# Patient Record
Sex: Male | Born: 1960
Health system: Southern US, Community
[De-identification: ages and names within clinical notes are randomized; demographics above are authoritative.]

## PROBLEM LIST (undated history)

## (undated) DIAGNOSIS — Z8776 Personal history of (corrected) congenital malformations of integument, limbs and musculoskeletal system: Secondary | ICD-10-CM

## (undated) DIAGNOSIS — E785 Hyperlipidemia, unspecified: Secondary | ICD-10-CM

## (undated) DIAGNOSIS — Z122 Encounter for screening for malignant neoplasm of respiratory organs: Secondary | ICD-10-CM

## (undated) DIAGNOSIS — F1010 Alcohol abuse, uncomplicated: Secondary | ICD-10-CM

## (undated) DIAGNOSIS — G4730 Sleep apnea, unspecified: Secondary | ICD-10-CM

## (undated) DIAGNOSIS — E875 Hyperkalemia: Secondary | ICD-10-CM

## (undated) DIAGNOSIS — M109 Gout, unspecified: Secondary | ICD-10-CM

## (undated) DIAGNOSIS — M1611 Unilateral primary osteoarthritis, right hip: Secondary | ICD-10-CM

## (undated) DIAGNOSIS — G4733 Obstructive sleep apnea (adult) (pediatric): Secondary | ICD-10-CM

## (undated) DIAGNOSIS — J439 Emphysema, unspecified: Secondary | ICD-10-CM

## (undated) DIAGNOSIS — K259 Gastric ulcer, unspecified as acute or chronic, without hemorrhage or perforation: Secondary | ICD-10-CM

## (undated) DIAGNOSIS — Z87768 Personal history of other specified (corrected) congenital malformations of integument, limbs and musculoskeletal system: Secondary | ICD-10-CM

## (undated) DIAGNOSIS — I340 Nonrheumatic mitral (valve) insufficiency: Secondary | ICD-10-CM

## (undated) DIAGNOSIS — E119 Type 2 diabetes mellitus without complications: Secondary | ICD-10-CM

## (undated) DIAGNOSIS — G8190 Hemiplegia, unspecified affecting unspecified side: Secondary | ICD-10-CM

## (undated) DIAGNOSIS — I10 Essential (primary) hypertension: Secondary | ICD-10-CM

## (undated) DIAGNOSIS — Z720 Tobacco use: Secondary | ICD-10-CM

## (undated) DIAGNOSIS — I4891 Unspecified atrial fibrillation: Secondary | ICD-10-CM

## (undated) DIAGNOSIS — R7303 Prediabetes: Secondary | ICD-10-CM

## (undated) DIAGNOSIS — M1990 Unspecified osteoarthritis, unspecified site: Secondary | ICD-10-CM

## (undated) DIAGNOSIS — G839 Paralytic syndrome, unspecified: Secondary | ICD-10-CM

## (undated) DIAGNOSIS — I428 Other cardiomyopathies: Secondary | ICD-10-CM

## (undated) DIAGNOSIS — I639 Cerebral infarction, unspecified: Secondary | ICD-10-CM

## (undated) DIAGNOSIS — I2510 Atherosclerotic heart disease of native coronary artery without angina pectoris: Secondary | ICD-10-CM

## (undated) HISTORY — DX: Other cardiomyopathies: I42.8

## (undated) HISTORY — DX: Tobacco use: Z72.0

## (undated) HISTORY — DX: Unilateral primary osteoarthritis, right hip: M16.11

## (undated) HISTORY — DX: Hyperkalemia: E87.5

## (undated) HISTORY — PX: OTHER SURGICAL HISTORY: SHX169

## (undated) HISTORY — DX: Type 2 diabetes mellitus without complications: E11.9

## (undated) HISTORY — PX: TRANSTHORACIC ECHOCARDIOGRAM: SHX275

## (undated) HISTORY — DX: Gastric ulcer, unspecified as acute or chronic, without hemorrhage or perforation: K25.9

## (undated) HISTORY — DX: Alcohol abuse, uncomplicated: F10.10

## (undated) HISTORY — DX: Gout, unspecified: M10.9

## (undated) HISTORY — DX: Atherosclerotic heart disease of native coronary artery without angina pectoris: I25.10

## (undated) HISTORY — PX: TONSILLECTOMY: SUR1361

## (undated) HISTORY — DX: Essential (primary) hypertension: I10

## (undated) HISTORY — DX: Personal history of other specified (corrected) congenital malformations of integument, limbs and musculoskeletal system: Z87.768

## (undated) HISTORY — PX: CARDIOVASCULAR STRESS TEST: SHX262

## (undated) HISTORY — DX: Hyperlipidemia, unspecified: E78.5

## (undated) HISTORY — DX: Unspecified atrial fibrillation: I48.91

## (undated) HISTORY — DX: Unspecified osteoarthritis, unspecified site: M19.90

## (undated) HISTORY — DX: Personal history of (corrected) congenital malformations of integument, limbs and musculoskeletal system: Z87.76

## (undated) HISTORY — DX: Encounter for screening for malignant neoplasm of respiratory organs: Z12.2

## (undated) HISTORY — DX: Obstructive sleep apnea (adult) (pediatric): G47.33

## (undated) HISTORY — DX: Hemiplegia, unspecified affecting unspecified side: G81.90

## (undated) HISTORY — DX: Nonrheumatic mitral (valve) insufficiency: I34.0

## (undated) HISTORY — DX: Emphysema, unspecified: J43.9

## (undated) HISTORY — PX: CARDIOVERSION: SHX1299

---
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
## 2013-01-10 HISTORY — PX: CARDIAC CATHETERIZATION: SHX172

## 2013-02-05 DIAGNOSIS — F1010 Alcohol abuse, uncomplicated: Secondary | ICD-10-CM | POA: Insufficient documentation

## 2013-02-05 DIAGNOSIS — R509 Fever, unspecified: Secondary | ICD-10-CM | POA: Insufficient documentation

## 2013-02-06 DIAGNOSIS — I071 Rheumatic tricuspid insufficiency: Secondary | ICD-10-CM | POA: Insufficient documentation

## 2013-02-06 DIAGNOSIS — I4891 Unspecified atrial fibrillation: Secondary | ICD-10-CM | POA: Insufficient documentation

## 2013-02-06 DIAGNOSIS — I428 Other cardiomyopathies: Secondary | ICD-10-CM | POA: Insufficient documentation

## 2013-02-24 DIAGNOSIS — I10 Essential (primary) hypertension: Secondary | ICD-10-CM | POA: Insufficient documentation

## 2013-02-24 DIAGNOSIS — D62 Acute posthemorrhagic anemia: Secondary | ICD-10-CM | POA: Insufficient documentation

## 2013-02-24 DIAGNOSIS — D649 Anemia, unspecified: Secondary | ICD-10-CM | POA: Insufficient documentation

## 2015-02-12 DIAGNOSIS — M1610 Unilateral primary osteoarthritis, unspecified hip: Secondary | ICD-10-CM | POA: Insufficient documentation

## 2015-03-19 DIAGNOSIS — E785 Hyperlipidemia, unspecified: Secondary | ICD-10-CM | POA: Insufficient documentation

## 2016-07-10 DIAGNOSIS — E119 Type 2 diabetes mellitus without complications: Secondary | ICD-10-CM

## 2016-07-10 DIAGNOSIS — E785 Hyperlipidemia, unspecified: Secondary | ICD-10-CM

## 2016-07-10 HISTORY — DX: Type 2 diabetes mellitus without complications: E11.9

## 2016-07-10 HISTORY — DX: Hyperlipidemia, unspecified: E78.5

## 2016-08-01 ENCOUNTER — Encounter: Payer: Self-pay | Admitting: Family Medicine

## 2016-08-01 ENCOUNTER — Other Ambulatory Visit (INDEPENDENT_AMBULATORY_CARE_PROVIDER_SITE_OTHER): Payer: BLUE CROSS/BLUE SHIELD

## 2016-08-01 ENCOUNTER — Other Ambulatory Visit: Payer: Self-pay | Admitting: Family Medicine

## 2016-08-01 ENCOUNTER — Ambulatory Visit (INDEPENDENT_AMBULATORY_CARE_PROVIDER_SITE_OTHER): Payer: BLUE CROSS/BLUE SHIELD | Admitting: Family Medicine

## 2016-08-01 VITALS — BP 152/92 | HR 78 | Temp 98.6°F | Resp 16 | Ht 67.0 in | Wt 196.5 lb

## 2016-08-01 DIAGNOSIS — Z125 Encounter for screening for malignant neoplasm of prostate: Secondary | ICD-10-CM

## 2016-08-01 DIAGNOSIS — Z0000 Encounter for general adult medical examination without abnormal findings: Secondary | ICD-10-CM

## 2016-08-01 DIAGNOSIS — I10 Essential (primary) hypertension: Secondary | ICD-10-CM

## 2016-08-01 DIAGNOSIS — F1010 Alcohol abuse, uncomplicated: Secondary | ICD-10-CM

## 2016-08-01 LAB — COMPREHENSIVE METABOLIC PANEL
ALT: 26 U/L (ref 0–53)
AST: 30 U/L (ref 0–37)
Albumin: 4.7 g/dL (ref 3.5–5.2)
Alkaline Phosphatase: 74 U/L (ref 39–117)
BUN: 11 mg/dL (ref 6–23)
CO2: 30 mEq/L (ref 19–32)
Calcium: 10.1 mg/dL (ref 8.4–10.5)
Chloride: 97 mEq/L (ref 96–112)
Creatinine, Ser: 0.84 mg/dL (ref 0.40–1.50)
GFR: 100.25 mL/min (ref >60.00)
Glucose, Bld: 139 mg/dL — ABNORMAL HIGH (ref 70–99)
Potassium: 5.1 mEq/L (ref 3.5–5.1)
Sodium: 139 mEq/L (ref 135–145)
Total Bilirubin: 0.9 mg/dL (ref 0.2–1.2)
Total Protein: 7.4 g/dL (ref 6.0–8.3)

## 2016-08-01 LAB — LIPID PANEL
Cholesterol: 207 mg/dL — ABNORMAL HIGH (ref 0–200)
HDL: 55.8 mg/dL (ref >39.00)
LDL Cholesterol: 129 mg/dL — ABNORMAL HIGH (ref 0–99)
NonHDL: 151.62
Total CHOL/HDL Ratio: 4
Triglycerides: 111 mg/dL (ref 0.0–149.0)
VLDL: 22.2 mg/dL (ref 0.0–40.0)

## 2016-08-01 LAB — CBC WITH DIFFERENTIAL/PLATELET
Basophils Absolute: 0 10*3/uL (ref 0.0–0.1)
Basophils Relative: 0.6 % (ref 0.0–3.0)
Eosinophils Absolute: 0.1 10*3/uL (ref 0.0–0.7)
Eosinophils Relative: 0.8 % (ref 0.0–5.0)
HCT: 49.6 % (ref 39.0–52.0)
Hemoglobin: 16.2 g/dL (ref 13.0–17.0)
Lymphocytes Relative: 24.1 % (ref 12.0–46.0)
Lymphs Abs: 1.6 10*3/uL (ref 0.7–4.0)
MCHC: 32.6 g/dL (ref 30.0–36.0)
MCV: 102.1 fl — ABNORMAL HIGH (ref 78.0–100.0)
Monocytes Absolute: 1 10*3/uL (ref 0.1–1.0)
Monocytes Relative: 14.3 % — ABNORMAL HIGH (ref 3.0–12.0)
Neutro Abs: 4.1 10*3/uL (ref 1.4–7.7)
Neutrophils Relative %: 60.2 % (ref 43.0–77.0)
Platelets: 215 10*3/uL (ref 150.0–400.0)
RBC: 4.86 Mil/uL (ref 4.22–5.81)
RDW: 13.5 % (ref 11.5–15.5)
WBC: 6.8 10*3/uL (ref 4.0–10.5)

## 2016-08-01 LAB — TSH: TSH: 1.72 u[IU]/mL (ref 0.35–4.50)

## 2016-08-01 LAB — PSA: PSA: 2.08 ng/mL (ref 0.10–4.00)

## 2016-08-01 MED ORDER — AMLODIPINE BESYLATE 10 MG PO TABS: 10.0000 mg | ORAL_TABLET | Freq: Every day | ORAL | 0 refills | Status: DC

## 2016-08-01 NOTE — Progress Notes (Signed)
Office Note 08/01/2016  CC:  Chief Complaint  Patient presents with  . Establish Care  . Annual Exam    Pt is fasting.     HPI:  Robert [name] is a 56 y.o. male who is here accompanied by wife to establish care Patient's most recent primary MD: Dr. Luetta Nutting at [facility].  Last visit 2017 per pt report today. Old records in EPIC/HL were reviewed prior to or during today's visit.  He has never had a colonoscopy.  He has a long history of alcohol abuse/alcoholism. He denies ever being in an alc rehab facility. He has never really quit alcohol completely. Never had w/drawals. He is not willing to quit drinking alcohol altogether. He wants to try to cut back a little at a time but doesn't want to get to the point of never drinking at all.  Says he drinks alcohol to "keep from going crazy from boredome"  Denies depression.   Denies any use of alcohol so far today.  Denies hx of any liver dysfunction.  He never checks his bp at home.  Denies any other bp meds in the past.  Currently taking toprol xl 100mg  bid and lisinopril40 mg qd. Has f/u with cardiologist in JAN 2018.  Past Medical History:  Diagnosis Date  . Alcohol abuse   . Arthritis   . Atrial fibrillation ([place])    Not candidate for anticoagulation b/c of GI bleeding; had to get transfused.  . Gastric ulcer    hx of  . Hypertension   . Nonischemic cardiomyopathy ([place])    resolved  . Nonrheumatic mitral valve regurgitation   . Osteoarthritis of right hip    Scheduled for surgery 05/11/15 but he then declined to get the surgery.    Past Surgical History:  Procedure Laterality Date  . CARDIOVASCULAR STRESS TEST    . CARDIOVERSION     DC  . TONSILLECTOMY      Family History  Problem Relation Age of Onset  . Diabetes Mother   . Congestive Heart Failure Mother   . Alcohol abuse Father   . Hypertension Father   . Stroke Father   . Heart disease Paternal Grandfather     Social History   Social History  .  Marital status: Married    Spouse name: N/A  . Number of children: N/A  . Years of education: N/A   Occupational History  . Not on file.   Social History Main Topics  . Smoking status: Former Smoker    Packs/day: 1.00    Years: 5.00    Types: Cigarettes    Quit date: 01/10/2013  . Smokeless tobacco: Never Used  . Alcohol use Yes     Comment: drinks liquor daily  . Drug use: No  . Sexual activity: Not on file   Other Topics Concern  . Not on file   Social History Narrative   Married, 2 adult children.   Educ: 11th grade   Occup: "out of work".  Worked for [employer]--was let go for not abiding by [employer] per pt/wife.   Tob: former--quit 2014.  "Of and on prior".   Alc: currently drinks a pint and 1 and 1/2 pint of whiskey per day x years---long hx of alcohol abuse.   Drugs: none.   MEDS: pt not taking amlodipine listed below--this med was added at today's visit. Outpatient Encounter Prescriptions as of 08/01/2016  Medication Sig  . digoxin (LANOXIN) 0.125 MG tablet Take 1 tablet by  mouth daily.  Marland Kitchen lisinopril (PRINIVIL,ZESTRIL) 40 MG tablet Take 1 tablet by mouth daily.  . metoprolol succinate (TOPROL-XL) 100 MG 24 hr tablet Take 1 tablet by mouth 2 (two) times daily.  Marland Kitchen amLODipine (NORVASC) 10 MG tablet Take 1 tablet (10 mg total) by mouth daily.   No facility-administered encounter medications on file as of 08/01/2016.     No Known Allergies  ROS Review of Systems  Constitutional: Negative for fatigue and fever.  HENT: Negative for congestion and sore throat.   Eyes: Negative for visual disturbance.  Respiratory: Negative for cough.   Cardiovascular: Negative for chest pain.  Gastrointestinal: Negative for abdominal pain and nausea.  Genitourinary: Negative for dysuria.  Musculoskeletal: Positive for arthralgias (chronic severe R hip pain). Negative for back pain and joint swelling.  Skin: Negative for rash.  Neurological: Negative for weakness and  headaches.       Hearing impairment   Hematological: Negative for adenopathy.    PE; Blood pressure (!) 152/92, pulse 78, temperature 98.6 F (37 C), temperature source Oral, resp. rate 16, height 5\' 7"  (1.702 m), weight 196 lb 8 oz (89.1 kg), SpO2 98 %.BP recheck manually 152/92 Gen: Alert, well appearing.  Patient is oriented to person, place, time, and situation. AFFECT: pleasant, lucid thought and speech. OEV:OJJK: no injection, icteris, swelling, or exudate.  EOMI, PERRLA. Mouth: lips without lesion/swelling.  Oral mucosa pink and moist. Oropharynx without erythema, exudate, or swelling.  CV: Irreg irreg, no m/r/g.   LUNGS: CTA bilat, nonlabored resps, good aeration in all lung fields. EXT: digital clubbing, no cyanosis, 1-2 + pitting L LE, no edema R LE  Pertinent labs:  none  ASSESSMENT AND PLAN:   New pt; obtain pertinent old records.  1) Uncontrolled HTN: add amlodipine 10mg  qd. Checking BMET and lipid panel today (fasting).  2) Alcoholism: pt with poor insight into his problem.  Refuses to quit, but agrees to try to cut back. Offered/encouraged help with quitting today, including getting him into rehab facility, referring him to psychiatrist, etc. He declined ANY such help today. Check CBC and CMET today (fasting).  3) Chronic R hip pain/severe osteoarthritis: disabled.  Hip replacement was recommended but he did not go through with this.  I wrote a letter today to excuse him from jury duty.  4) A-fib: vent response normal.  Not a candidate for anticoagulation b/c of past hx of severe GI bleed on xarelto PLUS pt with severe alcohol abuse and at risk for falls.  An After Visit Summary was printed and given to the patient.  Return in about 2 weeks (around 08/15/2016) for CPE and f/u HTN. (CPE labs done today).  Signed:  Crissie Sickles, MD           08/01/2016

## 2016-08-02 ENCOUNTER — Other Ambulatory Visit: Payer: Self-pay | Admitting: *Deleted

## 2016-08-02 ENCOUNTER — Encounter: Payer: Self-pay | Admitting: Family Medicine

## 2016-08-02 ENCOUNTER — Other Ambulatory Visit (INDEPENDENT_AMBULATORY_CARE_PROVIDER_SITE_OTHER): Payer: BLUE CROSS/BLUE SHIELD

## 2016-08-02 DIAGNOSIS — R739 Hyperglycemia, unspecified: Secondary | ICD-10-CM

## 2016-08-02 LAB — HEMOGLOBIN A1C: Hgb A1c MFr Bld: 6.2 % (ref 4.6–6.5)

## 2016-08-08 ENCOUNTER — Encounter: Payer: Self-pay | Admitting: Family Medicine

## 2016-08-08 ENCOUNTER — Other Ambulatory Visit (INDEPENDENT_AMBULATORY_CARE_PROVIDER_SITE_OTHER): Payer: BLUE CROSS/BLUE SHIELD

## 2016-08-08 DIAGNOSIS — R739 Hyperglycemia, unspecified: Secondary | ICD-10-CM | POA: Diagnosis not present

## 2016-08-08 LAB — GLUCOSE, RANDOM: Glucose, Bld: 137 mg/dL — ABNORMAL HIGH (ref 70–99)

## 2016-08-09 ENCOUNTER — Other Ambulatory Visit: Payer: Self-pay | Admitting: *Deleted

## 2016-08-09 MED ORDER — METFORMIN HCL 500 MG PO TABS: 500.0000 mg | ORAL_TABLET | Freq: Every day | ORAL | 0 refills | Status: DC

## 2016-08-15 ENCOUNTER — Ambulatory Visit (INDEPENDENT_AMBULATORY_CARE_PROVIDER_SITE_OTHER): Payer: BLUE CROSS/BLUE SHIELD | Admitting: Family Medicine

## 2016-08-15 ENCOUNTER — Encounter: Payer: Self-pay | Admitting: Family Medicine

## 2016-08-15 VITALS — BP 109/71 | HR 67 | Temp 98.1°F | Resp 16 | Ht 67.0 in | Wt 195.0 lb

## 2016-08-15 DIAGNOSIS — R683 Clubbing of fingers: Secondary | ICD-10-CM | POA: Diagnosis not present

## 2016-08-15 DIAGNOSIS — Z1211 Encounter for screening for malignant neoplasm of colon: Secondary | ICD-10-CM

## 2016-08-15 DIAGNOSIS — Z87891 Personal history of nicotine dependence: Secondary | ICD-10-CM

## 2016-08-15 DIAGNOSIS — E118 Type 2 diabetes mellitus with unspecified complications: Secondary | ICD-10-CM

## 2016-08-15 DIAGNOSIS — Z0000 Encounter for general adult medical examination without abnormal findings: Secondary | ICD-10-CM

## 2016-08-15 DIAGNOSIS — E119 Type 2 diabetes mellitus without complications: Secondary | ICD-10-CM | POA: Diagnosis not present

## 2016-08-15 DIAGNOSIS — Z125 Encounter for screening for malignant neoplasm of prostate: Secondary | ICD-10-CM

## 2016-08-15 MED ORDER — BLOOD GLUCOSE METER KIT: PACK | 0 refills | Status: DC

## 2016-08-15 NOTE — Patient Instructions (Signed)

## 2016-08-15 NOTE — Progress Notes (Signed)
Office Note 08/15/2016  CC:  Chief Complaint  Patient presents with  . Annual Exam    Pt is fasting.   . Follow-up    HTN    HPI:  Robert [name] is a 56 y.o. male who is here for f/u HTN, recent dx of DM 2 at most recent visit 07/2016, as well as annual health maintenance exam. After labs last visit, I recommended nutritionist referral but he declined. I also started him on metformin 500 mg qhs.   I also added amlodipine to his bp med regimen last visit. No home bp checks have been done.  Needs equipment for home glucose monitoring.  Taking metformin every night w/out side effect.  Eyes:  approx 2 yrs ago. Dental: no preventatives. Exercise: no exercise, esp with R hip pain. Diet: trying to watch carbs some.   Past Medical History:  Diagnosis Date  . Alcohol abuse   . Arthritis   . Atrial fibrillation ([place])    Not candidate for anticoagulation b/c of GI bleeding; had to get transfused.  Holding off on ASA until patient quits drinking.  . Gastric ulcer    hx of  . Hyperlipidemia 07/2016   Holding off on statin until pt quits drinking.  Marland Kitchen Hypertension   . Nonischemic cardiomyopathy ([place])    resolved  . Nonrheumatic mitral valve regurgitation   . Osteoarthritis of right hip    Scheduled for surgery 05/11/15 but he then declined to get the surgery.  . Type II diabetes mellitus ([place]) 07/2016   Dx'd by two fasting glucoses > 126 (Hb a1c 6.2% at that time).  Holding off on ASA until patient quits drinking.    Past Surgical History:  Procedure Laterality Date  . CARDIOVASCULAR STRESS TEST    . CARDIOVERSION     DC  . TONSILLECTOMY      Family History  Problem Relation Age of Onset  . Diabetes Mother   . Congestive Heart Failure Mother   . Alcohol abuse Father   . Hypertension Father   . Stroke Father   . Heart disease Paternal Grandfather     Social History   Social History  . Marital status: Married    Spouse name: N/A  . Number of children: N/A  .  Years of education: N/A   Occupational History  . Not on file.   Social History Main Topics  . Smoking status: Former Smoker    Packs/day: 1.00    Years: 5.00    Types: Cigarettes    Quit date: 01/10/2013  . Smokeless tobacco: Never Used  . Alcohol use Yes     Comment: drinks liquor daily  . Drug use: No  . Sexual activity: Not on file   Other Topics Concern  . Not on file   Social History Narrative   Married, 2 adult children.   Educ: 11th grade   Occup: "out of work".  Worked for [employer]--was let go for not abiding by [employer] per pt/wife.   Tob: former--quit 2014.  "Of and on prior".   Alc: currently drinks a pint and 1 and 1/2 pint of whiskey per day x years---long hx of alcohol abuse.   Drugs: none.    Outpatient Medications Prior to Visit  Medication Sig Dispense Refill  . amLODipine (NORVASC) 10 MG tablet Take 1 tablet (10 mg total) by mouth daily. 30 tablet 0  . digoxin (LANOXIN) 0.125 MG tablet Take 1 tablet by mouth daily.    Marland Kitchen  lisinopril (PRINIVIL,ZESTRIL) 40 MG tablet Take 1 tablet by mouth daily.    . metFORMIN (GLUCOPHAGE) 500 MG tablet Take 1 tablet (500 mg total) by mouth at bedtime. 90 tablet 0  . metoprolol succinate (TOPROL-XL) 100 MG 24 hr tablet Take 1 tablet by mouth 2 (two) times daily.     No facility-administered medications prior to visit.     No Known Allergies  ROS Review of Systems  Constitutional: Negative for appetite change, chills, fatigue and fever.  HENT: Negative for congestion, dental problem, ear pain and sore throat.   Eyes: Negative for discharge, redness and visual disturbance.  Respiratory: Negative for cough, chest tightness, shortness of breath and wheezing.   Cardiovascular: Negative for chest pain, palpitations and leg swelling.  Gastrointestinal: Positive for constipation (chronic). Negative for abdominal pain, blood in stool, diarrhea, nausea and vomiting.  Genitourinary: Negative for difficulty urinating,  dysuria, flank pain, frequency, hematuria and urgency.  Musculoskeletal: Negative for arthralgias, back pain, joint swelling, myalgias and neck stiffness.  Skin: Negative for pallor and rash.  Neurological: Negative for dizziness, speech difficulty, weakness and headaches.  Hematological: Negative for adenopathy. Does not bruise/bleed easily.  Psychiatric/Behavioral: Negative for confusion and sleep disturbance. The patient is not nervous/anxious.     PE; Blood pressure 109/71, pulse 67, temperature 98.1 F (36.7 C), temperature source Oral, resp. rate 16, height 5\' 7"  (1.702 m), weight 195 lb (88.5 kg), SpO2 98 %. Gen: Alert, well appearing.  Patient is oriented to person, place, time, and situation. AFFECT: pleasant, lucid thought and speech. ENT: Ears: EACs clear, normal epithelium.  TMs with good light reflex and landmarks bilaterally.  Eyes: no injection, icteris, swelling, or exudate.  EOMI, PERRLA. Nose: no drainage or turbinate edema/swelling.  No injection or focal lesion.  Mouth: lips without lesion/swelling.  Oral mucosa pink and moist.  Dentition intact and without obvious caries or gingival swelling.  Oropharynx without erythema, exudate, or swelling.  Neck: supple/nontender.  No LAD, mass, or TM.  Carotid pulses 2+ bilaterally, without bruits. CV: RRR, no m/r/g.   LUNGS: CTA bilat, nonlabored resps, good aeration in all lung fields. ABD: soft, NT, ND, BS normal.  No hepatospenomegaly or mass.  No bruits. EXT: Digital clubbing on all fingers.  Clubbing,No cyanosis or edema.  Musculoskeletal: no joint swelling, erythema, warmth, or tenderness.  ROM of all joints intact. Skin - no sores or suspicious lesions or rashes or color changes   Pertinent labs:  Lab Results  Component Value Date   TSH 1.72 08/01/2016   Lab Results  Component Value Date   WBC 6.8 08/01/2016   HGB 16.2 08/01/2016   HCT 49.6 08/01/2016   MCV 102.1 (H) 08/01/2016   PLT 215.0 08/01/2016   Lab  Results  Component Value Date   CREATININE 0.84 08/01/2016   BUN 11 08/01/2016   NA 139 08/01/2016   K 5.1 08/01/2016   CL 97 08/01/2016   CO2 30 08/01/2016   Lab Results  Component Value Date   ALT 26 08/01/2016   AST 30 08/01/2016   ALKPHOS 74 08/01/2016   BILITOT 0.9 08/01/2016   Lab Results  Component Value Date   CHOL 207 (H) 08/01/2016   Lab Results  Component Value Date   HDL 55.80 08/01/2016   Lab Results  Component Value Date   LDLCALC 129 (H) 08/01/2016   Lab Results  Component Value Date   TRIG 111.0 08/01/2016   Lab Results  Component Value Date   CHOLHDL 4  08/01/2016   Lab Results  Component Value Date   PSA 2.08 08/01/2016   Lab Results  Component Value Date   HGBA1C 6.2 08/02/2016     ASSESSMENT AND PLAN:   1) DM 2; new dx.  Tolerating low dose metformin. He declined nutritionist referral. Discussed diet minimally today. Rx's given for glucometer and strips/supplies today and CMA Helayne Seminole taught him how to check his glucose while in room today.  2) HTN: improved since addition of amlodipine last visit--normal here today. Discussed home monitoring--will likely tackle this at future visit b/c he has a lot of new things on his plate right now.  3) Health maintenance exam:  Reviewed age and gender appropriate health maintenance issues (prudent diet, regular exercise, health risks of tobacco and excessive alcohol, use of seatbelts, fire alarms in home, use of sunscreen).  Also reviewed age and gender appropriate health screening as well as vaccine recommendations. Encouraged pt to completely quit drinking alcohol. Vaccines: needs Tdap--declines this today. Labs: done at last visit--see above.  Hep C and HIV screening--he declined these today. Prostate ca screening: PSA normal recently.  He wants to defer the DRE at this time. Colon ca screening: pt wants to wait on doing anything regarding colon ca screening at this time.  An After  Visit Summary was printed and given to the patient.  FOLLOW UP:  Return in about 3 months (around 11/15/2016) for routine chronic illness f/u (fasting).  Signed:  Crissie Sickles, MD           08/15/2016

## 2016-08-20 ENCOUNTER — Ambulatory Visit (HOSPITAL_BASED_OUTPATIENT_CLINIC_OR_DEPARTMENT_OTHER)
Admission: RE | Admit: 2016-08-20 | Discharge: 2016-08-20 | Disposition: A | Discharge status: Home / Self Care | Payer: BLUE CROSS/BLUE SHIELD | Source: Ambulatory Visit | Attending: Family Medicine | Admitting: Family Medicine

## 2016-08-20 DIAGNOSIS — Z87891 Personal history of nicotine dependence: Secondary | ICD-10-CM | POA: Insufficient documentation

## 2016-08-20 DIAGNOSIS — R683 Clubbing of fingers: Secondary | ICD-10-CM | POA: Insufficient documentation

## 2016-08-21 ENCOUNTER — Encounter: Payer: Self-pay | Admitting: Family Medicine

## 2016-08-28 ENCOUNTER — Other Ambulatory Visit: Payer: Self-pay | Admitting: Family Medicine

## 2016-08-29 NOTE — Telephone Encounter (Signed)
[facility] [address] 

## 2016-10-07 ENCOUNTER — Other Ambulatory Visit: Payer: Self-pay | Admitting: *Deleted

## 2016-10-07 MED ORDER — GLUCOSE BLOOD VI STRP: ORAL_STRIP | 12 refills | Status: DC

## 2016-11-05 ENCOUNTER — Other Ambulatory Visit: Payer: Self-pay | Admitting: Family Medicine

## 2016-11-15 ENCOUNTER — Ambulatory Visit: Payer: BLUE CROSS/BLUE SHIELD | Admitting: Family Medicine

## 2016-11-15 ENCOUNTER — Other Ambulatory Visit: Payer: Self-pay | Admitting: *Deleted

## 2016-11-15 MED ORDER — MICROLET LANCETS MISC: 11 refills | Status: DC

## 2017-01-20 ENCOUNTER — Encounter: Payer: Self-pay | Admitting: Family Medicine

## 2017-01-20 ENCOUNTER — Ambulatory Visit: Payer: BLUE CROSS/BLUE SHIELD | Admitting: Family Medicine

## 2017-01-20 VITALS — BP 120/79 | HR 66 | Temp 98.2°F | Resp 16 | Wt 186.0 lb

## 2017-01-20 DIAGNOSIS — F1010 Alcohol abuse, uncomplicated: Secondary | ICD-10-CM | POA: Diagnosis not present

## 2017-01-20 DIAGNOSIS — I10 Essential (primary) hypertension: Secondary | ICD-10-CM

## 2017-01-20 DIAGNOSIS — E7800 Pure hypercholesterolemia, unspecified: Secondary | ICD-10-CM

## 2017-01-20 DIAGNOSIS — Z23 Encounter for immunization: Secondary | ICD-10-CM

## 2017-01-20 DIAGNOSIS — E118 Type 2 diabetes mellitus with unspecified complications: Secondary | ICD-10-CM | POA: Diagnosis not present

## 2017-01-20 LAB — BASIC METABOLIC PANEL
BUN: 19 mg/dL (ref 6–23)
CO2: 31 mEq/L (ref 19–32)
Calcium: 9.9 mg/dL (ref 8.4–10.5)
Chloride: 98 mEq/L (ref 96–112)
Creatinine, Ser: 0.91 mg/dL (ref 0.40–1.50)
GFR: 91.25 mL/min (ref >60.00)
Glucose, Bld: 131 mg/dL — ABNORMAL HIGH (ref 70–99)
Potassium: 5.1 mEq/L (ref 3.5–5.1)
Sodium: 139 mEq/L (ref 135–145)

## 2017-01-20 LAB — MICROALBUMIN / CREATININE URINE RATIO
Creatinine,U: 77.2 mg/dL
Microalb Creat Ratio: 1.8 mg/g (ref 0.0–30.0)
Microalb, Ur: 1.4 mg/dL (ref 0.0–1.9)

## 2017-01-20 LAB — HEMOGLOBIN A1C: Hgb A1c MFr Bld: 6.1 % (ref 4.6–6.5)

## 2017-01-20 NOTE — Progress Notes (Signed)
OFFICE VISIT  01/20/2017   CC:  Chief Complaint  Patient presents with  . Follow-up    RCI   HPI:    Patient is a 57 y.o.  male who presents for 5 mo f/u DM 2, HTN, HLD, alcohol abuse.  He is by himself today.  Noncompliant with home gluc monitoring: "normal" per pt. Denies burning,tingling, or numbness in feet.  Denies ulcers. He refused feet exam today b/c he said it was too difficult and painful to take his shoes and socks off due to chronic hip pain.  He did not even want me to assist him.  HTN: no home bp monitoring.  Reports good compliance with bp meds.  Alcohol: none during the week.  Drinks 2 pints of canadian mist on weekends. Drinks lots of orange juice and water.  NO sodas. No exercise. R hip pain prevents much activity at all.  He is resistant to the idea of hip replacement surgery.  HLD: he is not really interested in taking statin, and I told him we really can't even safely give him this med anyway since he drinks too much alcohol.  If he ever quits alcohol completely then we could offer statin AND offer anticoag for his a-fib.  ROS: +LE swelling--chronic.  NO CP, no SOB, no melena or hematochezia, no HAs, no palpitations, no heart racing, no dizziness.  No focal weakness.  Past Medical History:  Diagnosis Date  . Alcohol abuse    ongoing as of 01/2017  . Arthritis   . Atrial fibrillation ([place])    Not candidate for anticoagulation b/c of GI bleeding; had to get transfused.  Holding off on ASA until patient quits drinking.  . Gastric ulcer    hx of  . History of digital clubbing    "all my adult life"--CXR 08/2016 = bronchitic changes o/w normal.  . Hyperlipidemia 07/2016   Holding off on statin until pt quits drinking.  Marland Kitchen Hypertension   . Nonischemic cardiomyopathy ([place])    resolved  . Nonrheumatic mitral valve regurgitation   . Osteoarthritis of right hip    Scheduled for surgery 05/11/15 but he then declined to get the surgery.  . Tobacco abuse    Quit  2014  . Type II diabetes mellitus ([place]) 07/2016   Dx'd by two fasting glucoses > 126 (Hb a1c 6.2% at that time).  Holding off on ASA until patient quits drinking.    Past Surgical History:  Procedure Laterality Date  . CARDIOVASCULAR STRESS TEST    . CARDIOVERSION     DC  . TONSILLECTOMY      Outpatient Medications Prior to Visit  Medication Sig Dispense Refill  . amLODipine (NORVASC) 10 MG tablet TAKE 1 TABLET BY MOUTH EVERY DAY 30 tablet 5  . blood glucose meter kit and supplies Use to check blood sugar once daily. 1 each 0  . digoxin (LANOXIN) 0.125 MG tablet Take 1 tablet by mouth daily.    Marland Kitchen glucose blood (CONTOUR NEXT TEST) test strip Use to check blood sugar once daily. 100 each 12  . lisinopril (PRINIVIL,ZESTRIL) 40 MG tablet Take 1 tablet by mouth daily.    . metFORMIN (GLUCOPHAGE) 500 MG tablet TAKE 1 TABLET AT BEDTIME 90 tablet 0  . metoprolol succinate (TOPROL-XL) 100 MG 24 hr tablet Take 1 tablet by mouth 2 (two) times daily.    Marland Kitchen MICROLET LANCETS MISC Use to check blood sugar once daily 100 each 11  . pantoprazole (PROTONIX) 40 MG tablet  Take 40 mg by mouth 2 (two) times daily.  0   No facility-administered medications prior to visit.     No Known Allergies  ROS As per HPI  PE: Blood pressure 120/79, pulse 66, temperature 98.2 F (36.8 C), temperature source Oral, resp. rate 16, weight 186 lb (84.4 kg), SpO2 98 %. Gen: Alert, well appearing.  Patient is oriented to person, place, time, and situation. AFFECT: pleasant, lucid thought and speech. CV: RRR, no m/r/g.   LUNGS: CTA bilat, nonlabored resps, good aeration in all lung fields. EXT: 3+ pitting edema L LL and 2+ pitting edema R LL. Digital clubbing on all fingers.  NO cyanosis.  LABS:  Lab Results  Component Value Date   TSH 1.72 08/01/2016   Lab Results  Component Value Date   WBC 6.8 08/01/2016   HGB 16.2 08/01/2016   HCT 49.6 08/01/2016   MCV 102.1 (H) 08/01/2016   PLT 215.0 08/01/2016    Lab Results  Component Value Date   CREATININE 0.84 08/01/2016   BUN 11 08/01/2016   NA 139 08/01/2016   K 5.1 08/01/2016   CL 97 08/01/2016   CO2 30 08/01/2016   Lab Results  Component Value Date   ALT 26 08/01/2016   AST 30 08/01/2016   ALKPHOS 74 08/01/2016   BILITOT 0.9 08/01/2016   Lab Results  Component Value Date   CHOL 207 (H) 08/01/2016   Lab Results  Component Value Date   HDL 55.80 08/01/2016   Lab Results  Component Value Date   LDLCALC 129 (H) 08/01/2016   Lab Results  Component Value Date   TRIG 111.0 08/01/2016   Lab Results  Component Value Date   CHOLHDL 4 08/01/2016   Lab Results  Component Value Date   PSA 2.08 08/01/2016   Lab Results  Component Value Date   HGBA1C 6.2 08/02/2016    IMPRESSION AND PLAN:  1) DM 2; no home monitoring. Reports compliance with metformin but sounds like diet not so good. He denied any feet symptoms but he refused feet exam today. HbA1c and urine microalb/cr today. Reminded pt of need for diab retpthy screening exam. Needs flu and pneumovax: he was agreeable to getting these but not today. He got shingrix #1 today and when he returns in 2 mo for shingrix #2 he said he would get flu vaccine at that time.  This is the best I could get him to do.  He will consider pneumovax 23 at a future office f/u visit.  2) HTN: The current medical regimen is effective;  continue present plan and medications. Lytes/cr today.  3) HLD: LDL 129 July 2018, plus pt is diabetic: LDL goal <70/statin indicated. However, statin risk outweighs benefits since he abuses alcohol.  4) Alcohol abuse: he abuses alc on weekends but states his wife won't let him drink during the week. Encouraged pt to quit drinking completely.  5) Digital clubbing: CXR last visit showed some chronic bronchitic changes but o/w normal.  An After Visit Summary was printed and given to the patient.  FOLLOW UP: Return in about 3 months (around 04/20/2017)  for routine chronic illness f/u  --nurse visit in 2 months to get shingrix #2 AND flu vaccine.  Signed:  Crissie Sickles, MD           01/20/2017

## 2017-02-08 ENCOUNTER — Other Ambulatory Visit: Payer: Self-pay | Admitting: Family Medicine

## 2017-03-20 ENCOUNTER — Ambulatory Visit (INDEPENDENT_AMBULATORY_CARE_PROVIDER_SITE_OTHER): Payer: BLUE CROSS/BLUE SHIELD

## 2017-03-20 DIAGNOSIS — Z23 Encounter for immunization: Secondary | ICD-10-CM

## 2017-03-20 NOTE — Progress Notes (Signed)
Patient presents today for #2 shingles vaccine and influenza vaccine. Given with no problems or incidence. Patient left with no complaints.

## 2017-04-10 DIAGNOSIS — E875 Hyperkalemia: Secondary | ICD-10-CM

## 2017-04-10 HISTORY — DX: Hyperkalemia: E87.5

## 2017-04-20 ENCOUNTER — Ambulatory Visit: Payer: BLUE CROSS/BLUE SHIELD | Admitting: Family Medicine

## 2017-04-20 ENCOUNTER — Encounter: Payer: Self-pay | Admitting: Family Medicine

## 2017-04-20 VITALS — BP 120/80 | HR 61 | Temp 97.5°F | Resp 16 | Ht 67.0 in | Wt 182.0 lb

## 2017-04-20 DIAGNOSIS — I10 Essential (primary) hypertension: Secondary | ICD-10-CM | POA: Diagnosis not present

## 2017-04-20 DIAGNOSIS — Z9181 History of falling: Secondary | ICD-10-CM | POA: Diagnosis not present

## 2017-04-20 DIAGNOSIS — E7800 Pure hypercholesterolemia, unspecified: Secondary | ICD-10-CM | POA: Diagnosis not present

## 2017-04-20 DIAGNOSIS — F1010 Alcohol abuse, uncomplicated: Secondary | ICD-10-CM | POA: Diagnosis not present

## 2017-04-20 DIAGNOSIS — E118 Type 2 diabetes mellitus with unspecified complications: Secondary | ICD-10-CM

## 2017-04-20 LAB — BASIC METABOLIC PANEL
BUN: 15 mg/dL (ref 6–23)
CO2: 33 mEq/L — ABNORMAL HIGH (ref 19–32)
Calcium: 9.6 mg/dL (ref 8.4–10.5)
Chloride: 100 mEq/L (ref 96–112)
Creatinine, Ser: 1.19 mg/dL (ref 0.40–1.50)
GFR: 66.89 mL/min (ref >60.00)
Glucose, Bld: 110 mg/dL — ABNORMAL HIGH (ref 70–99)
Potassium: 4.5 mEq/L (ref 3.5–5.1)
Sodium: 141 mEq/L (ref 135–145)

## 2017-04-20 LAB — HEMOGLOBIN A1C: Hgb A1c MFr Bld: 5.9 % (ref 4.6–6.5)

## 2017-04-20 NOTE — Progress Notes (Signed)
OFFICE VISIT  04/20/2017   CC:  Chief Complaint  Patient presents with  . Follow-up    RCI   HPI:    Patient is a 57 y.o. Caucasian male who presents unaccompanied today for 3 mo f/u DM 2, HTN, and alcohol abuse. Stopped amlodipine 1 mo ago b/c he ran out and didn't ask pharmacy for refill.  DM 2: has glucometer at home but doesn't check glucose. Has made some dietary adjustments. Says he has no feet burning, tingling, or numbness.  No hx of ulcer. Declined feet exam again today.  HTN: no home bp checks.  Compliant with lisin and toprol  Alcohol: drinks 1-2 pints of bourbon on w/e's, none weekdays.  He is a bit evasive about this topic. He states again that he realizes this is too much alcohol but says he'll never completely quit--does not want to. He has a-fib --not anticoag candidate due to unpredictable alcohol abuse+ fall risk.   Hx of hyperlipidemia---waiting on pt to stop drinking before putting him on a statin-->risk of liver injury. ROS: no palpitations, SOB, CP, no falls per pt.  Past Medical History:  Diagnosis Date  . Alcohol abuse    ongoing as of 01/2017  . Arthritis   . Atrial fibrillation ([place])    Not candidate for anticoagulation b/c of GI bleeding; had to get transfused.  Holding off on ASA until patient quits drinking.  . Gastric ulcer    hx of  . History of digital clubbing    "all my adult life"--CXR 08/2016 = bronchitic changes o/w normal.  . Hyperlipidemia 07/2016   Holding off on statin until pt quits drinking.  Marland Kitchen Hypertension   . Nonischemic cardiomyopathy ([place])    resolved  . Nonrheumatic mitral valve regurgitation   . Osteoarthritis of right hip    Scheduled for surgery 05/11/15 but he then declined to get the surgery.  . Tobacco abuse    Quit 2014  . Type II diabetes mellitus ([place]) 07/2016   Dx'd by two fasting glucoses > 126 (Hb a1c 6.2% at that time).  Holding off on ASA until patient quits drinking.    Past Surgical History:  Procedure  Laterality Date  . CARDIOVASCULAR STRESS TEST    . CARDIOVERSION     DC  . TONSILLECTOMY      Outpatient Medications Prior to Visit  Medication Sig Dispense Refill  . blood glucose meter kit and supplies Use to check blood sugar once daily. 1 each 0  . digoxin (LANOXIN) 0.125 MG tablet Take 1 tablet by mouth daily.    Marland Kitchen glucose blood (CONTOUR NEXT TEST) test strip Use to check blood sugar once daily. 100 each 12  . lisinopril (PRINIVIL,ZESTRIL) 40 MG tablet Take 1 tablet by mouth daily.    . metFORMIN (GLUCOPHAGE) 500 MG tablet TAKE 1 TABLET AT BEDTIME 90 tablet 1  . metoprolol succinate (TOPROL-XL) 100 MG 24 hr tablet Take 1 tablet by mouth 2 (two) times daily.    Marland Kitchen MICROLET LANCETS MISC Use to check blood sugar once daily 100 each 11  . pantoprazole (PROTONIX) 40 MG tablet Take 40 mg by mouth 2 (two) times daily.  0  . amLODipine (NORVASC) 10 MG tablet TAKE 1 TABLET BY MOUTH EVERY DAY (Patient not taking: Reported on 04/20/2017) 30 tablet 5   No facility-administered medications prior to visit.     No Known Allergies  ROS As per HPI  PE: Blood pressure 120/80, pulse 61, temperature (!) 97.5 F (  36.4 C), temperature source Oral, resp. rate 16, height _0  (1.702 m), weight 182 lb (82.6 kg), SpO2 98 %. Gen: Alert, well appearing.  Patient is oriented to person, place, time, and situation. AFFECT: pleasant, lucid thought and speech. CV: irreg irreg, no m/r Chest is clear, no wheezing or rales. Normal symmetric air entry throughout both lung fields. No chest wall deformities or tenderness. EXT: no cyanosis.  He has 1+ pitting edema in RLL, 2+ in L LL.  LABS:  Lab Results  Component Value Date   TSH 1.72 08/01/2016   Lab Results  Component Value Date   WBC 6.8 08/01/2016   HGB 16.2 08/01/2016   HCT 49.6 08/01/2016   MCV 102.1 (H) 08/01/2016   PLT 215.0 08/01/2016   Lab Results  Component Value Date   CREATININE 0.91 01/20/2017   BUN 19 01/20/2017   NA 139 01/20/2017    K 5.1 01/20/2017   CL 98 01/20/2017   CO2 31 01/20/2017   Lab Results  Component Value Date   ALT 26 08/01/2016   AST 30 08/01/2016   ALKPHOS 74 08/01/2016   BILITOT 0.9 08/01/2016   Lab Results  Component Value Date   CHOL 207 (H) 08/01/2016   Lab Results  Component Value Date   HDL 55.80 08/01/2016   Lab Results  Component Value Date   LDLCALC 129 (H) 08/01/2016   Lab Results  Component Value Date   TRIG 111.0 08/01/2016   Lab Results  Component Value Date   CHOLHDL 4 08/01/2016   Lab Results  Component Value Date   PSA 2.08 08/01/2016   Lab Results  Component Value Date   HGBA1C 6.1 01/20/2017    IMPRESSION AND PLAN:  1) DM 2, historically well controlled.  Diet improved.   Compliant with metformin. HbA1c today. He declined feet exam today---he is asymptomatic.  2) HTN: seems to be fine w/out amlodipine at this time. Continue lisinopril and toprol at current doses. BMET today.  3) Alc abuse/alcoholism--pt not willing to quit.  He admits only to weekend drinking. Will continue to hold off on anticoag for his a-fib as well as statin for his hypercholesterolemia until I feel like he is no longer abusing alcohol and a high fall risk. Unfortunately he tells me today he'll never quit.  An After Visit Summary was printed and given to the patient.  FOLLOW UP: No follow-ups on file.  Signed:  Crissie Sickles, MD           04/23/2017

## 2017-04-23 ENCOUNTER — Encounter: Payer: Self-pay | Admitting: Family Medicine

## 2017-04-24 ENCOUNTER — Other Ambulatory Visit: Payer: Self-pay | Admitting: *Deleted

## 2017-04-24 DIAGNOSIS — N182 Chronic kidney disease, stage 2 (mild): Secondary | ICD-10-CM

## 2017-05-04 ENCOUNTER — Telehealth: Payer: Self-pay | Admitting: Family Medicine

## 2017-05-04 NOTE — Telephone Encounter (Signed)
Patient questioning directions from previous lab results. Labs were reiterated and gone over. Patient voiced understanding.

## 2017-05-04 NOTE — Telephone Encounter (Signed)
Copied from [place] [phone]. Topic: Quick Communication - See Telephone Encounter >> May 04, 2017  3:34 PM Ether Griffins B wrote: CRM for notification. See Telephone encounter for: 05/04/17.  Pt has questions about his lab work and kidney function before his appt on Monday. He is requesting Dr. Anitra Lauth call him at [phone].

## 2017-05-08 ENCOUNTER — Other Ambulatory Visit (INDEPENDENT_AMBULATORY_CARE_PROVIDER_SITE_OTHER): Payer: BLUE CROSS/BLUE SHIELD

## 2017-05-08 DIAGNOSIS — N182 Chronic kidney disease, stage 2 (mild): Secondary | ICD-10-CM | POA: Diagnosis not present

## 2017-05-08 LAB — BASIC METABOLIC PANEL
BUN: 17 mg/dL (ref 6–23)
CO2: 35 mEq/L — ABNORMAL HIGH (ref 19–32)
Calcium: 9.6 mg/dL (ref 8.4–10.5)
Chloride: 105 mEq/L (ref 96–112)
Creatinine, Ser: 1.22 mg/dL (ref 0.40–1.50)
GFR: 64.99 mL/min (ref >60.00)
Glucose, Bld: 96 mg/dL (ref 70–99)
Potassium: 5.8 mEq/L — ABNORMAL HIGH (ref 3.5–5.1)
Sodium: 146 mEq/L — ABNORMAL HIGH (ref 135–145)

## 2017-05-09 ENCOUNTER — Other Ambulatory Visit: Payer: Self-pay | Admitting: Family Medicine

## 2017-05-09 MED ORDER — FUROSEMIDE 20 MG PO TABS: ORAL_TABLET | ORAL | 0 refills | Status: DC

## 2017-05-10 ENCOUNTER — Other Ambulatory Visit: Payer: Self-pay | Admitting: *Deleted

## 2017-05-10 ENCOUNTER — Telehealth: Payer: Self-pay | Admitting: Family Medicine

## 2017-05-10 DIAGNOSIS — N182 Chronic kidney disease, stage 2 (mild): Secondary | ICD-10-CM

## 2017-05-10 NOTE — Telephone Encounter (Signed)
Left message to return call to the office for instructions on how to take medication.

## 2017-05-10 NOTE — Telephone Encounter (Signed)
Copied from [place] [id number]. Topic: Quick Communication - Rx Refill/Question >> May 10, 2017  5:24 PM Robert [name] wrote: Medication: metoprolol succinate (TOPROL-XL) 100 MG 24 hr tablet [[id number]] , lisinopril (PRINIVIL,ZESTRIL) 40 MG tablet [[id number]]   Pt called to ask a nurse about the Rx's; pt states he was told by his wife after she spoke w/ a nurse that he is suppose to take half on one of these meds up top but doesn't know which one, call pt to advise

## 2017-05-10 NOTE — Telephone Encounter (Signed)
Copied from [place]. Topic: Quick Communication - Lab Results >> May 09, 2017  1:04 PM Onalee Hua, CMA wrote: See lab result note dated 05/08/17. Result note has been sent to [facility] NR Triage.  Okay for PEC to discuss results/PCP recommendations.   Wife called to get results. Please contact.

## 2017-05-10 NOTE — Telephone Encounter (Signed)
See result note.  

## 2017-05-11 NOTE — Telephone Encounter (Signed)
Patient's wife instructed by [place] 05/10/17.

## 2017-05-19 ENCOUNTER — Encounter: Payer: Self-pay | Admitting: Family Medicine

## 2017-05-19 ENCOUNTER — Other Ambulatory Visit (INDEPENDENT_AMBULATORY_CARE_PROVIDER_SITE_OTHER): Payer: BLUE CROSS/BLUE SHIELD

## 2017-05-19 DIAGNOSIS — N182 Chronic kidney disease, stage 2 (mild): Secondary | ICD-10-CM

## 2017-05-19 LAB — BASIC METABOLIC PANEL
BUN: 13 mg/dL (ref 6–23)
CO2: 34 mEq/L — ABNORMAL HIGH (ref 19–32)
Calcium: 9.2 mg/dL (ref 8.4–10.5)
Chloride: 101 mEq/L (ref 96–112)
Creatinine, Ser: 1.04 mg/dL (ref 0.40–1.50)
GFR: 78.13 mL/min (ref >60.00)
Glucose, Bld: 107 mg/dL — ABNORMAL HIGH (ref 70–99)
Potassium: 4.4 mEq/L (ref 3.5–5.1)
Sodium: 143 mEq/L (ref 135–145)

## 2017-07-31 ENCOUNTER — Ambulatory Visit (INDEPENDENT_AMBULATORY_CARE_PROVIDER_SITE_OTHER): Payer: BLUE CROSS/BLUE SHIELD | Admitting: Family Medicine

## 2017-07-31 ENCOUNTER — Encounter: Payer: Self-pay | Admitting: Family Medicine

## 2017-07-31 VITALS — BP 132/85 | HR 81 | Temp 98.7°F | Resp 16 | Ht 68.0 in | Wt 184.5 lb

## 2017-07-31 DIAGNOSIS — E119 Type 2 diabetes mellitus without complications: Secondary | ICD-10-CM

## 2017-07-31 DIAGNOSIS — I4891 Unspecified atrial fibrillation: Secondary | ICD-10-CM

## 2017-07-31 DIAGNOSIS — Z125 Encounter for screening for malignant neoplasm of prostate: Secondary | ICD-10-CM

## 2017-07-31 DIAGNOSIS — F1010 Alcohol abuse, uncomplicated: Secondary | ICD-10-CM

## 2017-07-31 DIAGNOSIS — I10 Essential (primary) hypertension: Secondary | ICD-10-CM | POA: Diagnosis not present

## 2017-07-31 DIAGNOSIS — Z0000 Encounter for general adult medical examination without abnormal findings: Secondary | ICD-10-CM | POA: Diagnosis not present

## 2017-07-31 DIAGNOSIS — Z23 Encounter for immunization: Secondary | ICD-10-CM | POA: Diagnosis not present

## 2017-07-31 DIAGNOSIS — M25551 Pain in right hip: Secondary | ICD-10-CM

## 2017-07-31 DIAGNOSIS — E663 Overweight: Secondary | ICD-10-CM

## 2017-07-31 DIAGNOSIS — G8929 Other chronic pain: Secondary | ICD-10-CM

## 2017-07-31 LAB — CBC WITH DIFFERENTIAL/PLATELET
Basophils Absolute: 0.1 10*3/uL (ref 0.0–0.1)
Basophils Relative: 0.9 % (ref 0.0–3.0)
Eosinophils Absolute: 0.1 10*3/uL (ref 0.0–0.7)
Eosinophils Relative: 0.8 % (ref 0.0–5.0)
HCT: 43.1 % (ref 39.0–52.0)
Hemoglobin: 14.3 g/dL (ref 13.0–17.0)
Lymphocytes Relative: 22.8 % (ref 12.0–46.0)
Lymphs Abs: 1.5 10*3/uL (ref 0.7–4.0)
MCHC: 33.1 g/dL (ref 30.0–36.0)
MCV: 94.1 fl (ref 78.0–100.0)
Monocytes Absolute: 0.7 10*3/uL (ref 0.1–1.0)
Monocytes Relative: 10.7 % (ref 3.0–12.0)
Neutro Abs: 4.3 10*3/uL (ref 1.4–7.7)
Neutrophils Relative %: 64.8 % (ref 43.0–77.0)
Platelets: 227 10*3/uL (ref 150.0–400.0)
RBC: 4.58 Mil/uL (ref 4.22–5.81)
RDW: 13.7 % (ref 11.5–15.5)
WBC: 6.6 10*3/uL (ref 4.0–10.5)

## 2017-07-31 LAB — COMPREHENSIVE METABOLIC PANEL
ALT: 11 U/L (ref 0–53)
AST: 11 U/L (ref 0–37)
Albumin: 4.5 g/dL (ref 3.5–5.2)
Alkaline Phosphatase: 65 U/L (ref 39–117)
BUN: 19 mg/dL (ref 6–23)
CO2: 35 mEq/L — ABNORMAL HIGH (ref 19–32)
Calcium: 9.6 mg/dL (ref 8.4–10.5)
Chloride: 98 mEq/L (ref 96–112)
Creatinine, Ser: 1.23 mg/dL (ref 0.40–1.50)
GFR: 64.33 mL/min (ref >60.00)
Glucose, Bld: 118 mg/dL — ABNORMAL HIGH (ref 70–99)
Potassium: 4.8 mEq/L (ref 3.5–5.1)
Sodium: 141 mEq/L (ref 135–145)
Total Bilirubin: 0.6 mg/dL (ref 0.2–1.2)
Total Protein: 7.3 g/dL (ref 6.0–8.3)

## 2017-07-31 LAB — TSH: TSH: 1.21 u[IU]/mL (ref 0.35–4.50)

## 2017-07-31 LAB — LIPID PANEL
Cholesterol: 191 mg/dL (ref 0–200)
HDL: 58.1 mg/dL (ref >39.00)
LDL Cholesterol: 111 mg/dL — ABNORMAL HIGH (ref 0–99)
NonHDL: 132.58
Total CHOL/HDL Ratio: 3
Triglycerides: 107 mg/dL (ref 0.0–149.0)
VLDL: 21.4 mg/dL (ref 0.0–40.0)

## 2017-07-31 LAB — PSA: PSA: 1.9 ng/mL (ref 0.10–4.00)

## 2017-07-31 LAB — HEMOGLOBIN A1C: Hgb A1c MFr Bld: 6.1 % (ref 4.6–6.5)

## 2017-07-31 NOTE — Progress Notes (Signed)
Office Note 07/31/2017  CC:  Chief Complaint  Patient presents with  . Annual Exam    Pt is fasting.     HPI:  Robert [name] is a 57 y.o. White male who is here for annual health maintenance exam.  Exercise: right hip arthritis prevents this.  Was scheduled for surgery at one point (2017) was told his insurance expired, so he has put this off for a long time since. Diet: moderately good diabetic diet.   Eyes: last visit 2 yrs ago.   Dental: no preventative visits.  He is not working now. He quit smoking cigs about 3-4 yrs ago. Hx of alcohol abuse.  Says now he only drinks on the weekends, but when he does drink it is a large amount of whisky-enough to get drunk and is a fall risk and risk for acute GI bleed, acute hepatitis, acute pancreatitis.  Past Medical History:  Diagnosis Date  . Alcohol abuse    ongoing as of 01/2017  . Arthritis   . Atrial fibrillation ([place])    Not candidate for anticoagulation b/c of GI bleeding; had to get transfused.  Holding off on ASA until patient quits drinking.  . Gastric ulcer    hx of  . Gout    Usually MTP joint  . History of digital clubbing    "all my adult life"--CXR 08/2016 = bronchitic changes o/w normal.  . Hyperkalemia 04/2017   Normalized with decreasing lisinopril from 40 mg qd to 20 mg qd.  . Hyperlipidemia 07/2016   Holding off on statin until pt quits drinking.  Marland Kitchen Hypertension   . Nonischemic cardiomyopathy ([place])    resolved  . Nonrheumatic mitral valve regurgitation   . Osteoarthritis of right hip    Scheduled for surgery 05/11/15 but he then declined to get the surgery.  . Tobacco abuse    Quit 2014  . Type II diabetes mellitus ([place]) 07/2016   Dx'd by two fasting glucoses > 126 (Hb a1c 6.2% at that time).  Holding off on ASA until patient quits drinking.    Past Surgical History:  Procedure Laterality Date  . CARDIOVASCULAR STRESS TEST    . CARDIOVERSION     DC  . TONSILLECTOMY      Family History  Problem  Relation Age of Onset  . Diabetes Mother   . Congestive Heart Failure Mother   . Alcohol abuse Father   . Hypertension Father   . Stroke Father   . Heart disease Paternal Grandfather     Social History   Socioeconomic History  . Marital status: Married    Spouse name: Not on file  . Number of children: Not on file  . Years of education: Not on file  . Highest education level: Not on file  Occupational History  . Not on file  Social Needs  . Financial resource strain: Not on file  . Food insecurity:    Worry: Not on file    Inability: Not on file  . Transportation needs:    Medical: Not on file    Non-medical: Not on file  Tobacco Use  . Smoking status: Former Smoker    Packs/day: 1.00    Years: 5.00    Pack years: 5.00    Types: Cigarettes    Last attempt to quit: 01/10/2013    Years since quitting: 4.5  . Smokeless tobacco: Never Used  Substance and Sexual Activity  . Alcohol use: Yes    Comment: drinks liquor daily  .  Drug use: No  . Sexual activity: Not on file  Lifestyle  . Physical activity:    Days per week: Not on file    Minutes per session: Not on file  . Stress: Not on file  Relationships  . Social connections:    Talks on phone: Not on file    Gets together: Not on file    Attends religious service: Not on file    Active member of club or organization: Not on file    Attends meetings of clubs or organizations: Not on file    Relationship status: Not on file  . Intimate partner violence:    Fear of current or ex partner: Not on file    Emotionally abused: Not on file    Physically abused: Not on file    Forced sexual activity: Not on file  Other Topics Concern  . Not on file  Social History Narrative   Married, 2 adult children.   Educ: 11th grade   Occup: "out of work".  Worked for [employer]--was let go for not abiding by [employer] per pt/wife.   Tob: former--quit 2014.  "Of and on prior".   Alc: currently drinks a pint and 1 and  1/2 pint of whiskey per day x years---long hx of alcohol abuse.   Drugs: none.    Outpatient Medications Prior to Visit  Medication Sig Dispense Refill  . digoxin (LANOXIN) 0.125 MG tablet Take 1 tablet by mouth daily.    Marland Kitchen glucose blood (CONTOUR NEXT TEST) test strip Use to check blood sugar once daily. 100 each 12  . lisinopril (PRINIVIL,ZESTRIL) 40 MG tablet Take 1 tablet by mouth daily.    . metFORMIN (GLUCOPHAGE) 500 MG tablet TAKE 1 TABLET AT BEDTIME 90 tablet 1  . metoprolol succinate (TOPROL-XL) 100 MG 24 hr tablet Take 1 tablet by mouth 2 (two) times daily.    Marland Kitchen MICROLET LANCETS MISC Use to check blood sugar once daily 100 each 11  . pantoprazole (PROTONIX) 40 MG tablet Take 40 mg by mouth 2 (two) times daily.  0  . blood glucose meter kit and supplies Use to check blood sugar once daily. (Patient not taking: Reported on 07/31/2017) 1 each 0  . furosemide (LASIX) 20 MG tablet 1 tab po qd x 2 days (Patient not taking: Reported on 07/31/2017) 2 tablet 0   No facility-administered medications prior to visit.     No Known Allergies  ROS Review of Systems  Constitutional: Negative for appetite change, chills, fatigue and fever.  HENT: Negative for congestion, dental problem, ear pain and sore throat.   Eyes: Negative for discharge, redness and visual disturbance.  Respiratory: Negative for cough, chest tightness, shortness of breath and wheezing.   Cardiovascular: Negative for chest pain, palpitations and leg swelling.  Gastrointestinal: Negative for abdominal pain, blood in stool, diarrhea, nausea and vomiting.  Genitourinary: Negative for difficulty urinating, dysuria, flank pain, frequency, hematuria and urgency.  Musculoskeletal: Positive for arthralgias (severe right hip pain--chronic (osteoarthritis, end stage)). Negative for back pain, joint swelling, myalgias and neck stiffness.  Skin: Negative for pallor and rash.  Neurological: Negative for dizziness, speech difficulty,  weakness and headaches.  Hematological: Negative for adenopathy. Does not bruise/bleed easily.  Psychiatric/Behavioral: Negative for confusion and sleep disturbance. The patient is not nervous/anxious.     PE; Blood pressure 132/85, pulse 81, temperature 98.7 F (37.1 C), temperature source Oral, resp. rate 16, height '5\' 8"'$  (1.727 m), weight 184 lb 8 oz (  83.7 kg), SpO2 98 %. Body mass index is 28.05 kg/m.  Gen: Alert, tired-appearing.  In nad.  Patient is oriented to person, place, time, and situation. AFFECT: pleasant, lucid thought and speech. He has significant struggling to walk from chair to exam table due to R hip pain, has to be assisted onto exam table. ENT: Ears: EACs clear, normal epithelium.  TMs with good light reflex and landmarks bilaterally.  Eyes: no injection, icteris, swelling, or exudate.  EOMI, PERRLA. Nose: no drainage or turbinate edema/swelling.  No injection or focal lesion.  Mouth: lips without lesion/swelling.  Oral mucosa pink and moist.  Dentition intact and without obvious caries or gingival swelling.  Oropharynx without erythema, exudate, or swelling.  Neck: supple/nontender.  No LAD, mass, or TM.  Carotid pulses 2+ bilaterally, without bruits. CV: Irreg irreg, rate 80, no m/r/g.   LUNGS: CTA bilat, nonlabored resps, good aeration in all lung fields. ABD: soft, NT, ND, BS normal.  No hepatospenomegaly or mass.  No bruits. EXT: significant digital clubbing.  No  Cyanosis.  Trace pitting edema R LL,  2 + pitting edema L LL.  Musculoskeletal: no joint swelling, erythema, warmth, or tenderness.  ROM of all joints intact except R hip pain ROM significantly limited due to pain. Skin - no sores or suspicious lesions or rashes or color changes   Pertinent labs:  Lab Results  Component Value Date   TSH 1.72 08/01/2016   Lab Results  Component Value Date   WBC 6.8 08/01/2016   HGB 16.2 08/01/2016   HCT 49.6 08/01/2016   MCV 102.1 (H) 08/01/2016   PLT 215.0  08/01/2016   Lab Results  Component Value Date   CREATININE 1.04 05/19/2017   BUN 13 05/19/2017   NA 143 05/19/2017   K 4.4 05/19/2017   CL 101 05/19/2017   CO2 34 (H) 05/19/2017   Lab Results  Component Value Date   ALT 26 08/01/2016   AST 30 08/01/2016   ALKPHOS 74 08/01/2016   BILITOT 0.9 08/01/2016   Lab Results  Component Value Date   CHOL 207 (H) 08/01/2016   Lab Results  Component Value Date   HDL 55.80 08/01/2016   Lab Results  Component Value Date   LDLCALC 129 (H) 08/01/2016   Lab Results  Component Value Date   TRIG 111.0 08/01/2016   Lab Results  Component Value Date   CHOLHDL 4 08/01/2016   Lab Results  Component Value Date   PSA 2.08 08/01/2016   Lab Results  Component Value Date   HGBA1C 5.9 04/20/2017   ASSESSMENT AND PLAN:   Health maintenance exam: Reviewed age and gender appropriate health maintenance issues (prudent diet, regular exercise, health risks of tobacco and excessive alcohol, use of seatbelts, fire alarms in home, use of sunscreen).  Also reviewed age and gender appropriate health screening as well as vaccine recommendations. Vaccines: needs pneumovax 23--we gave this today.   He is otherwise all UTD. Labs: fasting HP, HbA1c, PSA. Prostate ca screening: he declines DRE, but is ok with PSA. Colon ca screening: he declines.  Regarding his a-fib: controlled VR, but not on anticoagulation b/c risk likely>benefit. Has hx of gastric ulcer/risk of GI bleed PLUS he still drinks too much on weekends and I believe that this COMBINED with his instability from his chronic hip arthritis makes him too high of a fall risk.  An After Visit Summary was printed and given to the patient.  FOLLOW UP:  Return in about  3 months (around 10/31/2017) for routine chronic illness f/u.  Signed:  Crissie Sickles, MD           07/31/2017

## 2017-07-31 NOTE — Patient Instructions (Signed)

## 2017-08-14 ENCOUNTER — Other Ambulatory Visit: Payer: Self-pay | Admitting: Family Medicine

## 2017-11-03 ENCOUNTER — Ambulatory Visit: Payer: BLUE CROSS/BLUE SHIELD | Admitting: Family Medicine

## 2017-11-06 ENCOUNTER — Encounter: Payer: Self-pay | Admitting: Family Medicine

## 2017-11-06 ENCOUNTER — Ambulatory Visit: Payer: BLUE CROSS/BLUE SHIELD | Admitting: Family Medicine

## 2017-11-06 VITALS — BP 111/76 | HR 87 | Temp 97.3°F | Resp 16 | Ht 68.0 in | Wt 174.4 lb

## 2017-11-06 DIAGNOSIS — M1000 Idiopathic gout, unspecified site: Secondary | ICD-10-CM

## 2017-11-06 DIAGNOSIS — Z23 Encounter for immunization: Secondary | ICD-10-CM | POA: Diagnosis not present

## 2017-11-06 DIAGNOSIS — I10 Essential (primary) hypertension: Secondary | ICD-10-CM | POA: Diagnosis not present

## 2017-11-06 DIAGNOSIS — E119 Type 2 diabetes mellitus without complications: Secondary | ICD-10-CM | POA: Diagnosis not present

## 2017-11-06 DIAGNOSIS — F1010 Alcohol abuse, uncomplicated: Secondary | ICD-10-CM

## 2017-11-06 DIAGNOSIS — E663 Overweight: Secondary | ICD-10-CM

## 2017-11-06 LAB — BASIC METABOLIC PANEL
BUN: 21 mg/dL (ref 6–23)
CO2: 29 mEq/L (ref 19–32)
Calcium: 9.5 mg/dL (ref 8.4–10.5)
Chloride: 95 mEq/L — ABNORMAL LOW (ref 96–112)
Creatinine, Ser: 1.01 mg/dL (ref 0.40–1.50)
GFR: 80.68 mL/min (ref >60.00)
Glucose, Bld: 142 mg/dL — ABNORMAL HIGH (ref 70–99)
Potassium: 5.3 mEq/L — ABNORMAL HIGH (ref 3.5–5.1)
Sodium: 135 mEq/L (ref 135–145)

## 2017-11-06 LAB — HEMOGLOBIN A1C: Hgb A1c MFr Bld: 6 % (ref 4.6–6.5)

## 2017-11-06 LAB — URIC ACID: Uric Acid, Serum: 7.3 mg/dL (ref 4.0–7.8)

## 2017-11-06 MED ORDER — ALLOPURINOL 100 MG PO TABS: 100.0000 mg | ORAL_TABLET | Freq: Every day | ORAL | 0 refills | Status: DC

## 2017-11-06 MED ORDER — PREDNISONE 20 MG PO TABS: ORAL_TABLET | ORAL | 0 refills | Status: DC

## 2017-11-06 NOTE — Patient Instructions (Signed)
Return in 3 weeks for a lab visit---get on lab schedule--for repeat of your gout test.

## 2017-11-06 NOTE — Progress Notes (Signed)
OFFICE VISIT  11/06/2017   CC:  Chief Complaint  Patient presents with  . Follow-up    RCI, pt is fasting.    HPI:    Patient is a 57 y.o. Caucasian male who presents for DM 2, HTN, HLD. HIs alcohol abuse has had [place] hesitant to start any ASA for his a-fib or statin for his hyperlipidemia.  Describes recent joint swelling; says he has had recurrent "flares" of red/swollen joints--dx'd as gout, once was R knee, also elbow once, but usually ankles, great toes, sometimes R MCP joint.  A couple of flares per year over the last 20 yrs, usually runs its course in 1-2 wks, drinking lots of water helps.  He has noted that some foods such as red meat causes a flare.  Has never been on gout medication at all.  Says not drinking alcohol on weekdays, but drinks a couple of pints of whiskey over the weekend each weekend.  DM: no home glucose monitoring lately.  Makes some attempt to limit carbs and high fat foods.  HTN: no home bp monitoring to report.  HLD:  Pt fasting today, not eating a very good low carb/low fat diet.   ROS: no CP, no SOB, no wheezing, no cough, no dizziness, no HAs, no rashes, no melena/hematochezia.  No polyuria or polydipsia.  No fevers. No falls.  Past Medical History:  Diagnosis Date  . Alcohol abuse    ongoing as of 07/2017  . Arthritis   . Atrial fibrillation ([place])    Not candidate for anticoagulation b/c of GI bleeding; had to get transfused.  Holding off on ASA until patient quits drinking.  . Gastric ulcer    hx of  . Gout    Usually MTP joint  . History of digital clubbing    "all my adult life"--CXR 08/2016 = bronchitic changes o/w normal.  . Hyperkalemia 04/2017   Normalized with decreasing lisinopril from 40 mg qd to 20 mg qd.  . Hyperlipidemia 07/2016   Holding off on statin until pt quits drinking.  Marland Kitchen Hypertension   . Nonischemic cardiomyopathy ([place])    resolved  . Nonrheumatic mitral valve regurgitation   . Osteoarthritis of right hip    Scheduled for surgery 05/11/15 but he then declined to get the surgery.  . Tobacco abuse    Quit 2014  . Type II diabetes mellitus ([place]) 07/2016   Dx'd by two fasting glucoses > 126 (Hb a1c 6.2% at that time).  Holding off on ASA until patient quits drinking.    Past Surgical History:  Procedure Laterality Date  . CARDIOVASCULAR STRESS TEST    . CARDIOVERSION     DC  . TONSILLECTOMY      Outpatient Medications Prior to Visit  Medication Sig Dispense Refill  . digoxin (LANOXIN) 0.125 MG tablet Take 1 tablet by mouth daily.    Marland Kitchen glucose blood (CONTOUR NEXT TEST) test strip Use to check blood sugar once daily. 100 each 12  . lisinopril (PRINIVIL,ZESTRIL) 40 MG tablet Take 1 tablet by mouth daily.    . metFORMIN (GLUCOPHAGE) 500 MG tablet TAKE 1 TABLET AT BEDTIME 90 tablet 1  . metoprolol succinate (TOPROL-XL) 100 MG 24 hr tablet Take 1 tablet by mouth 2 (two) times daily.    Marland Kitchen MICROLET LANCETS MISC Use to check blood sugar once daily 100 each 11  . pantoprazole (PROTONIX) 40 MG tablet Take 40 mg by mouth 2 (two) times daily.  0   No facility-administered  medications prior to visit.     No Known Allergies  ROS As per HPI  PE: Blood pressure 111/76, pulse 87, temperature (!) 97.3 F (36.3 C), temperature source Oral, resp. rate 16, height 5\' 8"  (1.727 m), weight 174 lb 6 oz (79.1 kg), SpO2 97 %. Body mass index is 26.51 kg/m.  Gen: Alert, well appearing.  Patient is oriented to person, place, time, and situation. AFFECT: pleasant, lucid thought and speech. EXT: no edema.  He has significant clubbing of all toes and fingers.  His R index finger MCP joint and PIP jt are erythematous, warm, swollen, and tender and have markedly decreased ROM.  LABS:   No results found for: [facility]  Lab Results  Component Value Date   TSH 1.21 07/31/2017   Lab Results  Component Value Date   WBC 6.6 07/31/2017   HGB 14.3 07/31/2017   HCT 43.1 07/31/2017   MCV 94.1 07/31/2017   PLT 227.0  07/31/2017   Lab Results  Component Value Date   CREATININE 1.23 07/31/2017   BUN 19 07/31/2017   NA 141 07/31/2017   K 4.8 07/31/2017   CL 98 07/31/2017   CO2 35 (H) 07/31/2017   Lab Results  Component Value Date   ALT 11 07/31/2017   AST 11 07/31/2017   ALKPHOS 65 07/31/2017   BILITOT 0.6 07/31/2017   Lab Results  Component Value Date   CHOL 191 07/31/2017   Lab Results  Component Value Date   HDL 58.10 07/31/2017   Lab Results  Component Value Date   LDLCALC 111 (H) 07/31/2017   Lab Results  Component Value Date   TRIG 107.0 07/31/2017   Lab Results  Component Value Date   CHOLHDL 3 07/31/2017   Lab Results  Component Value Date   PSA 1.90 07/31/2017   PSA 2.08 08/01/2016   Lab Results  Component Value Date   HGBA1C 6.1 07/31/2017    IMPRESSION AND PLAN:  1) Inflammatory arthritis: suspect gouty arthritis-->currently flared in R index finger MCP and PIP jts. Check uric acid level. Prednisone 40mg  qd x 5d. Start allopurinol 100 mg qd. Recheck uric acid level in 3 wks---lab visit only, ordered future today.  2) DM 2: no home monitoring. Historically well controlled. HbA1c today.  Reminded pt he is due for diab retpthy screening exam. Tdap and flu vaccines today.  3) HTN: The current medical regimen is effective;  continue present plan and medications. BMET today.  4) Alcohol abuse: he has cut back on intake significantly and has no hx of falls since I've been seeing him for the last  15 months.  I mentioned it would be reasonable to start statin  (DM 2 dx) and ASA vs anticoagulant today (a-fib), but he declined and said "maybe later".  An After Visit Summary was printed and given to the patient.  FOLLOW UP: Return in about 3 months (around 02/06/2018) for routine chronic illness f/u.  Signed:  Crissie Sickles, MD           11/06/2017

## 2017-11-11 ENCOUNTER — Other Ambulatory Visit: Payer: Self-pay | Admitting: Family Medicine

## 2017-11-28 ENCOUNTER — Other Ambulatory Visit: Payer: Self-pay | Admitting: Family Medicine

## 2017-11-28 NOTE — Telephone Encounter (Signed)
RF request for allopurinol LOV: 11/06/17 Next ov: 02/09/18 Last written: 11/06/17 #30 w/ 0RF  Please advise. Thanks.

## 2017-11-28 NOTE — Telephone Encounter (Signed)
Pt has lab apt on 12/01/17 at 8:15am for uric acid check.

## 2017-11-28 NOTE — Telephone Encounter (Signed)
Pt needs to come in for lab visit to get uric acid recheck. I told him this at his most recent o/v. Lab is ordered already. I'll authorize RF of allopurinol 100 mg tabs today.

## 2017-12-01 ENCOUNTER — Other Ambulatory Visit: Payer: Self-pay | Admitting: Family Medicine

## 2017-12-01 ENCOUNTER — Other Ambulatory Visit (INDEPENDENT_AMBULATORY_CARE_PROVIDER_SITE_OTHER): Payer: BLUE CROSS/BLUE SHIELD

## 2017-12-01 DIAGNOSIS — M1000 Idiopathic gout, unspecified site: Secondary | ICD-10-CM | POA: Diagnosis not present

## 2017-12-01 LAB — URIC ACID: Uric Acid, Serum: 5.3 mg/dL (ref 4.0–7.8)

## 2017-12-01 MED ORDER — ALLOPURINOL 100 MG PO TABS: 100.0000 mg | ORAL_TABLET | Freq: Every day | ORAL | 1 refills | Status: DC

## 2018-02-09 ENCOUNTER — Encounter: Payer: Self-pay | Admitting: Family Medicine

## 2018-02-09 ENCOUNTER — Ambulatory Visit: Payer: BLUE CROSS/BLUE SHIELD | Admitting: Family Medicine

## 2018-02-09 VITALS — BP 145/82 | HR 57 | Temp 98.0°F | Resp 16 | Ht 68.0 in | Wt 178.0 lb

## 2018-02-09 DIAGNOSIS — M109 Gout, unspecified: Secondary | ICD-10-CM | POA: Diagnosis not present

## 2018-02-09 DIAGNOSIS — E119 Type 2 diabetes mellitus without complications: Secondary | ICD-10-CM | POA: Diagnosis not present

## 2018-02-09 DIAGNOSIS — Z8639 Personal history of other endocrine, nutritional and metabolic disease: Secondary | ICD-10-CM

## 2018-02-09 DIAGNOSIS — I10 Essential (primary) hypertension: Secondary | ICD-10-CM

## 2018-02-09 DIAGNOSIS — I4891 Unspecified atrial fibrillation: Secondary | ICD-10-CM

## 2018-02-09 LAB — BASIC METABOLIC PANEL
BUN: 15 mg/dL (ref 6–23)
CO2: 32 mEq/L (ref 19–32)
Calcium: 9.7 mg/dL (ref 8.4–10.5)
Chloride: 97 mEq/L (ref 96–112)
Creatinine, Ser: 1 mg/dL (ref 0.40–1.50)
GFR: 76.71 mL/min (ref >60.00)
Glucose, Bld: 104 mg/dL — ABNORMAL HIGH (ref 70–99)
Potassium: 4.2 mEq/L (ref 3.5–5.1)
Sodium: 138 mEq/L (ref 135–145)

## 2018-02-09 LAB — HEMOGLOBIN A1C: Hgb A1c MFr Bld: 5.6 % (ref 4.6–6.5)

## 2018-02-09 NOTE — Patient Instructions (Signed)
Pick up allopurinol at your pharmacy (this medication is supposed to be take once EVERY DAY in order to decrease your risk of getting any further gout flares).

## 2018-02-09 NOTE — Progress Notes (Signed)
OFFICE VISIT  02/09/2018   CC:  Chief Complaint  Patient presents with  . Follow-up    RCI, pt is fasting.    HPI:    Patient is a 58 y.o. Caucasian male who presents for 3 mo f/u HTN, DM 2, had gout flare last visit and I started him on prednisone burst and allopurinol 100 mg qd.  Flare resolved and recheck of uric acid on allopurinol was <6. Unfortunately, he only took this for 1 mo, says he doesn't know anything about getting RF so he didn't take any more past the 1 mo period.  He has had no further flare ups.  DM: No home glucose measurements.  He has made minimal dietary adjustments for dx of DM. Exercise: none.  Says hip and back pain preclude much exercise.  HTN: no home bp measurements.   A-fib: says he has cardiology f/u planned for next month but he has not made appt yet (he has seen [facility] in [place]--Dr. Prince Rome and Cathlean Sauer, NP). Since he reports that he has cut back his drinking to weekends only AND he has not had any falls, perhaps they'll reconsider starting him on anticoagulant for CVA prevention.    Past Medical History:  Diagnosis Date  . Alcohol abuse    ongoing as of 07/2017  . Arthritis   . Atrial fibrillation ([place])    Not candidate for anticoagulation b/c of GI bleeding; had to get transfused.  Holding off on ASA until patient quits drinking.  . Gastric ulcer    hx of  . Gout    Usually MTP joint  . History of digital clubbing    "all my adult life"--CXR 08/2016 = bronchitic changes o/w normal.  . Hyperkalemia 04/2017   Normalized with decreasing lisinopril from 40 mg qd to 20 mg qd.  . Hyperlipidemia 07/2016   Holding off on statin until pt quits drinking.  Marland Kitchen Hypertension   . Nonischemic cardiomyopathy ([place])    resolved  . Nonrheumatic mitral valve regurgitation   . Osteoarthritis of right hip    Scheduled for surgery 05/11/15 but he then declined to get the surgery.  . Tobacco abuse    Quit 2014  . Type II diabetes mellitus ([place]) 07/2016    Dx'd by two fasting glucoses > 126 (Hb a1c 6.2% at that time).  Holding off on ASA until patient quits drinking.    Past Surgical History:  Procedure Laterality Date  . CARDIOVASCULAR STRESS TEST    . CARDIOVERSION     DC  . TONSILLECTOMY      Outpatient Medications Prior to Visit  Medication Sig Dispense Refill  . digoxin (LANOXIN) 0.125 MG tablet Take 1 tablet by mouth daily.    Marland Kitchen glucose blood (CONTOUR NEXT TEST) test strip Use to check blood sugar once daily. 100 each 12  . lisinopril (PRINIVIL,ZESTRIL) 40 MG tablet Take 1 tablet by mouth daily.    . metFORMIN (GLUCOPHAGE) 500 MG tablet TAKE 1 TABLET AT BEDTIME 90 tablet 1  . metoprolol succinate (TOPROL-XL) 100 MG 24 hr tablet Take 1 tablet by mouth 2 (two) times daily.    Marland Kitchen MICROLET LANCETS MISC Use to check blood sugar once daily 100 each 11  . pantoprazole (PROTONIX) 40 MG tablet Take 40 mg by mouth 2 (two) times daily.  0  . allopurinol (ZYLOPRIM) 100 MG tablet Take 1 tablet (100 mg total) by mouth daily. (Patient not taking: Reported on 02/09/2018) 90 tablet 1  . predniSONE (  DELTASONE) 20 MG tablet 2 tabs po qd x 5d (Patient not taking: Reported on 02/09/2018) 10 tablet 0   No facility-administered medications prior to visit.     No Known Allergies  ROS As per HPI  PE: Blood pressure (!) 145/82, pulse (!) 57, temperature 98 F (36.7 C), temperature source Oral, resp. rate 16, height 5\' 8"  (1.727 m), weight 178 lb (80.7 kg), SpO2 100 %. Gen: Alert, well appearing.  Patient is oriented to person, place, time, and situation. AFFECT: pleasant, lucid thought and speech. CV: Irreg irreg, rate 60 or so, no m/r/g.   LUNGS: CTA bilat, nonlabored resps, good aeration in all lung fields. EXT: no clubbing or cyanosis.  no edema.    LABS:  Lab Results  Component Value Date   LABURIC 5.3 12/01/2017    Lab Results  Component Value Date   TSH 1.21 07/31/2017   Lab Results  Component Value Date   WBC 6.6 07/31/2017    HGB 14.3 07/31/2017   HCT 43.1 07/31/2017   MCV 94.1 07/31/2017   PLT 227.0 07/31/2017   Lab Results  Component Value Date   CREATININE 1.01 11/06/2017   BUN 21 11/06/2017   NA 135 11/06/2017   K 5.3 (H) 11/06/2017   CL 95 (L) 11/06/2017   CO2 29 11/06/2017   Lab Results  Component Value Date   ALT 11 07/31/2017   AST 11 07/31/2017   ALKPHOS 65 07/31/2017   BILITOT 0.6 07/31/2017   Lab Results  Component Value Date   CHOL 191 07/31/2017   Lab Results  Component Value Date   HDL 58.10 07/31/2017   Lab Results  Component Value Date   LDLCALC 111 (H) 07/31/2017   Lab Results  Component Value Date   TRIG 107.0 07/31/2017   Lab Results  Component Value Date   CHOLHDL 3 07/31/2017   Lab Results  Component Value Date   PSA 1.90 07/31/2017   PSA 2.08 08/01/2016   Lab Results  Component Value Date   HGBA1C 6.0 11/06/2017    IMPRESSION AND PLAN:  1) Gouty arthritis: resolved flare. He misunderstood instructions about allopurinol. I recommended he restart 100 mg qd.  2) DM 2: historically well controlled. HbA1c today. He refused to take his shoes and socks off for feet exam today. He said "my feet are fine".  3) HTN: control is ok here.  NO home monitoring being done. Encouraged home bp monitoring. BMET today.  4) A-fib, chronic: rate is controlled well with toprol xl and dig. Has not been on any anticoagulant due to history of alcohol abuse and high fall risk. Since alcohol abuse has decreased and he has not had any falls, perhaps his cardiologist, whom he sees next month, will reconsider getting him on anticoagulant.  An After Visit Summary was printed and given to the patient.  FOLLOW UP: Return in about 3 months (around 05/10/2018) for routine chronic illness f/u.  Signed:  Crissie Sickles, MD           02/09/2018

## 2018-02-28 ENCOUNTER — Other Ambulatory Visit: Payer: Self-pay | Admitting: Family Medicine

## 2018-03-13 ENCOUNTER — Telehealth: Payer: Self-pay | Admitting: *Deleted

## 2018-03-13 NOTE — Telephone Encounter (Signed)
Spoke with patient.  He is upset that we will not call anything in.   He states that he has been in the bed for 2 days and is not able to put any weight on his foot.   He states that if he has to be seen, he needed me to call in some medication to ease the pain and swelling before he can even walk to get in the office.  Advised that I could not call anything as provider needs to see him before anything is called in.   Patient was not happy and asked that I send to Dr. Anitra Lauth to reconsider.

## 2018-03-13 NOTE — Telephone Encounter (Signed)
Copied from [place] [id number]. Topic: General - Other >> Mar 13, 2018  8:41 AM Carolyn Stare wrote:  Pt is requesting a RX for gout  he is asking for prednisone and he said he spoke with Dr Anitra Lauth about his gout at his last appt   Pharmacy [facility]

## 2018-03-13 NOTE — Telephone Encounter (Signed)
Called and spoke with pt and appt was made by patient for in the morning. Pt did state he did not know how he was going to get in the building and I told him we do have wheel chairs if one is needed. Pt verbalized understanding that medication could not be filled until seen by MD

## 2018-03-13 NOTE — Telephone Encounter (Signed)
Pt needs to be seen in office before any rx med can be recommended.--sorry.

## 2018-03-14 ENCOUNTER — Encounter: Payer: Self-pay | Admitting: Family Medicine

## 2018-03-14 ENCOUNTER — Ambulatory Visit: Payer: BLUE CROSS/BLUE SHIELD | Admitting: Family Medicine

## 2018-03-14 VITALS — BP 144/80 | HR 74 | Temp 97.5°F | Resp 16 | Ht 68.0 in | Wt 178.0 lb

## 2018-03-14 DIAGNOSIS — M109 Gout, unspecified: Secondary | ICD-10-CM | POA: Diagnosis not present

## 2018-03-14 MED ORDER — ALLOPURINOL 100 MG PO TABS: ORAL_TABLET | ORAL | 2 refills | Status: DC

## 2018-03-14 MED ORDER — PREDNISONE 20 MG PO TABS: ORAL_TABLET | ORAL | 0 refills | Status: DC

## 2018-03-14 NOTE — Progress Notes (Signed)
OFFICE VISIT  03/14/2018   CC:  Chief Complaint  Patient presents with  . Right ankle and Great toe pain    HPI:    Patient is a 58 y.o. Caucasian male with history of gout who presents for pain in R ankle and R big toe. Onset of bad pain 4 d/a, has been eating red meat quite a bit. Pain 10/10 intensity.  Has not taken any otc pain meds. He has been compliant with allopurinol 100 mg qd.  Past Medical History:  Diagnosis Date  . Alcohol abuse    ongoing as of 07/2017  . Arthritis   . Atrial fibrillation ([place])    Not candidate for anticoagulation b/c of GI bleeding; had to get transfused.  Holding off on ASA until patient quits drinking.  . Gastric ulcer    hx of  . Gout    Usually MTP joint  . History of digital clubbing    "all my adult life"--CXR 08/2016 = bronchitic changes o/w normal.  . Hyperkalemia 04/2017   Normalized with decreasing lisinopril from 40 mg qd to 20 mg qd.  . Hyperlipidemia 07/2016   Holding off on statin until pt quits drinking.  Marland Kitchen Hypertension   . Nonischemic cardiomyopathy ([place])    resolved  . Nonrheumatic mitral valve regurgitation   . Osteoarthritis of right hip    Scheduled for surgery 05/11/15 but he then declined to get the surgery.  . Tobacco abuse    Quit 2014  . Type II diabetes mellitus ([place]) 07/2016   Dx'd by two fasting glucoses > 126 (Hb a1c 6.2% at that time).  Holding off on ASA until patient quits drinking.    Past Surgical History:  Procedure Laterality Date  . CARDIOVASCULAR STRESS TEST    . CARDIOVERSION     DC  . TONSILLECTOMY      Outpatient Medications Prior to Visit  Medication Sig Dispense Refill  . digoxin (LANOXIN) 0.125 MG tablet Take 1 tablet by mouth daily.    Marland Kitchen glucose blood (CONTOUR NEXT TEST) test strip Use to check blood sugar once daily. 100 each 12  . lisinopril (PRINIVIL,ZESTRIL) 40 MG tablet Take 1 tablet by mouth daily.    . metFORMIN (GLUCOPHAGE) 500 MG tablet TAKE 1 TABLET AT BEDTIME 90 tablet 1  .  metoprolol succinate (TOPROL-XL) 100 MG 24 hr tablet Take 1 tablet by mouth 2 (two) times daily.    Marland Kitchen MICROLET LANCETS MISC Use to check blood sugar once daily 100 each 11  . pantoprazole (PROTONIX) 40 MG tablet Take 40 mg by mouth 2 (two) times daily.  0   No facility-administered medications prior to visit.     No Known Allergies  ROS As per HPI  PE: Blood pressure (!) 144/80, pulse 74, temperature (!) 97.5 F (36.4 C), temperature source Oral, resp. rate 16, height 5\' 8"  (1.727 m), weight 178 lb (80.7 kg), SpO2 100 %. Gen: Alert, well appearing.  Patient is oriented to person, place, time, and situation. AFFECT: pleasant, lucid thought and speech. R ankle and foot with mild diffuse STS, mild diffuse warmth, no signif erythema. VERY TTP all over R ankle, foot, and esp 1st MTP joint.  ROM limited due to pain. DP/PT pulses intact.  LABS:  Lab Results  Component Value Date   HGBA1C 5.6 02/09/2018      Chemistry      Component Value Date/Time   NA 138 02/09/2018 1010   K 4.2 02/09/2018 1010   CL  97 02/09/2018 1010   CO2 32 02/09/2018 1010   BUN 15 02/09/2018 1010   CREATININE 1.00 02/09/2018 1010      Component Value Date/Time   CALCIUM 9.7 02/09/2018 1010   ALKPHOS 65 07/31/2017 0837   AST 11 07/31/2017 0837   ALT 11 07/31/2017 0837   BILITOT 0.6 07/31/2017 0837     Lab Results  Component Value Date   LABURIC 5.3 12/01/2017    IMPRESSION AND PLAN:  Acute gouty arthritis of R ankle, R foot, and R great toe. Needs to stop eating red meat. Pred 40 x 5, then 20 x 5. Increase allopurinol to 200 mg qd. We'll recheck uric acid level at next f/u already set for 05/11/18.  An After Visit Summary was printed and given to the patient.  FOLLOW UP: Return for keep f/u already set for 05/11/2018.  Signed:  Crissie Sickles, MD           03/14/2018

## 2018-05-11 ENCOUNTER — Ambulatory Visit: Payer: BLUE CROSS/BLUE SHIELD | Admitting: Family Medicine

## 2018-05-11 DIAGNOSIS — I639 Cerebral infarction, unspecified: Secondary | ICD-10-CM

## 2018-05-11 HISTORY — DX: Cerebral infarction, unspecified: I63.9

## 2018-06-06 ENCOUNTER — Other Ambulatory Visit: Payer: Self-pay

## 2018-06-06 ENCOUNTER — Inpatient Hospital Stay (HOSPITAL_COMMUNITY)
Admission: EM | Admit: 2018-06-06 | Discharge: 2018-06-20 | DRG: 064 | Disposition: A | Discharge status: Skilled Nursing Facility | Payer: BLUE CROSS/BLUE SHIELD | Attending: Internal Medicine | Admitting: Internal Medicine

## 2018-06-06 ENCOUNTER — Emergency Department (HOSPITAL_COMMUNITY): Payer: BLUE CROSS/BLUE SHIELD

## 2018-06-06 ENCOUNTER — Encounter (HOSPITAL_COMMUNITY): Payer: Self-pay | Admitting: Emergency Medicine

## 2018-06-06 DIAGNOSIS — E872 Acidosis: Secondary | ICD-10-CM | POA: Diagnosis present

## 2018-06-06 DIAGNOSIS — Z79899 Other long term (current) drug therapy: Secondary | ICD-10-CM

## 2018-06-06 DIAGNOSIS — J9601 Acute respiratory failure with hypoxia: Secondary | ICD-10-CM | POA: Diagnosis not present

## 2018-06-06 DIAGNOSIS — F1010 Alcohol abuse, uncomplicated: Secondary | ICD-10-CM

## 2018-06-06 DIAGNOSIS — I10 Essential (primary) hypertension: Secondary | ICD-10-CM | POA: Diagnosis not present

## 2018-06-06 DIAGNOSIS — J9621 Acute and chronic respiratory failure with hypoxia: Secondary | ICD-10-CM | POA: Diagnosis not present

## 2018-06-06 DIAGNOSIS — I509 Heart failure, unspecified: Secondary | ICD-10-CM | POA: Diagnosis present

## 2018-06-06 DIAGNOSIS — R4781 Slurred speech: Secondary | ICD-10-CM | POA: Diagnosis present

## 2018-06-06 DIAGNOSIS — I110 Hypertensive heart disease with heart failure: Secondary | ICD-10-CM | POA: Diagnosis present

## 2018-06-06 DIAGNOSIS — I4891 Unspecified atrial fibrillation: Secondary | ICD-10-CM | POA: Diagnosis not present

## 2018-06-06 DIAGNOSIS — I4821 Permanent atrial fibrillation: Secondary | ICD-10-CM | POA: Diagnosis not present

## 2018-06-06 DIAGNOSIS — I428 Other cardiomyopathies: Secondary | ICD-10-CM | POA: Diagnosis present

## 2018-06-06 DIAGNOSIS — E119 Type 2 diabetes mellitus without complications: Secondary | ICD-10-CM | POA: Diagnosis present

## 2018-06-06 DIAGNOSIS — I340 Nonrheumatic mitral (valve) insufficiency: Secondary | ICD-10-CM | POA: Diagnosis not present

## 2018-06-06 DIAGNOSIS — I639 Cerebral infarction, unspecified: Secondary | ICD-10-CM | POA: Diagnosis not present

## 2018-06-06 DIAGNOSIS — R4701 Aphasia: Secondary | ICD-10-CM | POA: Diagnosis present

## 2018-06-06 DIAGNOSIS — G8194 Hemiplegia, unspecified affecting left nondominant side: Secondary | ICD-10-CM | POA: Diagnosis present

## 2018-06-06 DIAGNOSIS — Z4659 Encounter for fitting and adjustment of other gastrointestinal appliance and device: Secondary | ICD-10-CM

## 2018-06-06 DIAGNOSIS — R29705 NIHSS score 5: Secondary | ICD-10-CM | POA: Diagnosis present

## 2018-06-06 DIAGNOSIS — Z833 Family history of diabetes mellitus: Secondary | ICD-10-CM

## 2018-06-06 DIAGNOSIS — Z8379 Family history of other diseases of the digestive system: Secondary | ICD-10-CM

## 2018-06-06 DIAGNOSIS — M6282 Rhabdomyolysis: Secondary | ICD-10-CM | POA: Diagnosis present

## 2018-06-06 DIAGNOSIS — I63311 Cerebral infarction due to thrombosis of right middle cerebral artery: Secondary | ICD-10-CM | POA: Diagnosis not present

## 2018-06-06 DIAGNOSIS — G9341 Metabolic encephalopathy: Secondary | ICD-10-CM | POA: Diagnosis present

## 2018-06-06 DIAGNOSIS — I5043 Acute on chronic combined systolic (congestive) and diastolic (congestive) heart failure: Secondary | ICD-10-CM | POA: Diagnosis not present

## 2018-06-06 DIAGNOSIS — R1312 Dysphagia, oropharyngeal phase: Secondary | ICD-10-CM | POA: Diagnosis present

## 2018-06-06 DIAGNOSIS — Z1159 Encounter for screening for other viral diseases: Secondary | ICD-10-CM

## 2018-06-06 DIAGNOSIS — I959 Hypotension, unspecified: Secondary | ICD-10-CM | POA: Diagnosis present

## 2018-06-06 DIAGNOSIS — I63411 Cerebral infarction due to embolism of right middle cerebral artery: Secondary | ICD-10-CM

## 2018-06-06 DIAGNOSIS — I6501 Occlusion and stenosis of right vertebral artery: Secondary | ICD-10-CM | POA: Diagnosis present

## 2018-06-06 DIAGNOSIS — M1611 Unilateral primary osteoarthritis, right hip: Secondary | ICD-10-CM | POA: Diagnosis present

## 2018-06-06 DIAGNOSIS — E669 Obesity, unspecified: Secondary | ICD-10-CM | POA: Diagnosis present

## 2018-06-06 DIAGNOSIS — G9389 Other specified disorders of brain: Secondary | ICD-10-CM | POA: Diagnosis present

## 2018-06-06 DIAGNOSIS — R4182 Altered mental status, unspecified: Secondary | ICD-10-CM

## 2018-06-06 DIAGNOSIS — J95851 Ventilator associated pneumonia: Secondary | ICD-10-CM | POA: Diagnosis not present

## 2018-06-06 DIAGNOSIS — Z7984 Long term (current) use of oral hypoglycemic drugs: Secondary | ICD-10-CM

## 2018-06-06 DIAGNOSIS — Z751 Person awaiting admission to adequate facility elsewhere: Secondary | ICD-10-CM

## 2018-06-06 DIAGNOSIS — R471 Dysarthria and anarthria: Secondary | ICD-10-CM | POA: Diagnosis present

## 2018-06-06 DIAGNOSIS — J690 Pneumonitis due to inhalation of food and vomit: Secondary | ICD-10-CM | POA: Diagnosis not present

## 2018-06-06 DIAGNOSIS — R0602 Shortness of breath: Secondary | ICD-10-CM

## 2018-06-06 DIAGNOSIS — Z8249 Family history of ischemic heart disease and other diseases of the circulatory system: Secondary | ICD-10-CM

## 2018-06-06 DIAGNOSIS — F17210 Nicotine dependence, cigarettes, uncomplicated: Secondary | ICD-10-CM | POA: Diagnosis present

## 2018-06-06 DIAGNOSIS — M10071 Idiopathic gout, right ankle and foot: Secondary | ICD-10-CM | POA: Diagnosis not present

## 2018-06-06 DIAGNOSIS — E785 Hyperlipidemia, unspecified: Secondary | ICD-10-CM | POA: Diagnosis present

## 2018-06-06 DIAGNOSIS — I361 Nonrheumatic tricuspid (valve) insufficiency: Secondary | ICD-10-CM | POA: Diagnosis not present

## 2018-06-06 DIAGNOSIS — I63511 Cerebral infarction due to unspecified occlusion or stenosis of right middle cerebral artery: Secondary | ICD-10-CM | POA: Diagnosis present

## 2018-06-06 DIAGNOSIS — Z978 Presence of other specified devices: Secondary | ICD-10-CM

## 2018-06-06 DIAGNOSIS — Z811 Family history of alcohol abuse and dependence: Secondary | ICD-10-CM

## 2018-06-06 DIAGNOSIS — I480 Paroxysmal atrial fibrillation: Secondary | ICD-10-CM | POA: Diagnosis not present

## 2018-06-06 DIAGNOSIS — I4819 Other persistent atrial fibrillation: Secondary | ICD-10-CM | POA: Diagnosis present

## 2018-06-06 DIAGNOSIS — N179 Acute kidney failure, unspecified: Secondary | ICD-10-CM

## 2018-06-06 DIAGNOSIS — E876 Hypokalemia: Secondary | ICD-10-CM | POA: Diagnosis present

## 2018-06-06 DIAGNOSIS — Z8711 Personal history of peptic ulcer disease: Secondary | ICD-10-CM

## 2018-06-06 DIAGNOSIS — Z823 Family history of stroke: Secondary | ICD-10-CM

## 2018-06-06 DIAGNOSIS — R29810 Facial weakness: Secondary | ICD-10-CM | POA: Diagnosis present

## 2018-06-06 DIAGNOSIS — J189 Pneumonia, unspecified organism: Secondary | ICD-10-CM

## 2018-06-06 DIAGNOSIS — Z6826 Body mass index (BMI) 26.0-26.9, adult: Secondary | ICD-10-CM

## 2018-06-06 DIAGNOSIS — Z01818 Encounter for other preprocedural examination: Secondary | ICD-10-CM

## 2018-06-06 DIAGNOSIS — D72829 Elevated white blood cell count, unspecified: Secondary | ICD-10-CM

## 2018-06-06 DIAGNOSIS — M1990 Unspecified osteoarthritis, unspecified site: Secondary | ICD-10-CM | POA: Diagnosis present

## 2018-06-06 DIAGNOSIS — J9690 Respiratory failure, unspecified, unspecified whether with hypoxia or hypercapnia: Secondary | ICD-10-CM

## 2018-06-06 DIAGNOSIS — I6930 Unspecified sequelae of cerebral infarction: Secondary | ICD-10-CM | POA: Diagnosis present

## 2018-06-06 LAB — ABO/RH: ABO/RH(D): O POS

## 2018-06-06 LAB — URINALYSIS, ROUTINE W REFLEX MICROSCOPIC
Bacteria, UA: NONE SEEN
Bilirubin Urine: NEGATIVE
Glucose, UA: 150 mg/dL — AB
Ketones, ur: 20 mg/dL — AB
Leukocytes,Ua: NEGATIVE
Nitrite: NEGATIVE
Protein, ur: >=300 mg/dL — AB
Specific Gravity, Urine: 1.027 (ref 1.005–1.030)
pH: 6 (ref 5.0–8.0)

## 2018-06-06 LAB — CBC WITH DIFFERENTIAL/PLATELET
Abs Immature Granulocytes: 0.06 10*3/uL (ref 0.00–0.07)
Basophils Absolute: 0 10*3/uL (ref 0.0–0.1)
Basophils Relative: 0 %
Eosinophils Absolute: 0 10*3/uL (ref 0.0–0.5)
Eosinophils Relative: 0 %
HCT: 43.6 % (ref 39.0–52.0)
Hemoglobin: 14.2 g/dL (ref 13.0–17.0)
Immature Granulocytes: 0 %
Lymphocytes Relative: 7 %
Lymphs Abs: 1.2 10*3/uL (ref 0.7–4.0)
MCH: 30.3 pg (ref 26.0–34.0)
MCHC: 32.6 g/dL (ref 30.0–36.0)
MCV: 93.2 fL (ref 80.0–100.0)
Monocytes Absolute: 1.5 10*3/uL — ABNORMAL HIGH (ref 0.1–1.0)
Monocytes Relative: 9 %
Neutro Abs: 13.1 10*3/uL — ABNORMAL HIGH (ref 1.7–7.7)
Neutrophils Relative %: 84 %
Platelets: 240 10*3/uL (ref 150–400)
RBC: 4.68 MIL/uL (ref 4.22–5.81)
RDW: 13.4 % (ref 11.5–15.5)
WBC: 15.9 10*3/uL — ABNORMAL HIGH (ref 4.0–10.5)
nRBC: 0 % (ref 0.0–0.2)

## 2018-06-06 LAB — CBG MONITORING, ED: Glucose-Capillary: 104 mg/dL — ABNORMAL HIGH (ref 70–99)

## 2018-06-06 LAB — POCT I-STAT 7, (LYTES, BLD GAS, ICA,H+H)
Acid-Base Excess: 1 mmol/L (ref 0.0–2.0)
Bicarbonate: 24.1 mmol/L (ref 20.0–28.0)
Calcium, Ion: 1.17 mmol/L (ref 1.15–1.40)
HCT: 42 % (ref 39.0–52.0)
Hemoglobin: 14.3 g/dL (ref 13.0–17.0)
O2 Saturation: 91 %
Patient temperature: 99
Potassium: 3.3 mmol/L — ABNORMAL LOW (ref 3.5–5.1)
Sodium: 140 mmol/L (ref 135–145)
TCO2: 25 mmol/L (ref 22–32)
pCO2 arterial: 33 mmHg (ref 32.0–48.0)
pH, Arterial: 7.472 — ABNORMAL HIGH (ref 7.350–7.450)
pO2, Arterial: 57 mmHg — ABNORMAL LOW (ref 83.0–108.0)

## 2018-06-06 LAB — COMPREHENSIVE METABOLIC PANEL
ALT: 22 U/L (ref 0–44)
AST: 60 U/L — ABNORMAL HIGH (ref 15–41)
Albumin: 4.1 g/dL (ref 3.5–5.0)
Alkaline Phosphatase: 67 U/L (ref 38–126)
Anion gap: 15 (ref 5–15)
BUN: 16 mg/dL (ref 6–20)
CO2: 26 mmol/L (ref 22–32)
Calcium: 9.5 mg/dL (ref 8.9–10.3)
Chloride: 99 mmol/L (ref 98–111)
Creatinine, Ser: 1.16 mg/dL (ref 0.61–1.24)
GFR calc Af Amer: >60 mL/min (ref >60)
GFR calc non Af Amer: >60 mL/min (ref >60)
Glucose, Bld: 125 mg/dL — ABNORMAL HIGH (ref 70–99)
Potassium: 4 mmol/L (ref 3.5–5.1)
Sodium: 140 mmol/L (ref 135–145)
Total Bilirubin: 1.9 mg/dL — ABNORMAL HIGH (ref 0.3–1.2)
Total Protein: 6.7 g/dL (ref 6.5–8.1)

## 2018-06-06 LAB — PROTIME-INR
INR: 1.1 (ref 0.8–1.2)
Prothrombin Time: 14.3 seconds (ref 11.4–15.2)

## 2018-06-06 LAB — SARS CORONAVIRUS 2 BY RT PCR (HOSPITAL ORDER, PERFORMED IN ~~LOC~~ HOSPITAL LAB): SARS Coronavirus 2: NEGATIVE

## 2018-06-06 LAB — RAPID URINE DRUG SCREEN, HOSP PERFORMED
Amphetamines: NOT DETECTED
Barbiturates: NOT DETECTED
Benzodiazepines: NOT DETECTED
Cocaine: NOT DETECTED
Opiates: NOT DETECTED
Tetrahydrocannabinol: NOT DETECTED

## 2018-06-06 LAB — TYPE AND SCREEN
ABO/RH(D): O POS
Antibody Screen: NEGATIVE

## 2018-06-06 LAB — DIGOXIN LEVEL: Digoxin Level: 0.4 ng/mL — ABNORMAL LOW (ref 0.8–2.0)

## 2018-06-06 LAB — BRAIN NATRIURETIC PEPTIDE: B Natriuretic Peptide: 1124.6 pg/mL — ABNORMAL HIGH (ref 0.0–100.0)

## 2018-06-06 LAB — LIPASE, BLOOD: Lipase: 22 U/L (ref 11–51)

## 2018-06-06 LAB — I-STAT TROPONIN, ED: Troponin i, poc: 0.24 ng/mL (ref 0.00–0.08)

## 2018-06-06 LAB — I-STAT BETA HCG BLOOD, ED (MC, WL, AP ONLY): I-stat hCG, quantitative: <5 m[IU]/mL (ref <5)

## 2018-06-06 LAB — LACTIC ACID, PLASMA: Lactic Acid, Venous: 3.9 mmol/L (ref 0.5–1.9)

## 2018-06-06 LAB — ETHANOL: Alcohol, Ethyl (B): <10 mg/dL (ref <10)

## 2018-06-06 MED ORDER — INSULIN ASPART 100 UNIT/ML ~~LOC~~ SOLN
0.0000 [IU] | SUBCUTANEOUS | Status: DC
  Administered 2018-06-07 (×3): 1 [IU] via SUBCUTANEOUS
  Administered 2018-06-07: 13:00:00 2 [IU] via SUBCUTANEOUS
  Administered 2018-06-08 – 2018-06-09 (×5): 1 [IU] via SUBCUTANEOUS
  Administered 2018-06-09: 09:00:00 2 [IU] via SUBCUTANEOUS

## 2018-06-06 MED ORDER — DIGOXIN 125 MCG PO TABS
125.0000 ug | ORAL_TABLET | Freq: Every day | ORAL | Status: DC
  Filled 2018-06-06: qty 1

## 2018-06-06 MED ORDER — LORAZEPAM 1 MG PO TABS: 1.0000 mg | ORAL_TABLET | Freq: Four times a day (QID) | ORAL | Status: DC | PRN

## 2018-06-06 MED ORDER — ACETAMINOPHEN 650 MG RE SUPP: 650.0000 mg | RECTAL | Status: DC | PRN

## 2018-06-06 MED ORDER — SODIUM CHLORIDE 0.9 % IV BOLUS
500.0000 mL | Freq: Once | INTRAVENOUS | Status: AC
  Administered 2018-06-06: 23:00:00 500 mL via INTRAVENOUS

## 2018-06-06 MED ORDER — SODIUM CHLORIDE 0.9 % IV BOLUS
500.0000 mL | Freq: Once | INTRAVENOUS | Status: AC
  Administered 2018-06-06: 20:00:00 500 mL via INTRAVENOUS

## 2018-06-06 MED ORDER — VITAMIN B-1 100 MG PO TABS: 100.0000 mg | ORAL_TABLET | Freq: Every day | ORAL | Status: DC

## 2018-06-06 MED ORDER — ACETAMINOPHEN 325 MG PO TABS
650.0000 mg | ORAL_TABLET | ORAL | Status: DC | PRN
  Administered 2018-06-14: 650 mg via ORAL
  Filled 2018-06-06 (×3): qty 2

## 2018-06-06 MED ORDER — ASPIRIN 325 MG PO TABS
325.0000 mg | ORAL_TABLET | Freq: Every day | ORAL | Status: DC
  Administered 2018-06-07: 13:00:00 325 mg via ORAL
  Filled 2018-06-06 (×2): qty 1

## 2018-06-06 MED ORDER — ACETAMINOPHEN 160 MG/5ML PO SOLN
650.0000 mg | ORAL | Status: DC | PRN
  Administered 2018-06-12 – 2018-06-14 (×4): 650 mg
  Filled 2018-06-06 (×4): qty 20.3

## 2018-06-06 MED ORDER — THIAMINE HCL 100 MG/ML IJ SOLN
100.0000 mg | Freq: Every day | INTRAMUSCULAR | Status: DC
  Administered 2018-06-07 – 2018-06-11 (×5): 100 mg via INTRAVENOUS
  Filled 2018-06-06 (×5): qty 2

## 2018-06-06 MED ORDER — SENNOSIDES-DOCUSATE SODIUM 8.6-50 MG PO TABS: 1.0000 | ORAL_TABLET | Freq: Every evening | ORAL | Status: DC | PRN

## 2018-06-06 MED ORDER — FOLIC ACID 1 MG PO TABS: 1.0000 mg | ORAL_TABLET | Freq: Every day | ORAL | Status: DC

## 2018-06-06 MED ORDER — ASPIRIN 300 MG RE SUPP: 300.0000 mg | Freq: Every day | RECTAL | Status: DC

## 2018-06-06 MED ORDER — SODIUM CHLORIDE 0.9 % IV SOLN
INTRAVENOUS | Status: AC
  Administered 2018-06-07: 01:00:00 via INTRAVENOUS

## 2018-06-06 MED ORDER — LORAZEPAM 2 MG/ML IJ SOLN: 1.0000 mg | Freq: Four times a day (QID) | INTRAMUSCULAR | Status: DC | PRN

## 2018-06-06 MED ORDER — ATORVASTATIN CALCIUM 40 MG PO TABS: 40.0000 mg | ORAL_TABLET | Freq: Every day | ORAL | Status: DC

## 2018-06-06 MED ORDER — PANTOPRAZOLE SODIUM 40 MG PO TBEC: 40.0000 mg | DELAYED_RELEASE_TABLET | Freq: Two times a day (BID) | ORAL | Status: DC

## 2018-06-06 MED ORDER — ADULT MULTIVITAMIN W/MINERALS CH: 1.0000 | ORAL_TABLET | Freq: Every day | ORAL | Status: DC

## 2018-06-06 MED ORDER — HEPARIN SODIUM (PORCINE) 5000 UNIT/ML IJ SOLN
5000.0000 [IU] | Freq: Three times a day (TID) | INTRAMUSCULAR | Status: DC
  Administered 2018-06-07 – 2018-06-17 (×28): 5000 [IU] via SUBCUTANEOUS
  Filled 2018-06-06 (×29): qty 1

## 2018-06-06 MED ORDER — STROKE: EARLY STAGES OF RECOVERY BOOK
Freq: Once | Status: AC
  Administered 2018-06-07: 03:00:00
  Filled 2018-06-06: qty 1

## 2018-06-06 NOTE — ED Notes (Signed)
RN updated daughter 

## 2018-06-06 NOTE — ED Notes (Signed)
Patient transported to CT 
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
## 2018-06-06 NOTE — ED Notes (Signed)
RN started pt on 4L [place] dt sats being in mid 33's
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
## 2018-06-06 NOTE — ED Notes (Signed)
RN updated wife

## 2018-06-06 NOTE — ED Triage Notes (Signed)
Pt presents to ED by [facility] c/o fall. Per EMS pt fell unknown cause, wife found him after being gone all day from work. Per EMS pt was in A. Fib. Per family hx alcohol abuse.   Pt says his heart was beating fast, he became sweaty, then fell. Pt says he fell in the shower and has been laying there all night and day. Pt says he thinks he hit his back. Does not think he lost consciousness. Pt says he has no history of A. Fib. - blood thinners.

## 2018-06-06 NOTE — Consult Note (Signed)
Neurology Consultation Reason for Consult: Stroke Referring Physician: Demaris Callander  CC: Stroke  History is obtained from: Patient, referring provider  HPI: Robert [name] is a 58 y.o. male who was last seen well by his wife the night of 5/26.  She states that she "heard him moving around" and therefore thinks that he was okay around 9:30 PM that night.  He states that he was on the ground "all night and all day."  She left work around 8 AM and had not seen him up and around, but this is not unusual for her.  When she returned home that afternoon, she found him on the ground.  He has a history of atrial fibrillation, but is not on anticoagulation due to a history of a bleeding ulcer (per patient).  In the emergency department, a head CT was obtained which demonstrates established infarct in much of the right MCA territory   LKW: Unclear tpa given?: no, clear time of onset   ROS: A 14 point ROS was performed and is negative except as noted in the HPI.   Past Medical History:  Diagnosis Date  . Alcohol abuse    ongoing as of 07/2017  . Arthritis   . Atrial fibrillation ([place])    Not candidate for anticoagulation b/c of GI bleeding; had to get transfused.  Holding off on ASA until patient quits drinking.  . Gastric ulcer    hx of  . Gout    Usually MTP joint  . History of digital clubbing    "all my adult life"--CXR 08/2016 = bronchitic changes o/w normal.  . Hyperkalemia 04/2017   Normalized with decreasing lisinopril from 40 mg qd to 20 mg qd.  . Hyperlipidemia 07/2016   Holding off on statin until pt quits drinking.  [place] Kitchen Hypertension   . Nonischemic cardiomyopathy ([place])    resolved  . Nonrheumatic mitral valve regurgitation   . Osteoarthritis of right hip    Scheduled for surgery 05/11/15 but he then declined to get the surgery.  . Tobacco abuse    Quit 2014  . Type II diabetes mellitus ([place]) 07/2016   Dx'd by two fasting glucoses > 126 (Hb a1c 6.2% at that time).  Holding off on  ASA until patient quits drinking.     Family History  Problem Relation Age of Onset  . Diabetes Mother   . Congestive Heart Failure Mother   . Alcohol abuse Father   . Hypertension Father   . Stroke Father   . Heart disease Paternal Grandfather      Social History:  reports that he quit smoking about 5 years ago. His smoking use included cigarettes. He has a 5.00 pack-year smoking history. He has never used smokeless tobacco. He reports current alcohol use. He reports that he does not use drugs.   Exam: Current vital signs: BP (!) 153/113 (BP Location: Right Arm)   Pulse 88   Temp 99 F (37.2 C) (Oral)   Resp (!) 24   Ht 5\' 10"  (1.778 m)   Wt 84.8 kg   SpO2 94%   BMI 26.83 kg/m  Vital signs in last 24 hours: Temp:  [99 F (37.2 C)] 99 F (37.2 C) (05/27 1959) Pulse Rate:  [88-102] 88 (05/27 2130) Resp:  [24-25] 24 (05/27 2130) BP: (153)/(113) 153/113 (05/27 1959) SpO2:  [94 %-97 %] 94 % (05/27 2130) Weight:  [84.8 kg] 84.8 kg (05/27 2008)   Physical Exam  Constitutional: Appears well-developed and well-nourished.  Psych:  Affect appropriate to situation Eyes: No scleral injection HENT: No OP obstrucion Head: Normocephalic.  Cardiovascular: Normal rate and regular rhythm.  Respiratory: Effort normal, non-labored breathing GI: Soft.  No distension. There is no tenderness.  Skin: WDI  Neuro: Mental Status: Patient is awake, alert, oriented to person, place, gives month as January.  He does have neglect. Cranial Nerves: II: Visual Fields are full, though there is some visual neglect. Pupils are equal, round, and reactive to light.   III,IV, VI: EOMI without ptosis or diploplia, but he does have a right gaze preference, though he is able to cross midline to the left when tracking V: Facial sensation is diminished on the left to temperature VII: Facial movement with significant left facial weakness Motor: He has 4/5 strength of the left arm and leg   sensory: Sensation is diminished on the left to temperature, he perceives stimuli to the left leg as happening on the right leg, though able to identify stimuli to left arm, he does have double simultaneous stimulation extinction Cerebellar: Finger-nose-finger is consistent with weakness on the left, intact on the right  I have reviewed labs in epic and the results pertinent to this consultation are: CMP-unremarkable other than mildly elevated AST  I have reviewed the images obtained: CT head- fairly large right MCA distribution infarct  Impression: 58 year old male with right MCA distribution infarct now more than 24 hours out from onset of symptoms.  Though fairly large, the degree of edema he has currently does not seem to be pose immediate danger, I feel that every 2 hour neurochecks available on the floor would be adequate.  If he does end up developing malignant edema, he could be transferred to the ICU at that time.  He is not a candidate for mechanical thrombectomy given the size of already infarcted tissue on CT as well as time from onset.  Recommendations: - HgbA1c, fasting lipid panel - MRI of the brain without contrast - Frequent neuro checks - Echocardiogram - Prophylactic therapy-Antiplatelet med: Aspirin - dose 325mg  PO or 300mg  PR - Risk factor modification - Telemetry monitoring - PT consult, OT consult, Speech consult - Stroke team to follow   Roland Rack, MD [facility] [phone]  If 7pm- 7am, please page neurology on call as listed in [place].

## 2018-06-07 ENCOUNTER — Inpatient Hospital Stay (HOSPITAL_COMMUNITY): Payer: BLUE CROSS/BLUE SHIELD

## 2018-06-07 ENCOUNTER — Inpatient Hospital Stay: Payer: Self-pay

## 2018-06-07 ENCOUNTER — Encounter (HOSPITAL_COMMUNITY): Payer: BLUE CROSS/BLUE SHIELD

## 2018-06-07 DIAGNOSIS — J9601 Acute respiratory failure with hypoxia: Secondary | ICD-10-CM

## 2018-06-07 DIAGNOSIS — E119 Type 2 diabetes mellitus without complications: Secondary | ICD-10-CM

## 2018-06-07 DIAGNOSIS — I4891 Unspecified atrial fibrillation: Secondary | ICD-10-CM

## 2018-06-07 DIAGNOSIS — E785 Hyperlipidemia, unspecified: Secondary | ICD-10-CM

## 2018-06-07 DIAGNOSIS — I639 Cerebral infarction, unspecified: Secondary | ICD-10-CM

## 2018-06-07 DIAGNOSIS — J9621 Acute and chronic respiratory failure with hypoxia: Secondary | ICD-10-CM

## 2018-06-07 DIAGNOSIS — I63311 Cerebral infarction due to thrombosis of right middle cerebral artery: Secondary | ICD-10-CM

## 2018-06-07 DIAGNOSIS — J690 Pneumonitis due to inhalation of food and vomit: Secondary | ICD-10-CM

## 2018-06-07 LAB — POCT I-STAT 7, (LYTES, BLD GAS, ICA,H+H)
Acid-base deficit: 2 mmol/L (ref 0.0–2.0)
Bicarbonate: 23.1 mmol/L (ref 20.0–28.0)
Calcium, Ion: 1.17 mmol/L (ref 1.15–1.40)
HCT: 43 % (ref 39.0–52.0)
Hemoglobin: 14.6 g/dL (ref 13.0–17.0)
O2 Saturation: 100 %
Patient temperature: 98.6
Potassium: 3.5 mmol/L (ref 3.5–5.1)
Sodium: 139 mmol/L (ref 135–145)
TCO2: 24 mmol/L (ref 22–32)
pCO2 arterial: 37.8 mmHg (ref 32.0–48.0)
pH, Arterial: 7.394 (ref 7.350–7.450)
pO2, Arterial: 170 mmHg — ABNORMAL HIGH (ref 83.0–108.0)

## 2018-06-07 LAB — BASIC METABOLIC PANEL
Anion gap: 11 (ref 5–15)
BUN: 14 mg/dL (ref 6–20)
CO2: 28 mmol/L (ref 22–32)
Calcium: 9.1 mg/dL (ref 8.9–10.3)
Chloride: 104 mmol/L (ref 98–111)
Creatinine, Ser: 0.96 mg/dL (ref 0.61–1.24)
GFR calc Af Amer: >60 mL/min (ref >60)
GFR calc non Af Amer: >60 mL/min (ref >60)
Glucose, Bld: 130 mg/dL — ABNORMAL HIGH (ref 70–99)
Potassium: 3.5 mmol/L (ref 3.5–5.1)
Sodium: 143 mmol/L (ref 135–145)

## 2018-06-07 LAB — LIPID PANEL
Cholesterol: 198 mg/dL (ref 0–200)
HDL: 81 mg/dL (ref >40)
LDL Cholesterol: 103 mg/dL — ABNORMAL HIGH (ref 0–99)
Total CHOL/HDL Ratio: 2.4 RATIO
Triglycerides: 72 mg/dL (ref <150)
VLDL: 14 mg/dL (ref 0–40)

## 2018-06-07 LAB — GLUCOSE, CAPILLARY
Glucose-Capillary: 118 mg/dL — ABNORMAL HIGH (ref 70–99)
Glucose-Capillary: 126 mg/dL — ABNORMAL HIGH (ref 70–99)
Glucose-Capillary: 127 mg/dL — ABNORMAL HIGH (ref 70–99)
Glucose-Capillary: 127 mg/dL — ABNORMAL HIGH (ref 70–99)
Glucose-Capillary: 136 mg/dL — ABNORMAL HIGH (ref 70–99)
Glucose-Capillary: 136 mg/dL — ABNORMAL HIGH (ref 70–99)
Glucose-Capillary: 146 mg/dL — ABNORMAL HIGH (ref 70–99)
Glucose-Capillary: 149 mg/dL — ABNORMAL HIGH (ref 70–99)

## 2018-06-07 LAB — LACTIC ACID, PLASMA
Lactic Acid, Venous: 2.5 mmol/L (ref 0.5–1.9)
Lactic Acid, Venous: 3 mmol/L (ref 0.5–1.9)
Lactic Acid, Venous: 3.2 mmol/L (ref 0.5–1.9)

## 2018-06-07 LAB — MRSA PCR SCREENING: MRSA by PCR: NEGATIVE

## 2018-06-07 LAB — CBC
HCT: 45.6 % (ref 39.0–52.0)
Hemoglobin: 15.4 g/dL (ref 13.0–17.0)
MCH: 30.8 pg (ref 26.0–34.0)
MCHC: 33.8 g/dL (ref 30.0–36.0)
MCV: 91.2 fL (ref 80.0–100.0)
Platelets: 247 10*3/uL (ref 150–400)
RBC: 5 MIL/uL (ref 4.22–5.81)
RDW: 13.5 % (ref 11.5–15.5)
WBC: 19.2 10*3/uL — ABNORMAL HIGH (ref 4.0–10.5)
nRBC: 0 % (ref 0.0–0.2)

## 2018-06-07 LAB — HIV ANTIBODY (ROUTINE TESTING W REFLEX): HIV Screen 4th Generation wRfx: NONREACTIVE

## 2018-06-07 LAB — CK: Total CK: 2395 U/L — ABNORMAL HIGH (ref 49–397)

## 2018-06-07 LAB — HEMOGLOBIN A1C
Hgb A1c MFr Bld: 5.5 % (ref 4.8–5.6)
Mean Plasma Glucose: 111.15 mg/dL

## 2018-06-07 LAB — MAGNESIUM: Magnesium: 1.3 mg/dL — ABNORMAL LOW (ref 1.7–2.4)

## 2018-06-07 LAB — TROPONIN I
Troponin I: 0.74 ng/mL (ref <0.03)
Troponin I: 0.54 ng/mL (ref <0.03)
Troponin I: 0.61 ng/mL (ref <0.03)
Troponin I: 1.43 ng/mL (ref <0.03)

## 2018-06-07 LAB — DIGOXIN LEVEL: Digoxin Level: 0.5 ng/mL — ABNORMAL LOW (ref 0.8–2.0)

## 2018-06-07 IMAGING — DX PORTABLE CHEST - 1 VIEW
1 series · 1 of 1 positions shown · non-contrast
Comparison: Radiograph [DATE].

CLINICAL DATA: Shortness of breath.

EXAM:
PORTABLE CHEST 1 VIEW

[chest ap]
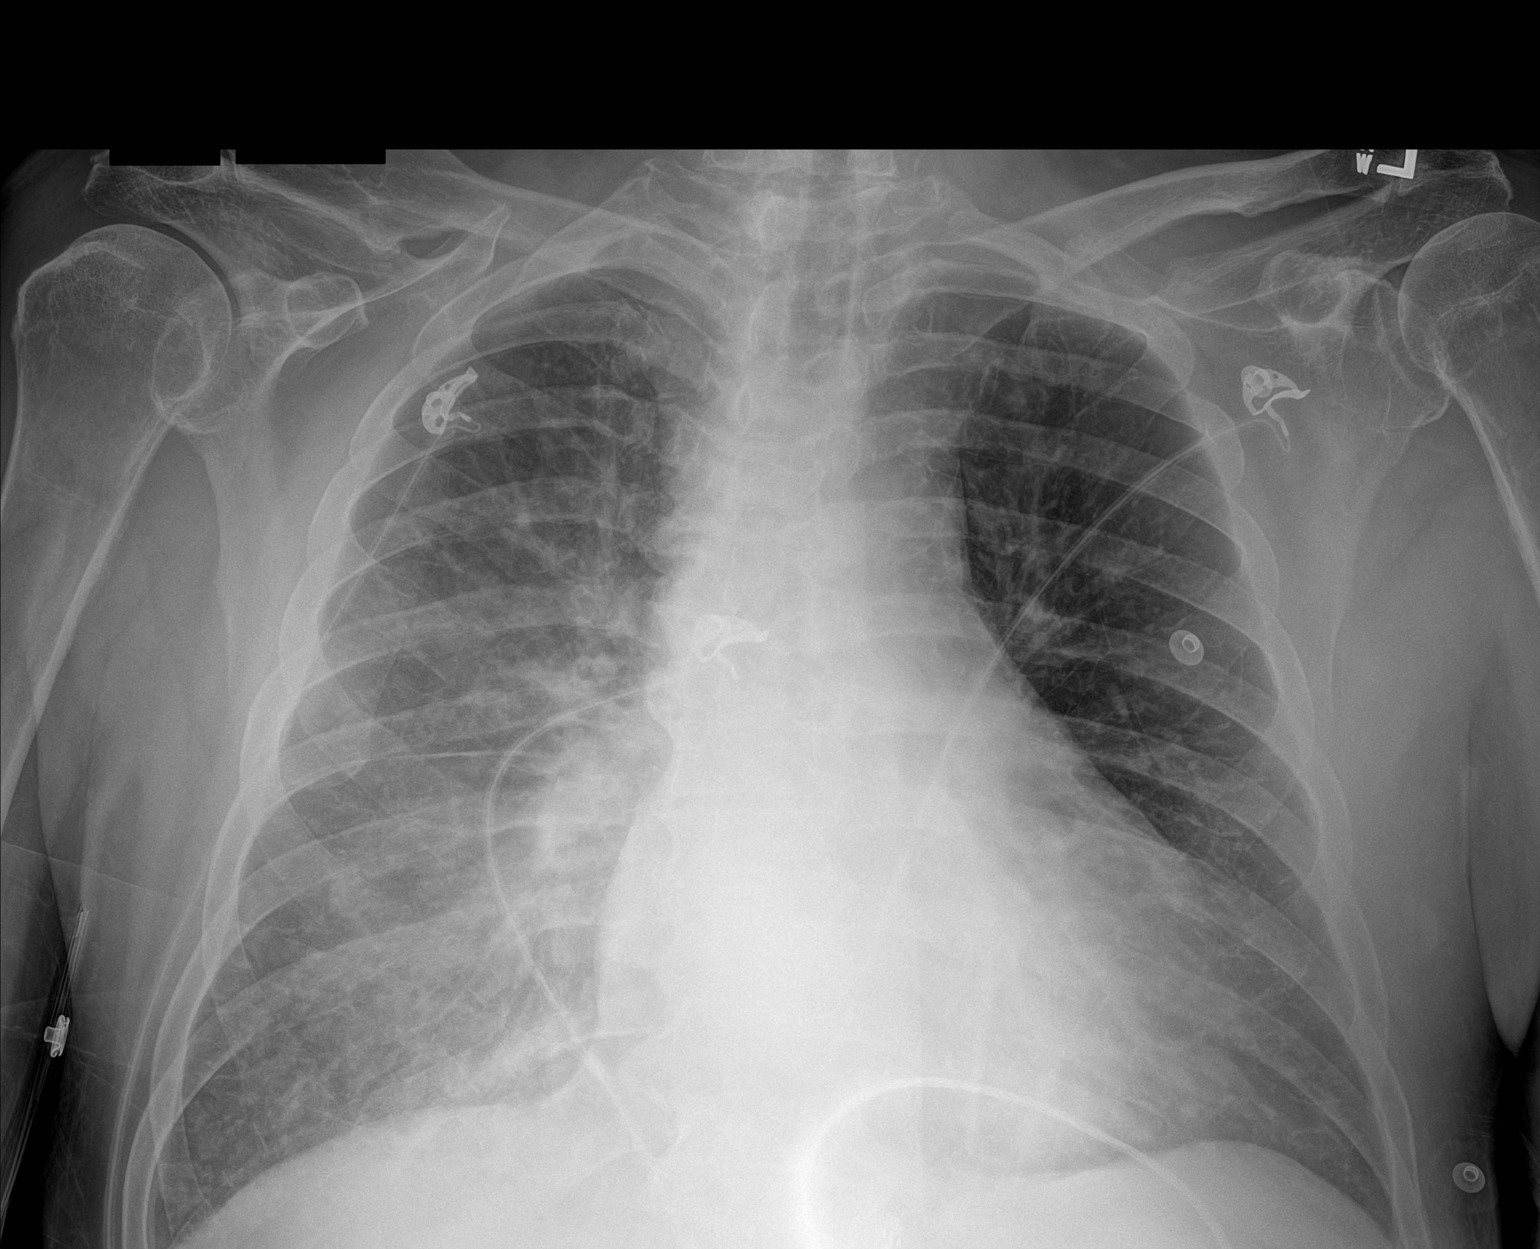

[1 of 1 positions shown; findings below may reference images not displayed]

FINDINGS: Stable cardiomediastinal silhouette. No pneumothorax or pleural
effusion is noted. Left lung is clear. Increased right lung opacity
is noted concerning for possible pneumonia or edema, as possible
Kerley B lines are noted. The visualized skeletal structures are
unremarkable.
IMPRESSION: Increased right lung opacity is noted concerning for possible
pneumonia or asymmetric edema.

## 2018-06-07 MED ORDER — CHLORHEXIDINE GLUCONATE 0.12% ORAL RINSE (MEDLINE KIT)
15.0000 mL | Freq: Two times a day (BID) | OROMUCOSAL | Status: DC
  Administered 2018-06-07 – 2018-06-10 (×8): 15 mL via OROMUCOSAL

## 2018-06-07 MED ORDER — FENTANYL CITRATE (PF) 100 MCG/2ML IJ SOLN
50.0000 ug | INTRAMUSCULAR | Status: DC | PRN
  Administered 2018-06-08 (×2): 50 ug via INTRAVENOUS
  Filled 2018-06-07 (×2): qty 2

## 2018-06-07 MED ORDER — DEXMEDETOMIDINE HCL IN NACL 200 MCG/50ML IV SOLN
0.0000 ug/kg/h | INTRAVENOUS | Status: DC
  Administered 2018-06-07: 21:00:00 0.9 ug/kg/h via INTRAVENOUS
  Administered 2018-06-07: 13:00:00 0.6 ug/kg/h via INTRAVENOUS
  Administered 2018-06-07: 11:00:00 0.4 ug/kg/h via INTRAVENOUS
  Administered 2018-06-07: 0.9 ug/kg/h via INTRAVENOUS
  Administered 2018-06-08: 0.7 ug/kg/h via INTRAVENOUS
  Administered 2018-06-08: 0.9 ug/kg/h via INTRAVENOUS
  Administered 2018-06-08 (×2): 0.5 ug/kg/h via INTRAVENOUS
  Administered 2018-06-08: 17:00:00 0.7 ug/kg/h via INTRAVENOUS
  Filled 2018-06-07 (×4): qty 50
  Filled 2018-06-07: qty 100
  Filled 2018-06-07 (×4): qty 50

## 2018-06-07 MED ORDER — PHENYLEPHRINE HCL-NACL 10-0.9 MG/250ML-% IV SOLN: 25.0000 ug/min | INTRAVENOUS | Status: DC

## 2018-06-07 MED ORDER — LACTATED RINGERS IV BOLUS
500.0000 mL | Freq: Once | INTRAVENOUS | Status: AC
  Administered 2018-06-07: 500 mL via INTRAVENOUS

## 2018-06-07 MED ORDER — MIDAZOLAM HCL 2 MG/2ML IJ SOLN
INTRAMUSCULAR | Status: AC
  Administered 2018-06-07: 10:00:00 2 mg
  Filled 2018-06-07: qty 2

## 2018-06-07 MED ORDER — VANCOMYCIN HCL 10 G IV SOLR
1250.0000 mg | Freq: Two times a day (BID) | INTRAVENOUS | Status: DC
  Filled 2018-06-07 (×2): qty 1250

## 2018-06-07 MED ORDER — SODIUM CHLORIDE 0.9 % IV SOLN
3.0000 g | Freq: Four times a day (QID) | INTRAVENOUS | Status: DC
  Administered 2018-06-07 – 2018-06-12 (×20): 3 g via INTRAVENOUS
  Filled 2018-06-07 (×24): qty 3

## 2018-06-07 MED ORDER — MIDAZOLAM HCL 2 MG/2ML IJ SOLN: 2.0000 mg | Freq: Once | INTRAMUSCULAR | Status: AC

## 2018-06-07 MED ORDER — DIGOXIN 0.05 MG/ML PO SOLN
0.1250 mg | Freq: Every day | ORAL | Status: DC
  Administered 2018-06-07 – 2018-06-09 (×3): 0.125 mg
  Filled 2018-06-07 (×4): qty 2.5

## 2018-06-07 MED ORDER — ORAL CARE MOUTH RINSE
15.0000 mL | OROMUCOSAL | Status: DC
  Administered 2018-06-07 – 2018-06-11 (×39): 15 mL via OROMUCOSAL

## 2018-06-07 MED ORDER — DEXTROSE 50 % IV SOLN
INTRAVENOUS | Status: AC
  Filled 2018-06-07: qty 50

## 2018-06-07 MED ORDER — PIPERACILLIN-TAZOBACTAM 3.375 G IVPB
3.3750 g | Freq: Three times a day (TID) | INTRAVENOUS | Status: DC
  Filled 2018-06-07: qty 50

## 2018-06-07 MED ORDER — SENNOSIDES-DOCUSATE SODIUM 8.6-50 MG PO TABS: 1.0000 | ORAL_TABLET | Freq: Every evening | ORAL | Status: DC | PRN

## 2018-06-07 MED ORDER — BISACODYL 10 MG RE SUPP: 10.0000 mg | Freq: Every day | RECTAL | Status: DC | PRN

## 2018-06-07 MED ORDER — ETOMIDATE 2 MG/ML IV SOLN
20.0000 mg | Freq: Once | INTRAVENOUS | Status: AC
  Administered 2018-06-07: 10:00:00 20 mg via INTRAVENOUS

## 2018-06-07 MED ORDER — IPRATROPIUM-ALBUTEROL 0.5-2.5 (3) MG/3ML IN SOLN
3.0000 mL | Freq: Four times a day (QID) | RESPIRATORY_TRACT | Status: DC | PRN
  Administered 2018-06-18: 3 mL via RESPIRATORY_TRACT
  Filled 2018-06-07: qty 3

## 2018-06-07 MED ORDER — FENTANYL CITRATE (PF) 100 MCG/2ML IJ SOLN
50.0000 ug | INTRAMUSCULAR | Status: AC | PRN
  Administered 2018-06-07 (×3): 50 ug via INTRAVENOUS
  Filled 2018-06-07 (×3): qty 2

## 2018-06-07 MED ORDER — VANCOMYCIN HCL 10 G IV SOLR
1750.0000 mg | Freq: Once | INTRAVENOUS | Status: DC
  Filled 2018-06-07: qty 1750

## 2018-06-07 MED ORDER — ONDANSETRON HCL 4 MG/2ML IJ SOLN
4.0000 mg | Freq: Four times a day (QID) | INTRAMUSCULAR | Status: DC | PRN
  Administered 2018-06-07: 09:00:00 4 mg via INTRAVENOUS

## 2018-06-07 MED ORDER — SODIUM CHLORIDE 0.9 % IV SOLN
250.0000 mL | INTRAVENOUS | Status: DC
  Administered 2018-06-10: 08:00:00 250 mL via INTRAVENOUS

## 2018-06-07 MED ORDER — FOLIC ACID 1 MG PO TABS
1.0000 mg | ORAL_TABLET | Freq: Every day | ORAL | Status: DC
  Administered 2018-06-08 – 2018-06-20 (×11): 1 mg
  Filled 2018-06-07 (×11): qty 1

## 2018-06-07 MED ORDER — LACTATED RINGERS IV BOLUS
500.0000 mL | Freq: Once | INTRAVENOUS | Status: AC
  Administered 2018-06-07: 11:00:00 500 mL via INTRAVENOUS

## 2018-06-07 MED ORDER — LACTATED RINGERS IV SOLN
INTRAVENOUS | Status: DC
  Administered 2018-06-07 – 2018-06-08 (×2): via INTRAVENOUS

## 2018-06-07 MED ORDER — ORAL CARE MOUTH RINSE
15.0000 mL | OROMUCOSAL | Status: DC
  Administered 2018-06-07: 10:00:00 15 mL via OROMUCOSAL

## 2018-06-07 MED ORDER — ONDANSETRON HCL 4 MG/2ML IJ SOLN
INTRAMUSCULAR | Status: AC
  Filled 2018-06-07: qty 2

## 2018-06-07 MED ORDER — DOCUSATE SODIUM 50 MG/5ML PO LIQD: 100.0000 mg | Freq: Two times a day (BID) | ORAL | Status: DC | PRN

## 2018-06-07 MED ORDER — LABETALOL HCL 5 MG/ML IV SOLN
10.0000 mg | INTRAVENOUS | Status: DC | PRN
  Administered 2018-06-07: 03:00:00 10 mg via INTRAVENOUS
  Filled 2018-06-07: qty 4

## 2018-06-07 MED ORDER — PROPOFOL 1000 MG/100ML IV EMUL
INTRAVENOUS | Status: AC
  Filled 2018-06-07: qty 100

## 2018-06-07 MED ORDER — IOHEXOL 350 MG/ML SOLN
150.0000 mL | Freq: Once | INTRAVENOUS | Status: AC | PRN
  Administered 2018-06-07: 21:00:00 150 mL via INTRAVENOUS

## 2018-06-07 MED ORDER — METOPROLOL TARTRATE 5 MG/5ML IV SOLN
2.5000 mg | INTRAVENOUS | Status: DC | PRN
  Administered 2018-06-09 (×2): 2.5 mg via INTRAVENOUS
  Filled 2018-06-07 (×2): qty 5

## 2018-06-07 MED ORDER — ROCURONIUM BROMIDE 50 MG/5ML IV SOLN
50.0000 mg | Freq: Once | INTRAVENOUS | Status: AC
  Administered 2018-06-07: 10:00:00 50 mg via INTRAVENOUS

## 2018-06-07 MED ORDER — CHLORHEXIDINE GLUCONATE CLOTH 2 % EX PADS
6.0000 | MEDICATED_PAD | Freq: Every day | CUTANEOUS | Status: DC
  Administered 2018-06-07: 11:00:00 6 via TOPICAL

## 2018-06-07 MED ORDER — CHLORHEXIDINE GLUCONATE 0.12% ORAL RINSE (MEDLINE KIT)
15.0000 mL | Freq: Two times a day (BID) | OROMUCOSAL | Status: DC
  Administered 2018-06-07: 10:00:00 15 mL via OROMUCOSAL

## 2018-06-07 MED ORDER — ATORVASTATIN CALCIUM 40 MG PO TABS
40.0000 mg | ORAL_TABLET | Freq: Every day | ORAL | Status: DC
  Administered 2018-06-07 – 2018-06-19 (×11): 40 mg
  Filled 2018-06-07 (×11): qty 1

## 2018-06-07 MED ORDER — FENTANYL CITRATE (PF) 100 MCG/2ML IJ SOLN
INTRAMUSCULAR | Status: AC
  Administered 2018-06-07: 10:00:00 100 ug
  Filled 2018-06-07: qty 2

## 2018-06-07 MED ORDER — PANTOPRAZOLE SODIUM 40 MG PO PACK
40.0000 mg | PACK | Freq: Two times a day (BID) | ORAL | Status: DC
  Administered 2018-06-07 – 2018-06-09 (×5): 40 mg
  Filled 2018-06-07 (×5): qty 20

## 2018-06-07 MED ORDER — FENTANYL CITRATE (PF) 100 MCG/2ML IJ SOLN: 100.0000 ug | Freq: Once | INTRAMUSCULAR | Status: AC

## 2018-06-07 MED ORDER — ADULT MULTIVITAMIN W/MINERALS CH
1.0000 | ORAL_TABLET | Freq: Every day | ORAL | Status: DC
  Administered 2018-06-08 – 2018-06-20 (×11): 1
  Filled 2018-06-07 (×11): qty 1
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
## 2018-06-07 NOTE — Progress Notes (Signed)
EEG completed, results pending. 

## 2018-06-07 NOTE — Significant Event (Addendum)
Rapid Response Event Note  Overview: Respiratory Distress - Hypoxia  Initial Focused Assessment: Called by staff nurse about patient experiencing acute respiratory distress with hypoxia. Upon arrival, patient was coughing, wet gurgling sounds were heard from across the room, patient was acute respiratory distress - diaphoretic, clammy, mottled, + WOB, + SOB. Was already placed on a NRB 15L and oxygen saturations were 84-88% with RR in the low 40s, + accessory muscle use. Lung sounds - rhonchi throughout, RT > LT. HR in the 120-130s AF, SBP 150s. Patient endorsed nausea, was confused but previously was A/O x 3 per nurse, is currently following commands, able to move all extremities except RLE (due to hip pain). TRH MD was already at the bedside.   Interventions: -- STAT CXR -- STAT ABG -- Zofran 4 mg IV  -- 2nd PIV established -- ABG attempted x 4 (unable to obtain) -- MD consulted PCCM  Plan of Care: -- Transferred to 4N29 and was successfully intubated by PCCM MD.   Event Summary:  Call Time 0834 End Time 0835  End Time [place], Gisela

## 2018-06-07 NOTE — Procedures (Signed)
[place] A. Merlene Laughter, MD     [email or web address]           HISTORY: This is a 58 year old male who presents with a right MCA infarct and altered mental status.  The study is carried out for concern that the patient is having nonconvulsive seizures.  MEDICATIONS:  Current Facility-Administered Medications:  .  0.9 %  sodium chloride infusion, 250 mL, Intravenous, Continuous, Simpson, Paula B, NP .  acetaminophen (TYLENOL) tablet 650 mg, 650 mg, Oral, Q4H PRN **OR** acetaminophen (TYLENOL) solution 650 mg, 650 mg, Per Tube, Q4H PRN **OR** acetaminophen (TYLENOL) suppository 650 mg, 650 mg, Rectal, Q4H PRN, Posey Pronto, Vishal R, MD .  Ampicillin-Sulbactam (UNASYN) 3 g in sodium chloride 0.9 % 100 mL IVPB, 3 g, Intravenous, Q6H, Kara Mead V, MD, Last Rate: 200 mL/hr at 06/07/18 1611, 3 g at 06/07/18 1611 .  aspirin suppository 300 mg, 300 mg, Rectal, Daily **OR** aspirin tablet 325 mg, 325 mg, Oral, Daily, Zada Finders R, MD, 325 mg at 06/07/18 1313 .  atorvastatin (LIPITOR) tablet 40 mg, 40 mg, Per Tube, q1800, Jennelle Human B, NP .  bisacodyl (DULCOLAX) suppository 10 mg, 10 mg, Rectal, Daily PRN, Jennelle Human B, NP .  chlorhexidine gluconate (MEDLINE KIT) (PERIDEX) 0.12 % solution 15 mL, 15 mL, Mouth Rinse, BID, Simpson, Paula B, NP, 15 mL at 06/07/18 0945 .  chlorhexidine gluconate (MEDLINE KIT) (PERIDEX) 0.12 % solution 15 mL, 15 mL, Mouth Rinse, BID, Rigoberto Noel, MD, 15 mL at 06/07/18 0946 .  Chlorhexidine Gluconate Cloth 2 % PADS 6 each, 6 each, Topical, Daily, Rigoberto Noel, MD, 6 each at 06/07/18 1055 .  dexmedetomidine (PRECEDEX) 200 MCG/50ML (4 mcg/mL) infusion, 0-1.2 mcg/kg/hr, Intravenous, Continuous, Simpson, Paula B, NP, Last Rate: 12.72 mL/hr at 06/07/18 1600, 0.6 mcg/kg/hr at 06/07/18 1600 .  digoxin (LANOXIN) 0.05 MG/ML solution 0.125 mg, 0.125 mg, Per Tube, Daily, Jennelle Human B, NP, 0.125 mg at 06/07/18 1512 .  docusate (COLACE) 50 MG/5ML liquid  100 mg, 100 mg, Per Tube, BID PRN, Jennelle Human B, NP .  fentaNYL (SUBLIMAZE) injection 50 mcg, 50 mcg, Intravenous, Q15 min PRN, Jennelle Human B, NP, 50 mcg at 06/07/18 1119 .  fentaNYL (SUBLIMAZE) injection 50-200 mcg, 50-200 mcg, Intravenous, Q30 min PRN, Jennelle Human B, NP .  Derrill Memo ON 7/67/3419] folic acid (FOLVITE) tablet 1 mg, 1 mg, Per Tube, Daily, Jennelle Human B, NP .  heparin injection 5,000 Units, 5,000 Units, Subcutaneous, Q8H, [name], MD, 5,000 Units at 06/07/18 0254 .  insulin aspart (novoLOG) injection 0-9 Units, 0-9 Units, Subcutaneous, Q4H, [name], MD, 2 Units at 06/07/18 1235 .  ipratropium-albuterol (DUONEB) 0.5-2.5 (3) MG/3ML nebulizer solution 3 mL, 3 mL, Nebulization, Q6H PRN, Jennelle Human B, NP .  lactated ringers infusion, , Intravenous, Continuous, Jennelle Human B, NP, Last Rate: 75 mL/hr at 06/07/18 1514 .  MEDLINE mouth rinse, 15 mL, Mouth Rinse, 10 times per day, Jennelle Human B, NP, 15 mL at 06/07/18 1400 .  metoprolol tartrate (LOPRESSOR) injection 2.5-5 mg, 2.5-5 mg, Intravenous, Q4H PRN, Arnell Asal, NP .  Derrill Memo ON 06/08/2018] multivitamin with minerals tablet 1 tablet, 1 tablet, Per Tube, Daily, Jennelle Human B, NP .  ondansetron (ZOFRAN) 4 MG/2ML injection, , , ,  .  pantoprazole sodium (PROTONIX) 40 mg/20 mL oral suspension 40 mg, 40 mg, Per Tube, BID, Jennelle Human B, NP, 40 mg at 06/07/18 1313 .  phenylephrine (NEOSYNEPHRINE) 10-0.9 MG/250ML-% infusion, 25-200 mcg/min, Intravenous,  Titrated, Jennelle Human B, NP .  propofol (DIPRIVAN) 1000 MG/100ML infusion, , , ,  .  senna-docusate (Senokot-S) tablet 1 tablet, 1 tablet, Per Tube, QHS PRN, Jennelle Human B, NP .  [DISCONTINUED] thiamine (VITAMIN B-1) tablet 100 mg, 100 mg, Oral, Daily **OR** thiamine (B-1) injection 100 mg, 100 mg, Intravenous, Daily, Zada Finders R, MD, 100 mg at 06/07/18 1235     ANALYSIS: A 16 channel recording using standard 10 20 measurements is  conducted for 22 minutes.  The background activity gets as high as 7 Hz.  However, the recording is replete with 3-4 generalized delta slowing.  There is some beta activity observed in the frontal areas.  Photic stimulation and hyperventilation.  There is no focal or lateral slowing.  There is no epileptiform activity is observed.   IMPRESSION: 1.  This recording shows moderate global slowing indicating a moderate global encephalopathy.  However, no epileptiform discharges are observed.      Gina Costilla A. Merlene Laughter, M.D.  Diplomate, Tax adviser of Psychiatry and Neurology ( Neurology).
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
## 2018-06-07 NOTE — Progress Notes (Signed)
PT Cancellation Note  Patient Details Name: Robert [name] MRN: [id number] DOB: [birth year]   Cancelled Treatment:    Reason Eval/Treat Not Completed: Patient not medically ready. Noted pt intubated and transferred to Neuro ICU this morning. Will hold PT evaluation at this time and continue to follow for medical readiness to participate.    Thelma Comp 06/07/2018, 9:58 AM   Rolinda Roan, PT, DPT Acute Rehabilitation Services Pager: [phone] Office: [phone]

## 2018-06-07 NOTE — Procedures (Signed)
Intubation Procedure Note Starr Engel [id number] 22-Jul-1960  Procedure: Intubation Indications: Respiratory insufficiency  Procedure Details Consent: Risks of procedure as well as the alternatives and risks of each were explained to the (patient/caregiver).  Consent for procedure obtained. Time Out: Verified patient identification, verified procedure, site/side was marked, verified correct patient position, special equipment/implants available, medications/allergies/relevent history reviewed, required imaging and test results available.  Performed  Maximum sterile technique was used including gloves, hand hygiene and mask.  MAC and 3  Versed 2  fent 100 Etomidate 20 Rocuronium 50  Evaluation Hemodynamic Status: Transient hypotension treated with fluid; O2 sats: transiently fell during during procedure Patient's Current Condition: stable Complications: No apparent complications Patient did tolerate procedure well. Chest X-ray ordered to verify placement.  CXR: pending.   Leanna Sato Elsworth [place] 06/07/2018

## 2018-06-07 NOTE — Progress Notes (Signed)
OT Cancellation Note  Patient Details Name: Robert [name] MRN: [id number] DOB: [birth year]   Cancelled Treatment:    Reason Eval/Treat Not Completed: Medical issues which prohibited therapy. Patient not appropriate for OT evaluation this morning due to medical issues and has been transferred to the ICU. Will hold OT evaluation until tomorrow and reassess appropriateness.   Ailene Ravel, OTR/L,CBIS  [phone]  06/07/2018, 9:33 AM

## 2018-06-07 NOTE — Progress Notes (Addendum)
Pt transferred from 3W with NT and rapid response RN. No belongings with patient at time of transfer. NT from 3W stated she would check room upon return to 3W.    Pair of socks tubed via tube station in patient belonging back. Placed in room.

## 2018-06-07 NOTE — Progress Notes (Signed)
Pharmacy Antibiotic Note  Robert [name] is a 58 y.o. male admitted on 06/06/2018 with pneumonia.    Plan: Zosyn 3.375 gm iv q8 Vanc 1750 mg x 1 then 1250 mg q12h Monitor renal fx cx vanc lvls prn  Height: 5\' 10"  (177.8 cm) Weight: 187 lb (84.8 kg) IBW/kg (Calculated) : 73  Temp (24hrs), Avg:98.8 F (37.1 C), Min:98.6 F (37 C), Max:99 F (37.2 C)  Recent Labs  Lab 06/06/18 2051 06/06/18 2052 06/06/18 2212 06/07/18 0628  WBC 15.9*  --   --  19.2*  CREATININE 1.16  --   --  0.96  LATICACIDVEN  --  3.9* 2.5*  --     Estimated Creatinine Clearance: 86.6 mL/min (by C-G formula based on SCr of 0.96 mg/dL).    No Known Allergies  Vancomycin 1250 mg IV Q 12 hrs. Goal AUC 400-550. Expected AUC: 535 SCr used: 0.96  Levester Fresh, PharmD, BCPS, BCCCP Clinical Pharmacist [phone]  Please check AMION for all [place] numbers  06/07/2018 9:02 AM

## 2018-06-07 NOTE — Progress Notes (Signed)
Patient was transported to and from CT w/o complications. Uneventful trip, patient stable throughout.

## 2018-06-07 NOTE — Progress Notes (Signed)
Pharmacy Antibiotic Note  Robert [name] is a 58 y.o. male admitted on 06/06/2018 with likely aspiration pneumonia.  Patient now intubated with increasing WOB. Sending trach aspirate. Hemodynamics stable, afebrile, WBC up to 19.2  Plan: Stop vanc and zosyn per CCM Start Unasyn 3g q6h now Monitor renal fx cx vanc lvls prn  Height: 5\' 10"  (177.8 cm) Weight: 187 lb (84.8 kg) IBW/kg (Calculated) : 73  Temp (24hrs), Avg:98.8 F (37.1 C), Min:98.6 F (37 C), Max:99 F (37.2 C)  Recent Labs  Lab 06/06/18 2051 06/06/18 2052 06/06/18 2212 06/07/18 0628  WBC 15.9*  --   --  19.2*  CREATININE 1.16  --   --  0.96  LATICACIDVEN  --  3.9* 2.5*  --     Estimated Creatinine Clearance: 86.6 mL/min (by C-G formula based on SCr of 0.96 mg/dL).    No Known Allergies   Thank you for involving pharmacy in this patient's care.  Janae Bridgeman, PharmD PGY1 Pharmacy Resident Phone: [phone] 06/07/2018 10:47 AM

## 2018-06-07 NOTE — Evaluation (Signed)
SLP Cancellation Note  Patient Details Name: Robert [name] MRN: [id number] DOB: 12-Sep-1960   Cancelled treatment:       Reason Eval/Treat Not Completed: Medical issues which prohibited therapy(pt now intubated and transferred to 4 N, will continue efforts)   Macario Golds 06/07/2018, 10:27 AM   Luanna Salk, [facility] SLP [place] Pager [phone] Office [phone]

## 2018-06-07 NOTE — Progress Notes (Signed)
Assisted tele visit to patient with daughter.  Ovetta Bazzano P, RN  

## 2018-06-07 NOTE — Progress Notes (Signed)
Per radiologist, the patient cannot have a MRI due to metal  BB in eye seen on CT of head.

## 2018-06-07 NOTE — Consult Note (Addendum)
NAME:  Robert [name], MRN:  [id number], DOB:  18-Aug-1960, LOS: 1 ADMISSION DATE:  06/06/2018, CONSULTATION DATE:  06/07/2018 REFERRING MD:  TRH, CHIEF COMPLAINT:  Hypoxia/ resp distress  Brief History   47 yoM with Afib not on AC and ETOH abuse presented 5/27 with AMS found to have large subacute R MCA stroke.  He was admitted to medical floor by hospitalist with stroke team following.  On 5/28 had acute decompensation with hypoxia with CXR consistent with aspiration.  He was tx to ICU, PCCM consulted, and required intubation on arrival.   History of present illness   HPI obtained from medical chart review given patient's respiratory distress.   58 year old male with history of Afib not on AC 2/2 GIB, DMT2, PUD, HTN, HLD, gout, and ETOH abuse who presented from home on 5/27 with altered mental status and some slurred speech.  Patient lives in different rooms than his wife and last seen in normal 5/26 but she found him confused and laying on the ground.    His workup showed a large sub acute right MCA stroke.  His initial CXR was negative.  Neurology was consulted and he was admitted to medical floor to hospitalist service.  He remained NPO after failing swallow study. He has had positive troponin and BNP.  EKG with afib/ non acute.   Follow-up CTH showing evolving subacute R MCA infarction on the morning of 5/28.  Later that morning, he had acute hypoxic decompensation and respiratory distress requiring NRB and transferred to ICU with PCCM consultation.  He required emergent intubation on arrival to ICU.   Past Medical History  Afib not on AC 2/2 GIB, DMT2, PUD, HTN, HLD, gout, ETOH abuse, former smoker  Significant Hospital Events   5/27 Admitted 5/28 decompensated/ hypoxic/ intubated   Consults:  Neurology   Procedures:  5/28 ETT >>  Significant Diagnostic Tests:  5/27 CTH and cervical >> CT of the head: Changes consistent with acute to subacute ischemia in the distribution of the  right middle cerebral artery. Encephalomalacia in the right posterior parietal lobe is noted consistent with prior ischemia. CT of cervical spine: Large anterior osteophytes without acute Abnormality.  5/28 CTH >> Evolving subacute infarction of the right MCA territory without significant change from the baseline CT, and no evidence hemorrhagic transformation. Redemonstration encephalomalacia of right parietal region.  Micro Data:  5/27 SARS coronavirus 2>> negative 5/28 MRSA PCR >> 5/28 trach asp >>  Antimicrobials:  5/28 unasyn >>  Interim history/subjective:  Arrived from floor with rapid response RN on NRB with sats in the 80's  Objective   Blood pressure (!) 160/117, pulse (!) 111, temperature 98.6 F (37 C), temperature source Axillary, resp. rate 19, height 5\' 10"  (1.778 m), weight 84.8 kg, SpO2 98 %.       No intake or output data in the 24 hours ending 06/07/18 0935 Filed Weights   06/06/18 2008  Weight: 84.8 kg   Examination: General:  Ill appearing adult male in respiratory distress on NRB HEENT: MM pink/dry, pupils 3/reactive, slurred speech, audible rales Neuro:  Awake, verbalizes name, follows intermittent commands on both, MAE- weaker on left CV:  IRIR, no obv murmur, weak peripheral pulses PULM:  Tacypneic/ labored, diffuse rales GI: soft, non-tender, bs active  Extremities: cool/moist, mottled extremities,  no edema  Skin: no rashes   Resolved Hospital Problem list    Assessment & Plan:   Hypoxic respiratory failure in the setting of aspiration pneumonia/ R  sided  P:  tx to ICU, will intubate Full MV support, PRVC 8 ml/kg, rate 20 FiO2/ PEEP for sat goal  CXR and ABG post intubation VAP  PAD protocol as below with bowel regimen Daily SBT/ WUA if meets criteria   Aspiration Pneumonia  P:  Send trach asp Start Unasyn  Trend fever curve/ CBC  Subacute R MCA stroke - MRI unable to be performed due to metal object seen in Left eye on CT P:   Per Neurology Plans for EEG/ CTA head/ neck, holding on hypertonic saline for now Serial neuro exams, consider repeat CTH if neuro decompensation  SIRS/ sepsis +/- cardiogenic component given elevated troponin and BNP P:  Goal MAP >65 LR bolus 500 ml now Trend lactate Trend troponin and EKG Assess echo   At risk AKI Elevated CK/ rhabdo P:  LR at 75 ml/hr Monitor UOP/ BMP Trend CK May need foley  Afib not on AC given previous GIB P:  Tele monitoring Hold home lopressor till BP stabilized Dig level now, then restart digoxin Trend BMP  ETOH abuse P:  PAD protocol with precedex/ prn fentanyl for RASS goal  0/-1 Daily thiamine/ folate/ MVI Monitor for withdrawals  Hx PUD/ prior GIB P:  PPI BID   DMT2 P:  CBG q 4 SSI sensitive   HLD P:  Daily Lipitor   Best practice:  Diet: NPO Pain/Anxiety/Delirium protocol (if indicated): precedex/ prn fentanyl VAP protocol (if indicated): yes DVT prophylaxis: heparin SQ/ SCDs GI prophylaxis: PPI  Glucose control: CBG q 4 Mobility: BR Code Status: Full  Family Communication: will update wife Disposition: ICU  Labs   CBC: Recent Labs  Lab 06/06/18 2051 06/06/18 2058 06/07/18 0628  WBC 15.9*  --  19.2*  NEUTROABS 13.1*  --   --   HGB 14.2 14.3 15.4  HCT 43.6 42.0 45.6  MCV 93.2  --  91.2  PLT 240  --  665    Basic Metabolic Panel: Recent Labs  Lab 06/06/18 2051 06/06/18 2058 06/07/18 0628  NA 140 140 143  K 4.0 3.3* 3.5  CL 99  --  104  CO2 26  --  28  GLUCOSE 125*  --  130*  BUN 16  --  14  CREATININE 1.16  --  0.96  CALCIUM 9.5  --  9.1   GFR: Estimated Creatinine Clearance: 86.6 mL/min (by C-G formula based on SCr of 0.96 mg/dL). Recent Labs  Lab 06/06/18 2051 06/06/18 2052 06/06/18 2212 06/07/18 0628  WBC 15.9*  --   --  19.2*  LATICACIDVEN  --  3.9* 2.5*  --     Liver Function Tests: Recent Labs  Lab 06/06/18 2051  AST 60*  ALT 22  ALKPHOS 67  BILITOT 1.9*  PROT 6.7  ALBUMIN  4.1   Recent Labs  Lab 06/06/18 2051  LIPASE 22   No results for input(s): AMMONIA in the last 168 hours.  ABG    Component Value Date/Time   PHART 7.472 (H) 06/06/2018 2058   PCO2ART 33.0 06/06/2018 2058   PO2ART 57.0 (L) 06/06/2018 2058   HCO3 24.1 06/06/2018 2058   TCO2 25 06/06/2018 2058   O2SAT 91.0 06/06/2018 2058     Coagulation Profile: Recent Labs  Lab 06/06/18 2051  INR 1.1    Cardiac Enzymes: Recent Labs  Lab 06/06/18 2306 06/07/18 0628  CKTOTAL 2,395*  --   TROPONINI  --  0.74*    HbA1C: Hgb A1c MFr Bld  Date/Time Value Ref Range Status  06/07/2018 06:28 AM 5.5 4.8 - 5.6 % Final    Comment:    (NOTE) Pre diabetes:          5.7%-6.4% Diabetes:              >6.4% Glycemic control for   <7.0% adults with diabetes   02/09/2018 10:10 AM 5.6 4.6 - 6.5 % Final    Comment:    Glycemic Control Guidelines for People with Diabetes:Non Diabetic:  <6%Goal of Therapy: <7%Additional Action Suggested:  >8%     CBG: Recent Labs  Lab 06/06/18 2138 06/07/18 0040 06/07/18 0410 06/07/18 0812 06/07/18 0844  GLUCAP 104* 127* 118* 136* 149*    Review of Systems:   Unable  Past Medical History  He,  has a past medical history of Alcohol abuse, Arthritis, Atrial fibrillation ([place]), Gastric ulcer, Gout, History of digital clubbing, Hyperkalemia (04/2017), Hyperlipidemia (07/2016), Hypertension, Nonischemic cardiomyopathy ([place]), Nonrheumatic mitral valve regurgitation, Osteoarthritis of right hip, Tobacco abuse, and Type II diabetes mellitus ([place]) (07/2016).   Surgical History    Past Surgical History:  Procedure Laterality Date  . CARDIOVASCULAR STRESS TEST    . CARDIOVERSION     DC  . TONSILLECTOMY       Social History   reports that he quit smoking about 5 years ago. His smoking use included cigarettes. He has a 5.00 pack-year smoking history. He has never used smokeless tobacco. He reports current alcohol use. He reports that he does not use drugs.    Family History   His family history includes Alcohol abuse in his father; Congestive Heart Failure in his mother; Diabetes in his mother; Heart disease in his paternal grandfather; Hypertension in his father; Stroke in his father.   Allergies No Known Allergies   Home Medications  Prior to Admission medications   Medication Sig Start Date End Date Taking? Authorizing Provider  allopurinol (ZYLOPRIM) 100 MG tablet 2 tabs po qd 03/14/18   McGowen, Adrian Blackwater, MD  digoxin (LANOXIN) 0.125 MG tablet Take 1 tablet by mouth daily. 07/25/16   [provider]  glucose blood (CONTOUR NEXT TEST) test strip Use to check blood sugar once daily. 10/07/16   McGowen, Adrian Blackwater, MD  lisinopril (PRINIVIL,ZESTRIL) 40 MG tablet Take 1 tablet by mouth daily. 07/25/16   [provider]  metFORMIN (GLUCOPHAGE) 500 MG tablet TAKE 1 TABLET AT BEDTIME 08/14/17   McGowen, Adrian Blackwater, MD  metoprolol succinate (TOPROL-XL) 100 MG 24 hr tablet Take 1 tablet by mouth 2 (two) times daily. 07/25/16   [provider]  MICROLET LANCETS MISC Use to check blood sugar once daily 11/15/16   McGowen, Adrian Blackwater, MD  pantoprazole (PROTONIX) 40 MG tablet Take 40 mg by mouth 2 (two) times daily. 08/01/16   [provider]  predniSONE (DELTASONE) 20 MG tablet 2 tabs po qd x 5d, then 1 tab po qd x 5d 03/14/18   McGowen, Adrian Blackwater, MD     Critical care time: 39 mins     Kennieth Rad, MSN, AGACNP-BC [facility] Pgr: [phone] or if no answer [phone] 06/07/2018, 10:18 AM

## 2018-06-08 ENCOUNTER — Telehealth: Payer: Self-pay | Admitting: Family Medicine

## 2018-06-08 ENCOUNTER — Inpatient Hospital Stay (HOSPITAL_COMMUNITY): Payer: BLUE CROSS/BLUE SHIELD

## 2018-06-08 DIAGNOSIS — I340 Nonrheumatic mitral (valve) insufficiency: Secondary | ICD-10-CM

## 2018-06-08 DIAGNOSIS — I361 Nonrheumatic tricuspid (valve) insufficiency: Secondary | ICD-10-CM

## 2018-06-08 HISTORY — PX: TRANSTHORACIC ECHOCARDIOGRAM: SHX275

## 2018-06-08 LAB — RENAL FUNCTION PANEL
Albumin: 2.9 g/dL — ABNORMAL LOW (ref 3.5–5.0)
Anion gap: 12 (ref 5–15)
BUN: 12 mg/dL (ref 6–20)
CO2: 22 mmol/L (ref 22–32)
Calcium: 8.2 mg/dL — ABNORMAL LOW (ref 8.9–10.3)
Chloride: 105 mmol/L (ref 98–111)
Creatinine, Ser: 1 mg/dL (ref 0.61–1.24)
GFR calc Af Amer: >60 mL/min (ref >60)
GFR calc non Af Amer: >60 mL/min (ref >60)
Glucose, Bld: 137 mg/dL — ABNORMAL HIGH (ref 70–99)
Phosphorus: 2.1 mg/dL — ABNORMAL LOW (ref 2.5–4.6)
Potassium: 3.4 mmol/L — ABNORMAL LOW (ref 3.5–5.1)
Sodium: 139 mmol/L (ref 135–145)

## 2018-06-08 LAB — GLUCOSE, CAPILLARY
Glucose-Capillary: 109 mg/dL — ABNORMAL HIGH (ref 70–99)
Glucose-Capillary: 109 mg/dL — ABNORMAL HIGH (ref 70–99)
Glucose-Capillary: 121 mg/dL — ABNORMAL HIGH (ref 70–99)
Glucose-Capillary: 127 mg/dL — ABNORMAL HIGH (ref 70–99)
Glucose-Capillary: 128 mg/dL — ABNORMAL HIGH (ref 70–99)
Glucose-Capillary: 143 mg/dL — ABNORMAL HIGH (ref 70–99)

## 2018-06-08 LAB — COMPREHENSIVE METABOLIC PANEL
ALT: 15 U/L (ref 0–44)
AST: 30 U/L (ref 15–41)
Albumin: 2.9 g/dL — ABNORMAL LOW (ref 3.5–5.0)
Alkaline Phosphatase: 57 U/L (ref 38–126)
Anion gap: 14 (ref 5–15)
BUN: 36 mg/dL — ABNORMAL HIGH (ref 6–20)
CO2: 17 mmol/L — ABNORMAL LOW (ref 22–32)
Calcium: 8.4 mg/dL — ABNORMAL LOW (ref 8.9–10.3)
Chloride: 106 mmol/L (ref 98–111)
Creatinine, Ser: 1.57 mg/dL — ABNORMAL HIGH (ref 0.61–1.24)
GFR calc Af Amer: 55 mL/min — ABNORMAL LOW (ref >60)
GFR calc non Af Amer: 48 mL/min — ABNORMAL LOW (ref >60)
Glucose, Bld: 104 mg/dL — ABNORMAL HIGH (ref 70–99)
Potassium: 4.3 mmol/L (ref 3.5–5.1)
Sodium: 137 mmol/L (ref 135–145)
Total Bilirubin: 0.6 mg/dL (ref 0.3–1.2)
Total Protein: 5.6 g/dL — ABNORMAL LOW (ref 6.5–8.1)

## 2018-06-08 LAB — CBC
HCT: 38.3 % — ABNORMAL LOW (ref 39.0–52.0)
Hemoglobin: 12.8 g/dL — ABNORMAL LOW (ref 13.0–17.0)
MCH: 30.2 pg (ref 26.0–34.0)
MCHC: 33.4 g/dL (ref 30.0–36.0)
MCV: 90.3 fL (ref 80.0–100.0)
Platelets: 175 10*3/uL (ref 150–400)
RBC: 4.24 MIL/uL (ref 4.22–5.81)
RDW: 13.7 % (ref 11.5–15.5)
WBC: 11.3 10*3/uL — ABNORMAL HIGH (ref 4.0–10.5)
nRBC: 0 % (ref 0.0–0.2)

## 2018-06-08 LAB — PHOSPHORUS
Phosphorus: 4.9 mg/dL — ABNORMAL HIGH (ref 2.5–4.6)
Phosphorus: 2.7 mg/dL (ref 2.5–4.6)

## 2018-06-08 LAB — PROCALCITONIN: Procalcitonin: 0.37 ng/mL

## 2018-06-08 LAB — CK: Total CK: 768 U/L — ABNORMAL HIGH (ref 49–397)

## 2018-06-08 LAB — ECHOCARDIOGRAM COMPLETE BUBBLE STUDY
Height: 70 in
Weight: 2992 oz

## 2018-06-08 LAB — DIGOXIN LEVEL: Digoxin Level: 0.3 ng/mL — ABNORMAL LOW (ref 0.8–2.0)

## 2018-06-08 LAB — MAGNESIUM: Magnesium: 1.3 mg/dL — ABNORMAL LOW (ref 1.7–2.4)

## 2018-06-08 MED ORDER — LACTATED RINGERS IV SOLN
INTRAVENOUS | Status: DC
  Administered 2018-06-08: 20:00:00 via INTRAVENOUS

## 2018-06-08 MED ORDER — MAGNESIUM SULFATE 50 % IJ SOLN
3.0000 g | Freq: Once | INTRAVENOUS | Status: AC
  Administered 2018-06-08: 13:00:00 3 g via INTRAVENOUS
  Filled 2018-06-08: qty 6

## 2018-06-08 MED ORDER — POTASSIUM PHOSPHATES 15 MMOLE/5ML IV SOLN
30.0000 mmol | Freq: Once | INTRAVENOUS | Status: DC
  Administered 2018-06-08: 30 mmol via INTRAVENOUS
  Filled 2018-06-08: qty 10

## 2018-06-08 MED ORDER — ASPIRIN 300 MG RE SUPP
300.0000 mg | Freq: Every day | RECTAL | Status: DC
  Administered 2018-06-10 – 2018-06-11 (×2): 300 mg via RECTAL
  Filled 2018-06-08 (×2): qty 1

## 2018-06-08 MED ORDER — VITAL AF 1.2 CAL PO LIQD
1000.0000 mL | ORAL | Status: DC
  Administered 2018-06-08: 1000 mL

## 2018-06-08 MED ORDER — PERFLUTREN LIPID MICROSPHERE
1.0000 mL | INTRAVENOUS | Status: AC | PRN
  Administered 2018-06-08: 08:00:00 5 mL via INTRAVENOUS
  Filled 2018-06-08: qty 10

## 2018-06-08 MED ORDER — ASPIRIN 325 MG PO TABS
325.0000 mg | ORAL_TABLET | Freq: Every day | ORAL | Status: DC
  Administered 2018-06-08 – 2018-06-16 (×7): 325 mg
  Filled 2018-06-08 (×6): qty 1

## 2018-06-08 MED ORDER — DEXMEDETOMIDINE HCL IN NACL 400 MCG/100ML IV SOLN
0.0000 ug/kg/h | INTRAVENOUS | Status: DC
  Administered 2018-06-08: 20:00:00 0.7 ug/kg/h via INTRAVENOUS
  Administered 2018-06-09: 03:00:00 0.8 ug/kg/h via INTRAVENOUS
  Administered 2018-06-09: 08:00:00 0.7 ug/kg/h via INTRAVENOUS
  Administered 2018-06-10: 09:00:00 0.6 ug/kg/h via INTRAVENOUS
  Filled 2018-06-08 (×3): qty 100
  Filled 2018-06-08: qty 200

## 2018-06-08 MED ORDER — ASPIRIN 325 MG PO TABS: 325.0000 mg | ORAL_TABLET | Freq: Every day | ORAL | Status: DC

## 2018-06-08 MED ORDER — ASPIRIN 300 MG RE SUPP: 300.0000 mg | Freq: Every day | RECTAL | Status: DC

## 2018-06-08 MED ORDER — CHLORHEXIDINE GLUCONATE CLOTH 2 % EX PADS
6.0000 | MEDICATED_PAD | Freq: Every day | CUTANEOUS | Status: DC
  Administered 2018-06-08: 23:00:00 6 via TOPICAL

## 2018-06-08 NOTE — Progress Notes (Signed)
Assisted tele visit to patient with daughter.  Edy Belt P, RN  

## 2018-06-08 NOTE — Progress Notes (Addendum)
NAME:  Robert [name], MRN:  [id number], DOB:  1960-09-01, LOS: 2 ADMISSION DATE:  06/06/2018, CONSULTATION DATE:  06/07/2018 REFERRING MD:  TRH, CHIEF COMPLAINT:  Hypoxia/ resp distress  Brief History   58 yoM with Afib not on AC and ETOH abuse presented 5/27 with AMS found to have large subacute R MCA stroke.  He was admitted to medical floor by hospitalist with stroke team following.  On 5/28 had acute decompensation with hypoxia with CXR consistent with aspiration.  He was tx to ICU, PCCM consulted, and required intubation on arrival.   History of present illness   HPI obtained from medical chart review given patient's respiratory distress.   58 year old male with history of Afib not on AC 2/2 GIB, DMT2, PUD, HTN, HLD, gout, and ETOH abuse who presented from home on 5/27 with altered mental status and some slurred speech.  Patient lives in different rooms than his wife and last seen in normal 5/26 but she found him confused and laying on the ground.    His workup showed a large sub acute right MCA stroke.  His initial CXR was negative.  Neurology was consulted and he was admitted to medical floor to hospitalist service.  He remained NPO after failing swallow study. He has had positive troponin and BNP.  EKG with afib/ non acute.   Follow-up CTH showing evolving subacute R MCA infarction on the morning of 5/28.  Later that morning, he had acute hypoxic decompensation and respiratory distress requiring NRB and transferred to ICU with PCCM consultation.  He required emergent intubation on arrival to ICU.   Past Medical History  Afib not on AC 2/2 GIB, DMT2, PUD, HTN, HLD, gout, ETOH abuse, former smoker  Significant Hospital Events   5/27 Admitted 5/28 decompensated/ hypoxic/ intubated   Consults:  Neurology   Procedures:  5/28 ETT >>  Significant Diagnostic Tests:  5/27 CTH and cervical >> CT of the head: Changes consistent with acute to subacute ischemia in the distribution of the  right middle cerebral artery. Encephalomalacia in the right posterior parietal lobe is noted consistent with prior ischemia. CT of cervical spine: Large anterior osteophytes without acute Abnormality.  5/28 CTH >> Evolving subacute infarction of the right MCA territory without significant change from the baseline CT, and no evidence hemorrhagic transformation. Redemonstration encephalomalacia of right parietal region.  CT Chest 5/29>> No PE identified on today's exam. The main pulmonary artery is dilated which can be seen in patients with elevated PA pressures. Extensive bilateral multifocal consolidation as detailed above concerning for multifocal pneumonia or aspiration. Some debris is noted within the trachea. The endotracheal tube terminates somewhat high at the level of the thoracic inlet. Repositioning should be considered. Small to moderate-sized bilateral pleural effusions. Enteric tube terminates in the stomach. Cardiomegaly. There is reflux of contrast in the IVC consistent with some degree of underlying cardiac dysfunction.  Micro Data:  5/27 SARS coronavirus 2>> negative 5/28 MRSA PCR >> 5/28 trach asp >>  Antimicrobials:  5/28 unasyn >>  Interim history/subjective:  Stable on 40%, rate 20, 5 PEEP, TV 580 No SBT yet this am No pressor requirement Remains on Precedex at 0.5 mcg, appears comfortable   Objective   Blood pressure 138/88, pulse 82, temperature 99.5 F (37.5 C), temperature source Axillary, resp. rate 20, height 5\' 10"  (1.778 m), weight 84.8 kg, SpO2 99 %.    Vent Mode: PRVC FiO2 (%):  [40 %-100 %] 40 % Set Rate:  [20 bmp]  20 bmp Vt Set:  [580 mL] 580 mL PEEP:  [5 cmH20-8 cmH20] 5 cmH20 Plateau Pressure:  [17 cmH20-20 cmH20] 19 cmH20   Intake/Output Summary (Last 24 hours) at 06/08/2018 0850 Last data filed at 06/08/2018 0600 Gross per 24 hour  Intake 1990 ml  Output 500 ml  Net 1490 ml   Filed Weights   06/06/18 2008  Weight: 84.8 kg    Examination: General:  Adult male supine in bed, awake and alert, following commands HEENT: Oral ETT secure and intact, OG tube, secure and intact,MM pink/dry, pupils 3/reactive,left eye lid droop,No LAD Neuro:  Awake and alert, eyes open, following commands appropriately, MAE- weaker on left CV:  S1, S2, Irr/Irr, No RMG. A fib per tele PULM:  Bilateral chest excursion, few rhonchi, diminished per bases, rales per bases GI: soft, non-tender, ND, bs active, Obese  Extremities: warm and dry, no obvious deformities, no further mottling noted Skin: no rashes , few scrapes and bruises  Resolved Hospital Problem list    Assessment & Plan:   Hypoxic respiratory failure in the setting of aspiration pneumonia/ R sided  CT Chest >> Negative for PE, Multifocal pneumonia, debris noted in trachea, small to moderate bilateral pleural effusions. P:  Wean FiO2/ PEEP for sat goal > 92% Trend CXR VAP  PAD protocol as below with bowel regimen Meets criteria for Daily SBT/ WUA   Aspiration Pneumonia>> Multifocal per CT WBC down trending P:  Follow  trach asp>> GS with few GP cocci in pairs and chains, rare GN rods Follow Micro Continue  Unasyn  Trend fever curve/ CBC Trend CXR Consider Lasix  Trend PCT  ? PAH per CT Chest Neck Circumference most likely > 37 cm Obese Plan -Will need follow up as OP for assessment of OSA with sleep study  Subacute R MCA stroke - MRI unable to be performed due to metal object seen in Left eye on CT P:  Per Neurology Plans for EEG/ CTA head/ neck, holding on hypertonic saline for now Serial neuro exams, consider repeat CTH if neuro decompensation Per Neuro, will start ASA per Tube  SIRS/ sepsis +/- cardiogenic component given elevated troponin and BNP Hemodynamically stable No pressors needed Net + 1500 cc's P:  Goal MAP >65 Trend lactate Trend troponin and EKG Assess echo  Trend BNP  At risk AKI>> slight increase in creatinine overnight  Elevated CK/ rhabdo Hypo mag Hypo Phos Hypo K P:  LR at 75 ml/hr Monitor UOP/ BMP Trend CK>> down trending Foley cath Replete electrolytes prn ( K phos and mag)  Afib not on AC given previous GIB P:  Tele monitoring Hold home lopressor till BP stabilized Dig level now, then restart digoxin Trend BMP Will start ASA per tube   ETOH abuse Currently on Precedex P:  PAD protocol with precedex/ prn fentanyl for RASS goal  0/-1 Daily thiamine/ folate/ MVI Monitor for withdrawals>> may need ativan once precedex is d/c'd  Hx PUD/ prior GIB P:  PPI BID  Trend CBC  DMT2 P:  CBG q 4 SSI sensitive   HLD P:  Daily Lipitor   Best practice:  Diet: NPO/ will initiate TF is he does not extubated today Pain/Anxiety/Delirium protocol (if indicated): precedex/ prn fentanyl VAP protocol (if indicated): yes DVT prophylaxis: heparin SQ/ SCDs GI prophylaxis: PPI  Glucose control: CBG q 4 Mobility: BR Code Status: Full  Family Communication: will update wife Disposition: ICU  Labs   CBC: Recent Labs  Lab 06/06/18 2051  06/06/18 2058 06/07/18 0628 06/07/18 1108 06/08/18 0309  WBC 15.9*  --  19.2*  --  11.3*  NEUTROABS 13.1*  --   --   --   --   HGB 14.2 14.3 15.4 14.6 12.8*  HCT 43.6 42.0 45.6 43.0 38.3*  MCV 93.2  --  91.2  --  90.3  PLT 240  --  247  --  742    Basic Metabolic Panel: Recent Labs  Lab 06/06/18 2051 06/06/18 2058 06/07/18 0628 06/07/18 1108 06/07/18 1511 06/08/18 0309  NA 140 140 143 139  --  139  K 4.0 3.3* 3.5 3.5  --  3.4*  CL 99  --  104  --   --  105  CO2 26  --  28  --   --  22  GLUCOSE 125*  --  130*  --   --  137*  BUN 16  --  14  --   --  12  CREATININE 1.16  --  0.96  --   --  1.00  CALCIUM 9.5  --  9.1  --   --  8.2*  MG  --   --   --   --  1.3* 1.3*  PHOS  --   --   --   --   --  2.1*   GFR: Estimated Creatinine Clearance: 83.1 mL/min (by C-G formula based on SCr of 1 mg/dL). Recent Labs  Lab 06/06/18 2051 06/06/18 2052  06/06/18 2212 06/07/18 0628 06/07/18 1055 06/07/18 1238 06/08/18 0309  WBC 15.9*  --   --  19.2*  --   --  11.3*  LATICACIDVEN  --  3.9* 2.5*  --  3.0* 3.2*  --     Liver Function Tests: Recent Labs  Lab 06/06/18 2051 06/08/18 0309  AST 60*  --   ALT 22  --   ALKPHOS 67  --   BILITOT 1.9*  --   PROT 6.7  --   ALBUMIN 4.1 2.9*   Recent Labs  Lab 06/06/18 2051  LIPASE 22   No results for input(s): AMMONIA in the last 168 hours.  ABG    Component Value Date/Time   PHART 7.394 06/07/2018 1108   PCO2ART 37.8 06/07/2018 1108   PO2ART 170.0 (H) 06/07/2018 1108   HCO3 23.1 06/07/2018 1108   TCO2 24 06/07/2018 1108   ACIDBASEDEF 2.0 06/07/2018 1108   O2SAT 100.0 06/07/2018 1108     Coagulation Profile: Recent Labs  Lab 06/06/18 2051  INR 1.1    Cardiac Enzymes: Recent Labs  Lab 06/06/18 2306 06/07/18 0628 06/07/18 1055 06/07/18 1511 06/07/18 2114 06/08/18 0309  CKTOTAL 2,395*  --   --   --   --  768*  TROPONINI  --  0.74* 0.54* 0.61* 1.43*  --     HbA1C: Hgb A1c MFr Bld  Date/Time Value Ref Range Status  06/07/2018 06:28 AM 5.5 4.8 - 5.6 % Final    Comment:    (NOTE) Pre diabetes:          5.7%-6.4% Diabetes:              >6.4% Glycemic control for   <7.0% adults with diabetes   02/09/2018 10:10 AM 5.6 4.6 - 6.5 % Final    Comment:    Glycemic Control Guidelines for People with Diabetes:Non Diabetic:  <6%Goal of Therapy: <7%Additional Action Suggested:  >8%     CBG: Recent Labs  Lab 06/07/18 1603 06/07/18 1936  06/07/18 2330 06/08/18 0336 06/08/18 0801  GLUCAP 127* 126* 136* 127* 143*    Review of Systems:   Unable  Past Medical History  He,  has a past medical history of Alcohol abuse, Arthritis, Atrial fibrillation ([place]), Gastric ulcer, Gout, History of digital clubbing, Hyperkalemia (04/2017), Hyperlipidemia (07/2016), Hypertension, Nonischemic cardiomyopathy ([place]), Nonrheumatic mitral valve regurgitation, Osteoarthritis of right  hip, Tobacco abuse, and Type II diabetes mellitus ([place]) (07/2016).   Surgical History    Past Surgical History:  Procedure Laterality Date  . CARDIOVASCULAR STRESS TEST    . CARDIOVERSION     DC  . TONSILLECTOMY       Social History   reports that he quit smoking about 5 years ago. His smoking use included cigarettes. He has a 5.00 pack-year smoking history. He has never used smokeless tobacco. He reports current alcohol use. He reports that he does not use drugs.   Family History   His family history includes Alcohol abuse in his father; Congestive Heart Failure in his mother; Diabetes in his mother; Heart disease in his paternal grandfather; Hypertension in his father; Stroke in his father.   Allergies No Known Allergies   Home Medications  Prior to Admission medications   Medication Sig Start Date End Date Taking? Authorizing Provider  allopurinol (ZYLOPRIM) 100 MG tablet 2 tabs po qd 03/14/18   McGowen, Adrian Blackwater, MD  digoxin (LANOXIN) 0.125 MG tablet Take 1 tablet by mouth daily. 07/25/16   [provider]  glucose blood (CONTOUR NEXT TEST) test strip Use to check blood sugar once daily. 10/07/16   McGowen, Adrian Blackwater, MD  lisinopril (PRINIVIL,ZESTRIL) 40 MG tablet Take 1 tablet by mouth daily. 07/25/16   [provider]  metFORMIN (GLUCOPHAGE) 500 MG tablet TAKE 1 TABLET AT BEDTIME 08/14/17   McGowen, Adrian Blackwater, MD  metoprolol succinate (TOPROL-XL) 100 MG 24 hr tablet Take 1 tablet by mouth 2 (two) times daily. 07/25/16   [provider]  MICROLET LANCETS MISC Use to check blood sugar once daily 11/15/16   McGowen, Adrian Blackwater, MD  pantoprazole (PROTONIX) 40 MG tablet Take 40 mg by mouth 2 (two) times daily. 08/01/16   [provider]  predniSONE (DELTASONE) 20 MG tablet 2 tabs po qd x 5d, then 1 tab po qd x 5d 03/14/18   McGowen, Adrian Blackwater, MD     Critical care time:      Magdalen Spatz,  MSN, AGACNP-BC [facility] Pgr:  [phone]  or if no answer [phone] 06/08/2018, 8:50 AM

## 2018-06-08 NOTE — Plan of Care (Signed)
TF initiated today, pt tolerating well

## 2018-06-08 NOTE — Progress Notes (Signed)
Initial Nutrition Assessment  INTERVENTION:   Monitor magnesium, potassium, and phosphorus daily for at least 3 days, MD to replete as needed, as pt is at risk for refeeding syndrome given ETOH abuse and low Mg/Phos/K levels.  Initiate Vital AF 1.2 @ 20 ml/hr, advance by 10 ml every 4 hours to goal rate of 75 ml/hr. This provides 2160 kcal, 135g protein and 1459 ml H2O.  NUTRITION DIAGNOSIS:   Inadequate oral intake related to dysphagia(s/p MCA stroke) as evidenced by NPO status.  GOAL:   Patient will meet greater than or equal to 90% of their needs  MONITOR:   Vent status, Labs, Weight trends, TF tolerance  REASON FOR ASSESSMENT:   Consult, Ventilator Enteral/tube feeding initiation and management  ASSESSMENT:   51 yoM with Afib not on AC and ETOH abuse presented 5/27 with AMS found to have large subacute R MCA stroke.  5/28: Patient was intubated d/t hypoxia, respiratory distress and aspiration  Patient is currently intubated on ventilator support MV: 12 L/min Temp (24hrs), Avg:99.6 F (37.6 C), Min:99 F (37.2 C), Max:100.6 F (38.1 C)  Patient with history of heavy ETOH abuse. Per MD notes, pt reports drinking 1 pint of beer on the weekends. Per H&P, pt was drinking liquor daily.   Per weight records, pt's weight has increased since March 2020 (+8 lbs).   Medications: Folic acid tablet daily, Multivitamin with minerals daily, IV Thiamine daily, Precedex infusion, Lactated Ringers infusion, IV Mg sulfate, K-Phos infusion Labs reviewed: CBGs: 127-143 Low K, Phos, Mg  NUTRITION - FOCUSED PHYSICAL EXAM:  Unable to perform per department requirements to work remotely.  Diet Order:   Diet Order            Diet NPO time specified  Diet effective now              EDUCATION NEEDS:   Not appropriate for education at this time  Skin:  Skin Assessment: Reviewed RN Assessment  Last BM:  PTA  Height:   Ht Readings from Last 1 Encounters:  06/07/18 5\' 10"   (1.778 m)    Weight:   Wt Readings from Last 1 Encounters:  06/06/18 84.8 kg    Ideal Body Weight:  75.5 kg  BMI:  Body mass index is 26.83 kg/m.  Estimated Nutritional Needs:   Kcal:  2133  Protein:  120-130g  Fluid:  2.1L/day   Robert Bibles, MS, RD, LDN [facility] Pager: [phone] After Hours Pager: [phone]

## 2018-06-08 NOTE — Progress Notes (Signed)
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 VIEW COMPARISON:  06/07/2018 FINDINGS: Orogastric tube with the tip projecting over the stomach. There is no bowel dilatation to suggest obstruction. There is no evidence of pneumoperitoneum, portal venous gas or pneumatosis. There are no pathologic calcifications along the expected course of the ureters. Severe osteoarthritis of the right hip. IMPRESSION: Orogastric tube with the tip projecting over the stomach. Electronically Signed   By: Kathreen Devoid   On: 06/07/2018 12:56   Dg Abd Portable 1v  Result Date: 06/07/2018 CLINICAL DATA:  Orogastric tube placement EXAM: PORTABLE ABDOMEN - 1 VIEW COMPARISON:  Chest radiograph Jun 07, 2018 FINDINGS: Orogastric tube loops upon itself in the distal esophagus. There is no bowel dilatation or air-fluid level to suggest bowel obstruction. No free air. There is advanced arthropathy in the right hip joint region. IMPRESSION: Orogastric tube loops upon itself in the distal esophagus with tip directed superiorly. No bowel obstruction or free air. Advanced arthropathy right hip joint region. Electronically Signed   By: Lowella Grip III M.D.   On: 06/07/2018 10:20   Dg Hip Unilat W Or Wo Pelvis 2-3 Views Right  Result Date:  06/06/2018 CLINICAL DATA:  Dyspnea, right leg foreshortening EXAM: DG HIP (WITH OR WITHOUT PELVIS) 2-3V RIGHT COMPARISON:  None. FINDINGS: No acute fracture or dislocation. Severe advanced osteoarthritis of the right hip with joint space narrowing with a bone-on-bone appearance, marginal osteophytes and subchondral cystic changes. Significant chronic remodeling of the acetabula. Underlying developmental dysplasia of the hip. No aggressive osseous lesion. IMPRESSION: 1. No acute osseous injury of the right hip. 2. Severe osteoarthritis of the right hip. Electronically Signed   By: Kathreen Devoid   On: 06/06/2018 21:26   [place] Ekg Site Rite  Result Date: 06/07/2018 If Site Rite image not attached, placement could not be confirmed due to current cardiac rhythm.    Transthoracic Echocardiogram  Reduced ejection fraction of 35 to 40%.  No wall motion abnormalities.  EKG - Afib RVR  PHYSICAL EXAM Blood pressure 97/75, pulse 85, temperature 99.1 F (37.3 C), temperature source Axillary, resp. rate 18, height _0  (1.778 m), weight 84.8 kg, SpO2 99 %. Middle-aged Caucasian male who is sedated and intubated. Neurological Exam :  Patient is sedated and intubated.but he awakens easily.  Follows both midline and extremity commands and able to raise all 4 extremities against gravity purposefully.     Right gaze deviation.  Pupils equal reactive.  Does not blink to threat on the left but does so on the right.  Left lower facial weakness.  Tongue midline.  Moves all 4 extremities symmetrically against gravity.  Deep tendon reflexes are symmetric.  Plantars downgoing.  HOME MEDICATIONS:  Medications Prior to Admission  Medication Sig Dispense Refill  . digoxin (LANOXIN) 0.125 MG tablet Take 0.125 mg by mouth daily.     Marland Kitchen lisinopril (PRINIVIL,ZESTRIL) 40 MG tablet Take 40 mg by mouth daily.     . metoprolol succinate (TOPROL-XL) 100 MG 24 hr tablet Take 100 mg by mouth 2 (two) times daily.     . pantoprazole  (PROTONIX) 40 MG tablet  Take 40 mg by mouth 2 (two) times daily.  0  . allopurinol (ZYLOPRIM) 100 MG tablet 2 tabs po qd (Patient taking differently: Take 200 mg by mouth daily. ) 60 tablet 2  . glucose blood (CONTOUR NEXT TEST) test strip Use to check blood sugar once daily. 100 each 12  . metFORMIN (GLUCOPHAGE) 500 MG tablet TAKE 1 TABLET AT BEDTIME (Patient not taking: No sig reported) 90 tablet 1  . MICROLET LANCETS MISC Use to check blood sugar once daily 100 each 11  . predniSONE (DELTASONE) 20 MG tablet 2 tabs po qd x 5d, then 1 tab po qd x 5d (Patient not taking: Reported on 06/08/2018) 15 tablet 0      HOSPITAL MEDICATIONS:  . aspirin  325 mg Per Tube Daily   Or  . aspirin  300 mg Rectal Daily  . atorvastatin  40 mg Per Tube q1800  . chlorhexidine gluconate (MEDLINE KIT)  15 mL Mouth Rinse BID  . Chlorhexidine Gluconate Cloth  6 each Topical Daily  . digoxin  0.125 mg Per Tube Daily  . folic acid  1 mg Per Tube Daily  . heparin  5,000 Units Subcutaneous Q8H  . insulin aspart  0-9 Units Subcutaneous Q4H  . mouth rinse  15 mL Mouth Rinse 10 times per day  . multivitamin with minerals  1 tablet Per Tube Daily  . pantoprazole sodium  40 mg Per Tube BID  . thiamine  100 mg Intravenous Daily    ALLERGIES No Known Allergies ASSESSMENT/PLAN Mr. Sherrill Mckamie is a 58 y.o. male with history of Etoh Abuse, Afib, not on [place] d/t GIB. He presented with large subacute RMCA infarct.  Stroke: R MCA  Resultant  Dense left hemiparesis, aphasia, dysphagia  CT head R MCA stroke  MRI head - pending  CTA H&N -no large vessel intracranial occlusion.  70% stenosis proximal right ICA.  Occlusion of the right vertebral artery at its origin.    2D Echo - pending  [place] Corona Virus 2 neg  LDL - 103  HgbA1c - 5.5  UDS - neg  VTE prophylaxis - lovenox  Diet NPO- nasoenteric tube needed for now till stable  None prior to admission, now on ASA 340m  Unable to Patient counseled to be  compliant with his antithrombotic medications  Ongoing aggressive stroke risk factor management  Therapy recommendations:  pending  Disposition:  Pending  Hypertension  Unstable; currently hypotensive in setting of septic shock . Pressors as needed with IVF while critically ill/septic . Long-term BP goal normotensive  Hyperlipidemia  Lipid lowering medication PTA:  none  LDL 103, goal < 70  Current lipid lowering medication:Lipitor 460m Continue statin at discharge  No known dx of Diabetes  HgbA1c 5.5 , goal < 7.0  Other Stroke Risk Factors  Cigarette smoker; advised to stop smoking when able  ETOH use; advise when able to quit  Evidence of prior stroke seen on CT   Other Active Problems  Aspiration PNA  Acute hypoxic respiratory failure  Heavy ETOH abuse- monitor for w/d seizures. EEG ordered  Hospital day # 2  .  The patient presented with large right MCA subacute infarct beyond time window for TPA or thrombectomy.  He overnight developed respiratory distress due to aspiration pneumonia and has been intubated.  Continue respiratory support and antibiotics and hemodynamic support as per pulmonary critical care team.     Check echocardiogram ,  and continue cardiac monitoring.  Start aspirin for  stroke prevention.  Statin for elevated LDL.  Discussed with Dr. Luan Pulling.  No family available for discussion. This patient is critically ill and at significant risk of neurological worsening, death and care requires constant monitoring of vital signs, hemodynamics,respiratory and cardiac monitoring, extensive review of multiple databases, frequent neurological assessment, discussion with family, other specialists and medical decision making of high complexity.I have made any additions or clarifications directly to the above note.This critical care time does not reflect procedure time, or teaching time or supervisory time of PA/NP/Med Resident etc but could involve care  discussion time.  I spent 30 minutes of neurocritical care time  in the care of  this patient.     Antony Contras, MD Medical Director [place] Pager: [phone] 06/08/2018 5:00 PM  To contact Stroke Continuity provider, please refer to [email or web address] After hours, contact General Neurology

## 2018-06-08 NOTE — Evaluation (Signed)
SLP Cancellation Note  Patient Details Name: Omarian Jaquith MRN: [id number] DOB: 11-02-1960   Cancelled treatment:       Reason Eval/Treat Not Completed: Medical issues which prohibited therapy(pt intubated )   Macario Golds 06/08/2018, 7:17 AM   Luanna Salk, [facility] SLP [place] Pager [phone] Office [phone]

## 2018-06-08 NOTE — Progress Notes (Signed)
PT Cancellation Note  Patient Details Name: Robert [name] MRN: [id number] DOB: February 09, 1960   Cancelled Treatment:    Reason Eval/Treat Not Completed: Patient not medically ready Signing off per RN. Please reconsult when appropriate. thanks   Lacie Draft 06/08/2018, 2:58 PM Wray Kearns, PT, DPT Acute Rehabilitation Services Pager [phone] Office [phone]

## 2018-06-09 ENCOUNTER — Inpatient Hospital Stay (HOSPITAL_COMMUNITY): Payer: BLUE CROSS/BLUE SHIELD

## 2018-06-09 DIAGNOSIS — Z978 Presence of other specified devices: Secondary | ICD-10-CM

## 2018-06-09 DIAGNOSIS — I63411 Cerebral infarction due to embolism of right middle cerebral artery: Secondary | ICD-10-CM

## 2018-06-09 LAB — BASIC METABOLIC PANEL
Anion gap: 15 (ref 5–15)
BUN: 12 mg/dL (ref 6–20)
CO2: 27 mmol/L (ref 22–32)
Calcium: 8.2 mg/dL — ABNORMAL LOW (ref 8.9–10.3)
Chloride: 99 mmol/L (ref 98–111)
Creatinine, Ser: 0.82 mg/dL (ref 0.61–1.24)
GFR calc Af Amer: >60 mL/min (ref >60)
GFR calc non Af Amer: >60 mL/min (ref >60)
Glucose, Bld: 157 mg/dL — ABNORMAL HIGH (ref 70–99)
Potassium: 2.9 mmol/L — ABNORMAL LOW (ref 3.5–5.1)
Sodium: 141 mmol/L (ref 135–145)

## 2018-06-09 LAB — CULTURE, RESPIRATORY W GRAM STAIN
Culture: NORMAL
Special Requests: NORMAL

## 2018-06-09 LAB — GLUCOSE, CAPILLARY
Glucose-Capillary: 116 mg/dL — ABNORMAL HIGH (ref 70–99)
Glucose-Capillary: 155 mg/dL — ABNORMAL HIGH (ref 70–99)
Glucose-Capillary: 97 mg/dL (ref 70–99)
Glucose-Capillary: 109 mg/dL — ABNORMAL HIGH (ref 70–99)
Glucose-Capillary: 88 mg/dL (ref 70–99)

## 2018-06-09 LAB — PHOSPHORUS
Phosphorus: 2.1 mg/dL — ABNORMAL LOW (ref 2.5–4.6)
Phosphorus: 4.2 mg/dL (ref 2.5–4.6)

## 2018-06-09 LAB — DIGOXIN LEVEL: Digoxin Level: <0.2 ng/mL — ABNORMAL LOW (ref 0.8–2.0)

## 2018-06-09 LAB — TROPONIN I
Troponin I: 2.5 ng/mL (ref <0.03)
Troponin I: 2.07 ng/mL (ref <0.03)
Troponin I: 2.02 ng/mL (ref <0.03)
Troponin I: 1.77 ng/mL (ref <0.03)

## 2018-06-09 LAB — CK TOTAL AND CKMB (NOT AT ARMC)
CK, MB: 3 ng/mL (ref 0.5–5.0)
Relative Index: 0.7 (ref 0.0–2.5)
Total CK: 439 U/L — ABNORMAL HIGH (ref 49–397)

## 2018-06-09 LAB — CBC
HCT: 39.3 % (ref 39.0–52.0)
Hemoglobin: 13 g/dL (ref 13.0–17.0)
MCH: 30.7 pg (ref 26.0–34.0)
MCHC: 33.1 g/dL (ref 30.0–36.0)
MCV: 92.9 fL (ref 80.0–100.0)
Platelets: 175 10*3/uL (ref 150–400)
RBC: 4.23 MIL/uL (ref 4.22–5.81)
RDW: 13.7 % (ref 11.5–15.5)
WBC: 10 10*3/uL (ref 4.0–10.5)
nRBC: 0 % (ref 0.0–0.2)

## 2018-06-09 LAB — PROCALCITONIN: Procalcitonin: 0.25 ng/mL

## 2018-06-09 LAB — BRAIN NATRIURETIC PEPTIDE: B Natriuretic Peptide: 633.9 pg/mL — ABNORMAL HIGH (ref 0.0–100.0)

## 2018-06-09 LAB — LACTIC ACID, PLASMA: Lactic Acid, Venous: 1.7 mmol/L (ref 0.5–1.9)

## 2018-06-09 LAB — MAGNESIUM: Magnesium: 1.8 mg/dL (ref 1.7–2.4)

## 2018-06-09 MED ORDER — MAGNESIUM SULFATE 2 GM/50ML IV SOLN
2.0000 g | Freq: Once | INTRAVENOUS | Status: AC
  Administered 2018-06-10: 07:00:00 2 g via INTRAVENOUS
  Filled 2018-06-09: qty 50

## 2018-06-09 MED ORDER — METOPROLOL TARTRATE 5 MG/5ML IV SOLN
5.0000 mg | Freq: Four times a day (QID) | INTRAVENOUS | Status: DC
  Administered 2018-06-09 – 2018-06-12 (×11): 5 mg via INTRAVENOUS
  Filled 2018-06-09 (×11): qty 5

## 2018-06-09 MED ORDER — FUROSEMIDE 10 MG/ML IJ SOLN
40.0000 mg | Freq: Once | INTRAMUSCULAR | Status: AC
  Administered 2018-06-09: 14:00:00 40 mg via INTRAVENOUS
  Filled 2018-06-09: qty 4

## 2018-06-09 MED ORDER — LORAZEPAM 2 MG/ML IJ SOLN
1.0000 mg | Freq: Once | INTRAMUSCULAR | Status: AC
  Administered 2018-06-09: 1 mg via INTRAVENOUS
  Filled 2018-06-09: qty 1

## 2018-06-09 MED ORDER — POTASSIUM CHLORIDE 20 MEQ/15ML (10%) PO SOLN
40.0000 meq | ORAL | Status: AC
  Administered 2018-06-09: 40 meq
  Filled 2018-06-09: qty 30

## 2018-06-09 MED ORDER — METOPROLOL TARTRATE 25 MG PO TABS: 25.0000 mg | ORAL_TABLET | Freq: Three times a day (TID) | ORAL | Status: DC

## 2018-06-09 MED ORDER — POTASSIUM PHOSPHATES 15 MMOLE/5ML IV SOLN
30.0000 mmol | Freq: Once | INTRAVENOUS | Status: AC
  Administered 2018-06-09: 30 mmol via INTRAVENOUS
  Filled 2018-06-09: qty 10

## 2018-06-09 MED ORDER — SODIUM CHLORIDE 3 % IN NEBU
4.0000 mL | INHALATION_SOLUTION | Freq: Four times a day (QID) | RESPIRATORY_TRACT | Status: AC
  Administered 2018-06-09 – 2018-06-12 (×12): 4 mL via RESPIRATORY_TRACT
  Filled 2018-06-09 (×13): qty 4

## 2018-06-09 MED ORDER — SODIUM CHLORIDE 3 % IN NEBU
4.0000 mL | INHALATION_SOLUTION | RESPIRATORY_TRACT | Status: DC
  Administered 2018-06-09 (×2): 4 mL via RESPIRATORY_TRACT
  Filled 2018-06-09 (×2): qty 4

## 2018-06-09 NOTE — Evaluation (Signed)
SLP Cancellation Note  Patient Details Name: Robert [name] MRN: [id number] DOB: 1960-11-20   Cancelled treatment:       Reason Eval/Treat Not Completed: Medical issues which prohibited therapy(pt remains intubated, will continue efforts)   Macario Golds 06/09/2018, 7:06 AM   Luanna Salk, MS [facility] SLP Acute Rehab Services Pager [phone] Office [phone]

## 2018-06-09 NOTE — Procedures (Signed)
Extubation Procedure Note  Patient Details:   Name: Robert [name] DOB: 03/26/1960 MRN: [id number]   Airway Documentation:    Vent end date: 06/09/18 Vent end time: 0920   Evaluation  O2 sats: stable throughout Complications: No apparent complications Patient did tolerate procedure well. Bilateral Breath Sounds: Rhonchi, Diminished   Yes   Positive cuff leak noted. Patient placed on [name] 3 L with humidity. No stridor noted.  IS at bedside.  [name] 06/09/2018, 10:09 AM

## 2018-06-09 NOTE — Progress Notes (Signed)
eLink Physician-Brief Progress Note Patient Name: Robert [name] DOB: [birth year] MRN: [id number]   Date of Service  06/09/2018  HPI/Events of Note  Hypokalemia, hypomag, hypophos  eICU Interventions  Potassium, Mag, Phos replaced     Intervention Category Intermediate Interventions: Electrolyte abnormality - evaluation and management  DETERDING,ELIZABETH 06/09/2018, 6:42 AM

## 2018-06-09 NOTE — Progress Notes (Signed)
RN notified MD of patient labs (hypokalemia, hypomagnesium, low phosphorus, elevated Troponin, and brief episode of heart rate up to 200 at 0315. Labs were done shortly after this episode. Patient asymptomatic and was given PRN metoprolol. See MAR. Orders received. RN will continue to monitor.

## 2018-06-09 NOTE — Progress Notes (Signed)
CPAP order contraindicated due to level of confusion. RN aware and in agreement.
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
## 2018-06-09 NOTE — Progress Notes (Signed)
STROKE TEAM PROGRESS NOTE      SUBJECTIVE (INTERVAL HISTORY) Patient was just extubated and tolerating well. Severe dysarthria. Moving all extremities except right hip arthritis. Following all simple commands. BP stable. Troponin trending down. CK MB normal. Still has secretions at throat.   OBJECTIVE Vitals:   06/09/18 0930 06/09/18 1000 06/09/18 1026 06/09/18 1100  BP: 125/84 (!) 116/91 (!) 116/91 127/85  Pulse: (!) 103 64 96 97  Resp: 15 19 18 18   Temp:      TempSrc:      SpO2: 100% 100% 95% 99%  Weight:      Height:        CBC:  Recent Labs  Lab 06/06/18 2051  06/08/18 0309 06/09/18 0331  WBC 15.9*   < > 11.3* 10.0  NEUTROABS 13.1*  --   --   --   HGB 14.2   < > 12.8* 13.0  HCT 43.6   < > 38.3* 39.3  MCV 93.2   < > 90.3 92.9  PLT 240   < > 175 175   < > = values in this interval not displayed.    Basic Metabolic Panel:  Recent Labs  Lab 06/08/18 0309 06/08/18 1006 06/08/18 1641 06/09/18 0331  NA 139 137  --  141  K 3.4* 4.3  --  2.9*  CL 105 106  --  99  CO2 22 17*  --  27  GLUCOSE 137* 104*  --  157*  BUN 12 36*  --  12  CREATININE 1.00 1.57*  --  0.82  CALCIUM 8.2* 8.4*  --  8.2*  MG 1.3*  --   --  1.8  PHOS 2.1* 4.9* 2.7 2.1*    Lipid Panel:     Component Value Date/Time   CHOL 198 06/07/2018 0628   TRIG 72 06/07/2018 0628   HDL 81 06/07/2018 0628   CHOLHDL 2.4 06/07/2018 0628   VLDL 14 06/07/2018 0628   LDLCALC 103 (H) 06/07/2018 0628   HgbA1c:  Lab Results  Component Value Date   HGBA1C 5.5 06/07/2018   Urine Drug Screen:     Component Value Date/Time   LABOPIA NONE DETECTED 06/06/2018 2013   COCAINSCRNUR NONE DETECTED 06/06/2018 2013   LABBENZ NONE DETECTED 06/06/2018 2013   AMPHETMU NONE DETECTED 06/06/2018 2013   THCU NONE DETECTED 06/06/2018 2013   LABBARB NONE DETECTED 06/06/2018 2013    Alcohol Level     Component Value Date/Time   ETH <10 06/06/2018 2052    IMAGING  Ct Angio Head W Or Wo Contrast Ct Angio Neck W  Or Wo Contrast 06/08/2018 IMPRESSION:   CT HEAD  1. Continued interval evolution of large right MCA territory infarct, stable in size and distribution from previous. Associated mild regional mass effect with trace 2 mm right-to-left shift. Probable associated petechial hemorrhage without evidence for hemorrhagic transformation.  2. No other new acute intracranial abnormality.  3. Underlying atrophy with moderate chronic small vessel ischemic disease, with additional multiple chronic infarcts as above.   CTA HEAD AND NECK  1. Negative CTA for acute large vessel occlusion.  2. 70% atheromatous stenosis at the proximal right ICA. Left carotid artery system widely patent within the neck.  3. Occlusion of the right vertebral artery at its origin. Dominant left vertebral artery widely patent within the neck. Moderate proximal left V4 stenosis.  4. Moderate to large layering bilateral pleural effusions with associated atelectasis and/or consolidation.    Ct Angio Chest Pe W  Or Wo Contrast 06/07/2018 IMPRESSION:  1. No PE identified on today's exam. The main pulmonary artery is dilated which can be seen in patients with elevated PA pressures.  2. Extensive bilateral multifocal consolidation as detailed above concerning for multifocal pneumonia or aspiration. Some debris is noted within the trachea.  3. The endotracheal tube terminates somewhat high at the level of the thoracic inlet. Repositioning should be considered.  4. Small to moderate-sized bilateral pleural effusions.  5. Enteric tube terminates in the stomach.  6. Cardiomegaly. There is reflux of contrast in the IVC consistent with some degree of underlying cardiac dysfunction. Aortic Atherosclerosis (ICD10-I70.0).   Dg Chest Port 1 View 06/09/2018 IMPRESSION:  1. Well-positioned support structures.  2. Decreased right perihilar opacity, suggesting improving pulmonary edema or improving atelectasis.  3. Stable small right pleural  effusion.  4. Similar patchy left retrocardiac opacity.   Dg Abd Portable 1v 06/07/2018 IMPRESSION:  Orogastric tube with the tip projecting over the stomach.    Transthoracic Echocardiogram  Reduced ejection fraction of 35 to 40%.  No wall motion abnormalities.  EKG - Afib RVR  EEG  IMPRESSION:  This recording shows moderate global slowing indicating a moderate global encephalopathy.  However, no epileptiform discharges are observed.   PHYSICAL EXAM  Temp:  [98.6 F (37 C)-99.1 F (37.3 C)] 98.6 F (37 C) (05/30 0800) Pulse Rate:  [31-110] 97 (05/30 1100) Resp:  [12-21] 18 (05/30 1100) BP: (92-152)/(67-108) 127/85 (05/30 1100) SpO2:  [92 %-100 %] 99 % (05/30 1100) FiO2 (%):  [40 %] 40 % (05/30 0755)  General - Well nourished, well developed, in no apparent distress, just extubated and tolerating well.  Ophthalmologic - fundi not visualized due to noncooperation.  Cardiovascular - irregularly irregular heart rate and rhythm.  Neuro - Patient is just extubated and tolerating well.  Follows all simple commands. Severe dysarthria but able to name and repeat simple sentence. Paucity of speech. No gaze deviation. PERRL, EOMI, Visual field full but left simutagnosia. left facial droop, tongue protrusion to the left, BUE 4-/5 and no drift. BLE distal 5/5, proximal left LE 4/5 and RLE 3/5 due to chronic right hip arthritis. Left lower facial weakness. Deep tendon reflexes are symmetric.  Plantars downgoing. Sensation symmetrical. FTN intact bilaterally. Gait not tested.    ASSESSMENT/PLAN [name] is a 58 y.o. male with history of Etoh Abuse, Afib not on Kelley d/t GIB, DM, peptic ulcer, hypertension, hyperlipidemia, gout who presented with AMS and dysarthria.  Found to have large subacute RMCA infarct.  Stroke: R LARGE MCA infarct due to A. fib not on AC vs. right ICA high-grade stenosis  Resultant severe dysarthria  CT head R LARGE MCA stroke  MRI head - not able to  performed due to metal in skull  CTA H&N - 70% stenosis proximal right ICA.  Occlusion of the right vertebral artery at its origin.   2D Echo - EF 35 to 40%.  No wall motion abnormalities.  EEG - moderate global slowing indicating a moderate global encephalopathy  [employer] Virus 2  - negative  LDL - 103  HgbA1c - 5.5  UDS - negative  VTE prophylaxis - lovenox  None prior to admission, now on ASA 325mg   Unable to Patient counseled to be compliant with his antithrombotic medications  Ongoing aggressive stroke risk factor management  Therapy recommendations:  pending  Disposition:  Pending  Chronic A. fib not on AC  Status post cardioversion 2015  Off Xarelto due  to GI bleeding and anemia in 2015  Patient also declined watchman device   On digoxin  Discussed with patient, he is willing to start Lenora when appropriate this time  Respiratory failure  Intubated for hypoxia on the floor  CTA chest no PE but bilateral pneumonia and pleural effusion  Extubated today, tolerating well  On Unasyn  Still has copious secretions  On 3% neb  NT suctioning  AKI  Creatinine 1.00-1.57-0.82  On IV fluid  Continue monitoring  Elevated troponin  Troponin I 0.43-2.50-2.07-2.02-1.77  Likely demand ischemia  Continue cycling troponin  Leukocytosis  WBC 15.9-19.2-11.3  Afebrile  Aspiration PNA - IV Unasyn started 06/07/18  Continue monitoring  NT suctioning  Hypertension  Hypotension much improved . Pressors as needed with IVF . Long-term BP goal normotensive  Hyperlipidemia  Lipid lowering medication PTA:  none  LDL 103, goal < 70  Current lipid lowering medication:Lipitor 40mg   Continue statin at discharge  Tobacco abuse  Current smoker  Smoking cessation counseling provided  Pt is willing to quit  Other Stroke Risk Factors  ETOH use; advise when able to quit - on FA/MVI/B1  Evidence of prior stroke seen on CT  Other Active  Problems  Hypokalemia - 2.9 - supplemented  Hospital day # 3  This patient is critically ill and at significant risk of neurological worsening, death and care requires constant monitoring of vital signs, hemodynamics,respiratory and cardiac monitoring, extensive review of multiple databases, frequent neurological assessment, discussion with family, other specialists and medical decision making of high complexity. I spent 35 minutes of neurocritical care time  in the care of  this patient.  Rosalin Hawking, MD PhD Stroke Neurology 06/09/2018 7:46 PM    To contact Stroke Continuity provider, please refer to [email or web address] After hours, contact General Neurology

## 2018-06-09 NOTE — Evaluation (Addendum)
Clinical/Bedside Swallow Evaluation Patient Details  Name: Robert [name] MRN: [id number] Date of Birth: 11-Jul-1960  Today's Date: 06/09/2018 Time: SLP Start Time (ACUTE ONLY): 1224 SLP Stop Time (ACUTE ONLY): 1252 SLP Time Calculation (min) (ACUTE ONLY): 28 min  Past Medical History:  Past Medical History:  Diagnosis Date  . Alcohol abuse    ongoing as of 07/2017  . Arthritis   . Atrial fibrillation ([place])    Not candidate for anticoagulation b/c of GI bleeding; had to get transfused.  Holding off on ASA until patient quits drinking.  . Gastric ulcer    hx of  . Gout    Usually MTP joint  . History of digital clubbing    "all my adult life"--CXR 08/2016 = bronchitic changes o/w normal.  . Hyperkalemia 04/2017   Normalized with decreasing lisinopril from 40 mg qd to 20 mg qd.  . Hyperlipidemia 07/2016   Holding off on statin until pt quits drinking.  Marland Kitchen Hypertension   . Nonischemic cardiomyopathy ([place])    resolved  . Nonrheumatic mitral valve regurgitation   . Osteoarthritis of right hip    Scheduled for surgery 05/11/15 but he then declined to get the surgery.  . Tobacco abuse    Quit 2014  . Type II diabetes mellitus ([place]) 07/2016   Dx'd by two fasting glucoses > 126 (Hb a1c 6.2% at that time).  Holding off on ASA until patient quits drinking.   Past Surgical History:  Past Surgical History:  Procedure Laterality Date  . CARDIOVASCULAR STRESS TEST    . CARDIOVERSION     DC  . TONSILLECTOMY     HPI:  pt is a 58 yo male adm to [facility] with large right MCA territory CVA, PMH + for ETOH use, HTN, DM2, Gi bleed, and pt developed aspiration pna and was intubated on 5/25-5/30/2020.  Swallow eval ordered after pt extubated 5/30 at 0930.  CXR showed right basilar ATX, improving edema.     Assessment / Plan / Recommendation Clinical Impression  Patient presents with indication of severe pharyngeal dysphagia - likely from intubation and likely exacerbated by right MCA CVA.  Pt with  facial, hypoglossal nerve and possible vagus nerve deficits. Could not view soft palate with phonation.  He is currently having difficulty managing secretions c/b consisently wet voice despite his coughing/"hocking" and using suction to clear.  He is nearly dysphonic at times and complains of throat pain.    SLP provided pt with 1/4 tsp of water only which resulted discoordinated swallow followed by appearance of panic in pt as his eyes opened widely with brief apnea.  No cough was elicited however suspect silent aspiration occured.  Pt declined to consume any further intake after occurrence and given concern for aspiration - SLP agreed.    Encouraged pt to continue to work on "hocking" to expectorate secretions retained in pharynx.  Reviewed goals of voicing, improved secretion management and decrease of possible pharyngeal/laryngeal edema before proceeding with instrumental swallow evaluation using teach back.    Pt wil benefit from MBS when appropriate due to multiple risk factors for silent dysphagia and h/o ETOH, decreased mobility increasing his recurrent asp pna risk.  Pt agreeable to plan and demonstrated recommendations and read NPO on sign provided.  Oral care QID.    Will follow up for dysphagia and cognitive linguistic skills.  Of note, pt reports 12 years of school and states he went to work after.  He demonstrates some elevation of pitch during phonation -  SLP inquired if he worked with SLP in school, to which he denied- did not reach out to family to establish baseline cognitive-linguistic function but will follow up regarding this next week.   SLP Visit Diagnosis: Dysphagia, oropharyngeal phase (R13.12)    Aspiration Risk  Severe aspiration risk;Risk for inadequate nutrition/hydration    Diet Recommendation NPO   Medication Administration: Via alternative means    Other  Recommendations Oral Care Recommendations: Oral care QID   Follow up Recommendations (tbd)      Frequency  and Duration min 1 x/week  1 week       Prognosis Prognosis for Safe Diet Advancement: Good      Swallow Study   General Date of Onset: 06/09/18 HPI: pt is a 58 yo male adm to [facility] with large right MCA territory CVA, PMH + for ETOH use, HTN, DM2, Gi bleed, and pt developed aspiration pna and was intubated on 5/25-5/30/2020.  Swallow eval ordered after pt extubated 5/30 at 0930.  CXR showed right basilar ATX, improving edema.   Type of Study: Bedside Swallow Evaluation Diet Prior to this Study: NPO Temperature Spikes Noted: No Respiratory Status: Nasal cannula History of Recent Intubation: Yes Length of Intubations (days): 3 days Date extubated: 06/09/18 Behavior/Cognition: Alert Oral Cavity Assessment: Within Functional Limits Oral Care Completed by SLP: No Oral Cavity - Dentition: Adequate natural dentition Self-Feeding Abilities: Needs assist Patient Positioning: Upright in bed Baseline Vocal Quality: Suspected CN X (Vagus) involvement;Other (comment);Wet Volitional Cough: Weak;Congested Volitional Swallow: Able to elicit(after oral care)    Oral/Motor/Sensory Function Overall Oral Motor/Sensory Function: Moderate impairment Facial ROM: Reduced left Facial Symmetry: Abnormal symmetry left Facial Strength: Reduced left Lingual ROM: Reduced left Lingual Symmetry: Within Functional Limits Lingual Strength: Reduced;Suspected CN XII (hypoglossal) dysfunction Velum: Other (comment)(difficult to view, ? minimal deviation to right upon phonation) Mandible: Other (Comment)(dnt)   Ice Chips Ice chips: Not tested Other Comments: pt declined to consume/try any ice after 1/4 tsp water given and he demonstrated overt difficulty with swallowing   Thin Liquid Thin Liquid: Impaired Presentation: Spoon(1/4 tsp) Pharyngeal  Phase Impairments: Other (comments) Other Comments: pt opened eyes widely;     Nectar Thick Nectar Thick Liquid: Not tested   Honey Thick Honey Thick Liquid: Not  tested   Puree Puree: Not tested   Solid     Solid: Not tested      Macario Golds 06/09/2018,1:41 PM Luanna Salk, [facility] SLP [place] Pager [phone] Office [phone]

## 2018-06-09 NOTE — Progress Notes (Addendum)
NAME:  Robert [name], MRN:  [id number], DOB:  Aug 21, 1960, LOS: 3 ADMISSION DATE:  06/06/2018, CONSULTATION DATE:  06/07/2018 REFERRING MD:  TRH, CHIEF COMPLAINT:  Hypoxia/ resp distress  Brief History   41 yoM with Afib not on AC and ETOH abuse presented 5/27 with AMS found to have large subacute R MCA stroke.  He was admitted to medical floor by hospitalist with stroke team following.  On 5/28 had acute decompensation with hypoxia with CXR consistent with aspiration.  He was tx to ICU, PCCM consulted, and required intubation on arrival.   History of present illness   HPI obtained from medical chart review given patient's respiratory distress.   58 year old male with history of Afib not on AC 2/2 GIB, DMT2, PUD, HTN, HLD, gout, and ETOH abuse who presented from home on 5/27 with altered mental status and some slurred speech.  Patient lives in different rooms than his wife and last seen in normal 5/26 but she found him confused and laying on the ground.    His workup showed a large sub acute right MCA stroke.  His initial CXR was negative.  Neurology was consulted and he was admitted to medical floor to hospitalist service.  He remained NPO after failing swallow study. He has had positive troponin and BNP.  EKG with afib/ non acute.   Follow-up CTH showing evolving subacute R MCA infarction on the morning of 5/28.  Later that morning, he had acute hypoxic decompensation and respiratory distress requiring NRB and transferred to ICU with PCCM consultation.  He required emergent intubation on arrival to ICU.   Past Medical History  Afib not on AC 2/2 GIB, DMT2, PUD, HTN, HLD, gout, ETOH abuse, former smoker  Significant Hospital Events   5/27 Admitted 5/28 decompensated/ hypoxic/ intubated   Consults:  Neurology   Procedures:  5/28 ETT >>  Significant Diagnostic Tests:  5/27 CTH and cervical >> CT of the head: Changes consistent with acute to subacute ischemia in the distribution of the  right middle cerebral artery. Encephalomalacia in the right posterior parietal lobe is noted consistent with prior ischemia. CT of cervical spine: Large anterior osteophytes without acute Abnormality.  5/28 CTH >> Evolving subacute infarction of the right MCA territory without significant change from the baseline CT, and no evidence hemorrhagic transformation. Redemonstration encephalomalacia of right parietal region.  CT Chest 5/29>> No PE identified on today's exam. The main pulmonary artery is dilated which can be seen in patients with elevated PA pressures. Extensive bilateral multifocal consolidation as detailed above concerning for multifocal pneumonia or aspiration. Some debris is noted within the trachea. The endotracheal tube terminates somewhat high at the level of the thoracic inlet. Repositioning should be considered. Small to moderate-sized bilateral pleural effusions. Enteric tube terminates in the stomach. Cardiomegaly. There is reflux of contrast in the IVC consistent with some degree of underlying cardiac dysfunction.  Micro Data:  5/27 SARS coronavirus 2>> negative 5/28 MRSA PCR >> 5/28 trach asp >>  Antimicrobials:  5/28 unasyn >>  Interim history/subjective:  SVT/Tachycardia overnight improved with metoprolol  Hypokalemia /hypomag, hypophos  Replaced this am  Troponin bump.  EKG with A fib , rate 101. Bp stable  Weaning well this am . Good volumes and O2 sats  Weaned well yesterday .   Following commands, calm on precedex    Objective   Blood pressure 125/88, pulse 94, temperature 98.6 F (37 C), temperature source Oral, resp. rate 17, height 5\' 10"  (1.778 m),  weight 84.8 kg, SpO2 100 %.    Vent Mode: PSV;CPAP FiO2 (%):  [40 %] 40 % Set Rate:  [20 bmp] 20 bmp Vt Set:  [580 mL] 580 mL PEEP:  [5 cmH20] 5 cmH20 Pressure Support:  [5 cmH20] 5 cmH20 Plateau Pressure:  [17 cmH20-19 cmH20] 19 cmH20   Intake/Output Summary (Last 24 hours) at  06/09/2018 0829 Last data filed at 06/09/2018 0800 Gross per 24 hour  Intake 3312.34 ml  Output 950 ml  Net 2362.34 ml   Filed Weights   06/06/18 2008  Weight: 84.8 kg   Examination: General: Adult male awake alert following commands in bed on vent  HEENT: ETT secure and intact, OG in place  Neuro: Awake alert, follows commands moves all extremity weaker on the left CV: Irregular irregular, no MRG, A. fib  PULM: Bilateral chest excursion, no wheezing.  Clear to auscultation  GI: Obese soft nontender positive bowel sounds, tube feeds  Extremities: Warm and dry no edema  Skin: No rashes warm , few scrapes and bruises  Resolved Hospital Problem list    Assessment & Plan:   Hypoxic respiratory failure in the setting of aspiration pneumonia/ R sided  CT Chest >> Negative for PE, Multifocal pneumonia, debris noted in trachea, small to moderate bilateral pleural effusions. 5/30 weaning well this a.m. good volumes no increased work of breathing and good O2 saturations for extubation P:  Wean FiO2/ PEEP for sat goal > 92% Trend CXR VAP  PAD protocol as below with bowel regimen Plan for extubation today  Chest x-ray in a.m. CPAP at bedtime for suspected underlying obstructive sleep apnea  Aspiration Pneumonia>> Multifocal per CT WBC down trending, procalcitonin trending down P:  Follow  trach asp>> GS with few GP cocci in pairs and chains, rare GN rods- Follow Micro Continue  Unasyn  Trend fever curve/ CBC Trend CXR   Neck Circumference most likely > 37 cm Obese Plan -Will need follow up as OP for assessment of OSA with sleep study  Subacute R MCA stroke - MRI unable to be performed due to metal object seen in Left eye on CT CT 5/29 large right MCA stable in size mild regional mass-effect with a 2 mm right to left shift, negative for acute large vessel occlusion, 70% stenosis at the proximal right ICA.  Left carotid artery patent.  Occlusion of the right vertebral artery.   Dominant left vertebral artery patent. P:  Per Neurology Serial neuro exams ASA per Tube per neuro  SIRS/ sepsis +/- cardiogenic component given elevated troponin and BNP  -LA tr down  A fib - Chronic hx , no AC due to PUD /Anemia in past  Elevated Troponin ? NSTEMI  CHF -Echo EF 35-40% 5/29 (improved from 2015 prev EF 30%)  P:  Goal MAP >65 Trend troponin  Trend BNP Cards consult  ASA  Cont Dig (dig level subtherapeutic)  Metoprolol on hold   At risk AKI>> scr improving  Elevated CK/ rhabdo-CK Tr down  Hypo mag Hypo Phos Hypo K P:  Mg/KPhos replaced  Decrease LR KVO  Monitor UOP/ BMP Trend CK  Foley cath Monitor and replace electrolytes as indicated.    ETOH abuse Currently on Precedex P:  PAD protocol with precedex/ prn fentanyl for RASS goal  0/-1 Daily thiamine/ folate/ MVI Monitor for withdrawals>> may need ativan once precedex is d/c'd After extubation , try to wean precedex   Hx PUD/ prior GIB P:  PPI BID  Trend CBC  DMT2 P:  CBG q 4 SSI sensitive   HLD P:  Daily Lipitor   Best practice:  Diet:  NPO , will need swallow eval after extubation .  Pain/Anxiety/Delirium protocol (if indicated): precedex/ prn fentanyl VAP protocol (if indicated): yes DVT prophylaxis: heparin SQ/ SCDs GI prophylaxis: PPI  Glucose control: CBG q 4 Mobility: BR Code Status: Full  Family Communication:  Updated wife 06/09/18 .  Disposition: ICU  Labs   CBC: Recent Labs  Lab 06/06/18 2051 06/06/18 2058 06/07/18 0628 06/07/18 1108 06/08/18 0309 06/09/18 0331  WBC 15.9*  --  19.2*  --  11.3* 10.0  NEUTROABS 13.1*  --   --   --   --   --   HGB 14.2 14.3 15.4 14.6 12.8* 13.0  HCT 43.6 42.0 45.6 43.0 38.3* 39.3  MCV 93.2  --  91.2  --  90.3 92.9  PLT 240  --  247  --  175 607    Basic Metabolic Panel: Recent Labs  Lab 06/06/18 2051  06/07/18 0628 06/07/18 1108 06/07/18 1511 06/08/18 0309 06/08/18 1006 06/08/18 1641 06/09/18 0331  NA 140   < > 143  139  --  139 137  --  141  K 4.0   < > 3.5 3.5  --  3.4* 4.3  --  2.9*  CL 99  --  104  --   --  105 106  --  99  CO2 26  --  28  --   --  22 17*  --  27  GLUCOSE 125*  --  130*  --   --  137* 104*  --  157*  BUN 16  --  14  --   --  12 36*  --  12  CREATININE 1.16  --  0.96  --   --  1.00 1.57*  --  0.82  CALCIUM 9.5  --  9.1  --   --  8.2* 8.4*  --  8.2*  MG  --   --   --   --  1.3* 1.3*  --   --  1.8  PHOS  --   --   --   --   --  2.1* 4.9* 2.7 2.1*   < > = values in this interval not displayed.   GFR: Estimated Creatinine Clearance: 101.4 mL/min (by C-G formula based on SCr of 0.82 mg/dL). Recent Labs  Lab 06/06/18 2051  06/06/18 2212 06/07/18 0628 06/07/18 1055 06/07/18 1238 06/08/18 0309 06/08/18 1006 06/09/18 0331  PROCALCITON  --   --   --   --   --   --   --  0.37 0.25  WBC 15.9*  --   --  19.2*  --   --  11.3*  --  10.0  LATICACIDVEN  --    < > 2.5*  --  3.0* 3.2*  --   --  1.7   < > = values in this interval not displayed.    Liver Function Tests: Recent Labs  Lab 06/06/18 2051 06/08/18 0309 06/08/18 1006  AST 60*  --  30  ALT 22  --  15  ALKPHOS 67  --  57  BILITOT 1.9*  --  0.6  PROT 6.7  --  5.6*  ALBUMIN 4.1 2.9* 2.9*   Recent Labs  Lab 06/06/18 2051  LIPASE 22   No results for input(s): AMMONIA in the last 168 hours.  ABG    Component  Value Date/Time   PHART 7.394 06/07/2018 1108   PCO2ART 37.8 06/07/2018 1108   PO2ART 170.0 (H) 06/07/2018 1108   HCO3 23.1 06/07/2018 1108   TCO2 24 06/07/2018 1108   ACIDBASEDEF 2.0 06/07/2018 1108   O2SAT 100.0 06/07/2018 1108     Coagulation Profile: Recent Labs  Lab 06/06/18 2051  INR 1.1    Cardiac Enzymes: Recent Labs  Lab 06/06/18 2306  06/07/18 1055 06/07/18 1511 06/07/18 2114 06/08/18 0309 06/09/18 0331 06/09/18 0656  CKTOTAL 2,395*  --   --   --   --  768*  --   --   TROPONINI  --    < > 0.54* 0.61* 1.43*  --  2.50* 2.07*   < > = values in this interval not displayed.     HbA1C: Hgb A1c MFr Bld  Date/Time Value Ref Range Status  06/07/2018 06:28 AM 5.5 4.8 - 5.6 % Final    Comment:    (NOTE) Pre diabetes:          5.7%-6.4% Diabetes:              >6.4% Glycemic control for   <7.0% adults with diabetes   02/09/2018 10:10 AM 5.6 4.6 - 6.5 % Final    Comment:    Glycemic Control Guidelines for People with Diabetes:Non Diabetic:  <6%Goal of Therapy: <7%Additional Action Suggested:  >8%     CBG: Recent Labs  Lab 06/08/18 1520 06/08/18 1937 06/08/18 2332 06/09/18 0319 06/09/18 0817  GLUCAP 121* 109* 128* 116* 155*    Review of Systems:   Unable  Past Medical History  He,  has a past medical history of Alcohol abuse, Arthritis, Atrial fibrillation ([place]), Gastric ulcer, Gout, History of digital clubbing, Hyperkalemia (04/2017), Hyperlipidemia (07/2016), Hypertension, Nonischemic cardiomyopathy ([place]), Nonrheumatic mitral valve regurgitation, Osteoarthritis of right hip, Tobacco abuse, and Type II diabetes mellitus ([place]) (07/2016).   Surgical History    Past Surgical History:  Procedure Laterality Date  . CARDIOVASCULAR STRESS TEST    . CARDIOVERSION     DC  . TONSILLECTOMY       Social History   reports that he quit smoking about 5 years ago. His smoking use included cigarettes. He has a 5.00 pack-year smoking history. He has never used smokeless tobacco. He reports current alcohol use. He reports that he does not use drugs.   Family History   His family history includes Alcohol abuse in his father; Congestive Heart Failure in his mother; Diabetes in his mother; Heart disease in his paternal grandfather; Hypertension in his father; Stroke in his father.   Allergies No Known Allergies   Home Medications  Prior to Admission medications   Medication Sig Start Date End Date Taking? Authorizing Provider  allopurinol (ZYLOPRIM) 100 MG tablet 2 tabs po qd 03/14/18   McGowen, Adrian Blackwater, MD  digoxin (LANOXIN) 0.125 MG tablet Take 1 tablet by mouth  daily. 07/25/16   [provider]  glucose blood (CONTOUR NEXT TEST) test strip Use to check blood sugar once daily. 10/07/16   McGowen, Adrian Blackwater, MD  lisinopril (PRINIVIL,ZESTRIL) 40 MG tablet Take 1 tablet by mouth daily. 07/25/16   [provider]  metFORMIN (GLUCOPHAGE) 500 MG tablet TAKE 1 TABLET AT BEDTIME 08/14/17   McGowen, Adrian Blackwater, MD  metoprolol succinate (TOPROL-XL) 100 MG 24 hr tablet Take 1 tablet by mouth 2 (two) times daily. 07/25/16   [provider]  MICROLET LANCETS MISC Use to check blood sugar once daily  11/15/16   McGowen, Adrian Blackwater, MD  pantoprazole (PROTONIX) 40 MG tablet Take 40 mg by mouth 2 (two) times daily. 08/01/16   [provider]  predniSONE (DELTASONE) 20 MG tablet 2 tabs po qd x 5d, then 1 tab po qd x 5d 03/14/18   McGowen, Adrian Blackwater, MD     Critical care time:      Tammy Parrett NP-C  [facility]  [phone]  06/09/2018   06/09/2018, 8:29 AM

## 2018-06-09 NOTE — Progress Notes (Signed)
Per Radiologist Dr Jeannine Boga, patient is not clear for MRI due to metallic BB adjacent to patient's left eye.

## 2018-06-09 NOTE — Progress Notes (Signed)
Assisted tele visit to patient with family member.  Robert Muegge Ann, RN  

## 2018-06-10 ENCOUNTER — Inpatient Hospital Stay (HOSPITAL_COMMUNITY): Payer: BLUE CROSS/BLUE SHIELD

## 2018-06-10 DIAGNOSIS — I4819 Other persistent atrial fibrillation: Secondary | ICD-10-CM

## 2018-06-10 LAB — CBC
HCT: 37.7 % — ABNORMAL LOW (ref 39.0–52.0)
Hemoglobin: 12.3 g/dL — ABNORMAL LOW (ref 13.0–17.0)
MCH: 30.6 pg (ref 26.0–34.0)
MCHC: 32.6 g/dL (ref 30.0–36.0)
MCV: 93.8 fL (ref 80.0–100.0)
Platelets: 200 10*3/uL (ref 150–400)
RBC: 4.02 MIL/uL — ABNORMAL LOW (ref 4.22–5.81)
RDW: 13.4 % (ref 11.5–15.5)
WBC: 10.1 10*3/uL (ref 4.0–10.5)
nRBC: 0 % (ref 0.0–0.2)

## 2018-06-10 LAB — BASIC METABOLIC PANEL
Anion gap: 13 (ref 5–15)
BUN: 9 mg/dL (ref 6–20)
CO2: 28 mmol/L (ref 22–32)
Calcium: 8.4 mg/dL — ABNORMAL LOW (ref 8.9–10.3)
Chloride: 102 mmol/L (ref 98–111)
Creatinine, Ser: 0.82 mg/dL (ref 0.61–1.24)
GFR calc Af Amer: >60 mL/min (ref >60)
GFR calc non Af Amer: >60 mL/min (ref >60)
Glucose, Bld: 137 mg/dL — ABNORMAL HIGH (ref 70–99)
Potassium: 3.2 mmol/L — ABNORMAL LOW (ref 3.5–5.1)
Sodium: 143 mmol/L (ref 135–145)

## 2018-06-10 LAB — GLUCOSE, CAPILLARY
Glucose-Capillary: 110 mg/dL — ABNORMAL HIGH (ref 70–99)
Glucose-Capillary: 123 mg/dL — ABNORMAL HIGH (ref 70–99)
Glucose-Capillary: 88 mg/dL (ref 70–99)
Glucose-Capillary: 94 mg/dL (ref 70–99)
Glucose-Capillary: 96 mg/dL (ref 70–99)
Glucose-Capillary: 96 mg/dL (ref 70–99)
Glucose-Capillary: 97 mg/dL (ref 70–99)

## 2018-06-10 LAB — MAGNESIUM: Magnesium: 1.7 mg/dL (ref 1.7–2.4)

## 2018-06-10 LAB — CK: Total CK: 414 U/L — ABNORMAL HIGH (ref 49–397)

## 2018-06-10 LAB — PHOSPHORUS: Phosphorus: 4.3 mg/dL (ref 2.5–4.6)

## 2018-06-10 LAB — DIGOXIN LEVEL: Digoxin Level: 0.2 ng/mL — ABNORMAL LOW (ref 0.8–2.0)

## 2018-06-10 MED ORDER — POTASSIUM CHLORIDE 10 MEQ/100ML IV SOLN
10.0000 meq | INTRAVENOUS | Status: AC
  Administered 2018-06-10 (×6): 10 meq via INTRAVENOUS
  Filled 2018-06-10 (×6): qty 100

## 2018-06-10 MED ORDER — CLONIDINE HCL 0.1 MG PO TABS: 0.1000 mg | ORAL_TABLET | Freq: Three times a day (TID) | ORAL | Status: DC | PRN

## 2018-06-10 MED ORDER — CHLORDIAZEPOXIDE HCL 5 MG PO CAPS
5.0000 mg | ORAL_CAPSULE | Freq: Three times a day (TID) | ORAL | Status: DC | PRN
  Filled 2018-06-10: qty 1

## 2018-06-10 MED ORDER — PANTOPRAZOLE SODIUM 40 MG IV SOLR
40.0000 mg | Freq: Every day | INTRAVENOUS | Status: DC
  Administered 2018-06-10 – 2018-06-11 (×2): 40 mg via INTRAVENOUS
  Filled 2018-06-10 (×2): qty 40

## 2018-06-10 MED ORDER — DIGOXIN 0.25 MG/ML IJ SOLN
0.1250 mg | Freq: Every day | INTRAMUSCULAR | Status: DC
  Administered 2018-06-10 – 2018-06-11 (×2): 0.125 mg via INTRAVENOUS
  Filled 2018-06-10 (×2): qty 2

## 2018-06-10 MED ORDER — DEXMEDETOMIDINE HCL IN NACL 400 MCG/100ML IV SOLN
0.4000 ug/kg/h | INTRAVENOUS | Status: DC
  Administered 2018-06-10: 19:00:00 0.8 ug/kg/h via INTRAVENOUS
  Administered 2018-06-10: 16:00:00 0.6 ug/kg/h via INTRAVENOUS
  Administered 2018-06-11: 1 ug/kg/h via INTRAVENOUS
  Administered 2018-06-11: 0.7 ug/kg/h via INTRAVENOUS
  Administered 2018-06-11: 1.1 ug/kg/h via INTRAVENOUS
  Administered 2018-06-11: 0.8 ug/kg/h via INTRAVENOUS
  Administered 2018-06-12: 0.9 ug/kg/h via INTRAVENOUS
  Filled 2018-06-10 (×6): qty 100

## 2018-06-10 MED ORDER — MAGNESIUM SULFATE 2 GM/50ML IV SOLN: 2.0000 g | Freq: Once | INTRAVENOUS | Status: AC

## 2018-06-10 MED ORDER — SODIUM CHLORIDE 0.9 % IV SOLN
INTRAVENOUS | Status: DC | PRN
  Administered 2018-06-11: 500 mL via INTRAVENOUS

## 2018-06-10 MED ORDER — LORAZEPAM 2 MG/ML IJ SOLN
2.0000 mg | Freq: Once | INTRAMUSCULAR | Status: AC
  Administered 2018-06-10: 16:00:00 2 mg via INTRAVENOUS
  Filled 2018-06-10: qty 1

## 2018-06-10 MED ORDER — POTASSIUM CHLORIDE 20 MEQ/15ML (10%) PO SOLN: 40.0000 meq | ORAL | Status: DC

## 2018-06-10 MED ORDER — DEXMEDETOMIDINE HCL IN NACL 200 MCG/50ML IV SOLN: 0.0000 ug/kg/h | INTRAVENOUS | Status: DC

## 2018-06-10 MED ORDER — CHLORHEXIDINE GLUCONATE CLOTH 2 % EX PADS
6.0000 | MEDICATED_PAD | Freq: Every day | CUTANEOUS | Status: DC
  Administered 2018-06-10 – 2018-06-15 (×4): 6 via TOPICAL

## 2018-06-10 NOTE — Progress Notes (Signed)
STROKE TEAM PROGRESS NOTE   SUBJECTIVE (INTERVAL HISTORY) Patient lying in bed, just finished video call with wife. He seems a little agitated that his wife [name] not at bedside and he [name] asking to go home. When I discuss with him about anticoagulation, he said he wants to go home.    OBJECTIVE Vitals:   06/10/18 0600 06/10/18 0700 06/10/18 0800 06/10/18 0900  BP: (!) 126/92 (!) 135/106 (!) 139/99 (!) 127/95  Pulse: 61 (!) 108 83 (!) 48  Resp: 17 17 18 17   Temp:   (!) 97.4 F (36.3 C)   TempSrc:   Axillary   SpO2: 100% 96% 99% 99%  Weight:      Height:        CBC:  Recent Labs  Lab 06/06/18 2051  06/09/18 0331 06/10/18 0421  WBC 15.9*   < > 10.0 10.1  NEUTROABS 13.1*  --   --   --   HGB 14.2   < > 13.0 12.3*  HCT 43.6   < > 39.3 37.7*  MCV 93.2   < > 92.9 93.8  PLT 240   < > 175 200   < > = values in this interval not displayed.    Basic Metabolic Panel:  Recent Labs  Lab 06/09/18 0331 06/09/18 1654 06/10/18 0421  NA 141  --  143  K 2.9*  --  3.2*  CL 99  --  102  CO2 27  --  28  GLUCOSE 157*  --  137*  BUN 12  --  9  CREATININE 0.82  --  0.82  CALCIUM 8.2*  --  8.4*  MG 1.8  --  1.7  PHOS 2.1* 4.2 4.3    Lipid Panel:     Component Value Date/Time   CHOL 198 06/07/2018 0628   TRIG 72 06/07/2018 0628   HDL 81 06/07/2018 0628   CHOLHDL 2.4 06/07/2018 0628   VLDL 14 06/07/2018 0628   LDLCALC 103 (H) 06/07/2018 0628   HgbA1c:  Lab Results  Component Value Date   HGBA1C 5.5 06/07/2018   Urine Drug Screen:     Component Value Date/Time   LABOPIA NONE DETECTED 06/06/2018 2013   COCAINSCRNUR NONE DETECTED 06/06/2018 2013   LABBENZ NONE DETECTED 06/06/2018 2013   AMPHETMU NONE DETECTED 06/06/2018 2013   THCU NONE DETECTED 06/06/2018 2013   LABBARB NONE DETECTED 06/06/2018 2013    Alcohol Level     Component Value Date/Time   ETH <10 06/06/2018 2052    IMAGING  Ct Angio Head W Or Wo Contrast Ct Angio Neck W Or Wo  Contrast 06/08/2018 IMPRESSION:   CT HEAD  1. Continued interval evolution of large right MCA territory infarct, stable in size and distribution from previous. Associated mild regional mass effect with trace 2 mm right-to-left shift. Probable associated petechial hemorrhage without evidence for hemorrhagic transformation.  2. No other new acute intracranial abnormality.  3. Underlying atrophy with moderate chronic small vessel ischemic disease, with additional multiple chronic infarcts as above.   CTA HEAD AND NECK  1. Negative CTA for acute large vessel occlusion.  2. 70% atheromatous stenosis at the proximal right ICA. Left carotid artery system widely patent within the neck.  3. Occlusion of the right vertebral artery at its origin. Dominant left vertebral artery widely patent within the neck. Moderate proximal left V4 stenosis.  4. Moderate to large layering bilateral pleural effusions with associated atelectasis and/or consolidation.    Ct Angio Chest Pe  W Or Wo Contrast 06/07/2018 IMPRESSION:  1. No PE identified on today's exam. The main pulmonary artery [name] dilated which can be seen in patients with elevated PA pressures.  2. Extensive bilateral multifocal consolidation as detailed above concerning for multifocal pneumonia or aspiration. Some debris [name] noted within the trachea.  3. The endotracheal tube terminates somewhat high at the level of the thoracic inlet. Repositioning should be considered.  4. Small to moderate-sized bilateral pleural effusions.  5. Enteric tube terminates in the stomach.  6. Cardiomegaly. There [name] reflux of contrast in the IVC consistent with some degree of underlying cardiac dysfunction. Aortic Atherosclerosis (ICD10-I70.0).   Dg Chest Port 1 View 06/09/2018 IMPRESSION:  1. Well-positioned support structures.  2. Decreased right perihilar opacity, suggesting improving pulmonary edema or improving atelectasis.  3. Stable small right pleural effusion.  4.  Similar patchy left retrocardiac opacity.   Dg Abd Portable 1v 06/07/2018 IMPRESSION:  Orogastric tube with the tip projecting over the stomach.    Transthoracic Echocardiogram  Reduced ejection fraction of 35 to 40%.  No wall motion abnormalities.  EKG - Afib RVR  EEG  IMPRESSION:  This recording shows moderate global slowing indicating a moderate global encephalopathy.  However, no epileptiform discharges are observed.   PHYSICAL EXAM  Temp:  [97.4 F (36.3 C)-98.7 F (37.1 C)] 97.4 F (36.3 C) (05/31 0800) Pulse Rate:  [48-159] 48 (05/31 0900) Resp:  [8-25] 17 (05/31 0900) BP: (109-168)/(26-116) 127/95 (05/31 0900) SpO2:  [91 %-100 %] 99 % (05/31 0900) Weight:  [84.4 kg] 84.4 kg (05/31 0500)  General - Well nourished, well developed, in no apparent distress  Ophthalmologic - fundi not visualized due to noncooperation.  Cardiovascular - irregularly irregular heart rate and rhythm.  Neuro - Patient [name] awake alert, frustrated not able to go home and wife not able to come over, limited exam due to noncooperation. He follows simple commands. Severe dysarthria but able to name and repeat simple sentence. Paucity of speech. No gaze deviation. PERRL, EOMI, Visual field full but left simutagnosia. left facial droop, tongue protrusion to the left, BUE 3+/5. BLE distal 5/5, proximal left LE 4/5 and RLE 3/5 due to chronic right hip arthritis. Left lower facial weakness. Deep tendon reflexes are symmetric.  Plantars downgoing. Sensation symmetrical. FTN intact bilaterally. Gait not tested.    ASSESSMENT/PLAN Robert [name] a 58 y.o. male with history of Etoh Abuse, Afib not on [place] d/t GIB, DM, peptic ulcer, hypertension, hyperlipidemia, gout who presented with AMS and dysarthria. Found to have large subacute RMCA infarct.  Stroke: R LARGE MCA infarct due to A. fib not on AC vs. right ICA high-grade stenosis  Resultant severe dysarthria  CT head R LARGE MCA stroke  MRI head  - not able to performed due to metal in skull  CTA H&N - 70% stenosis proximal right ICA.  Occlusion of the right vertebral artery at its origin.   2D Echo - EF 35 to 40%.  No wall motion abnormalities.  EEG - moderate global slowing indicating a moderate global encephalopathy  [employer] Virus 2  - negative  LDL - 103  HgbA1c - 5.5  UDS - negative  VTE prophylaxis - lovenox  None prior to admission, now on ASA 325mg   Unable to Patient counseled to be compliant with his antithrombotic medications  Ongoing aggressive stroke risk factor management  Therapy recommendations:  pending  Disposition:  Pending  Chronic A. fib not on AC  Status post cardioversion  2015  Off Xarelto due to GI bleeding and anemia in 2015  Patient also declined watchman device   On digoxin and IV metoprolol 5 mg Q6 hrs as well as prn.  Discussed with patient, he [name] willing to start Baxter when appropriate this time  Respiratory failure  Intubated for hypoxia on the floor  CTA chest no PE but bilateral pneumonia and pleural effusion -> CXR pending  Extubated today, tolerating well  On Unasyn started Thursday 5/28  Still has copious secretions  On 3% neb  NT suctioning  AKI  Creatinine 1.00-1.57-0.82->0.82  On IV fluid  Continue monitoring  Elevated troponin  Troponin I 0.43-2.50-2.07-2.02-1.77  Likely demand ischemia  Cardiology consult - Troponins "may represent demand in setting of hypotension, rhabdomyolysis, afib with  RVR"  Leukocytosis  WBC 15.9-19.2-11.3->10.1  Afebrile  Aspiration PNA - IV Unasyn started Thursday 06/07/18  Continue monitoring  NT suctioning  Hypertension  Hypotension much improved . Pressors as needed with IVF . Long-term BP goal normotensive  Hyperlipidemia  Lipid lowering medication PTA:  none  LDL 103, goal < 70  Current lipid lowering medication:Lipitor 40mg  once po access  Continue statin at discharge  Tobacco  abuse  Current smoker  Smoking cessation counseling provided  Pt [name] willing to quit  Other Stroke Risk Factors  ETOH use; advise when able to quit - on FA/MVI/B1  Evidence of prior stroke seen on CT  Other Active Problems  Hypokalemia - 2.9 ->3.2 -> supplemented - check in AM  Hospital day # 4  This patient [name] critically ill and at significant risk of neurological worsening, death and care requires constant monitoring of vital signs, hemodynamics,respiratory and cardiac monitoring, extensive review of multiple databases, frequent neurological assessment, discussion with family, other specialists and medical decision making of high complexity. I spent 35 minutes of neurocritical care time  in the care of  this patient.  Rosalin Hawking, MD PhD Stroke Neurology 06/10/2018 10:21 AM   To contact Stroke Continuity provider, please refer to [email or web address] After hours, contact General Neurology

## 2018-06-10 NOTE — Progress Notes (Addendum)
NAME:  Robert [name], MRN:  [id number], DOB:  1960-07-07, LOS: 4 ADMISSION DATE:  06/06/2018, CONSULTATION DATE:  06/07/2018 REFERRING MD:  TRH, CHIEF COMPLAINT:  Hypoxia/ resp distress  Brief History   10 yoM with Afib not on AC and ETOH abuse presented 5/27 with AMS found to have large subacute R MCA stroke.  He was admitted to medical floor by hospitalist with stroke team following.  On 5/28 had acute decompensation with hypoxia with CXR consistent with aspiration.  He was tx to ICU, PCCM consulted, and required intubation on arrival.   History of present illness   HPI obtained from medical chart review given patient's respiratory distress.   58 year old male with history of Afib not on AC 2/2 GIB, DMT2, PUD, HTN, HLD, gout, and ETOH abuse who presented from home on 5/27 with altered mental status and some slurred speech.  Patient lives in different rooms than his wife and last seen in normal 5/26 but she found him confused and laying on the ground.    His workup showed a large sub acute right MCA stroke.  His initial CXR was negative.  Neurology was consulted and he was admitted to medical floor to hospitalist service.  He remained NPO after failing swallow study. He has had positive troponin and BNP.  EKG with afib/ non acute.   Follow-up CTH showing evolving subacute R MCA infarction on the morning of 5/28.  Later that morning, he had acute hypoxic decompensation and respiratory distress requiring NRB and transferred to ICU with PCCM consultation.  He required emergent intubation on arrival to ICU.   Past Medical History  Afib not on AC 2/2 GIB, DMT2, PUD, HTN, HLD, gout, ETOH abuse, former smoker  Significant Hospital Events   5/27 Admitted 5/28 decompensated/ hypoxic/ intubated   Consults:  Neurology  Cardiology 5/30   Procedures:  5/28 ETT >>5/30  Significant Diagnostic Tests:  5/27 CTH and cervical >> CT of the head: Changes consistent with acute to subacute ischemia in the  distribution of the right middle cerebral artery. Encephalomalacia in the right posterior parietal lobe is noted consistent with prior ischemia. CT of cervical spine: Large anterior osteophytes without acute Abnormality.  5/28 CTH >> Evolving subacute infarction of the right MCA territory without significant change from the baseline CT, and no evidence hemorrhagic transformation. Redemonstration encephalomalacia of right parietal region.  CT Chest 5/29>> No PE identified on today's exam. The main pulmonary artery is dilated which can be seen in patients with elevated PA pressures. Extensive bilateral multifocal consolidation as detailed above concerning for multifocal pneumonia or aspiration. Some debris is noted within the trachea. The endotracheal tube terminates somewhat high at the level of the thoracic inlet. Repositioning should be considered. Small to moderate-sized bilateral pleural effusions. Enteric tube terminates in the stomach. Cardiomegaly. There is reflux of contrast in the IVC consistent with some degree of underlying cardiac dysfunction.  Micro Data:  5/27 SARS coronavirus 2>> negative 5/28 MRSA PCR >>NEG  5/28 trach asp >>normal resp flora   Antimicrobials:  5/28 unasyn >>  Interim history/subjective:  Extubated 5/30 , did well , O2 sats adequate on [place] 2l/m  Has copious oral secretions  I/O Bal x 24   - 1.6L ,  Remains afebrile , Cx are neg .  Agitation overnight , precedex restarted   Speech eval 5/30 Severe aspiration risk , Rec NPO  Did not start CPAP At bedtime  Due to confusion/oral secretions     Objective  Blood pressure (!) 139/99, pulse 83, temperature 98.5 F (36.9 C), temperature source Axillary, resp. rate 18, height 5\' 10"  (1.778 m), weight 84.4 kg, SpO2 99 %.        Intake/Output Summary (Last 24 hours) at 06/10/2018 0827 Last data filed at 06/10/2018 0800 Gross per 24 hour  Intake 1274.45 ml  Output 3200 ml  Net -1925.55 ml    Filed Weights   06/06/18 2008 06/10/18 0500  Weight: 84.8 kg 84.4 kg   Examination: General: Adult male awake alert , intermittent agitation follows some commands.  HEENT: copious oral secretions  Neuro: awake , alert, f/c intermittently, gen weakness , agitations CV: Irregular irregular, no MRG, A. fib  PULM: bilateral rhonchi , very weak cough  GI: Obese soft nontender positive bowel sounds Extremities: Warm and dry no edema  Skin: No rashes warm , few scrapes and bruises  Resolved Hospital Problem list    Assessment & Plan:   Hypoxic respiratory failure in the setting of aspiration pneumonia/ R sided -Improving extubated 5/30  CT Chest >> Negative for PE, Multifocal pneumonia, debris noted in trachea, small to moderate bilateral pleural effusions. -very poor resp mechanics and poor cough   P:  Wean O2 for sats >90%  CPAP at bedtime for suspected underlying obstructive sleep apnea -monitor closely for airway protection, avoid oversedation  -needs to mobilize /up right position/chair if able will help with resp mechanics  -BD As needed    Aspiration Pneumonia>> Multifocal per CT WBC down trending, procalcitonin trending down P:   Continue  Unasyn  Trend fever curve/ CBC Trend CXR   Neck Circumference most likely > 37 cm Obese Plan -Will need follow up as OP for assessment of OSA with sleep study  Subacute R MCA stroke - MRI unable to be performed due to metal object seen in Left eye on CT CT 5/29 large right MCA stable in size mild regional mass-effect with a 2 mm right to left shift, negative for acute large vessel occlusion, 70% stenosis at the proximal right ICA.  Left carotid artery patent.  Occlusion of the right vertebral artery.  Dominant left vertebral artery patent. P:  Per Neurology Serial neuro exams ASA per Tube per neuro  SIRS/ sepsis +/- cardiogenic component given elevated troponin and BNP  -LA tr down  A fib - Chronic hx , no AC due to PUD /Anemia in  past  Elevated Troponin -cardiology consult 5/30 recs medical management , OP assess, felt demand in setting of hypotension/rhabdo, AFib .  CHF -Echo EF 35-40% 5/29 (improved from 2015 prev EF 30%)  P:  Goal MAP >65 Trend troponin  Trend BNP  ASA  Cont Dig (dig level subtherapeutic)  Metoprolol on hold   At risk AKI>> scr improving  Elevated CK/ rhabdo-CK Tr down  Hypo mag Hypo Phos Hypo K P:  Mg/KPhos replaced  Decrease LR KVO  Monitor UOP/ BMP Trend CK  Foley cath Monitor and replace electrolytes as indicated.    ETOH abuse Currently on Precedex- P:  Daily thiamine/ folate/ MVI Monitor for withdrawals>> may need ativan intermittently  May need to replace OGT to help with meds admin to help with agitation to wean precedex .   Hx PUD/ prior GIB High risk for aspiration  P:  PPI BID  Trend CBC Consider cortrak if unable to pass swallow , speech following .   DMT2 P:  CBG q 4 SSI sensitive   HLD P:  Daily Lipitor   Best  practice:  Diet:  NPO , speech following  Pain/Anxiety/Delirium protocol precedex  VAP protocol (if indicated): na  DVT prophylaxis: heparin SQ/ SCDs GI prophylaxis: PPI  Glucose control: CBG q 4 Mobility: PT to work with pt  Code Status: Full  Family Communication:  Updated wife 06/09/18 .  Disposition: ICU  Labs   CBC: Recent Labs  Lab 06/06/18 2051  06/07/18 0628 06/07/18 1108 06/08/18 0309 06/09/18 0331 06/10/18 0421  WBC 15.9*  --  19.2*  --  11.3* 10.0 10.1  NEUTROABS 13.1*  --   --   --   --   --   --   HGB 14.2   < > 15.4 14.6 12.8* 13.0 12.3*  HCT 43.6   < > 45.6 43.0 38.3* 39.3 37.7*  MCV 93.2  --  91.2  --  90.3 92.9 93.8  PLT 240  --  247  --  175 175 200   < > = values in this interval not displayed.    Basic Metabolic Panel: Recent Labs  Lab 06/07/18 0628 06/07/18 1108 06/07/18 1511  06/08/18 0309 06/08/18 1006 06/08/18 1641 06/09/18 0331 06/09/18 1654 06/10/18 0421  NA 143 139  --   --  139 137  --   141  --  143  K 3.5 3.5  --   --  3.4* 4.3  --  2.9*  --  3.2*  CL 104  --   --   --  105 106  --  99  --  102  CO2 28  --   --   --  22 17*  --  27  --  28  GLUCOSE 130*  --   --   --  137* 104*  --  157*  --  137*  BUN 14  --   --   --  12 36*  --  12  --  9  CREATININE 0.96  --   --   --  1.00 1.57*  --  0.82  --  0.82  CALCIUM 9.1  --   --   --  8.2* 8.4*  --  8.2*  --  8.4*  MG  --   --  1.3*  --  1.3*  --   --  1.8  --  1.7  PHOS  --   --   --    < > 2.1* 4.9* 2.7 2.1* 4.2 4.3   < > = values in this interval not displayed.   GFR: Estimated Creatinine Clearance: 101.4 mL/min (by C-G formula based on SCr of 0.82 mg/dL). Recent Labs  Lab 06/06/18 2212 06/07/18 0628 06/07/18 1055 06/07/18 1238 06/08/18 0309 06/08/18 1006 06/09/18 0331 06/10/18 0421  PROCALCITON  --   --   --   --   --  0.37 0.25  --   WBC  --  19.2*  --   --  11.3*  --  10.0 10.1  LATICACIDVEN 2.5*  --  3.0* 3.2*  --   --  1.7  --     Liver Function Tests: Recent Labs  Lab 06/06/18 2051 06/08/18 0309 06/08/18 1006  AST 60*  --  30  ALT 22  --  15  ALKPHOS 67  --  57  BILITOT 1.9*  --  0.6  PROT 6.7  --  5.6*  ALBUMIN 4.1 2.9* 2.9*   Recent Labs  Lab 06/06/18 2051  LIPASE 22   No results for input(s): AMMONIA in the  last 168 hours.  ABG    Component Value Date/Time   PHART 7.394 06/07/2018 1108   PCO2ART 37.8 06/07/2018 1108   PO2ART 170.0 (H) 06/07/2018 1108   HCO3 23.1 06/07/2018 1108   TCO2 24 06/07/2018 1108   ACIDBASEDEF 2.0 06/07/2018 1108   O2SAT 100.0 06/07/2018 1108     Coagulation Profile: Recent Labs  Lab 06/06/18 2051  INR 1.1    Cardiac Enzymes: Recent Labs  Lab 06/06/18 2306  06/07/18 2114 06/08/18 0309 06/09/18 0331 06/09/18 0656 06/09/18 0819 06/09/18 1413 06/09/18 1654 06/10/18 0421  CKTOTAL 2,395*  --   --  768*  --   --  439*  --   --  414*  CKMB  --   --   --   --   --   --  3.0  --   --   --   TROPONINI  --    < > 1.43*  --  2.50* 2.07*  --  2.02*  1.77*  --    < > = values in this interval not displayed.    HbA1C: Hgb A1c MFr Bld  Date/Time Value Ref Range Status  06/07/2018 06:28 AM 5.5 4.8 - 5.6 % Final    Comment:    (NOTE) Pre diabetes:          5.7%-6.4% Diabetes:              >6.4% Glycemic control for   <7.0% adults with diabetes   02/09/2018 10:10 AM 5.6 4.6 - 6.5 % Final    Comment:    Glycemic Control Guidelines for People with Diabetes:Non Diabetic:  <6%Goal of Therapy: <7%Additional Action Suggested:  >8%     CBG: Recent Labs  Lab 06/09/18 1522 06/09/18 2026 06/09/18 2355 06/10/18 0340 06/10/18 0817  GLUCAP 109* 88 110* 123* 96    Review of Systems:   Unable  Past Medical History  He,  has a past medical history of Alcohol abuse, Arthritis, Atrial fibrillation ([place]), Gastric ulcer, Gout, History of digital clubbing, Hyperkalemia (04/2017), Hyperlipidemia (07/2016), Hypertension, Nonischemic cardiomyopathy ([place]), Nonrheumatic mitral valve regurgitation, Osteoarthritis of right hip, Tobacco abuse, and Type II diabetes mellitus ([place]) (07/2016).   Surgical History    Past Surgical History:  Procedure Laterality Date  . CARDIOVASCULAR STRESS TEST    . CARDIOVERSION     DC  . TONSILLECTOMY       Social History   reports that he quit smoking about 5 years ago. His smoking use included cigarettes. He has a 5.00 pack-year smoking history. He has never used smokeless tobacco. He reports current alcohol use. He reports that he does not use drugs.   Family History   His family history includes Alcohol abuse in his father; Congestive Heart Failure in his mother; Diabetes in his mother; Heart disease in his paternal grandfather; Hypertension in his father; Stroke in his father.   Allergies No Known Allergies   Home Medications  Prior to Admission medications   Medication Sig Start Date End Date Taking? Authorizing Provider  allopurinol (ZYLOPRIM) 100 MG tablet 2 tabs po qd 03/14/18   McGowen, Adrian Blackwater, MD   digoxin (LANOXIN) 0.125 MG tablet Take 1 tablet by mouth daily. 07/25/16   [provider]  glucose blood (CONTOUR NEXT TEST) test strip Use to check blood sugar once daily. 10/07/16   McGowen, Adrian Blackwater, MD  lisinopril (PRINIVIL,ZESTRIL) 40 MG tablet Take 1 tablet by mouth daily. 07/25/16   [provider]  metFORMIN (  GLUCOPHAGE) 500 MG tablet TAKE 1 TABLET AT BEDTIME 08/14/17   McGowen, Adrian Blackwater, MD  metoprolol succinate (TOPROL-XL) 100 MG 24 hr tablet Take 1 tablet by mouth 2 (two) times daily. 07/25/16   [provider]  MICROLET LANCETS MISC Use to check blood sugar once daily 11/15/16   McGowen, Adrian Blackwater, MD  pantoprazole (PROTONIX) 40 MG tablet Take 40 mg by mouth 2 (two) times daily. 08/01/16   [provider]  predniSONE (DELTASONE) 20 MG tablet 2 tabs po qd x 5d, then 1 tab po qd x 5d 03/14/18   McGowen, Adrian Blackwater, MD     Critical care time:      Tammy Parrett NP-C  [facility]  [phone]  06/10/2018   06/10/2018, 8:27 AM

## 2018-06-10 NOTE — Progress Notes (Signed)
Progress Note  Patient Name: Robert [name] Date of Encounter: 06/10/2018  Primary Cardiologist: Osborne Oman  Subjective   Pt extubated   Mumbles/talks  Difficut to understand    Inpatient Medications    Scheduled Meds: . aspirin  325 mg Per Tube Daily   Or  . aspirin  300 mg Rectal Daily  . atorvastatin  40 mg Per Tube q1800  . chlorhexidine gluconate (MEDLINE KIT)  15 mL Mouth Rinse BID  . Chlorhexidine Gluconate Cloth  6 each Topical Daily  . digoxin  0.125 mg Intravenous Daily  . folic acid  1 mg Per Tube Daily  . heparin  5,000 Units Subcutaneous Q8H  . insulin aspart  0-9 Units Subcutaneous Q4H  . mouth rinse  15 mL Mouth Rinse 10 times per day  . metoprolol tartrate  5 mg Intravenous Q6H  . multivitamin with minerals  1 tablet Per Tube Daily  . pantoprazole sodium  40 mg Per Tube BID  . sodium chloride HYPERTONIC  4 mL Nebulization QID  . thiamine  100 mg Intravenous Daily   Continuous Infusions: . sodium chloride 20 mL/hr at 06/10/18 1000  . sodium chloride    . ampicillin-sulbactam (UNASYN) IV Stopped (06/10/18 6213)  . dexmedetomidine 0.5 mcg/kg/hr (06/10/18 1000)  . feeding supplement (VITAL AF 1.2 CAL) Stopped (06/09/18 0815)  . lactated ringers 10 mL/hr at 06/10/18 1000  . potassium chloride 10 mEq (06/10/18 1016)   PRN Meds: sodium chloride, acetaminophen **OR** acetaminophen (TYLENOL) oral liquid 160 mg/5 mL **OR** acetaminophen, bisacodyl, docusate, fentaNYL (SUBLIMAZE) injection, ipratropium-albuterol, metoprolol tartrate, senna-docusate   Vital Signs    Vitals:   06/10/18 0700 06/10/18 0800 06/10/18 0900 06/10/18 1000  BP: (!) 135/106 (!) 139/99 (!) 127/95 (!) 137/111  Pulse: (!) 108 83 (!) 48 (!) 46  Resp: 17 18 17 14   Temp:  (!) 97.4 F (36.3 C)    TempSrc:  Axillary    SpO2: 96% 99% 99% 100%  Weight:      Height:        Intake/Output Summary (Last 24 hours) at 06/10/2018 1042 Last data filed at 06/10/2018 1000 Gross per 24 hour  Intake  1370.11 ml  Output 2800 ml  Net -1429.89 ml   Net  +3.4 L    Last 3 Weights 06/10/2018 06/06/2018 03/14/2018  Weight (lbs) 186 lb 1.1 oz 187 lb 178 lb  Weight (kg) 84.4 kg 84.823 kg 80.74 kg      Telemetry    Afib  80s   - Personally Reviewed  ECG    None   Physical Exam  Pt extubated   With mittens on   GEN: No acute distress.   Neck: Neck is full   Cardiac: Irreg irreg   , no murmurs, rubs, or gallops.  Respiratory: Coarse rhonchi, rales bilaterally  GI: Soft, nontender, non-distended  MS: Tr edema; No deformity. Neuro: Deferred    Labs    Chemistry Recent Labs  Lab 06/06/18 2051  06/08/18 0309 06/08/18 1006 06/09/18 0331 06/10/18 0421  NA 140   < > 139 137 141 143  K 4.0   < > 3.4* 4.3 2.9* 3.2*  CL 99   < > 105 106 99 102  CO2 26   < > 22 17* 27 28  GLUCOSE 125*   < > 137* 104* 157* 137*  BUN 16   < > 12 36* 12 9  CREATININE 1.16   < > 1.00 1.57* 0.82 0.82  CALCIUM 9.5   < >  8.2* 8.4* 8.2* 8.4*  PROT 6.7  --   --  5.6*  --   --   ALBUMIN 4.1  --  2.9* 2.9*  --   --   AST 60*  --   --  30  --   --   ALT 22  --   --  15  --   --   ALKPHOS 67  --   --  57  --   --   BILITOT 1.9*  --   --  0.6  --   --   GFRNONAA >60   < > >60 48* >60 >60  GFRAA >60   < > >60 55* >60 >60  ANIONGAP 15   < > 12 14 15 13    < > = values in this interval not displayed.     Hematology Recent Labs  Lab 06/08/18 0309 06/09/18 0331 06/10/18 0421  WBC 11.3* 10.0 10.1  RBC 4.24 4.23 4.02*  HGB 12.8* 13.0 12.3*  HCT 38.3* 39.3 37.7*  MCV 90.3 92.9 93.8  MCH 30.2 30.7 30.6  MCHC 33.4 33.1 32.6  RDW 13.7 13.7 13.4  PLT 175 175 200    Cardiac Enzymes Recent Labs  Lab 06/09/18 0331 06/09/18 0656 06/09/18 1413 06/09/18 1654  TROPONINI 2.50* 2.07* 2.02* 1.77*    Recent Labs  Lab 06/06/18 2114  TROPIPOC 0.24*     BNP Recent Labs  Lab 06/06/18 2052 06/09/18 0331  BNP 1,124.6* 633.9*     DDimer No results for input(s): DDIMER in the last 168 hours.    Radiology    Dg Chest Port 1 View  Result Date: 06/10/2018 CLINICAL DATA:  Pneumonia EXAM: PORTABLE CHEST 1 VIEW COMPARISON:  06/09/2018 FINDINGS: Endotracheal and NG tubes removed. Normal heart size. Bibasilar hazy airspace opacity has increased. Upper lungs remain clear. No pneumothorax. IMPRESSION: Increasing bibasilar hazy opacity likely due to worsening airspace disease. Pleural effusions are not excluded. Electronically Signed   By: Marybelle Killings M.D.   On: 06/10/2018 09:01   Dg Chest Port 1 View  Result Date: 06/09/2018 CLINICAL DATA:  Respiratory failure EXAM: PORTABLE CHEST 1 VIEW COMPARISON:  06/07/2018 chest radiograph. FINDINGS: Enteric tube terminates in the proximal stomach. Endotracheal tube tip is 7.2 cm above the carina. Stable cardiomediastinal silhouette with normal heart size. No pneumothorax. Stable small right pleural effusion. No significant left pleural effusion. Right perihilar opacity has decreased. Similar patchy left retrocardiac opacity. IMPRESSION: 1. Well-positioned support structures. 2. Decreased right perihilar opacity, suggesting improving pulmonary edema or improving atelectasis. 3. Stable small right pleural effusion. 4. Similar patchy left retrocardiac opacity. Electronically Signed   By: Ilona Sorrel M.D.   On: 06/09/2018 09:35    Cardiac Studies    1. The left ventricle has moderately reduced systolic function, with an ejection fraction of 35-40%. The cavity size was normal. Left ventricular diastolic function could not be evaluated secondary to atrial fibrillation.  2. The right ventricle has normal systolic function. The cavity was normal. There is no increase in right ventricular wall thickness.  3. Left atrial size was severely dilated.  4. Right atrial size was mildly dilated.  5. Mitral valve regurgitation is mild to moderate by color flow Doppler.  6. Tricuspid valve regurgitation is moderate-severe.  7. The interatrial septum was not assessed.   FINDINGS  Left Ventricle: The left ventricle has moderately reduced systolic function, with an ejection fraction of 35-40%. The cavity size was normal. There is borderline increase in left  ventricular wall thickness. Left ventricular diastolic function could not  be evaluated secondary to atrial fibrillation. Definity contrast agent was given IV to delineate the left ventricular endocardial borders.  Right Ventricle: The right ventricle has normal systolic function. The cavity was normal. There is no increase in right ventricular wall thickness.  Left Atrium: Left atrial size was severely dilated.  Right Atrium: Right atrial size was mildly dilated. Right atrial pressure is estimated at 8 mmHg.  Interatrial Septum: The interatrial septum was not assessed.  Pericardium: There is no evidence of pericardial effusion.  Mitral Valve: The mitral valve is normal in structure. Mitral valve regurgitation is mild to moderate by color flow Doppler.  Tricuspid Valve: The tricuspid valve is normal in structure. Tricuspid valve regurgitation is moderate-severe by color flow Doppler.  Aortic Valve: The aortic valve is normal in structure. Aortic valve regurgitation was not visualized by color flow Doppler.  Pulmonic Valve: The pulmonic valve was grossly normal. Pulmonic valve regurgitation is not visualized by color flow Doppler.  Patient Profile     58 y.o. male   Assessment & Plan   \ 1  Atrial fibrillation  Persistent  Rates are better     Keep on current lopressor IV q 6 hours until taking PO Note by neuro, pt agrees to anticoagulation.   WIll need to review with him when he is coherent    Hx EtOH abuse   Will need to abstain esp with GI bleeding hx  2  Elevated troponin    Trending down   Pt appears comfortable   Not good historian   With CVA, treat medically   May reflect some demand ischemia   Hx NICM in past    At most consider noninvasive eval I think for now   3  Hx NICM     Pt has been followed at [facility] in 2016 LVEF 30 to 35^  Echo here is noted above    Titrate meds when takes PO (ARB/Entresto; more b blocker)  4  CVA   Followed by neuro   Plan to start anticoagulation  5  PVOD    Continue statin For questions or updates, please contact [facility] Please consult [email or web address] for contact info under        Signed, Dorris Carnes, MD  06/10/2018, 10:42 AM

## 2018-06-10 NOTE — Progress Notes (Signed)
Pharmacy Antibiotic Note  Robert [name] is a 58 y.o. male admitted on 06/06/2018 due to stroke.  He is being treated with Unasyn for aspiration pneumonia.   He is on Day #4 of antibiotics. He is afebrile and his WBC is decreasing. No positive micro data.  Plan: Continue Unasyn 3g q6h    Height: 5\' 10"  (177.8 cm) Weight: 186 lb 1.1 oz (84.4 kg) IBW/kg (Calculated) : 73  Temp (24hrs), Avg:98.3 F (36.8 C), Min:97.4 F (36.3 C), Max:98.7 F (37.1 C)  Recent Labs  Lab 06/06/18 2051 06/06/18 2052 06/06/18 2212 06/07/18 0628 06/07/18 1055 06/07/18 1238 06/08/18 0309 06/08/18 1006 06/09/18 0331 06/10/18 0421  WBC 15.9*  --   --  19.2*  --   --  11.3*  --  10.0 10.1  CREATININE 1.16  --   --  0.96  --   --  1.00 1.57* 0.82 0.82  LATICACIDVEN  --  3.9* 2.5*  --  3.0* 3.2*  --   --  1.7  --     Estimated Creatinine Clearance: 101.4 mL/min (by C-G formula based on SCr of 0.82 mg/dL).    No Known Allergies   Thank you for involving pharmacy in this patient's care.  Legrand Como, Pharm.D., BCPS, BCIDP Clinical Pharmacist Phone: [phone] Please check AMION for all [place] numbers 06/10/2018, 9:34 AM

## 2018-06-10 NOTE — Progress Notes (Signed)
SLP Cancellation Note  Patient Details Name: Robert [name] MRN: [id number] DOB: 07-24-1960   Cancelled treatment:       Reason Eval/Treat Not Completed: Other (comment)  ST follow up for possible re assessment of swallowing.  Per nurse patient continues to have copious secretions with issues managing them requiring NT suction.  She reported possible placement of a Cortrak.  Given this ST will hold off on re evaluation and follow up next date to assess for readiness for repeat swallowing evaluation and possible MBS.     Shelly Flatten, MA, [facility] SLP [phone]  [name] 06/10/2018, 9:48 AM

## 2018-06-10 NOTE — Progress Notes (Signed)
Patient became increasingly agitated throughout the night. Precedex drip was restarted prior to midnight and MD made aware of agitation and CIWA. Patients heart rate kept increasing with increased agitation up to 200s briefly. See MAR for prns and scheduled meds give. CIWA documented. One time dose of Ativan given per MD order. Patient did inform RN at the beginning of shift that he normally drinks at least one pint of liquor daily. Patient has been resting since Ativan was given. RN will continue to monitor.

## 2018-06-10 NOTE — Evaluation (Signed)
Physical Therapy Evaluation Patient Details Name: Robert [name] MRN: [id number] DOB: 1960-03-07 Today's Date: 06/10/2018   History of Present Illness  pt is a 58 yo male adm to [facility] with large right MCA territory CVA, PMH + for ETOH use, HTN, DM2, Gi bleed, and pt developed aspiration pna and was intubated on 5/25-5/30/2020.  Clinical Impression   Pt admitted with above diagnosis. Pt currently with functional limitations due to the deficits listed below (see PT Problem List). Unreliable historian; He is confused with significant cognitive deficit and perseverates on need to go home today.  He demonstrates Rt gaze preference, and pushes heavily to the left and posteriorly requiring max A +2 to maintain EOB sitting.  Unable to safely attempt standing, and unable to safely transfer pt to recliner as pt pushes so heavily to the left and posteriorly placing at risk for falling out of chair.  He currently requires total A for all aspects of ADLs.   He reports he lived with his wife and was independent with ADLs and functional mobility until ~ 6 days PTA, when he had to start using RW.   Recommend SNF level rehab at discharge as he will likely require increased recovery time, and may need extensive assist after rehab. Will follow acutely  Pt will benefit from skilled PT to increase their independence and safety with mobility to allow discharge to the venue listed below.       Follow Up Recommendations SNF    Equipment Recommendations  Other (comment)(to be determined)    Recommendations for Other Services       Precautions / Restrictions Precautions Precautions: Fall Restrictions Weight Bearing Restrictions: No      Mobility  Bed Mobility Overal bed mobility: Needs Assistance Bed Mobility: Supine to Sit;Sit to Supine;Rolling Rolling: Total assist;+2 for physical assistance   Supine to sit: Max assist;+2 for physical assistance;+2 for safety/equipment Sit to supine: Max assist;+2 for  physical assistance;+2 for safety/equipment   General bed mobility comments: He requires total A +2 for rolling as he pushes heavily and has fear of falling Pt able to move LEs off EOB with miA, but requires max A to lift trunk off bed .  Assist to return to supine   Transfers                 General transfer comment: unable to safely attempt   Ambulation/Gait                Stairs            Wheelchair Mobility    Modified Rankin (Stroke Patients Only)       Balance Overall balance assessment: Needs assistance Sitting-balance support: Feet supported;Single extremity supported Sitting balance-Leahy Scale: Poor Sitting balance - Comments: Pt requires max A +2 to safely maintain EOB sitting.  Pt pushing heavily to the Left and posteriorly, extends Lt LE.  He has significant fear of falling which compounds his pushing   Postural control: Posterior lean                                   Pertinent Vitals/Pain Pain Assessment: Faces Faces Pain Scale: Hurts whole lot Pain Location: Rt hip  Pain Descriptors / Indicators: Grimacing;Guarding;Restless;Moaning Pain Intervention(s): Monitored during session    Home Living Family/patient expects to be discharged to:: Unsure Living Arrangements: Spouse/significant other Available Help at Discharge: Family;Available 24 hours/day Type of Home: House  Home Access: Level entry     Home Layout: One level Home Equipment: Walker - 2 wheels;Grab bars - tub/shower Additional Comments: No family available to confirm above information     Prior Function Level of Independence: Independent         Comments: Pt report he drives and was independent until ~ 6 days PTA at which time he had to use RW due to Rt hip/knee pain      Hand Dominance   Dominant Hand: Right    Extremity/Trunk Assessment   Upper Extremity Assessment Upper Extremity Assessment: Defer to OT evaluation    Lower Extremity  Assessment Lower Extremity Assessment: Generalized weakness(Noting coordination deficits, and difficulty breaking extension patterns with rolling and bed mobility)    Cervical / Trunk Assessment Cervical / Trunk Assessment: Other exceptions Cervical / Trunk Exceptions: Rt gaze preference with head/neck rotated to the Rt.  Poor trunk control, and decreased isolation of trunk  movements   Communication   Communication: Expressive difficulties(dysarthria )  Cognition Arousal/Alertness: Awake/alert Behavior During Therapy: Restless;Anxious;Impulsive Overall Cognitive Status: Impaired/Different from baseline Area of Impairment: Orientation;Attention;Memory;Following commands;Safety/judgement;Awareness;Problem solving                 Orientation Level: Disoriented to;Time Current Attention Level: Sustained Memory: Decreased short-term memory Following Commands: Follows one step commands inconsistently(follows 1 step commands when there are no distractions ) Safety/Judgement: Decreased awareness of deficits;Decreased awareness of safety Awareness: Intellectual Problem Solving: Slow processing;Decreased initiation;Difficulty sequencing;Requires verbal cues;Requires tactile cues General Comments: Pt very anxious.  He is confabulatory.  He frequently self disracts with need to go home.         General Comments General comments (skin integrity, edema, etc.): VSS with 02 sats at >90% on RA     Exercises     Assessment/Plan    PT Assessment Patient needs continued PT services  PT Problem List Decreased strength;Decreased range of motion;Decreased activity tolerance;Decreased balance;Decreased mobility;Decreased coordination;Decreased cognition;Decreased knowledge of use of DME;Decreased safety awareness;Decreased knowledge of precautions;Cardiopulmonary status limiting activity       PT Treatment Interventions DME instruction;Gait training;Functional mobility training;Therapeutic  activities;Therapeutic exercise;Balance training;Neuromuscular re-education;Cognitive remediation;Patient/family education;Wheelchair mobility training    PT Goals (Current goals can be found in the Care Plan section)  Acute Rehab PT Goals Patient Stated Goal: to leave today  PT Goal Formulation: Patient unable to participate in goal setting Time For Goal Achievement: 06/24/18 Potential to Achieve Goals: Fair    Frequency Min 2X/week   Barriers to discharge        Co-evaluation PT/OT/SLP Co-Evaluation/Treatment: Yes Reason for Co-Treatment: Complexity of the patient's impairments (multi-system involvement);For patient/therapist safety;Necessary to address cognition/behavior during functional activity PT goals addressed during session: Mobility/safety with mobility OT goals addressed during session: Strengthening/ROM;ADL's and self-care       AM-PAC PT "6 Clicks" Mobility  Outcome Measure Help needed turning from your back to your side while in a flat bed without using bedrails?: Total Help needed moving from lying on your back to sitting on the side of a flat bed without using bedrails?: Total Help needed moving to and from a bed to a chair (including a wheelchair)?: Total Help needed standing up from a chair using your arms (e.g., wheelchair or bedside chair)?: Total Help needed to walk in hospital room?: Total Help needed climbing 3-5 steps with a railing? : Total 6 Click Score: 6    End of Session   Activity Tolerance: Patient tolerated treatment well Patient left: in bed;with call bell/phone  within reach;with bed alarm set Nurse Communication: Mobility status PT Visit Diagnosis: Unsteadiness on feet (R26.81);Other abnormalities of gait and mobility (R26.89);Other symptoms and signs involving the nervous system (R29.898)    Time: 9810-2548 PT Time Calculation (min) (ACUTE ONLY): 30 min   Charges:   PT Evaluation $PT Eval Moderate Complexity: 1 Mod          Roney [place], [place]  Acute Rehabilitation Services Pager [phone] Office [phone]   Colletta [place] 06/10/2018, 4:39 PM

## 2018-06-10 NOTE — Progress Notes (Signed)
Patient NTS for a moderate amount of thin white/yellow secretions. Vitals stable.

## 2018-06-10 NOTE — Progress Notes (Signed)
eLink Physician-Brief Progress Note Patient Name: Robert [name] DOB: 03-12-1960 MRN: [id number]   Date of Service  06/10/2018  HPI/Events of Note  Hypokalemia and hypomag  eICU Interventions  Potassium and Mag replaced     Intervention Category Intermediate Interventions: Electrolyte abnormality - evaluation and management  Eirik Schueler 06/10/2018, 6:03 AM

## 2018-06-10 NOTE — Evaluation (Signed)
Occupational Therapy Evaluation Patient Details Name: Robert [name] MRN: [id number] DOB: May 28, 1960 Today's Date: 06/10/2018    History of Present Illness pt is a 58 yo male adm to [facility] with large right MCA territory CVA, PMH + for ETOH use, HTN, DM2, Gi bleed, and pt developed aspiration pna and was intubated on 5/25-5/30/2020.   Clinical Impression   Pt admitted with above. He demonstrates the below listed deficits and will benefit from continued OT to maximize safety and independence with BADLs.  Pt seen in conjunction with PT.  He is confused with significant cognitive deficit and perseverates on need to go home today.  He demonstrates Rt gaze preference, and pushes heavily to the left and posteriorly requiring max A +2 to maintain EOB sitting.  Unable to safely attempt standing, and unable to safely transfer pt to recliner as pt pushes so heavily to the left and posteriorly placing at risk for falling out of chair.  He currently requires total A for all aspects of ADLs.   He reports he lived with his wife and was independent with ADLs and functional mobility until ~ 6 days PTA, when he had to start using RW.   Recommend SNF level rehab at discharge as he will likely require increased recovery time, and may need extensive assist after rehab. Will follow acutely       Follow Up Recommendations  SNF;Supervision/Assistance - 24 hour    Equipment Recommendations  None recommended by OT    Recommendations for Other Services       Precautions / Restrictions Precautions Precautions: Fall Restrictions Weight Bearing Restrictions: No      Mobility Bed Mobility Overal bed mobility: Needs Assistance Bed Mobility: Supine to Sit;Sit to Supine;Rolling Rolling: Total assist;+2 for physical assistance   Supine to sit: Max assist;+2 for physical assistance;+2 for safety/equipment Sit to supine: Max assist;+2 for physical assistance;+2 for safety/equipment   General bed mobility comments: He  requires total A +2 for rolling as he pushes heavily and has fear of falling Pt able to move LEs off EOB with miA, but requires max A to lift trunk off bed .  Assist to return to supine   Transfers                 General transfer comment: unable to safely attempt     Balance Overall balance assessment: Needs assistance Sitting-balance support: Feet supported;Single extremity supported Sitting balance-Leahy Scale: Poor Sitting balance - Comments: Pt requires max A +2 to safely maintain EOB sitting.  Pt pushing heavily to the Left and posteriorly, extends Lt LE.  He has significant fear of falling which compounds his pushing                                     ADL either performed or assessed with clinical judgement   ADL Overall ADL's : Needs assistance/impaired Eating/Feeding: NPO   Grooming: Wash/dry hands;Oral care;Wash/dry face;Maximal assistance;Sitting   Upper Body Bathing: Total assistance;Sitting   Lower Body Bathing: Total assistance;Bed level   Upper Body Dressing : Total assistance;Sitting   Lower Body Dressing: +2 for physical assistance;Bed level;+2 for safety/equipment   Toilet Transfer: Total assistance Toilet Transfer Details (indicate cue type and reason): unable to safely attempt  Toileting- Clothing Manipulation and Hygiene: Total assistance;+2 for safety/equipment;+2 for physical assistance;Bed level       Functional mobility during ADLs: Maximal assistance;+2 for physical assistance;+2 for  safety/equipment(bed mobility only )       Vision Baseline Vision/History: Wears glasses Wears Glasses: Reading only Patient Visual Report: No change from baseline Additional Comments: Pt with Rt gaze preference.  Not formally assessed due to level of attention      Perception Perception Perception Tested?: Yes Perception Deficits: Inattention/neglect Inattention/Neglect: Does not attend to left visual field   Praxis Praxis Praxis tested?:  Deficits Deficits: Initiation;Ideation    Pertinent Vitals/Pain Pain Assessment: Faces Faces Pain Scale: Hurts whole lot Pain Location: Rt hip  Pain Descriptors / Indicators: Grimacing;Guarding;Restless;Moaning Pain Intervention(s): Monitored during session;Repositioned;Limited activity within patient's tolerance     Hand Dominance Right   Extremity/Trunk Assessment Upper Extremity Assessment Upper Extremity Assessment: Generalized weakness(full AROM bil. UEs )   Lower Extremity Assessment Lower Extremity Assessment: Defer to PT evaluation   Cervical / Trunk Assessment Cervical / Trunk Assessment: Other exceptions Cervical / Trunk Exceptions: Rt gaze preference with head/neck rotated to the Rt.  Poor trunk control, and decreased isolation of trunk  movements    Communication Communication Communication: Expressive difficulties(dysarthria )   Cognition Arousal/Alertness: Awake/alert Behavior During Therapy: Restless;Anxious;Impulsive Overall Cognitive Status: Impaired/Different from baseline Area of Impairment: Orientation;Attention;Memory;Following commands;Safety/judgement;Awareness;Problem solving                 Orientation Level: Disoriented to;Time Current Attention Level: Sustained Memory: Decreased short-term memory Following Commands: Follows one step commands inconsistently(follows 1 step commands when there are no distractions ) Safety/Judgement: Decreased awareness of deficits;Decreased awareness of safety Awareness: Intellectual Problem Solving: Slow processing;Decreased initiation;Difficulty sequencing;Requires verbal cues;Requires tactile cues General Comments: Pt very anxious.  He is confabulatory.  He frequently self disracts with need to go home.      General Comments  VSS with 02 sats at >90% on RA     Exercises     Shoulder Instructions      Home Living Family/patient expects to be discharged to:: Unsure Living Arrangements:  Spouse/significant other Available Help at Discharge: Family;Available 24 hours/day Type of Home: House Home Access: Level entry     Home Layout: One level     Bathroom Shower/Tub: Occupational psychologist: Standard     Home Equipment: Environmental consultant - 2 wheels;Grab bars - tub/shower   Additional Comments: No family available to confirm above information       Prior Functioning/Environment Level of Independence: Independent        Comments: Pt report he drives and was independent until ~ 6 days PTA at which time he had to use RW due to Rt hip/knee pain         OT Problem List: Decreased strength;Decreased activity tolerance;Impaired balance (sitting and/or standing);Impaired vision/perception;Decreased coordination;Decreased cognition;Decreased knowledge of use of DME or AE;Decreased safety awareness;Decreased knowledge of precautions;Impaired tone;Obesity;Pain      OT Treatment/Interventions: Self-care/ADL training;Therapeutic exercise;Neuromuscular education;DME and/or AE instruction;Manual therapy;Therapeutic activities;Cognitive remediation/compensation;Visual/perceptual remediation/compensation;Patient/family education;Balance training    OT Goals(Current goals can be found in the care plan section) Acute Rehab OT Goals Patient Stated Goal: to leave today  OT Goal Formulation: With patient Time For Goal Achievement: 07/22/18 Potential to Achieve Goals: Good  OT Frequency: Min 2X/week   Barriers to D/C: Decreased caregiver support  unsure wife can provide current level of assist        Co-evaluation PT/OT/SLP Co-Evaluation/Treatment: Yes Reason for Co-Treatment: Complexity of the patient's impairments (multi-system involvement);For patient/therapist safety;Necessary to address cognition/behavior during functional activity;To address functional/ADL transfers   OT goals addressed during session: Strengthening/ROM;ADL's and self-care  AM-PAC OT "6 Clicks"  Daily Activity     Outcome Measure Help from another person eating meals?: Total Help from another person taking care of personal grooming?: Total Help from another person toileting, which includes using toliet, bedpan, or urinal?: Total Help from another person bathing (including washing, rinsing, drying)?: Total Help from another person to put on and taking off regular upper body clothing?: Total Help from another person to put on and taking off regular lower body clothing?: Total 6 Click Score: 6   End of Session Nurse Communication: Mobility status  Activity Tolerance: Patient limited by pain;Patient limited by lethargy Patient left: in bed;with call bell/phone within reach;with bed alarm set  OT Visit Diagnosis: Unsteadiness on feet (R26.81);Cognitive communication deficit (R41.841) Symptoms and signs involving cognitive functions: Cerebral infarction                Time: 2353-6144 OT Time Calculation (min): 32 min Charges:  OT General Charges $OT Visit: 1 Visit OT Evaluation $OT Eval Moderate Complexity: 1 Mod  Lucille Passy, OTR/L Acute Rehabilitation Services Pager [phone] Office [phone]   Lucille Passy M 06/10/2018, 3:27 PM

## 2018-06-11 DIAGNOSIS — R4182 Altered mental status, unspecified: Secondary | ICD-10-CM

## 2018-06-11 DIAGNOSIS — I10 Essential (primary) hypertension: Secondary | ICD-10-CM

## 2018-06-11 DIAGNOSIS — F1010 Alcohol abuse, uncomplicated: Secondary | ICD-10-CM

## 2018-06-11 DIAGNOSIS — I63311 Cerebral infarction due to thrombosis of right middle cerebral artery: Secondary | ICD-10-CM

## 2018-06-11 DIAGNOSIS — I5043 Acute on chronic combined systolic (congestive) and diastolic (congestive) heart failure: Secondary | ICD-10-CM

## 2018-06-11 LAB — BASIC METABOLIC PANEL
Anion gap: 15 (ref 5–15)
BUN: 12 mg/dL (ref 6–20)
CO2: 23 mmol/L (ref 22–32)
Calcium: 8.5 mg/dL — ABNORMAL LOW (ref 8.9–10.3)
Chloride: 104 mmol/L (ref 98–111)
Creatinine, Ser: 0.9 mg/dL (ref 0.61–1.24)
GFR calc Af Amer: >60 mL/min (ref >60)
GFR calc non Af Amer: >60 mL/min (ref >60)
Glucose, Bld: 115 mg/dL — ABNORMAL HIGH (ref 70–99)
Potassium: 4 mmol/L (ref 3.5–5.1)
Sodium: 142 mmol/L (ref 135–145)

## 2018-06-11 LAB — CBC
HCT: 39.1 % (ref 39.0–52.0)
Hemoglobin: 12.5 g/dL — ABNORMAL LOW (ref 13.0–17.0)
MCH: 30.1 pg (ref 26.0–34.0)
MCHC: 32 g/dL (ref 30.0–36.0)
MCV: 94.2 fL (ref 80.0–100.0)
Platelets: 198 10*3/uL (ref 150–400)
RBC: 4.15 MIL/uL — ABNORMAL LOW (ref 4.22–5.81)
RDW: 13.2 % (ref 11.5–15.5)
WBC: 8.2 10*3/uL (ref 4.0–10.5)
nRBC: 0 % (ref 0.0–0.2)

## 2018-06-11 LAB — MAGNESIUM: Magnesium: 1.9 mg/dL (ref 1.7–2.4)

## 2018-06-11 LAB — GLUCOSE, CAPILLARY
Glucose-Capillary: 106 mg/dL — ABNORMAL HIGH (ref 70–99)
Glucose-Capillary: 96 mg/dL (ref 70–99)
Glucose-Capillary: 85 mg/dL (ref 70–99)
Glucose-Capillary: 69 mg/dL — ABNORMAL LOW (ref 70–99)
Glucose-Capillary: 104 mg/dL — ABNORMAL HIGH (ref 70–99)
Glucose-Capillary: 134 mg/dL — ABNORMAL HIGH (ref 70–99)
Glucose-Capillary: 142 mg/dL — ABNORMAL HIGH (ref 70–99)

## 2018-06-11 LAB — PHOSPHORUS
Phosphorus: 3 mg/dL (ref 2.5–4.6)
Phosphorus: 3.4 mg/dL (ref 2.5–4.6)

## 2018-06-11 LAB — DIGOXIN LEVEL: Digoxin Level: 0.6 ng/mL — ABNORMAL LOW (ref 0.8–2.0)

## 2018-06-11 MED ORDER — VITAL HIGH PROTEIN PO LIQD
1000.0000 mL | ORAL | Status: DC
  Administered 2018-06-11: 1000 mL

## 2018-06-11 MED ORDER — ORAL CARE MOUTH RINSE
15.0000 mL | Freq: Two times a day (BID) | OROMUCOSAL | Status: DC
  Administered 2018-06-11 – 2018-06-19 (×12): 15 mL via OROMUCOSAL

## 2018-06-11 MED ORDER — MAGNESIUM SULFATE 2 GM/50ML IV SOLN
2.0000 g | Freq: Once | INTRAVENOUS | Status: AC
  Administered 2018-06-11: 2 g via INTRAVENOUS
  Filled 2018-06-11: qty 50

## 2018-06-11 MED ORDER — FUROSEMIDE 10 MG/ML IJ SOLN
40.0000 mg | Freq: Once | INTRAMUSCULAR | Status: AC
  Administered 2018-06-11: 40 mg via INTRAVENOUS
  Filled 2018-06-11: qty 4

## 2018-06-11 MED ORDER — CHLORHEXIDINE GLUCONATE 0.12 % MT SOLN
15.0000 mL | Freq: Two times a day (BID) | OROMUCOSAL | Status: DC
  Administered 2018-06-11 – 2018-06-19 (×18): 15 mL via OROMUCOSAL
  Filled 2018-06-11 (×17): qty 15

## 2018-06-11 MED ORDER — PRO-STAT SUGAR FREE PO LIQD
30.0000 mL | Freq: Two times a day (BID) | ORAL | Status: DC
  Administered 2018-06-11 – 2018-06-19 (×16): 30 mL
  Filled 2018-06-11 (×17): qty 30

## 2018-06-11 NOTE — Progress Notes (Signed)
NAME:  Robert [name], MRN:  [id number], DOB:  09/25/1960, LOS: 5 ADMISSION DATE:  06/06/2018, CONSULTATION DATE:  06/07/2018 REFERRING MD:  TRH, CHIEF COMPLAINT:  Hypoxia/ resp distress  Brief History   58 year old man with probable EtOH cardiomyopathy, Afib not on AC due to PUD/anemia, tobacco/EtOH abuse, presenting with R MCA stroke and likely aspiration pneumonitis.  Hospital course complicated by agitation ? w/d, rhabdomyolysis, afib/RVR, and secretion management.  Consulting services: Neurology, Cardiology, SLP, PT, OT  History of present illness   HPI obtained from medical chart review given patient's respiratory distress.   58 year old male with history of Afib not on AC 2/2 GIB, DMT2, PUD, HTN, HLD, gout, and ETOH abuse who presented from home on 5/27 with altered mental status and some slurred speech.  Patient lives in different rooms than his wife and last seen in normal 5/26 but she found him confused and laying on the ground.    His workup showed a large sub acute right MCA stroke.  His initial CXR was negative.  Neurology was consulted and he was admitted to medical floor to hospitalist service.  He remained NPO after failing swallow study. He has had positive troponin and BNP.  EKG with afib/ non acute.   Follow-up CTH showing evolving subacute R MCA infarction on the morning of 5/28.  Later that morning, he had acute hypoxic decompensation and respiratory distress requiring NRB and transferred to ICU with PCCM consultation.  He required emergent intubation on arrival to ICU.   Past Medical History  Afib not on AC 2/2 GIB, DMT2, PUD, HTN, HLD, gout, ETOH abuse, former smoker  Significant Hospital Events   5/27 Admitted 5/28 decompensated/ hypoxic/ intubated  5/30 extubated  Consults:  Neurology  Cardiology 5/30   Procedures:  5/28 ETT >>5/30  Significant Diagnostic Tests:  5/27 CTH and cervical >> CT of the head: Changes consistent with acute to subacute ischemia in  the distribution of the right middle cerebral artery. Encephalomalacia in the right posterior parietal lobe is noted consistent with prior ischemia. CT of cervical spine: Large anterior osteophytes without acute Abnormality.  5/28 CTH >> Evolving subacute infarction of the right MCA territory without significant change from the baseline CT, and no evidence hemorrhagic transformation. Redemonstration encephalomalacia of right parietal region.  CT Chest 5/29>> No PE identified on today's exam. The main pulmonary artery is dilated which can be seen in patients with elevated PA pressures. Extensive bilateral multifocal consolidation as detailed above concerning for multifocal pneumonia or aspiration. Some debris is noted within the trachea. The endotracheal tube terminates somewhat high at the level of the thoracic inlet. Repositioning should be considered. Small to moderate-sized bilateral pleural effusions. Enteric tube terminates in the stomach. Cardiomegaly. There is reflux of contrast in the IVC consistent with some degree of underlying cardiac dysfunction.  Micro Data:  5/27 SARS coronavirus 2>> negative 5/28 MRSA PCR >>NEG  5/28 trach asp >>normal resp flora  5/27 HIV neg  Antimicrobials:  5/28 unasyn >>  Interim history/subjective:  No major events overnight.  Patient's denies pain, dyspnea, chest pain, palpitations.  Remains agitated but redirectable.   Objective   Blood pressure (!) 142/97, pulse (!) 39, temperature 98.4 F (36.9 C), temperature source Oral, resp. rate (!) 22, height 5\' 10"  (1.778 m), weight 86.6 kg, SpO2 100 %.    Vent Mode: Stand-by   Intake/Output Summary (Last 24 hours) at 06/11/2018 0825 Last data filed at 06/11/2018 0600 Gross per 24 hour  Intake 1554.55 ml  Output 400 ml  Net 1154.55 ml   Filed Weights   06/06/18 2008 06/10/18 0500 06/11/18 0500  Weight: 84.8 kg 84.4 kg 86.6 kg   Examination: General: obese man in NAD HEENT: MM dry,  malampatti 4, R eye chemosis Neuro: awake, alert, L>R mild 5-/5 weakness, CN intact and summetric CV: Irregular irregular, no MRG PULM: transmitted upper airway sounds from retained oropharyngeal secretions, no accessory muscle use GI: Obese soft nontender, hypoactive BS Extremities: Warm and dry no edema  Skin: No rashes warm , few scrapes and bruises  Resolved Hospital Problem list   AKI, mild rhabdo  Assessment & Plan:  Aspiration pneumonia vs. Pneumonitis with acute hypoxic respiratory failure Culture negative Successfully extubated 06/09/18 PO status questionable -Continue unasyn, 5-7 days reasonable -Pulmonary toileting will be an issue, add CPT with -hypertonic nebs qid, upright as much as possible  Obesity Needs OP sleep referral  Subacute R MCA stroke MRI unable to be performed due to metal object seen in Left eye on CT CT 5/29 large right MCA stable in size mild regional mass-effect with a 2 mm right to left shift, negative for acute large vessel occlusion, 70% stenosis at the proximal right ICA.  Left carotid artery patent.  Occlusion of the right vertebral artery.  Dominant left vertebral artery patent. Echo/bubble w/o clot  -Appreciate neuro input -He will need NGT for ASA (full dose), statin -DOAC timing per neurology, suspect 2 weeks or so out due to size of infarct   Afib/RVR  Better controlled today Has some demand mismatch troponin leak Cardiology following -Cont PTA Dig (dig level subtherapeutic)  -ASA, statin -On metoprolol 5mg  q6h for now, switch to PO once access obtained   Encephalopathy/agitation Hx ETOH use Question of w/d vs. ICU delirium He is actually pretty oriented today  -Daily thiamine/ folate/ MVI, PO once NGT in place -Wean precedex as able, may end up starting seroquel or equivalent but again needs PO access for this  Hx PUD/ prior GIB -Monitor for s/s of bleeding -QOD CBC  DMT2 a1c 5.5% PTA metformin on hold FS are fine, DC SSI    HLD Statin when able  Best practice:  Diet:  cortrak placement consult, appreciate continued SLP help but he does not seem able to handle secretions at present  Pain/Anxiety/Delirium protocol precedex with plans to wean  VAP protocol (if indicated): na  DVT prophylaxis: heparin SQ/ SCDs GI prophylaxis: PPI  Glucose control: not needed Mobility: PT to work with pt  Code Status: Full  Family Communication:  Will call today.  Disposition: ICU pending precedex wean  Labs   CBC: Recent Labs  Lab 06/06/18 2051  06/07/18 0628 06/07/18 1108 06/08/18 0309 06/09/18 0331 06/10/18 0421 06/11/18 0554  WBC 15.9*  --  19.2*  --  11.3* 10.0 10.1 8.2  NEUTROABS 13.1*  --   --   --   --   --   --   --   HGB 14.2   < > 15.4 14.6 12.8* 13.0 12.3* 12.5*  HCT 43.6   < > 45.6 43.0 38.3* 39.3 37.7* 39.1  MCV 93.2  --  91.2  --  90.3 92.9 93.8 94.2  PLT 240  --  247  --  175 175 200 198   < > = values in this interval not displayed.    Basic Metabolic Panel: Recent Labs  Lab 06/07/18 1511  06/08/18 0309 06/08/18 1006 06/08/18 1641 06/09/18 0331 06/09/18 1654 06/10/18  0421 06/11/18 0554  NA  --   --  139 137  --  141  --  143 142  K  --   --  3.4* 4.3  --  2.9*  --  3.2* 4.0  CL  --   --  105 106  --  99  --  102 104  CO2  --   --  22 17*  --  27  --  28 23  GLUCOSE  --   --  137* 104*  --  157*  --  137* 115*  BUN  --   --  12 36*  --  12  --  9 12  CREATININE  --   --  1.00 1.57*  --  0.82  --  0.82 0.90  CALCIUM  --   --  8.2* 8.4*  --  8.2*  --  8.4* 8.5*  MG 1.3*  --  1.3*  --   --  1.8  --  1.7 1.9  PHOS  --    < > 2.1* 4.9* 2.7 2.1* 4.2 4.3 3.0   < > = values in this interval not displayed.   GFR: Estimated Creatinine Clearance: 92.4 mL/min (by C-G formula based on SCr of 0.9 mg/dL). Recent Labs  Lab 06/06/18 2212  06/07/18 1055 06/07/18 1238 06/08/18 0309 06/08/18 1006 06/09/18 0331 06/10/18 0421 06/11/18 0554  PROCALCITON  --   --   --   --   --  0.37 0.25   --   --   WBC  --    < >  --   --  11.3*  --  10.0 10.1 8.2  LATICACIDVEN 2.5*  --  3.0* 3.2*  --   --  1.7  --   --    < > = values in this interval not displayed.    Liver Function Tests: Recent Labs  Lab 06/06/18 2051 06/08/18 0309 06/08/18 1006  AST 60*  --  30  ALT 22  --  15  ALKPHOS 67  --  57  BILITOT 1.9*  --  0.6  PROT 6.7  --  5.6*  ALBUMIN 4.1 2.9* 2.9*   Recent Labs  Lab 06/06/18 2051  LIPASE 22   No results for input(s): AMMONIA in the last 168 hours.  ABG    Component Value Date/Time   PHART 7.394 06/07/2018 1108   PCO2ART 37.8 06/07/2018 1108   PO2ART 170.0 (H) 06/07/2018 1108   HCO3 23.1 06/07/2018 1108   TCO2 24 06/07/2018 1108   ACIDBASEDEF 2.0 06/07/2018 1108   O2SAT 100.0 06/07/2018 1108     Coagulation Profile: Recent Labs  Lab 06/06/18 2051  INR 1.1    Cardiac Enzymes: Recent Labs  Lab 06/06/18 2306  06/07/18 2114 06/08/18 0309 06/09/18 0331 06/09/18 0656 06/09/18 0819 06/09/18 1413 06/09/18 1654 06/10/18 0421  CKTOTAL 2,395*  --   --  768*  --   --  439*  --   --  414*  CKMB  --   --   --   --   --   --  3.0  --   --   --   TROPONINI  --    < > 1.43*  --  2.50* 2.07*  --  2.02* 1.77*  --    < > = values in this interval not displayed.    HbA1C: Hgb A1c MFr Bld  Date/Time Value Ref Range Status  06/07/2018 06:28 AM 5.5 4.8 -  5.6 % Final    Comment:    (NOTE) Pre diabetes:          5.7%-6.4% Diabetes:              >6.4% Glycemic control for   <7.0% adults with diabetes   02/09/2018 10:10 AM 5.6 4.6 - 6.5 % Final    Comment:    Glycemic Control Guidelines for People with Diabetes:Non Diabetic:  <6%Goal of Therapy: <7%Additional Action Suggested:  >8%     CBG: Recent Labs  Lab 06/10/18 1124 06/10/18 1517 06/10/18 1937 06/10/18 2323 06/11/18 0325  GLUCAP 88 94 96 97 106*    Review of Systems:   Unable  Past Medical History  He,  has a past medical history of Alcohol abuse, Arthritis, Atrial fibrillation  ([place]), Gastric ulcer, Gout, History of digital clubbing, Hyperkalemia (04/2017), Hyperlipidemia (07/2016), Hypertension, Nonischemic cardiomyopathy ([place]), Nonrheumatic mitral valve regurgitation, Osteoarthritis of right hip, Tobacco abuse, and Type II diabetes mellitus ([place]) (07/2016).   Surgical History    Past Surgical History:  Procedure Laterality Date  . CARDIOVASCULAR STRESS TEST    . CARDIOVERSION     DC  . TONSILLECTOMY       Social History   reports that he quit smoking about 5 years ago. His smoking use included cigarettes. He has a 5.00 pack-year smoking history. He has never used smokeless tobacco. He reports current alcohol use. He reports that he does not use drugs.   Family History   His family history includes Alcohol abuse in his father; Congestive Heart Failure in his mother; Diabetes in his mother; Heart disease in his paternal grandfather; Hypertension in his father; Stroke in his father.   Allergies No Known Allergies   Home Medications  Prior to Admission medications   Medication Sig Start Date End Date Taking? Authorizing Provider  allopurinol (ZYLOPRIM) 100 MG tablet 2 tabs po qd 03/14/18   McGowen, Adrian Blackwater, MD  digoxin (LANOXIN) 0.125 MG tablet Take 1 tablet by mouth daily. 07/25/16   [provider]  glucose blood (CONTOUR NEXT TEST) test strip Use to check blood sugar once daily. 10/07/16   McGowen, Adrian Blackwater, MD  lisinopril (PRINIVIL,ZESTRIL) 40 MG tablet Take 1 tablet by mouth daily. 07/25/16   [provider]  metFORMIN (GLUCOPHAGE) 500 MG tablet TAKE 1 TABLET AT BEDTIME 08/14/17   McGowen, Adrian Blackwater, MD  metoprolol succinate (TOPROL-XL) 100 MG 24 hr tablet Take 1 tablet by mouth 2 (two) times daily. 07/25/16   [provider]  MICROLET LANCETS MISC Use to check blood sugar once daily 11/15/16   McGowen, Adrian Blackwater, MD  pantoprazole (PROTONIX) 40 MG tablet Take 40 mg by mouth 2 (two) times daily. 08/01/16   [provider]   predniSONE (DELTASONE) 20 MG tablet 2 tabs po qd x 5d, then 1 tab po qd x 5d 03/14/18   McGowen, Adrian Blackwater, MD     Critical care time:    Ongoing critical care needs include CVA, aspiration pneumonitis, NSTEMI, ICU delirium requiring titratable medications  32 minutes critical care time not including any separately billable procedures  Erskine Emery MD PCCM [phone]

## 2018-06-11 NOTE — Procedures (Signed)
Cortrak  Person Inserting Tube:  Robert [name], RD Tube Type:  Cortrak - 43 inches Tube Location:  Right nare Initial Placement:  Stomach Secured by: Bridle Technique Used to Measure Tube Placement:  Documented cm marking at nare/ corner of mouth Cortrak Secured At:  68 cm Procedure Comments:  Cortrak Tube Team Note:  Consult received to place a Cortrak feeding tube.   No x-ray is required. RN may begin using tube.   If the tube becomes dislodged please keep the tube and contact the Cortrak team at [email or web address] (password [email or web address]) for replacement.  If after hours and replacement cannot be delayed, place a NG tube and confirm placement with an abdominal x-ray.   Kerman Passey MS, RD, [place], [place] [phone] Pager  [phone] Weekend/On-Call Pager

## 2018-06-11 NOTE — Progress Notes (Signed)
  Speech Language Pathology Treatment: Dysphagia  Patient Details Name: Robert [name] MRN: [id number] DOB: 1960/11/17 Today's Date: 06/11/2018 Time: 4680-3212 SLP Time Calculation (min) (ACUTE ONLY): 15 min  Assessment / Plan / Recommendation Clinical Impression  Pt seen for appropriateness for instrumental evaluation of swallow. He is unable to manage secretions evidenced by significantly wet vocal quality. Coughs appears fairly strong however wet vocal quality quickly returns. He refused trials of ice chip/water or applesauce stating he was "sick on my stomach" and can't drink water because I "strangle." SLP provided oral care. Dietitian arrived to place Cortrak. Will continue to follow to perform MBS when appropriate and pt agrees.    HPI HPI: pt is a 58 yo male adm to [facility] with large right MCA territory CVA, PMH + for ETOH use, HTN, DM2, Gi bleed, and pt developed aspiration pna and was intubated on 5/25-5/30/2020.  Swallow eval ordered after pt extubated 5/30 at 0930.  CXR showed right basilar ATX, improving edema.        SLP Plan  Continue with current plan of care       Recommendations  Diet recommendations: NPO Medication Administration: Via alternative means                Oral Care Recommendations: Oral care QID Follow up Recommendations: Skilled Nursing facility SLP Visit Diagnosis: Dysphagia, oropharyngeal phase (R13.12) Plan: Continue with current plan of care                       Houston Siren 06/11/2018, 10:59 AM  Orbie Pyo Colvin Caroli.Ed Risk analyst [phone] Office [phone]

## 2018-06-11 NOTE — Progress Notes (Signed)
STROKE TEAM PROGRESS NOTE   SUBJECTIVE (INTERVAL HISTORY) Patient lying in bed, drowsy sleepy, but easily arousable, on Precedex.  Agitated yesterday afternoon, received Ativan.  Cardiology on board, agree with anticoagulation once appropriate.  OBJECTIVE Vitals:   06/11/18 1730 06/11/18 1800 06/11/18 1830 06/11/18 1900  BP: (!) 108/91 132/89 134/78 124/79  Pulse: 91 71 78 76  Resp: (!) 24 18 16 17   Temp:      TempSrc:      SpO2: 100% 100% 100% 100%  Weight:      Height:        CBC:  Recent Labs  Lab 06/06/18 2051  06/10/18 0421 06/11/18 0554  WBC 15.9*   < > 10.1 8.2  NEUTROABS 13.1*  --   --   --   HGB 14.2   < > 12.3* 12.5*  HCT 43.6   < > 37.7* 39.1  MCV 93.2   < > 93.8 94.2  PLT 240   < > 200 198   < > = values in this interval not displayed.    Basic Metabolic Panel:  Recent Labs  Lab 06/10/18 0421 06/11/18 0554 06/11/18 1647  NA 143 142  --   K 3.2* 4.0  --   CL 102 104  --   CO2 28 23  --   GLUCOSE 137* 115*  --   BUN 9 12  --   CREATININE 0.82 0.90  --   CALCIUM 8.4* 8.5*  --   MG 1.7 1.9  --   PHOS 4.3 3.0 3.4    Lipid Panel:     Component Value Date/Time   CHOL 198 06/07/2018 0628   TRIG 72 06/07/2018 0628   HDL 81 06/07/2018 0628   CHOLHDL 2.4 06/07/2018 0628   VLDL 14 06/07/2018 0628   LDLCALC 103 (H) 06/07/2018 0628   HgbA1c:  Lab Results  Component Value Date   HGBA1C 5.5 06/07/2018   Urine Drug Screen:     Component Value Date/Time   LABOPIA NONE DETECTED 06/06/2018 2013   COCAINSCRNUR NONE DETECTED 06/06/2018 2013   LABBENZ NONE DETECTED 06/06/2018 2013   AMPHETMU NONE DETECTED 06/06/2018 2013   THCU NONE DETECTED 06/06/2018 2013   LABBARB NONE DETECTED 06/06/2018 2013    Alcohol Level     Component Value Date/Time   ETH <10 06/06/2018 2052    IMAGING  Ct Angio Head W Or Wo Contrast Ct Angio Neck W Or Wo Contrast 06/08/2018 IMPRESSION:   CT HEAD  1. Continued interval evolution of large right MCA territory  infarct, stable in size and distribution from previous. Associated mild regional mass effect with trace 2 mm right-to-left shift. Probable associated petechial hemorrhage without evidence for hemorrhagic transformation.  2. No other new acute intracranial abnormality.  3. Underlying atrophy with moderate chronic small vessel ischemic disease, with additional multiple chronic infarcts as above.   CTA HEAD AND NECK  1. Negative CTA for acute large vessel occlusion.  2. 70% atheromatous stenosis at the proximal right ICA. Left carotid artery system widely patent within the neck.  3. Occlusion of the right vertebral artery at its origin. Dominant left vertebral artery widely patent within the neck. Moderate proximal left V4 stenosis.  4. Moderate to large layering bilateral pleural effusions with associated atelectasis and/or consolidation.    Ct Angio Chest Pe W Or Wo Contrast 06/07/2018 IMPRESSION:  1. No PE identified on today's exam. The main pulmonary artery is dilated which can be seen in patients with  elevated PA pressures.  2. Extensive bilateral multifocal consolidation as detailed above concerning for multifocal pneumonia or aspiration. Some debris is noted within the trachea.  3. The endotracheal tube terminates somewhat high at the level of the thoracic inlet. Repositioning should be considered.  4. Small to moderate-sized bilateral pleural effusions.  5. Enteric tube terminates in the stomach.  6. Cardiomegaly. There is reflux of contrast in the IVC consistent with some degree of underlying cardiac dysfunction. Aortic Atherosclerosis (ICD10-I70.0).   Dg Chest Port 1 View 06/09/2018 IMPRESSION:  1. Well-positioned support structures.  2. Decreased right perihilar opacity, suggesting improving pulmonary edema or improving atelectasis.  3. Stable small right pleural effusion.  4. Similar patchy left retrocardiac opacity.   Dg Abd Portable 1v 06/07/2018 IMPRESSION:  Orogastric tube  with the tip projecting over the stomach.    Transthoracic Echocardiogram  Reduced ejection fraction of 35 to 40%.  No wall motion abnormalities.  EKG - Afib RVR  EEG  IMPRESSION:  This recording shows moderate global slowing indicating a moderate global encephalopathy.  However, no epileptiform discharges are observed.   PHYSICAL EXAM  Temp:  [97.9 F (36.6 C)-99.9 F (37.7 C)] 99.9 F (37.7 C) (06/01 1600) Pulse Rate:  [25-107] 76 (06/01 1900) Resp:  [16-27] 17 (06/01 1900) BP: (108-155)/(71-115) 124/79 (06/01 1900) SpO2:  [92 %-100 %] 100 % (06/01 1900) Weight:  [86.6 kg] 86.6 kg (06/01 0500)  General - Well nourished, well developed, in no apparent distress  Ophthalmologic - fundi not visualized due to noncooperation.  Cardiovascular - irregularly irregular heart rate and rhythm.  Neuro - drowsy sleepy, however easily arousable. He follows simple commands. Severe dysarthria but able to name and repeat simple sentence. Paucity of speech. No gaze deviation. PERRL, EOMI, Visual field not cooperated on exam, however blinking to visual threat bilaterally. left facial droop, tongue protrusion to the left, BUE 3+/5. BLE distal 5/5, proximal left LE 4/5 and RLE 3/5 due to chronic right hip arthritis. Left lower facial weakness. Deep tendon reflexes are symmetric.  Plantars downgoing. Sensation symmetrical. FTN not cooperative. Gait not tested.    ASSESSMENT/PLAN Mr. Dmoni Fortson is a 58 y.o. male with history of Etoh Abuse, Afib not on [place] d/t GIB, DM, peptic ulcer, hypertension, hyperlipidemia, gout who presented with AMS and dysarthria. Found to have large subacute RMCA infarct.  Stroke: R LARGE MCA infarct due to A. fib not on AC vs. right ICA high-grade stenosis  Resultant severe dysarthria  CT head R LARGE MCA stroke  MRI head - not able to performed due to metal in skull  CTA H&N - 70% stenosis proximal right ICA.  Occlusion of the right vertebral artery at its origin.    2D Echo - EF 35 to 40%.  No wall motion abnormalities.  EEG - moderate global slowing indicating a moderate global encephalopathy  [employer] Virus 2  - negative  LDL - 103  HgbA1c - 5.5  UDS - negative  VTE prophylaxis - lovenox  None prior to admission, now on ASA 325mg .  Will consider DOAC in 7 to 10 days post stroke to avoid hemorrhagic conversion.  Ongoing aggressive stroke risk factor management  Therapy recommendations:  pending  Disposition:  Pending  Chronic A. fib not on AC  Status post cardioversion 2015  Off Xarelto due to GI bleeding and anemia in 2015  Patient also declined watchman device   On digoxin and IV metoprolol 5 mg Q6 hrs as well as prn.  Discussed  with cardiology Dr. Debara Pickett, he agrees with Eliquis.  However, need to discuss with patient and family once appropriate  Respiratory failure  Intubated for hypoxia on the floor  CTA chest no PE but bilateral pneumonia and pleural effusion ->   CXR Increasing bibasilar hazy opacity  Extubated 06/09/18  On Unasyn started Thursday 5/28  Still has copious secretions  On 3% neb  NT suctioning  AKI  Creatinine 1.00-1.57-0.82->0.90  On IV fluid  Continue monitoring  Elevated troponin  Troponin I 0.43-2.50-2.07-2.02-1.77  Likely demand ischemia  Cardiology consult - Troponins "may represent demand in setting of hypotension, rhabdomyolysis, afib with  RVR"  Leukocytosis  WBC 15.9-19.2-11.3->10.1-> 8.2  Afebrile  Aspiration PNA - IV Unasyn started Thursday 06/07/18  Continue monitoring  NT suctioning  Hypertension  Hypotension much improved . Pressors as needed with IVF . Long-term BP goal normotensive  Hyperlipidemia  Lipid lowering medication PTA:  none  LDL 103, goal < 70  Current lipid lowering medication:Lipitor 40mg  once po access  Continue statin at discharge  Tobacco abuse  Current smoker  Smoking cessation counseling provided  Pt is willing to  quit  Agitation  Treated with Ativan 5/31  Now on Precedex  Close monitoring  On mittens as early  Other Stroke Risk Factors  ETOH use; advise when able to quit - on FA/MVI/B1  Evidence of prior stroke seen on CT  Other Active Problems  Hypokalemia - 2.9 ->3.2 -> 4.0  Hospital day # 5  This patient is critically ill and at significant risk of neurological worsening, death and care requires constant monitoring of vital signs, hemodynamics,respiratory and cardiac monitoring, extensive review of multiple databases, frequent neurological assessment, discussion with family, other specialists and medical decision making of high complexity. I spent 35 minutes of neurocritical care time  in the care of  this patient.  Rosalin Hawking, MD PhD Stroke Neurology 06/11/2018 7:13 PM   To contact Stroke Continuity provider, please refer to [email or web address] After hours, contact General Neurology

## 2018-06-11 NOTE — Progress Notes (Signed)
Occupational Therapy Treatment Patient Details Name: Robert [name] MRN: [id number] DOB: June 14, 1960 Today's Date: 06/11/2018    History of present illness pt is a 58 yo male adm to [facility] with large right MCA territory CVA, PMH + for ETOH use, HTN, DM2, Gi bleed, and pt developed aspiration pna and was intubated on 5/25-5/30/2020.   OT comments  Pt seen in conjunction with PT.  He required mod encouragement to participate.  He initially insists he just needs to sleep so his family can pick hip up tomorrow and that he is at a hospital hotel.   He was transitioned into chair position in the bed, and worked on moving into symmetric unsupported sitting.  He was able to progress from max A to periods of min guard assist with bil. UE support.  Pt much less fearful, but fatigues quickly.   Continue to recommend SNF.   Follow Up Recommendations  SNF;Supervision/Assistance - 24 hour    Equipment Recommendations  None recommended by OT    Recommendations for Other Services      Precautions / Restrictions Precautions Precautions: Fall Precaution Comments: NG tube Restrictions Weight Bearing Restrictions: No       Mobility Bed Mobility Overal bed mobility: Needs Assistance Bed Mobility: Supine to Sit Rolling: Mod assist   Supine to sit: Max assist;+2 for physical assistance     General bed mobility comments: Bed placed in egress chair position; worked on bringing trunk forward off bed with assist of 2 progressing to pt using UEs on rails to assist bringing trunk forward. Helped decrease fear of falling. Rolling to right/left to change wet bed pad.   Transfers                 General transfer comment: unable to safely attempt     Balance Overall balance assessment: Needs assistance Sitting-balance support: Feet unsupported;Bilateral upper extremity supported Sitting balance-Leahy Scale: Poor Sitting balance - Comments: Requires assist of 2 initially for static sitting with trunk  lifted off bed (in chair position); able to pull self up to sitting without back support using rails for short period and perform cervical AROM and BLE there ex.  Postural control: Posterior lean                                 ADL either performed or assessed with clinical judgement   ADL Overall ADL's : Needs assistance/impaired Eating/Feeding: NPO   Grooming: Wash/dry hands;Oral care;Wash/dry face;Maximal assistance;Sitting                                       Vision   Additional Comments: Pt will turn more spontaneously to locate people on the Lt    Perception     Praxis      Cognition Arousal/Alertness: Awake/alert Behavior During Therapy: Anxious Overall Cognitive Status: Impaired/Different from baseline Area of Impairment: Orientation;Attention;Awareness;Safety/judgement;Problem solving;Following commands                 Orientation Level: Disoriented to;Time;Situation Current Attention Level: Sustained Memory: Decreased short-term memory Following Commands: Follows one step commands with increased time Safety/Judgement: Decreased awareness of deficits;Decreased awareness of safety Awareness: Intellectual Problem Solving: Requires verbal cues;Requires tactile cues;Slow processing General Comments: Easily self distracted; able to be redirected. Goes on tangents. Difficult to understand at times due to dysarthria.  Exercises General Exercises - Lower Extremity Long Arc Quad: AROM;10 reps;Seated   Shoulder Instructions       General Comments VSS throughout     Pertinent Vitals/ Pain       Pain Assessment: Faces Faces Pain Scale: Hurts little more Pain Location: Rt hip with movement Pain Descriptors / Indicators: Discomfort Pain Intervention(s): Monitored during session  Home Living                                          Prior Functioning/Environment              Frequency  Min 2X/week         Progress Toward Goals  OT Goals(current goals can now be found in the care plan section)  Progress towards OT goals: Progressing toward goals     Plan Discharge plan remains appropriate    Co-evaluation    PT/OT/SLP Co-Evaluation/Treatment: Yes Reason for Co-Treatment: Necessary to address cognition/behavior during functional activity;For patient/therapist safety;To address functional/ADL transfers PT goals addressed during session: Mobility/safety with mobility;Balance;Strengthening/ROM OT goals addressed during session: Strengthening/ROM;ADL's and self-care      AM-PAC OT "6 Clicks" Daily Activity     Outcome Measure   Help from another person eating meals?: Total Help from another person taking care of personal grooming?: Total Help from another person toileting, which includes using toliet, bedpan, or urinal?: Total Help from another person bathing (including washing, rinsing, drying)?: Total Help from another person to put on and taking off regular upper body clothing?: Total Help from another person to put on and taking off regular lower body clothing?: Total 6 Click Score: 6    End of Session Equipment Utilized During Treatment: Oxygen  OT Visit Diagnosis: Unsteadiness on feet (R26.81);Cognitive communication deficit (R41.841) Symptoms and signs involving cognitive functions: Cerebral infarction   Activity Tolerance Patient limited by fatigue;Other (comment)(anxiety )   Patient Left in bed;with call bell/phone within reach;with bed alarm set   Nurse Communication Mobility status        Time: 6761-9509 OT Time Calculation (min): 42 min  Charges: OT General Charges $OT Visit: 1 Visit OT Treatments $Neuromuscular Re-education: 8-22 mins  Lucille Passy, OTR/L [place] Pager [phone] Office [phone]    Lucille Passy M 06/11/2018, 6:48 PM

## 2018-06-11 NOTE — Progress Notes (Signed)
DAILY PROGRESS NOTE   Patient Name: Robert [name] Date of Encounter: 06/11/2018 Cardiologist: No primary care provider on file.  Chief Complaint   No pain  Patient Profile   58 yo male with etoh abuse disorder, non-complaince with anticoagulation, persistent afib and large stroke, also followed by [facility] cardiology for cardiomyopathy with LVEF 30-35%  Subjective   No pain, somewhat agitated on precedex. BP elevated. Afib is rate controlled.  Objective   Vitals:   06/11/18 0600 06/11/18 0700 06/11/18 0800 06/11/18 0822  BP: (!) 148/102 (!) 142/97    Pulse: (!) 43 (!) 39    Resp: 19 (!) 22    Temp:   98 F (36.7 C)   TempSrc:   Oral   SpO2: 95% 98%  100%  Weight:      Height:        Intake/Output Summary (Last 24 hours) at 06/11/2018 0951 Last data filed at 06/11/2018 0600 Gross per 24 hour  Intake 1417.3 ml  Output 400 ml  Net 1017.3 ml   Filed Weights   06/06/18 2008 06/10/18 0500 06/11/18 0500  Weight: 84.8 kg 84.4 kg 86.6 kg    Physical Exam   General appearance: alert and combative Lungs: clear to auscultation bilaterally Heart: irregularly irregular rhythm Extremities: extremities normal, atraumatic, no cyanosis or edema Neurologic: Mental status: confused, mildly agitated  Inpatient Medications    Scheduled Meds: . aspirin  325 mg Per Tube Daily   Or  . aspirin  300 mg Rectal Daily  . atorvastatin  40 mg Per Tube q1800  . chlorhexidine  15 mL Mouth Rinse BID  . Chlorhexidine Gluconate Cloth  6 each Topical Daily  . digoxin  0.125 mg Intravenous Daily  . folic acid  1 mg Per Tube Daily  . heparin  5,000 Units Subcutaneous Q8H  . mouth rinse  15 mL Mouth Rinse q12n4p  . metoprolol tartrate  5 mg Intravenous Q6H  . multivitamin with minerals  1 tablet Per Tube Daily  . pantoprazole (PROTONIX) IV  40 mg Intravenous QHS  . sodium chloride HYPERTONIC  4 mL Nebulization QID  . thiamine  100 mg Intravenous Daily    Continuous Infusions: . sodium  chloride Stopped (06/10/18 1526)  . sodium chloride    . ampicillin-sulbactam (UNASYN) IV 3 g (06/11/18 0937)  . dexmedetomidine (PRECEDEX) IV infusion 0.7 mcg/kg/hr (06/11/18 0405)  . lactated ringers 10 mL/hr at 06/11/18 0400  . magnesium sulfate bolus IVPB      PRN Meds: sodium chloride, acetaminophen **OR** acetaminophen (TYLENOL) oral liquid 160 mg/5 mL **OR** acetaminophen, bisacodyl, chlordiazePOXIDE, cloNIDine, docusate, fentaNYL (SUBLIMAZE) injection, ipratropium-albuterol, senna-docusate   Labs   Results for orders placed or performed during the hospital encounter of 06/06/18 (from the past 48 hour(s))  Glucose, capillary     Status: None   Collection Time: 06/09/18 12:13 PM  Result Value Ref Range   Glucose-Capillary 97 70 - 99 mg/dL   Comment 1 Notify RN    Comment 2 Document in Chart   Troponin I - Now Then Q6H     Status: Abnormal   Collection Time: 06/09/18  2:13 PM  Result Value Ref Range   Troponin I 2.02 (HH) <0.03 ng/mL    Comment: CRITICAL VALUE NOTED.  VALUE IS CONSISTENT WITH PREVIOUSLY REPORTED AND CALLED VALUE. Performed at [facility], [place] [address] [place], [place] [address]   Glucose, capillary     Status: Abnormal   Collection Time: 06/09/18  3:22  PM  Result Value Ref Range   Glucose-Capillary 109 (H) 70 - 99 mg/dL   Comment 1 Notify RN    Comment 2 Document in Chart   Phosphorus     Status: None   Collection Time: 06/09/18  4:54 PM  Result Value Ref Range   Phosphorus 4.2 2.5 - 4.6 mg/dL    Comment: Performed at [facility], [place] [address] [place], [place] [address]  Troponin I - Now Then Q6H     Status: Abnormal   Collection Time: 06/09/18  4:54 PM  Result Value Ref Range   Troponin I 1.77 (HH) <0.03 ng/mL    Comment: CRITICAL VALUE NOTED.  VALUE IS CONSISTENT WITH PREVIOUSLY REPORTED AND CALLED VALUE. Performed at [facility], [place] [address] [place], [place] [address]   Glucose, capillary     Status: None    Collection Time: 06/09/18  8:26 PM  Result Value Ref Range   Glucose-Capillary 88 70 - 99 mg/dL  Glucose, capillary     Status: Abnormal   Collection Time: 06/09/18 11:55 PM  Result Value Ref Range   Glucose-Capillary 110 (H) 70 - 99 mg/dL  Glucose, capillary     Status: Abnormal   Collection Time: 06/10/18  3:40 AM  Result Value Ref Range   Glucose-Capillary 123 (H) 70 - 99 mg/dL  Digoxin level     Status: Abnormal   Collection Time: 06/10/18  4:21 AM  Result Value Ref Range   Digoxin Level 0.2 (L) 0.8 - 2.0 ng/mL    Comment: Performed at [facility], [place] [address], [place], [place] [address]  Basic metabolic panel     Status: Abnormal   Collection Time: 06/10/18  4:21 AM  Result Value Ref Range   Sodium 143 135 - 145 mmol/L   Potassium 3.2 (L) 3.5 - 5.1 mmol/L   Chloride 102 98 - 111 mmol/L   CO2 28 22 - 32 mmol/L   Glucose, Bld 137 (H) 70 - 99 mg/dL   BUN 9 6 - 20 mg/dL   Creatinine, Ser 0.82 0.61 - 1.24 mg/dL   Calcium 8.4 (L) 8.9 - 10.3 mg/dL   GFR calc non Af Amer >60 >60 mL/min   GFR calc Af Amer >60 >60 mL/min   Anion gap 13 5 - 15    Comment: Performed at [facility], [place] [address], [place], [place] [address]  Magnesium     Status: None   Collection Time: 06/10/18  4:21 AM  Result Value Ref Range   Magnesium 1.7 1.7 - 2.4 mg/dL    Comment: Performed at [place] [address], [place], [place] [address]  Phosphorus     Status: None   Collection Time: 06/10/18  4:21 AM  Result Value Ref Range   Phosphorus 4.3 2.5 - 4.6 mg/dL    Comment: Performed at [place] [address] [place], [place] [address]  CBC     Status: Abnormal   Collection Time: 06/10/18  4:21 AM  Result Value Ref Range   WBC 10.1 4.0 - 10.5 K/uL   RBC 4.02 (L) 4.22 - 5.81 MIL/uL   Hemoglobin 12.3 (L) 13.0 - 17.0 g/dL   HCT 37.7 (L) 39.0 - 52.0 %   MCV 93.8 80.0 - 100.0 fL   MCH 30.6 26.0 - 34.0 pg   MCHC 32.6 30.0 - 36.0 g/dL   RDW 13.4 11.5 - 15.5 %  Platelets 200 150 - 400 K/uL   nRBC 0.0 0.0 - 0.2 %    Comment: Performed at [facility], [place] [address], [place], [place] [address]  CK     Status: Abnormal   Collection Time: 06/10/18  4:21 AM  Result Value Ref Range   Total CK 414 (H) 49 - 397 U/L    Comment: Performed at [facility], [place] [address], [place], [place] [address]  Glucose, capillary     Status: None   Collection Time: 06/10/18  8:17 AM  Result Value Ref Range   Glucose-Capillary 96 70 - 99 mg/dL   Comment 1 Notify RN    Comment 2 Document in Chart   Glucose, capillary     Status: None   Collection Time: 06/10/18 11:24 AM  Result Value Ref Range   Glucose-Capillary 88 70 - 99 mg/dL   Comment 1 Notify RN    Comment 2 Document in Chart   Glucose, capillary     Status: None   Collection Time: 06/10/18  3:17 PM  Result Value Ref Range   Glucose-Capillary 94 70 - 99 mg/dL   Comment 1 Notify RN    Comment 2 Document in Chart   Glucose, capillary     Status: None   Collection Time: 06/10/18  7:37 PM  Result Value Ref Range   Glucose-Capillary 96 70 - 99 mg/dL  Glucose, capillary     Status: None   Collection Time: 06/10/18 11:23 PM  Result Value Ref Range   Glucose-Capillary 97 70 - 99 mg/dL  Glucose, capillary     Status: Abnormal   Collection Time: 06/11/18  3:25 AM  Result Value Ref Range   Glucose-Capillary 106 (H) 70 - 99 mg/dL  Digoxin level     Status: Abnormal   Collection Time: 06/11/18  5:54 AM  Result Value Ref Range   Digoxin Level 0.6 (L) 0.8 - 2.0 ng/mL    Comment: Performed at [facility], [place]. [address] [place], [place] [address]  Basic metabolic panel     Status: Abnormal   Collection Time: 06/11/18  5:54 AM  Result Value Ref Range   Sodium 142 135 - 145 mmol/L   Potassium 4.0 3.5 - 5.1 mmol/L    Comment: DELTA CHECK NOTED   Chloride 104 98 - 111 mmol/L   CO2 23 22 - 32 mmol/L   Glucose, Bld 115 (H) 70 - 99 mg/dL   BUN 12 6 - 20 mg/dL   Creatinine, Ser 0.90 0.61 -  1.24 mg/dL   Calcium 8.5 (L) 8.9 - 10.3 mg/dL   GFR calc non Af Amer >60 >60 mL/min   GFR calc Af Amer >60 >60 mL/min   Anion gap 15 5 - 15    Comment: Performed at [facility], [place] [address], [place], [place] [address]  Magnesium     Status: None   Collection Time: 06/11/18  5:54 AM  Result Value Ref Range   Magnesium 1.9 1.7 - 2.4 mg/dL    Comment: Performed at [place] [address] [place], [place] [address]  Phosphorus     Status: None   Collection Time: 06/11/18  5:54 AM  Result Value Ref Range   Phosphorus 3.0 2.5 - 4.6 mg/dL    Comment: Performed at [place] [address] [place],  [address]  CBC     Status: Abnormal   Collection Time: 06/11/18  5:54 AM  Result Value Ref Range   WBC 8.2 4.0 - 10.5 K/uL   RBC 4.15 (L) 4.22 - 5.81 MIL/uL   Hemoglobin 12.5 (L) 13.0 - 17.0 g/dL   HCT 39.1 39.0 - 52.0 %   MCV 94.2 80.0 - 100.0 fL   MCH 30.1 26.0 - 34.0 pg   MCHC 32.0 30.0 - 36.0 g/dL   RDW 13.2 11.5 - 15.5 %   Platelets 198 150 - 400 K/uL   nRBC 0.0 0.0 - 0.2 %    Comment: Performed at [facility], [place] [address], [place], [place] [address]  Glucose, capillary     Status: None   Collection Time: 06/11/18  8:22 AM  Result Value Ref Range   Glucose-Capillary 96 70 - 99 mg/dL   Comment 1 Notify RN    Comment 2 Document in Chart     ECG   N/A  Telemetry   Afib with CVR - Personally Reviewed  Radiology    Dg Chest Port 1 View  Result Date: 06/10/2018 CLINICAL DATA:  Pneumonia EXAM: PORTABLE CHEST 1 VIEW COMPARISON:  06/09/2018 FINDINGS: Endotracheal and NG tubes removed. Normal heart size. Bibasilar hazy airspace opacity has increased. Upper lungs remain clear. No pneumothorax. IMPRESSION: Increasing bibasilar hazy opacity likely due to worsening airspace disease. Pleural effusions are not excluded. Electronically Signed   By: Marybelle Killings M.D.   On: 06/10/2018 09:01    Cardiac Studies   N/A  Assessment   1.  Principal Problem: 2.   Stroke ([place]) 3. Active Problems: 4.   Type II diabetes mellitus ([place]) 5.   A-fib ([place]) 6.   Hyperlipidemia 7.   Hypertension 8.   Alcohol abuse 9.   Endotracheal tube present 10.   Altered mental status 11.   Plan   1. Mr. Deutschman remains agitated on precedex and hopefully can be weaned. Has a NICM - appears compensated although he is +4L since admit - weight is up 2KG. Give lasix 40 mg IV x 1 today. - on aspirin 325 mg daily by neuro and clonidine for BP. Consider restarting low dose toprol XL 25 mg daily (was on 100 mg BID at home) when able to take po reliably.   Time Spent Directly with Patient:  I have spent a total of 35 minutes with the patient reviewing hospital notes, telemetry, EKGs, labs and examining the patient as well as establishing an assessment and plan that was discussed personally with the patient.  > 50% of time was spent in direct patient care.  Length of Stay:  LOS: 5 days   Pixie Casino, MD, [facility], [place] Director of the Advanced Lipid Disorders &  Cardiovascular Risk Reduction Clinic Diplomate of the American Board of Clinical Lipidology Attending Cardiologist  Direct Dial: [phone]  Fax: [phone]  Website:  [email or web address]  06/11/2018, 9:51 AM

## 2018-06-11 NOTE — Progress Notes (Signed)
Physical Therapy Treatment Patient Details Name: Robert [name] MRN: [id number] DOB: September 25, 1960 Today's Date: 06/11/2018    History of Present Illness pt is a 58 yo male adm to [facility] with large right MCA territory CVA, PMH + for ETOH use, HTN, DM2, Gi bleed, and pt developed aspiration pna and was intubated on 5/25-5/30/2020.    PT Comments    Patient progressing slowly towards PT goals. Tolerated placing bed in egress chair position and worked on sitting without back support with assist of 2 initially progressing to moments of Min guard with pt using UEs for support. Pt less fearful of falling working in this position. No pushing tendencies noted today however pt offloading right hip due to arthritis and pain. Pt easily self distracted but also able to be redirected. Continues to be confused but better able to have a conversation today relating to PLOF. Still with significant cognitive deficits. Will continue to follow and progress as tolerated.    Follow Up Recommendations  SNF     Equipment Recommendations  Other (comment)(TBA)    Recommendations for Other Services       Precautions / Restrictions Precautions Precautions: Fall Precaution Comments: NG tube Restrictions Weight Bearing Restrictions: No    Mobility  Bed Mobility Overal bed mobility: Needs Assistance Bed Mobility: Supine to Sit Rolling: Mod assist   Supine to sit: Max assist;+2 for physical assistance     General bed mobility comments: Bed placed in egress chair position; worked on bringing trunk forward off bed with assist of 2 progressing to pt using UEs on rails to assist bringing trunk forward. Helped decrease fear of falling. Rolling to right/left to change wet bed pad.   Transfers                 General transfer comment: unable to safely attempt   Ambulation/Gait                 Stairs             Wheelchair Mobility    Modified Rankin (Stroke Patients Only) Modified Rankin  (Stroke Patients Only) Pre-Morbid Rankin Score: Slight disability Modified Rankin: Moderately severe disability     Balance Overall balance assessment: Needs assistance Sitting-balance support: Feet unsupported;Bilateral upper extremity supported Sitting balance-Leahy Scale: Poor Sitting balance - Comments: Requires assist of 2 initially for static sitting with trunk lifted off bed (in chair position); able to pull self up to sitting without back support using rails for short period and perform cervical AROM and BLE there ex.  Postural control: Posterior lean                                  Cognition Arousal/Alertness: Awake/alert Behavior During Therapy: Anxious Overall Cognitive Status: Impaired/Different from baseline Area of Impairment: Orientation;Attention;Awareness;Safety/judgement;Problem solving;Following commands                 Orientation Level: Disoriented to;Time;Situation(vaguely to situation) Current Attention Level: Sustained   Following Commands: Follows one step commands with increased time Safety/Judgement: Decreased awareness of deficits;Decreased awareness of safety Awareness: Intellectual Problem Solving: Requires verbal cues;Requires tactile cues;Slow processing General Comments: Easily self distracted; able to be redirected. Goes on tangents. Difficult to understand at times due to dysarthria.       Exercises General Exercises - Lower Extremity Long Arc Quad: AROM;10 reps;Seated    General Comments General comments (skin integrity, edema, etc.): VSS throughout.  Pertinent Vitals/Pain Pain Assessment: Faces Faces Pain Scale: Hurts little more Pain Location: Rt hip with movement Pain Descriptors / Indicators: Discomfort Pain Intervention(s): Monitored during session;Repositioned    Home Living                      Prior Function            PT Goals (current goals can now be found in the care plan section)  Progress towards PT goals: Progressing toward goals    Frequency    Min 2X/week      PT Plan Current plan remains appropriate    Co-evaluation PT/OT/SLP Co-Evaluation/Treatment: Yes Reason for Co-Treatment: Necessary to address cognition/behavior during functional activity;For patient/therapist safety;Complexity of the patient's impairments (multi-system involvement) PT goals addressed during session: Mobility/safety with mobility;Balance;Strengthening/ROM        AM-PAC PT "6 Clicks" Mobility   Outcome Measure  Help needed turning from your back to your side while in a flat bed without using bedrails?: A Lot Help needed moving from lying on your back to sitting on the side of a flat bed without using bedrails?: A Lot Help needed moving to and from a bed to a chair (including a wheelchair)?: Total Help needed standing up from a chair using your arms (e.g., wheelchair or bedside chair)?: Total Help needed to walk in hospital room?: Total Help needed climbing 3-5 steps with a railing? : Total 6 Click Score: 8    End of Session   Activity Tolerance: Patient tolerated treatment well Patient left: in bed;with call bell/phone within reach;with bed alarm set Nurse Communication: Mobility status;Other (comment)(condom cath came off) PT Visit Diagnosis: Unsteadiness on feet (R26.81);Other abnormalities of gait and mobility (R26.89);Other symptoms and signs involving the nervous system (R29.898)     Time: 8101-7510 PT Time Calculation (min) (ACUTE ONLY): 42 min  Charges:  $Therapeutic Activity: 8-22 mins                     Wray Kearns, PT, DPT Acute Rehabilitation Services Pager [phone] Office [place] 06/11/2018, 4:01 PM

## 2018-06-12 ENCOUNTER — Encounter: Payer: Self-pay | Admitting: Family Medicine

## 2018-06-12 DIAGNOSIS — I480 Paroxysmal atrial fibrillation: Secondary | ICD-10-CM

## 2018-06-12 DIAGNOSIS — M10071 Idiopathic gout, right ankle and foot: Secondary | ICD-10-CM

## 2018-06-12 DIAGNOSIS — Z8379 Family history of other diseases of the digestive system: Secondary | ICD-10-CM

## 2018-06-12 DIAGNOSIS — N179 Acute kidney failure, unspecified: Secondary | ICD-10-CM

## 2018-06-12 DIAGNOSIS — Z4659 Encounter for fitting and adjustment of other gastrointestinal appliance and device: Secondary | ICD-10-CM

## 2018-06-12 DIAGNOSIS — D72829 Elevated white blood cell count, unspecified: Secondary | ICD-10-CM

## 2018-06-12 DIAGNOSIS — R1312 Dysphagia, oropharyngeal phase: Secondary | ICD-10-CM

## 2018-06-12 LAB — BASIC METABOLIC PANEL
Anion gap: 11 (ref 5–15)
BUN: 15 mg/dL (ref 6–20)
CO2: 29 mmol/L (ref 22–32)
Calcium: 8.5 mg/dL — ABNORMAL LOW (ref 8.9–10.3)
Chloride: 99 mmol/L (ref 98–111)
Creatinine, Ser: 0.81 mg/dL (ref 0.61–1.24)
GFR calc Af Amer: >60 mL/min (ref >60)
GFR calc non Af Amer: >60 mL/min (ref >60)
Glucose, Bld: 185 mg/dL — ABNORMAL HIGH (ref 70–99)
Potassium: 3.1 mmol/L — ABNORMAL LOW (ref 3.5–5.1)
Sodium: 139 mmol/L (ref 135–145)

## 2018-06-12 LAB — CBC
HCT: 35.7 % — ABNORMAL LOW (ref 39.0–52.0)
Hemoglobin: 11.9 g/dL — ABNORMAL LOW (ref 13.0–17.0)
MCH: 30.3 pg (ref 26.0–34.0)
MCHC: 33.3 g/dL (ref 30.0–36.0)
MCV: 90.8 fL (ref 80.0–100.0)
Platelets: 232 10*3/uL (ref 150–400)
RBC: 3.93 MIL/uL — ABNORMAL LOW (ref 4.22–5.81)
RDW: 12.9 % (ref 11.5–15.5)
WBC: 11.2 10*3/uL — ABNORMAL HIGH (ref 4.0–10.5)
nRBC: 0 % (ref 0.0–0.2)

## 2018-06-12 LAB — GLUCOSE, CAPILLARY
Glucose-Capillary: 159 mg/dL — ABNORMAL HIGH (ref 70–99)
Glucose-Capillary: 125 mg/dL — ABNORMAL HIGH (ref 70–99)
Glucose-Capillary: 153 mg/dL — ABNORMAL HIGH (ref 70–99)
Glucose-Capillary: 161 mg/dL — ABNORMAL HIGH (ref 70–99)
Glucose-Capillary: 139 mg/dL — ABNORMAL HIGH (ref 70–99)

## 2018-06-12 LAB — MAGNESIUM
Magnesium: 1.8 mg/dL (ref 1.7–2.4)
Magnesium: 2.1 mg/dL (ref 1.7–2.4)

## 2018-06-12 LAB — PHOSPHORUS
Phosphorus: 3 mg/dL (ref 2.5–4.6)
Phosphorus: 2.5 mg/dL (ref 2.5–4.6)

## 2018-06-12 MED ORDER — QUETIAPINE FUMARATE 25 MG PO TABS
25.0000 mg | ORAL_TABLET | Freq: Two times a day (BID) | ORAL | Status: DC
  Filled 2018-06-12: qty 1

## 2018-06-12 MED ORDER — VITAMIN B-1 100 MG PO TABS
100.0000 mg | ORAL_TABLET | Freq: Every day | ORAL | Status: DC
  Filled 2018-06-12: qty 1

## 2018-06-12 MED ORDER — CHLORDIAZEPOXIDE HCL 5 MG PO CAPS
5.0000 mg | ORAL_CAPSULE | Freq: Three times a day (TID) | ORAL | Status: DC | PRN
  Administered 2018-06-12 – 2018-06-20 (×2): 5 mg
  Filled 2018-06-12: qty 1

## 2018-06-12 MED ORDER — PREDNISONE 20 MG PO TABS
20.0000 mg | ORAL_TABLET | Freq: Every day | ORAL | Status: AC
  Administered 2018-06-12 – 2018-06-13 (×2): 20 mg
  Filled 2018-06-12: qty 1

## 2018-06-12 MED ORDER — POTASSIUM CHLORIDE 20 MEQ/15ML (10%) PO SOLN: 40.0000 meq | ORAL | Status: DC

## 2018-06-12 MED ORDER — INSULIN ASPART 100 UNIT/ML ~~LOC~~ SOLN
0.0000 [IU] | SUBCUTANEOUS | Status: DC
  Administered 2018-06-12: 4 [IU] via SUBCUTANEOUS
  Administered 2018-06-12 (×2): 3 [IU] via SUBCUTANEOUS
  Administered 2018-06-12: 4 [IU] via SUBCUTANEOUS
  Administered 2018-06-13: 3 [IU] via SUBCUTANEOUS
  Administered 2018-06-13: 7 [IU] via SUBCUTANEOUS
  Administered 2018-06-13: 3 [IU] via SUBCUTANEOUS
  Administered 2018-06-13 (×2): 4 [IU] via SUBCUTANEOUS
  Administered 2018-06-14 (×2): 3 [IU] via SUBCUTANEOUS
  Administered 2018-06-14: 4 [IU] via SUBCUTANEOUS
  Administered 2018-06-15 (×4): 3 [IU] via SUBCUTANEOUS
  Administered 2018-06-15 – 2018-06-16 (×2): 4 [IU] via SUBCUTANEOUS
  Administered 2018-06-16: 3 [IU] via SUBCUTANEOUS

## 2018-06-12 MED ORDER — QUETIAPINE FUMARATE 25 MG PO TABS
25.0000 mg | ORAL_TABLET | Freq: Two times a day (BID) | ORAL | Status: DC
  Administered 2018-06-12 – 2018-06-15 (×7): 25 mg
  Filled 2018-06-12 (×7): qty 1

## 2018-06-12 MED ORDER — PANTOPRAZOLE SODIUM 40 MG PO PACK
40.0000 mg | PACK | Freq: Every day | ORAL | Status: DC
  Administered 2018-06-12 – 2018-06-20 (×8): 40 mg
  Filled 2018-06-12 (×9): qty 20

## 2018-06-12 MED ORDER — COLCHICINE 0.6 MG PO TABS
1.2000 mg | ORAL_TABLET | Freq: Once | ORAL | Status: AC
  Administered 2018-06-12: 1.2 mg via ORAL
  Filled 2018-06-12: qty 2

## 2018-06-12 MED ORDER — FUROSEMIDE 10 MG/ML IJ SOLN
40.0000 mg | Freq: Once | INTRAMUSCULAR | Status: AC
  Administered 2018-06-12: 40 mg via INTRAVENOUS
  Filled 2018-06-12: qty 4

## 2018-06-12 MED ORDER — POTASSIUM CHLORIDE 20 MEQ/15ML (10%) PO SOLN
40.0000 meq | ORAL | Status: DC
  Filled 2018-06-12: qty 30

## 2018-06-12 MED ORDER — AMOXICILLIN-POT CLAVULANATE 875-125 MG PO TABS
1.0000 | ORAL_TABLET | Freq: Two times a day (BID) | ORAL | Status: DC
  Administered 2018-06-12 (×2): 1 via ORAL
  Filled 2018-06-12 (×5): qty 1

## 2018-06-12 MED ORDER — COLCHICINE 0.6 MG PO TABS
0.6000 mg | ORAL_TABLET | ORAL | Status: DC
  Filled 2018-06-12 (×2): qty 1

## 2018-06-12 MED ORDER — METOPROLOL TARTRATE 25 MG/10 ML ORAL SUSPENSION
25.0000 mg | Freq: Two times a day (BID) | ORAL | Status: DC
  Filled 2018-06-12: qty 10

## 2018-06-12 MED ORDER — DIGOXIN 0.05 MG/ML PO SOLN
0.1250 mg | Freq: Every day | ORAL | Status: DC
  Administered 2018-06-12 – 2018-06-20 (×9): 0.125 mg
  Filled 2018-06-12 (×9): qty 2.5

## 2018-06-12 MED ORDER — VITAMIN B-1 100 MG PO TABS
100.0000 mg | ORAL_TABLET | Freq: Every day | ORAL | Status: DC
  Administered 2018-06-12 – 2018-06-20 (×9): 100 mg
  Filled 2018-06-12 (×8): qty 1

## 2018-06-12 MED ORDER — CLONIDINE HCL 0.1 MG PO TABS: 0.1000 mg | ORAL_TABLET | Freq: Three times a day (TID) | ORAL | Status: DC | PRN

## 2018-06-12 MED ORDER — MAGNESIUM SULFATE 2 GM/50ML IV SOLN
2.0000 g | Freq: Once | INTRAVENOUS | Status: AC
  Administered 2018-06-12: 2 g via INTRAVENOUS
  Filled 2018-06-12: qty 50

## 2018-06-12 MED ORDER — PREDNISONE 20 MG PO TABS
20.0000 mg | ORAL_TABLET | Freq: Every day | ORAL | Status: DC
  Filled 2018-06-12: qty 1

## 2018-06-12 MED ORDER — COLCHICINE 0.6 MG PO TABS
0.6000 mg | ORAL_TABLET | ORAL | Status: AC
  Administered 2018-06-12 (×2): 0.6 mg
  Filled 2018-06-12 (×2): qty 1

## 2018-06-12 MED ORDER — METOPROLOL TARTRATE 25 MG/10 ML ORAL SUSPENSION
25.0000 mg | Freq: Two times a day (BID) | ORAL | Status: DC
  Administered 2018-06-12 – 2018-06-15 (×8): 25 mg
  Filled 2018-06-12 (×7): qty 10

## 2018-06-12 MED ORDER — OSMOLITE 1.5 CAL PO LIQD
1000.0000 mL | ORAL | Status: DC
  Administered 2018-06-12: 1000 mL
  Filled 2018-06-12 (×2): qty 1000

## 2018-06-12 MED ORDER — POTASSIUM CHLORIDE 20 MEQ/15ML (10%) PO SOLN
40.0000 meq | ORAL | Status: AC
  Administered 2018-06-12 (×2): 40 meq
  Filled 2018-06-12: qty 30

## 2018-06-12 MED ORDER — WHITE PETROLATUM EX OINT
TOPICAL_OINTMENT | CUTANEOUS | Status: AC
  Administered 2018-06-12: 0.2
  Filled 2018-06-12: qty 28.35

## 2018-06-12 NOTE — Progress Notes (Signed)
NAME:  Robert [name], MRN:  [id number], DOB:  September 20, 1960, LOS: 6 ADMISSION DATE:  06/06/2018, CONSULTATION DATE:  06/07/2018 REFERRING MD:  TRH, CHIEF COMPLAINT:  Hypoxia/ resp distress  Brief History   58 year old man with probable EtOH cardiomyopathy, Afib not on AC due to PUD/anemia, tobacco/EtOH abuse, presenting with R MCA stroke and likely aspiration pneumonitis.  Hospital course complicated by agitation ? w/d, rhabdomyolysis, afib/RVR, and secretion management.  Consulting services: Neurology, Cardiology, SLP, PT, OT  History of present illness   HPI obtained from medical chart review given patient's respiratory distress.   58 year old male with history of Afib not on AC 2/2 GIB, DMT2, PUD, HTN, HLD, gout, and ETOH abuse who presented from home on 5/27 with altered mental status and some slurred speech.  Patient lives in different rooms than his wife and last seen in normal 5/26 but she found him confused and laying on the ground.    His workup showed a large sub acute right MCA stroke.  His initial CXR was negative.  Neurology was consulted and he was admitted to medical floor to hospitalist service.  He remained NPO after failing swallow study. He has had positive troponin and BNP.  EKG with afib/ non acute.   Follow-up CTH showing evolving subacute R MCA infarction on the morning of 5/28.  Later that morning, he had acute hypoxic decompensation and respiratory distress requiring NRB and transferred to ICU with PCCM consultation.  He required emergent intubation on arrival to ICU.   Past Medical History  Afib not on AC 2/2 GIB, DMT2, PUD, HTN, HLD, gout, ETOH abuse, former smoker  Significant Hospital Events   5/27 Admitted 5/28 decompensated/ hypoxic/ intubated  5/30 extubated  Consults:  Neurology  Cardiology 5/30   Procedures:  5/28 ETT >>5/30  Significant Diagnostic Tests:  5/27 CTH and cervical >> CT of the head: Changes consistent with acute to subacute ischemia in  the distribution of the right middle cerebral artery. Encephalomalacia in the right posterior parietal lobe is noted consistent with prior ischemia. CT of cervical spine: Large anterior osteophytes without acute Abnormality.  5/28 CTH >> Evolving subacute infarction of the right MCA territory without significant change from the baseline CT, and no evidence hemorrhagic transformation. Redemonstration encephalomalacia of right parietal region.  CT Chest 5/29>> No PE identified on today's exam. The main pulmonary artery is dilated which can be seen in patients with elevated PA pressures. Extensive bilateral multifocal consolidation as detailed above concerning for multifocal pneumonia or aspiration. Some debris is noted within the trachea. The endotracheal tube terminates somewhat high at the level of the thoracic inlet. Repositioning should be considered. Small to moderate-sized bilateral pleural effusions. Enteric tube terminates in the stomach. Cardiomegaly. There is reflux of contrast in the IVC consistent with some degree of underlying cardiac dysfunction.  Micro Data:  5/27 SARS coronavirus 2>> negative 5/28 MRSA PCR >>NEG  5/28 trach asp >>normal resp flora  5/27 HIV neg  Antimicrobials:  5/28 unasyn >>  Interim history/subjective:  No major events overnight.  C/o R ankle pain from gout.  Wants to know when he can go home.   Objective   Blood pressure (!) 141/86, pulse 81, temperature 99.5 F (37.5 C), temperature source Axillary, resp. rate 10, height 5\' 10"  (1.778 m), weight 83.9 kg, SpO2 99 %.        Intake/Output Summary (Last 24 hours) at 06/12/2018 0736 Last data filed at 06/12/2018 0500 Gross per 24 hour  Intake 1183.06 ml  Output 3635 ml  Net -2451.94 ml   Filed Weights   06/10/18 0500 06/11/18 0500 06/12/18 0500  Weight: 84.4 kg 86.6 kg 83.9 kg   Examination: General: obese man in NAD HEENT: MM dry, malampatti 4, R eye chemosis Neuro: awake, alert,  L>R mild 5-/5 weakness, CN intact and summetric CV: Irregular irregular, no MRG PULM: transmitted upper airway sounds from retained oropharyngeal secretions, no accessory muscle use GI: Obese soft nontender, hypoactive BS Extremities: Warm and dry no edema, R ankle with some TTP Skin: No rashes warm  Resolved Hospital Problem list   AKI, mild rhabdo  Assessment & Plan:  Aspiration pneumonia vs. Pneumonitis with acute hypoxic respiratory failure Culture negative Successfully extubated 06/09/18 PO status remains questionable  -Switch to augmentin to complete 7 day course -Continue CPT with hypertonic nebs qid, upright as much as possible; OOB to chair  Obesity Needs OP sleep referral  Subacute R MCA stroke MRI unable to be performed due to metal object seen in Left eye on CT CT 5/29 large right MCA stable in size mild regional mass-effect with a 2 mm right to left shift, negative for acute large vessel occlusion, 70% stenosis at the proximal right ICA.  Left carotid artery patent.  Occlusion of the right vertebral artery.  Dominant left vertebral artery patent. Echo/bubble w/o clot  -Appreciate neuro input -Full dose ASA + statin -Defer DOAC discussion to neuro and patient given hx of symptomatic PUD   Afib/RVR  Rate controlled Has some demand mismatch troponin leak, max 2 Cardiology following -Dig, ASA, statin -Switch IV metoprolol to PO   Encephalopathy/agitation Hx ETOH use Question of w/d vs. ICU delirium He is actually pretty oriented today, suspicion of timing for ICU delirium  -Daily thiamine/ folate/ MVI -Start standing seroquel, QTc okay 5/30 -PRN clonidine and serax for breakthrough  Hx PUD/ prior GIB -Monitor for s/s of bleeding -QOD CBC  DMT2 a1c 5.5% -PTA metformin on hold -FS up now that TF start, start SSI  HLD -Statin  Gout flare - colchicine+prednisone as ordered  Best practice:  Diet:  TF+supplements, appreciate dietitican assistance  Pain/Anxiety/Delirium protocol precedex with plans to wean VAP protocol (if indicated): na  DVT prophylaxis: heparin SQ/ SCDs GI prophylaxis: PPI  Glucose control: q4h SSI Mobility: PT to work with pt  Code Status: Full  Family Communication:  Will call today.  Disposition: Should be okay for floor transfer today  Erskine Emery MD PCCM [phone]

## 2018-06-12 NOTE — Progress Notes (Signed)
Pt arrived to 3W09. Alert and oriented to self, and year. Disoriented to situation. No pain. Telemetry verified.

## 2018-06-12 NOTE — Progress Notes (Signed)
Nutrition Follow-up  DOCUMENTATION CODES:   Not applicable  INTERVENTION:   D/C Vital High Protein  Osmolite 1.5 @ 55 ml/hr via Cortrak Continue 30 ml Prostat BID  Provides: 2180 kcal, 112 grams protein, and 1005 ml free water.    NUTRITION DIAGNOSIS:   Inadequate oral intake related to dysphagia(s/p MCA stroke) as evidenced by NPO status. Ongoing  GOAL:   Patient will meet greater than or equal to 90% of their needs Progressing  MONITOR:   Vent status, Labs, Weight trends, TF tolerance  REASON FOR ASSESSMENT:   Consult, Ventilator Enteral/tube feeding initiation and management  ASSESSMENT:   42 yoM with Afib not on AC and ETOH abuse presented 5/27 with AMS found to have large subacute R MCA stroke.  5/30 extubated 6/1 Cortrak placed (gastric)   Pt declined MBS from SLP today. They continue to follow.   Medications reviewed and include: folic acid, SSI, MVI, prednisone, thiamine  Labs reviewed: K+ 3.1 (L)  TF via Cortrak: Vital High Protein @ 40 ml/hr with 30 ml Prostat BID Provides: 1160 kcal 114 grams protein.   Diet Order:   Diet Order            Diet NPO time specified  Diet effective now              EDUCATION NEEDS:   Not appropriate for education at this time  Skin:  Skin Assessment: Reviewed RN Assessment  Last BM:  6/2 large  Height:   Ht Readings from Last 1 Encounters:  06/07/18 5\' 10"  (1.778 m)    Weight:   Wt Readings from Last 1 Encounters:  06/12/18 83.9 kg    Ideal Body Weight:  75.5 kg  BMI:  Body mass index is 26.54 kg/m.  Estimated Nutritional Needs:   Kcal:  2100-2300  Protein:  105-120 grams  Fluid:  2.1L/day  Maylon Peppers RD, LDN, [place] Pager [phone] After Hours Pager

## 2018-06-12 NOTE — Progress Notes (Signed)
  Speech Language Pathology Treatment: Dysphagia  Patient Details Name: Robert [name] MRN: [id number] DOB: 1960/11/16 Today's Date: 06/12/2018 Time: 9021-1155 SLP Time Calculation (min) (ACUTE ONLY): 20 min  Assessment / Plan / Recommendation Clinical Impression  Patient seen to address dysphagia goals and determine readiness for objective swallow evaluation. Patient was agreeable to PO trials, requesting "water". He assisted in performing oral care and oral, only minimal amount of dry secretions were removed from hard palate. Patient took small straw sips of thin liquids and exhibited both delayed and immediate coughing, watering eyes, one time became very red in the face and was acting as though he could not breath. Patient continues to be very fearful of coughing and getting choked. SLP educated him regarding the fact that coughing does not necessarily mean he is aspirating and that an MBS swallow study could help determine that, but patient declined at this time. He has nutritional support via Cortrak and so SLP will follow patient for readiness to complete MBS.     HPI HPI: pt is a 58 yo male adm to [facility] with large right MCA territory CVA, PMH + for ETOH use, HTN, DM2, Gi bleed, and pt developed aspiration pna and was intubated on 5/25-5/30/2020.  Swallow eval ordered after pt extubated 5/30 at 0930.  CXR showed right basilar ATX, improving edema.        SLP Plan  Continue with current plan of care       Recommendations  Diet recommendations: NPO Medication Administration: Via alternative means                Oral Care Recommendations: Oral care QID Follow up Recommendations: Skilled Nursing facility SLP Visit Diagnosis: Dysphagia, unspecified (R13.10) Plan: Continue with current plan of care       GO                Robert [name] 06/12/2018, 1:32 PM    Sonia Baller, MA, CCC-SLP Speech Therapy [facility] Acute Rehab Pager: [phone]

## 2018-06-12 NOTE — Progress Notes (Signed)
STROKE TEAM PROGRESS NOTE   SUBJECTIVE (INTERVAL HISTORY) Patient lying in bed, more awake alert, dysarthria improved however still at least moderate.  Dr. Debara Pickett discussed with patient about anticoagulation, he seems okay with anticoagulation although hesitant.  He complains of right ankle pain, resembles his usual gout pain.  CCM added prednisone  OBJECTIVE Vitals:   06/12/18 1400 06/12/18 1500 06/12/18 1611 06/12/18 1801  BP: 104/65 128/87  (!) 114/102  Pulse: 84 87  (!) 147  Resp:    20  Temp:    98.9 F (37.[name] C)  TempSrc:    Oral  SpO2: 97% 98% 95% 93%  Weight:      Height:        CBC:  Recent Labs  Lab 06/06/18 2051  06/11/18 0554 06/12/18 0559  WBC 15.9*   < > 8.[name] 11.[name]*  NEUTROABS 13.1*  --   --   --   HGB 14.[name]   < > 12.5* 11.9*  HCT 43.6   < > 39.1 35.7*  MCV 93.[name]   < > 94.[name] 90.8  PLT 240   < > 198 232   < > = values in this interval not displayed.    Basic Metabolic Panel:  Recent Labs  Lab 06/11/18 0554  06/12/18 0559 06/12/18 1644  NA 142  --  139  --   K 4.0  --  3.1*  --   CL 104  --  99  --   CO2 23  --  29  --   GLUCOSE 115*  --  185*  --   BUN 12  --  15  --   CREATININE 0.90  --  0.81  --   CALCIUM 8.5*  --  8.5*  --   MG 1.9  --  1.8 [name].1  PHOS 3.0   < > 3.0 [name].5   < > = values in this interval not displayed.    Lipid Panel:     Component Value Date/Time   CHOL 198 06/07/2018 0628   TRIG 72 06/07/2018 0628   HDL 81 06/07/2018 0628   CHOLHDL [name].4 06/07/2018 0628   VLDL 14 06/07/2018 0628   LDLCALC 103 (H) 06/07/2018 0628   HgbA1c:  Lab Results  Component Value Date   HGBA1C 5.5 06/07/2018   Urine Drug Screen:     Component Value Date/Time   LABOPIA NONE DETECTED 06/06/2018 2013   COCAINSCRNUR NONE DETECTED 06/06/2018 2013   LABBENZ NONE DETECTED 06/06/2018 2013   AMPHETMU NONE DETECTED 06/06/2018 2013   THCU NONE DETECTED 06/06/2018 2013   LABBARB NONE DETECTED 06/06/2018 2013    Alcohol Level     Component Value Date/Time    ETH <10 06/06/2018 2052    IMAGING  Ct Angio Head W Or Wo Contrast Ct Angio Neck W Or Wo Contrast 06/08/2018 IMPRESSION:   CT HEAD  1. Continued interval evolution of large right MCA territory infarct, stable in size and distribution from previous. Associated mild regional mass effect with trace [name] mm right-to-left shift. Probable associated petechial hemorrhage without evidence for hemorrhagic transformation.  [name]. No other new acute intracranial abnormality.  3. Underlying atrophy with moderate chronic small vessel ischemic disease, with additional multiple chronic infarcts as above.   CTA HEAD AND NECK  1. Negative CTA for acute large vessel occlusion.  [name]. 70% atheromatous stenosis at the proximal right ICA. Left carotid artery system widely patent within the neck.  3. Occlusion of the right vertebral artery at its  origin. Dominant left vertebral artery widely patent within the neck. Moderate proximal left V4 stenosis.  4. Moderate to large layering bilateral pleural effusions with associated atelectasis and/or consolidation.    Ct Angio Chest Pe W Or Wo Contrast 06/07/2018 IMPRESSION:  1. No PE identified on today's exam. The main pulmonary artery is dilated which can be seen in patients with elevated PA pressures.  [name]. Extensive bilateral multifocal consolidation as detailed above concerning for multifocal pneumonia or aspiration. Some debris is noted within the trachea.  3. The endotracheal tube terminates somewhat high at the level of the thoracic inlet. Repositioning should be considered.  4. Small to moderate-sized bilateral pleural effusions.  5. Enteric tube terminates in the stomach.  6. Cardiomegaly. There is reflux of contrast in the IVC consistent with some degree of underlying cardiac dysfunction. Aortic Atherosclerosis (ICD10-I70.0).   Dg Chest Port 1 View 06/09/2018 IMPRESSION:  1. Well-positioned support structures.  [name]. Decreased right perihilar opacity, suggesting  improving pulmonary edema or improving atelectasis.  3. Stable small right pleural effusion.  4. Similar patchy left retrocardiac opacity.   Dg Abd Portable 1v 06/07/2018 IMPRESSION:  Orogastric tube with the tip projecting over the stomach.    Transthoracic Echocardiogram  Reduced ejection fraction of 35 to 40%.  No wall motion abnormalities.  EKG - Afib RVR  EEG  IMPRESSION:  This recording shows moderate global slowing indicating a moderate global encephalopathy.  However, no epileptiform discharges are observed.   PHYSICAL EXAM  Temp:  [98.[name] F (36.8 C)-99.5 F (37.5 C)] 98.9 F (37.[name] C) (06/02 1801) Pulse Rate:  [58-228] 147 (06/02 1801) Resp:  [10-22] 20 (06/02 1801) BP: (104-141)/(62-105) 114/102 (06/02 1801) SpO2:  [77 %-100 %] 93 % (06/02 1801) Weight:  [83.9 kg] 83.9 kg (06/02 0500)  General - Well nourished, well developed, in no apparent distress  Ophthalmologic - fundi not visualized due to noncooperation.  Cardiovascular - irregularly irregular heart rate and rhythm.  Neuro - awake alert, orientated to time, place and age. He follows simple commands. Severe dysarthria but able to name and repeat simple sentence. Paucity of speech. No gaze deviation. PERRL, EOMI, Visual field not cooperated on exam, however blinking to visual threat bilaterally. left facial droop, tongue protrusion to the left, BUE 4/5. BLE distal 5/5, proximal left LE 4/5 and RLE 3/5 due to chronic right hip arthritis. Left lower facial weakness. Deep tendon reflexes are symmetric.  Plantars downgoing. Sensation symmetrical. FTN not cooperative. Gait not tested.    ASSESSMENT/PLAN Robert [name] is a 58 y.o. male with history of Etoh Abuse, Afib not on [place] d/t GIB, DM, peptic ulcer, hypertension, hyperlipidemia, gout who presented with AMS and dysarthria. Found to have large subacute RMCA infarct.  Stroke: R LARGE MCA infarct due to A. fib not on AC vs. right ICA high-grade  stenosis  Resultant severe dysarthria  CT head R LARGE MCA stroke  MRI head - not able to performed due to metal in skull  CTA H&N - 70% stenosis proximal right ICA.  Occlusion of the right vertebral artery at its origin.   2D Echo - EF 35 to 40%.  No wall motion abnormalities.  EEG - moderate global slowing indicating a moderate global encephalopathy  Robert [name]  - negative  LDL - 103  HgbA1c - 5.5  UDS - negative  VTE prophylaxis - lovenox  None prior to admission, now on ASA 325mg . Pt agrees with DOAC after discussion with Dr. Debara Pickett. Will recommend  eliquis on 06/16/18 to avoid hemorrhagic conversion.   Ongoing aggressive stroke risk factor management  Therapy recommendations:  SNF  Disposition:  Pending  Chronic A. fib not on AC  Status post cardioversion 2015  Off Xarelto due to GI bleeding and anemia in 2015  Patient also declined watchman device   On digoxin and IV metoprolol 5 mg Q6 hrs as well as prn.  Discussed with cardiology Dr. Debara Pickett, he agrees with Eliquis. Pt agrees with DOAC after discussion with Dr. Debara Pickett. Will recommend eliquis on 06/16/18 to avoid hemorrhagic conversion.  Respiratory failure  Intubated for hypoxia on the floor  CTA chest no PE but bilateral pneumonia and pleural effusion  CXR Increasing bibasilar hazy opacity  Extubated 06/09/18  On Unasyn started Thursday 5/28  Still has copious secretions  On 3% neb  NT suctioning  AKI  Creatinine 1.00-1.57-0.82->0.90->0.81  On IV fluid  Continue monitoring  Elevated troponin  Troponin I 0.43-[name].50-[name].07-[name].02-1.77  Likely demand ischemia  Cardiology consult - Troponins "may represent demand in setting of hypotension, rhabdomyolysis, afib with  RVR"  Leukocytosis  WBC 15.9-19.[name]-11.3->10.1-> 8.[name]->11.[name]  Afebrile  Aspiration PNA - IV Unasyn course finished  Continue monitoring  NT suctioning  Dysphagia  Severe dysarthria  Speech following  On cortrak and  tube feeding  Hypertension  Hypotension much improved . Pressors as needed with IVF . Long-term BP goal normotensive  Hyperlipidemia  Lipid lowering medication PTA:  none  LDL 103, goal < 70  Current lipid lowering medication:Lipitor 40mg  once po access  Continue statin at discharge  Tobacco abuse  Current smoker  Smoking cessation counseling provided  Pt is willing to quit  Agitation  Treated with Ativan 5/31  Was on Precedex, now off  Close monitoring  On mittens as early  Gout  history of gout  Right ankle pain  CCM added prednisone  Other Stroke Risk Factors  ETOH use; advise when able to quit - on FA/MVI/B1  Evidence of prior stroke seen on CT  Other Active Problems  Hypokalemia - [name].9 ->3.[name] -> 4.0  Hospital day # 6  Neurology will sign off. Please call with questions. Pt will follow up with stroke clinic NP at [facility] in about 4 weeks. Thanks for the consult.  Rosalin Hawking, MD PhD Stroke Neurology 6/[name]/2020 6:33 PM   To contact Stroke Continuity provider, please refer to [email or web address] After hours, contact General Neurology

## 2018-06-12 NOTE — Progress Notes (Signed)
IVs on patient's left hand infiltrated.  RN called pharmacy and was told to elevate affect limb and place cold compress on the arm.  Will continue to monitor IV sites.

## 2018-06-12 NOTE — Telephone Encounter (Signed)
Patient's wife called in to let Dr. Anitra Lauth know that the patient had a stroke. No current requests, just an FYI. Expressed sympathy and asked that she call back if there was anything our office can help with.

## 2018-06-12 NOTE — Progress Notes (Addendum)
DAILY PROGRESS NOTE   Patient Name: Robert [name] Date of Encounter: 06/12/2018 Cardiologist: No primary care provider on file.  Chief Complaint   No complaints  Patient Profile   58 yo male with etoh abuse disorder, non-complaince with anticoagulation, persistent afib and large stroke, also followed by [facility] cardiology for cardiomyopathy with LVEF 30-35%  Subjective   Good response to lasix yesterday - out 2.4L. Sats now close to 100%. Afib is rate-controlled. On ASA and statin - willing to consider DOAC in the near future.  Objective   Vitals:   06/12/18 0500 06/12/18 0600 06/12/18 0700 06/12/18 0800  BP: 121/82 (!) 141/86    Pulse: (!) 110 71 81   Resp: (!) 22 17 10    Temp:    99.5 F (37.5 C)  TempSrc:    Oral  SpO2: 100% 100% 99%   Weight: 83.9 kg     Height:        Intake/Output Summary (Last 24 hours) at 06/12/2018 6761 Last data filed at 06/12/2018 0500 Gross per 24 hour  Intake 978.45 ml  Output 3635 ml  Net -2656.55 ml   Filed Weights   06/10/18 0500 06/11/18 0500 06/12/18 0500  Weight: 84.4 kg 86.6 kg 83.9 kg    Physical Exam   General appearance: alert and no distress Lungs: clear to auscultation bilaterally Heart: irregularly irregular rhythm Extremities: edema 1+ edema, swelling and warmth of the right great toe, ankles and wrists and ecchymosis Neurologic: Mental status: Confused, but less so today  Inpatient Medications    Scheduled Meds: . amoxicillin-clavulanate  1 tablet Oral Q12H  . aspirin  325 mg Per Tube Daily   Or  . aspirin  300 mg Rectal Daily  . atorvastatin  40 mg Per Tube q1800  . chlorhexidine  15 mL Mouth Rinse BID  . Chlorhexidine Gluconate Cloth  6 each Topical Daily  . colchicine  0.6 mg Per Tube Q2H  . digoxin  0.125 mg Per Tube Daily  . feeding supplement (PRO-STAT SUGAR FREE 64)  30 mL Per Tube BID  . feeding supplement (VITAL HIGH PROTEIN)  1,000 mL Per Tube Q24H  . folic acid  1 mg Per Tube Daily  . heparin   5,000 Units Subcutaneous Q8H  . insulin aspart  0-20 Units Subcutaneous Q4H  . mouth rinse  15 mL Mouth Rinse q12n4p  . metoprolol tartrate  25 mg Per Tube BID  . multivitamin with minerals  1 tablet Per Tube Daily  . pantoprazole sodium  40 mg Per Tube Daily  . potassium chloride  40 mEq Per Tube Q2H  . predniSONE  20 mg Per Tube Daily  . QUEtiapine  25 mg Per Tube BID  . sodium chloride HYPERTONIC  4 mL Nebulization QID  . thiamine  100 mg Per Tube Daily  . white petrolatum        Continuous Infusions: . sodium chloride Stopped (06/10/18 1526)  . sodium chloride Stopped (06/12/18 0432)  . lactated ringers Stopped (06/11/18 1222)  . magnesium sulfate bolus IVPB      PRN Meds: sodium chloride, acetaminophen **OR** acetaminophen (TYLENOL) oral liquid 160 mg/5 mL **OR** acetaminophen, bisacodyl, chlordiazePOXIDE, cloNIDine, docusate, ipratropium-albuterol, senna-docusate   Labs   Results for orders placed or performed during the hospital encounter of 06/06/18 (from the past 48 hour(s))  Glucose, capillary     Status: None   Collection Time: 06/10/18 11:24 AM  Result Value Ref Range   Glucose-Capillary 88 70 - 99  mg/dL   Comment 1 Notify RN    Comment 2 Document in Chart   Glucose, capillary     Status: None   Collection Time: 06/10/18  3:17 PM  Result Value Ref Range   Glucose-Capillary 94 70 - 99 mg/dL   Comment 1 Notify RN    Comment 2 Document in Chart   Glucose, capillary     Status: None   Collection Time: 06/10/18  7:37 PM  Result Value Ref Range   Glucose-Capillary 96 70 - 99 mg/dL  Glucose, capillary     Status: None   Collection Time: 06/10/18 11:23 PM  Result Value Ref Range   Glucose-Capillary 97 70 - 99 mg/dL  Glucose, capillary     Status: Abnormal   Collection Time: 06/11/18  3:25 AM  Result Value Ref Range   Glucose-Capillary 106 (H) 70 - 99 mg/dL  Digoxin level     Status: Abnormal   Collection Time: 06/11/18  5:54 AM  Result Value Ref Range    Digoxin Level 0.6 (L) 0.8 - 2.0 ng/mL    Comment: Performed at [facility], [place] [address] [place], [place] [address]  Basic metabolic panel     Status: Abnormal   Collection Time: 06/11/18  5:54 AM  Result Value Ref Range   Sodium 142 135 - 145 mmol/L   Potassium 4.0 3.5 - 5.1 mmol/L    Comment: DELTA CHECK NOTED   Chloride 104 98 - 111 mmol/L   CO2 23 22 - 32 mmol/L   Glucose, Bld 115 (H) 70 - 99 mg/dL   BUN 12 6 - 20 mg/dL   Creatinine, Ser 0.90 0.61 - 1.24 mg/dL   Calcium 8.5 (L) 8.9 - 10.3 mg/dL   GFR calc non Af Amer >60 >60 mL/min   GFR calc Af Amer >60 >60 mL/min   Anion gap 15 5 - 15    Comment: Performed at [facility], [place] [address], [place], [place] [address]  Magnesium     Status: None   Collection Time: 06/11/18  5:54 AM  Result Value Ref Range   Magnesium 1.9 1.7 - 2.4 mg/dL    Comment: Performed at [place] [address] [place], [place] [address]  Phosphorus     Status: None   Collection Time: 06/11/18  5:54 AM  Result Value Ref Range   Phosphorus 3.0 2.5 - 4.6 mg/dL    Comment: Performed at [facility] [address], [place],  [address]  CBC     Status: Abnormal   Collection Time: 06/11/18  5:54 AM  Result Value Ref Range   WBC 8.2 4.0 - 10.5 K/uL   RBC 4.15 (L) 4.22 - 5.81 MIL/uL   Hemoglobin 12.5 (L) 13.0 - 17.0 g/dL   HCT 39.1 39.0 - 52.0 %   MCV 94.2 80.0 - 100.0 fL   MCH 30.1 26.0 - 34.0 pg   MCHC 32.0 30.0 - 36.0 g/dL   RDW 13.2 11.5 - 15.5 %   Platelets 198 150 - 400 K/uL   nRBC 0.0 0.0 - 0.2 %    Comment: Performed at [facility], [place] [address] [place], [place] [address]  Glucose, capillary     Status: None   Collection Time: 06/11/18  8:22 AM  Result Value Ref Range   Glucose-Capillary 96 70 - 99 mg/dL   Comment 1 Notify RN    Comment 2 Document in Chart  Glucose, capillary     Status: None   Collection Time: 06/11/18 12:20 PM  Result Value Ref Range   Glucose-Capillary 85 70 - 99  mg/dL   Comment 1 Notify RN    Comment 2 Document in Chart   Glucose, capillary     Status: Abnormal   Collection Time: 06/11/18  3:54 PM  Result Value Ref Range   Glucose-Capillary 69 (L) 70 - 99 mg/dL   Comment 1 Notify RN    Comment 2 Document in Chart   Phosphorus     Status: None   Collection Time: 06/11/18  4:47 PM  Result Value Ref Range   Phosphorus 3.4 2.5 - 4.6 mg/dL    Comment: Performed at [facility], [place] [address], [place], [place] [address]  Glucose, capillary     Status: Abnormal   Collection Time: 06/11/18  5:32 PM  Result Value Ref Range   Glucose-Capillary 104 (H) 70 - 99 mg/dL  Glucose, capillary     Status: Abnormal   Collection Time: 06/11/18  8:03 PM  Result Value Ref Range   Glucose-Capillary 134 (H) 70 - 99 mg/dL  Glucose, capillary     Status: Abnormal   Collection Time: 06/11/18 11:25 PM  Result Value Ref Range   Glucose-Capillary 142 (H) 70 - 99 mg/dL  Glucose, capillary     Status: Abnormal   Collection Time: 06/12/18  3:38 AM  Result Value Ref Range   Glucose-Capillary 159 (H) 70 - 99 mg/dL  Basic metabolic panel     Status: Abnormal   Collection Time: 06/12/18  5:59 AM  Result Value Ref Range   Sodium 139 135 - 145 mmol/L   Potassium 3.1 (L) 3.5 - 5.1 mmol/L    Comment: DELTA CHECK NOTED   Chloride 99 98 - 111 mmol/L   CO2 29 22 - 32 mmol/L   Glucose, Bld 185 (H) 70 - 99 mg/dL   BUN 15 6 - 20 mg/dL   Creatinine, Ser 0.81 0.61 - 1.24 mg/dL   Calcium 8.5 (L) 8.9 - 10.3 mg/dL   GFR calc non Af Amer >60 >60 mL/min   GFR calc Af Amer >60 >60 mL/min   Anion gap 11 5 - 15    Comment: Performed at [facility], [place] [address], [place], [place] [address]  Magnesium     Status: None   Collection Time: 06/12/18  5:59 AM  Result Value Ref Range   Magnesium 1.8 1.7 - 2.4 mg/dL    Comment: Performed at [place] [address], [place], [place] [address]  CBC     Status: Abnormal   Collection Time: 06/12/18  5:59 AM  Result  Value Ref Range   WBC 11.2 (H) 4.0 - 10.5 K/uL   RBC 3.93 (L) 4.22 - 5.81 MIL/uL   Hemoglobin 11.9 (L) 13.0 - 17.0 g/dL   HCT 35.7 (L) 39.0 - 52.0 %   MCV 90.8 80.0 - 100.0 fL   MCH 30.3 26.0 - 34.0 pg   MCHC 33.3 30.0 - 36.0 g/dL   RDW 12.9 11.5 - 15.5 %   Platelets 232 150 - 400 K/uL   nRBC 0.0 0.0 - 0.2 %    Comment: Performed at [facility], [place] [address] [place], [place] [address]  Phosphorus     Status: None   Collection Time: 06/12/18  5:59 AM  Result Value Ref Range   Phosphorus 3.0 2.5 - 4.6 mg/dL  Comment: Performed at [facility], [place] [address] [place], [place] [address]  Glucose, capillary     Status: Abnormal   Collection Time: 06/12/18  8:06 AM  Result Value Ref Range   Glucose-Capillary 125 (H) 70 - 99 mg/dL   Comment 1 Notify RN    Comment 2 Document in Chart     ECG   N/A  Telemetry   Afib with CVR - Personally Reviewed  Radiology    No results found.  Cardiac Studies   N/A  Assessment   Principal Problem:   Stroke [facility]) Active Problems:   Type II diabetes mellitus (HCC)   A-fib (HCC)   Hyperlipidemia   Hypertension   Alcohol abuse   Endotracheal tube present   Altered mental status   Plan   Robert [name] is more clear today. Good response to diuretics. Will give additional lasix today. He has required potassium supplementation - will give more this afternoon. Agree with switch to oral BB. Hopeful to advance diet and off TF's. He is concerned about anticoagulation, but understands failure to anticoagulate going forward could lead to fatal stroke. He must also be abstinent from alcohol. Also noted digoxin added by PCCM - he is rate-controlled, but may add some mild inotropic benefit.  Time Spent Directly with Patient:  I have spent a total of 25 minutes with the patient reviewing hospital notes, telemetry, EKGs, labs and examining the patient as well as establishing an assessment and plan that was discussed personally with  the patient.  > 50% of time was spent in direct patient care.  Length of Stay:  LOS: 6 days   Pixie Casino, MD, [facility], [place] Director of the Advanced Lipid Disorders &  Cardiovascular Risk Reduction Clinic Diplomate of the American Board of Clinical Lipidology Attending Cardiologist  Direct Dial: [phone]  Fax: [phone]  Website:  [email or web address] 06/12/2018, 9:21 AM

## 2018-06-13 DIAGNOSIS — N179 Acute kidney failure, unspecified: Secondary | ICD-10-CM

## 2018-06-13 LAB — GLUCOSE, CAPILLARY
Glucose-Capillary: 111 mg/dL — ABNORMAL HIGH (ref 70–99)
Glucose-Capillary: 147 mg/dL — ABNORMAL HIGH (ref 70–99)
Glucose-Capillary: 148 mg/dL — ABNORMAL HIGH (ref 70–99)
Glucose-Capillary: 151 mg/dL — ABNORMAL HIGH (ref 70–99)
Glucose-Capillary: 175 mg/dL — ABNORMAL HIGH (ref 70–99)
Glucose-Capillary: 207 mg/dL — ABNORMAL HIGH (ref 70–99)

## 2018-06-13 LAB — MAGNESIUM
Magnesium: 1.9 mg/dL (ref 1.7–2.4)
Magnesium: 1.9 mg/dL (ref 1.7–2.4)

## 2018-06-13 LAB — BASIC METABOLIC PANEL
Anion gap: 15 (ref 5–15)
BUN: 16 mg/dL (ref 6–20)
CO2: 28 mmol/L (ref 22–32)
Calcium: 9 mg/dL (ref 8.9–10.3)
Chloride: 100 mmol/L (ref 98–111)
Creatinine, Ser: 1 mg/dL (ref 0.61–1.24)
GFR calc Af Amer: >60 mL/min (ref >60)
GFR calc non Af Amer: >60 mL/min (ref >60)
Glucose, Bld: 115 mg/dL — ABNORMAL HIGH (ref 70–99)
Potassium: 3.2 mmol/L — ABNORMAL LOW (ref 3.5–5.1)
Sodium: 143 mmol/L (ref 135–145)

## 2018-06-13 LAB — PHOSPHORUS: Phosphorus: 2.9 mg/dL (ref 2.5–4.6)

## 2018-06-13 MED ORDER — POTASSIUM CHLORIDE 10 MEQ/100ML IV SOLN
10.0000 meq | INTRAVENOUS | Status: AC
  Administered 2018-06-13 – 2018-06-14 (×4): 10 meq via INTRAVENOUS
  Filled 2018-06-13 (×4): qty 100

## 2018-06-13 MED ORDER — AMOXICILLIN-POT CLAVULANATE 875-125 MG PO TABS
1.0000 | ORAL_TABLET | Freq: Two times a day (BID) | ORAL | Status: AC
  Administered 2018-06-13 (×2): 1
  Filled 2018-06-13: qty 1

## 2018-06-13 MED ORDER — JEVITY 1.5 CAL/FIBER PO LIQD
1000.0000 mL | ORAL | Status: DC
  Administered 2018-06-13 – 2018-06-15 (×3): 1000 mL
  Filled 2018-06-13 (×12): qty 1000

## 2018-06-13 NOTE — Progress Notes (Deleted)
Patient is picked up by [facility]  transportation staffs at 2045pm, pt was stable, discharge instructions has been already given.

## 2018-06-13 NOTE — Plan of Care (Signed)
  Problem: Education: Goal: Knowledge of General Education information will improve Description Including pain rating scale, medication(s)/side effects and non-pharmacologic comfort measures Outcome: Progressing   Problem: Health Behavior/Discharge Planning: Goal: Ability to manage health-related needs will improve Outcome: Progressing   Problem: Clinical Measurements: Goal: Ability to maintain clinical measurements within normal limits will improve Outcome: Progressing Goal: Will remain free from infection Outcome: Progressing Goal: Diagnostic test results will improve Outcome: Progressing Goal: Respiratory complications will improve Outcome: Progressing Goal: Cardiovascular complication will be avoided Outcome: Progressing   Problem: Activity: Goal: Risk for activity intolerance will decrease Outcome: Progressing   Problem: Nutrition: Goal: Adequate nutrition will be maintained Outcome: Progressing   Problem: Coping: Goal: Level of anxiety will decrease Outcome: Progressing   Problem: Elimination: Goal: Will not experience complications related to bowel motility Outcome: Progressing Goal: Will not experience complications related to urinary retention Outcome: Progressing   Problem: Pain Managment: Goal: General experience of comfort will improve Outcome: Progressing   Problem: Safety: Goal: Ability to remain free from injury will improve Outcome: Progressing   Problem: Skin Integrity: Goal: Risk for impaired skin integrity will decrease Outcome: Progressing   Problem: Education: Goal: Knowledge of disease or condition will improve Outcome: Progressing Goal: Knowledge of secondary prevention will improve Outcome: Progressing Goal: Knowledge of patient specific risk factors addressed and post discharge goals established will improve Outcome: Progressing Goal: Individualized Educational Video(s) Outcome: Progressing   Problem: Coping: Goal: Will verbalize  positive feelings about self Outcome: Progressing Goal: Will identify appropriate support needs Outcome: Progressing   Problem: Health Behavior/Discharge Planning: Goal: Ability to manage health-related needs will improve Outcome: Progressing   Problem: Self-Care: Goal: Ability to participate in self-care as condition permits will improve Outcome: Progressing Goal: Verbalization of feelings and concerns over difficulty with self-care will improve Outcome: Progressing Goal: Ability to communicate needs accurately will improve Outcome: Progressing   Problem: Nutrition: Goal: Risk of aspiration will decrease Outcome: Progressing Goal: Dietary intake will improve Outcome: Progressing   Problem: Ischemic Stroke/TIA Tissue Perfusion: Goal: Complications of ischemic stroke/TIA will be minimized Outcome: Progressing  Ramesha Poster, BSN, RN 

## 2018-06-13 NOTE — Progress Notes (Signed)
Patient has been working with [facility], just needs reminded and does a good job on his own.

## 2018-06-13 NOTE — Progress Notes (Signed)
DAILY PROGRESS NOTE   Patient Name: Robert [name] Date of Encounter: 06/13/2018 Cardiologist: No primary care provider on file.  Chief Complaint   No complaints  Patient Profile   58 yo male with etoh abuse disorder, non-complaince with anticoagulation, persistent afib and large stroke, also followed by [facility] cardiology for cardiomyopathy with LVEF 30-35%  Subjective   Net negative overnight- now close to original volume status. Labs stable today. Mild hypokalemia on replacement.  Objective   Vitals:   06/13/18 0349 06/13/18 0619 06/13/18 0740 06/13/18 0925  BP: 122/67 134/83  140/86  Pulse: (!) 101 (!) 103  98  Resp: 18     Temp: 99.5 F (37.5 C)  98.8 F (37.1 C)   TempSrc: Oral     SpO2: 98%     Weight: 83.6 kg     Height:        Intake/Output Summary (Last 24 hours) at 06/13/2018 0865 Last data filed at 06/13/2018 0349 Gross per 24 hour  Intake 812.6 ml  Output 2250 ml  Net -1437.4 ml   Filed Weights   06/11/18 0500 06/12/18 0500 06/13/18 0349  Weight: 86.6 kg 83.9 kg 83.6 kg    Physical Exam   General appearance: alert and no distress Lungs: clear to auscultation bilaterally Heart: irregularly irregular rhythm Extremities: edema 1+ edema, swelling and warmth of the right great toe, ankles and wrists and ecchymosis Neurologic: Mental status: Confused, but less so today  Inpatient Medications    Scheduled Meds: . amoxicillin-clavulanate  1 tablet Per Tube Q12H  . aspirin  325 mg Per Tube Daily   Or  . aspirin  300 mg Rectal Daily  . atorvastatin  40 mg Per Tube q1800  . chlorhexidine  15 mL Mouth Rinse BID  . Chlorhexidine Gluconate Cloth  6 each Topical Daily  . digoxin  0.125 mg Per Tube Daily  . feeding supplement (PRO-STAT SUGAR FREE 64)  30 mL Per Tube BID  . folic acid  1 mg Per Tube Daily  . heparin  5,000 Units Subcutaneous Q8H  . insulin aspart  0-20 Units Subcutaneous Q4H  . mouth rinse  15 mL Mouth Rinse q12n4p  . metoprolol tartrate   25 mg Per Tube BID  . multivitamin with minerals  1 tablet Per Tube Daily  . pantoprazole sodium  40 mg Per Tube Daily  . QUEtiapine  25 mg Per Tube BID  . thiamine  100 mg Per Tube Daily    Continuous Infusions: . sodium chloride Stopped (06/10/18 1526)  . sodium chloride 10 mL/hr at 06/12/18 1600  . feeding supplement (OSMOLITE 1.5 CAL) 1,000 mL (06/12/18 1823)  . lactated ringers Stopped (06/11/18 1222)    PRN Meds: sodium chloride, acetaminophen **OR** acetaminophen (TYLENOL) oral liquid 160 mg/5 mL **OR** acetaminophen, bisacodyl, chlordiazePOXIDE, cloNIDine, docusate, ipratropium-albuterol, senna-docusate   Labs   Results for orders placed or performed during the hospital encounter of 06/06/18 (from the past 48 hour(s))  Glucose, capillary     Status: None   Collection Time: 06/11/18 12:20 PM  Result Value Ref Range   Glucose-Capillary 85 70 - 99 mg/dL   Comment 1 Notify RN    Comment 2 Document in Chart   Glucose, capillary     Status: Abnormal   Collection Time: 06/11/18  3:54 PM  Result Value Ref Range   Glucose-Capillary 69 (L) 70 - 99 mg/dL   Comment 1 Notify RN    Comment 2 Document in Chart   Phosphorus  Status: None   Collection Time: 06/11/18  4:47 PM  Result Value Ref Range   Phosphorus 3.4 2.5 - 4.6 mg/dL    Comment: Performed at [facility], [place] [address], [place], [place] [address]  Glucose, capillary     Status: Abnormal   Collection Time: 06/11/18  5:32 PM  Result Value Ref Range   Glucose-Capillary 104 (H) 70 - 99 mg/dL  Glucose, capillary     Status: Abnormal   Collection Time: 06/11/18  8:03 PM  Result Value Ref Range   Glucose-Capillary 134 (H) 70 - 99 mg/dL  Glucose, capillary     Status: Abnormal   Collection Time: 06/11/18 11:25 PM  Result Value Ref Range   Glucose-Capillary 142 (H) 70 - 99 mg/dL  Glucose, capillary     Status: Abnormal   Collection Time: 06/12/18  3:38 AM  Result Value Ref Range   Glucose-Capillary 159 (H) 70  - 99 mg/dL  Basic metabolic panel     Status: Abnormal   Collection Time: 06/12/18  5:59 AM  Result Value Ref Range   Sodium 139 135 - 145 mmol/L   Potassium 3.1 (L) 3.5 - 5.1 mmol/L    Comment: DELTA CHECK NOTED   Chloride 99 98 - 111 mmol/L   CO2 29 22 - 32 mmol/L   Glucose, Bld 185 (H) 70 - 99 mg/dL   BUN 15 6 - 20 mg/dL   Creatinine, Ser 0.81 0.61 - 1.24 mg/dL   Calcium 8.5 (L) 8.9 - 10.3 mg/dL   GFR calc non Af Amer >60 >60 mL/min   GFR calc Af Amer >60 >60 mL/min   Anion gap 11 5 - 15    Comment: Performed at [facility], [place] [address], [place], [place] [address]  Magnesium     Status: None   Collection Time: 06/12/18  5:59 AM  Result Value Ref Range   Magnesium 1.8 1.7 - 2.4 mg/dL    Comment: Performed at [facility] [address] [place], [place] [address]  CBC     Status: Abnormal   Collection Time: 06/12/18  5:59 AM  Result Value Ref Range   WBC 11.2 (H) 4.0 - 10.5 K/uL   RBC 3.93 (L) 4.22 - 5.81 MIL/uL   Hemoglobin 11.9 (L) 13.0 - 17.0 g/dL   HCT 35.7 (L) 39.0 - 52.0 %   MCV 90.8 80.0 - 100.0 fL   MCH 30.3 26.0 - 34.0 pg   MCHC 33.3 30.0 - 36.0 g/dL   RDW 12.9 11.5 - 15.5 %   Platelets 232 150 - 400 K/uL   nRBC 0.0 0.0 - 0.2 %    Comment: Performed at [facility], [place] [address], [place], [place] [address]  Phosphorus     Status: None   Collection Time: 06/12/18  5:59 AM  Result Value Ref Range   Phosphorus 3.0 2.5 - 4.6 mg/dL    Comment: Performed at [facility] [address] [place], [place] [address]  Glucose, capillary     Status: Abnormal   Collection Time: 06/12/18  8:06 AM  Result Value Ref Range   Glucose-Capillary 125 (H) 70 - 99 mg/dL   Comment 1 Notify RN    Comment 2 Document in Chart   Glucose, capillary     Status: Abnormal   Collection Time: 06/12/18 12:12 PM  Result Value Ref Range   Glucose-Capillary 153 (H) 70 - 99 mg/dL   Comment  1 Notify RN    Comment 2 Document in Chart   Glucose, capillary      Status: Abnormal   Collection Time: 06/12/18  4:06 PM  Result Value Ref Range   Glucose-Capillary 161 (H) 70 - 99 mg/dL   Comment 1 Notify RN    Comment 2 Document in Chart   Magnesium     Status: None   Collection Time: 06/12/18  4:44 PM  Result Value Ref Range   Magnesium 2.1 1.7 - 2.4 mg/dL    Comment: Performed at [facility] [address], [place], [place] [address]  Phosphorus     Status: None   Collection Time: 06/12/18  4:44 PM  Result Value Ref Range   Phosphorus 2.5 2.5 - 4.6 mg/dL    Comment: Performed at [facility] [address] [place], [place] [address]  Glucose, capillary     Status: Abnormal   Collection Time: 06/12/18  7:37 PM  Result Value Ref Range   Glucose-Capillary 139 (H) 70 - 99 mg/dL   Comment 1 Notify RN    Comment 2 Document in Chart   Glucose, capillary     Status: Abnormal   Collection Time: 06/12/18 11:44 PM  Result Value Ref Range   Glucose-Capillary 148 (H) 70 - 99 mg/dL   Comment 1 Notify RN    Comment 2 Document in Chart   Glucose, capillary     Status: Abnormal   Collection Time: 06/13/18  3:45 AM  Result Value Ref Range   Glucose-Capillary 111 (H) 70 - 99 mg/dL   Comment 1 Notify RN    Comment 2 Document in Chart   Basic metabolic panel     Status: Abnormal   Collection Time: 06/13/18  4:23 AM  Result Value Ref Range   Sodium 143 135 - 145 mmol/L   Potassium 3.2 (L) 3.5 - 5.1 mmol/L   Chloride 100 98 - 111 mmol/L   CO2 28 22 - 32 mmol/L   Glucose, Bld 115 (H) 70 - 99 mg/dL   BUN 16 6 - 20 mg/dL   Creatinine, Ser 1.00 0.61 - 1.24 mg/dL   Calcium 9.0 8.9 - 10.3 mg/dL   GFR calc non Af Amer >60 >60 mL/min   GFR calc Af Amer >60 >60 mL/min   Anion gap 15 5 - 15    Comment: Performed at [facility], [place] [address], [place], [place] [address]  Magnesium     Status: None   Collection Time: 06/13/18  4:23 AM  Result Value Ref Range   Magnesium 1.9 1.7 - 2.4 mg/dL    Comment: Performed at [facility] [address], [place], [place] [address]  Phosphorus     Status: None   Collection Time: 06/13/18  4:23 AM  Result Value Ref Range   Phosphorus 2.9 2.5 - 4.6 mg/dL    Comment: Performed at [facility] [address], [place], [place] [address]  Glucose, capillary     Status: Abnormal   Collection Time: 06/13/18  7:42 AM  Result Value Ref Range   Glucose-Capillary 147 (H) 70 - 99 mg/dL    ECG   N/A  Telemetry   Afib with CVR - Personally Reviewed  Radiology    No results found.  Cardiac Studies   N/A  Assessment   Principal Problem:   Stroke [facility]) Active Problems:   Type II diabetes mellitus ([place])   A-fib (  [place])   Hyperlipidemia   Hypertension   Alcohol abuse   Endotracheal tube present   Altered mental status   Encounter for orogastric (OG) tube placement   AKI (acute kidney injury) ([place])   Acute idiopathic gout of right ankle   Leukocytosis   Oropharyngeal dysphagia   Family history of lower GI bleeding   Plan   Good diuresis again overnight- will likely need oral diuretic for maintenance once he can take po. Per Dr. Erlinda Hong, restart Eliquis as of 06/16/2018 in lieu of aspirin. Replete potassium.  Time Spent Directly with Patient:  I have spent a total of 25 minutes with the patient reviewing hospital notes, telemetry, EKGs, labs and examining the patient as well as establishing an assessment and plan that was discussed personally with the patient.  > 50% of time was spent in direct patient care.  Length of Stay:  LOS: 7 days   Pixie Casino, MD, [facility], [place] Director of the Advanced Lipid Disorders &  Cardiovascular Risk Reduction Clinic Diplomate of the American Board of Clinical Lipidology Attending Cardiologist  Direct Dial: [phone]  Fax: [phone]  Website:  [email or web address]  06/13/2018, 9:52 AM

## 2018-06-13 NOTE — Progress Notes (Signed)
Nutrition Follow-up   RD working remotely.  DOCUMENTATION CODES:   Not applicable  INTERVENTION:  Discontinue Osmolite 1.5 formula.   Initiate Jevity 1.5 formula via Cortrak NGT at rate of 35 ml/hr and increase by 10 ml every 4 hours to goal rate of 55 ml/hr.  Provide 30 ml Prostat BID.   Tube feeding to provide 2180 kcal (100% of needs), 114 grams of protein, and 1103 ml free water.   NUTRITION DIAGNOSIS:   Inadequate oral intake related to dysphagia(s/p MCA stroke) as evidenced by NPO status; ongoing  GOAL:   Patient will meet greater than or equal to 90% of their needs; met with TF  MONITOR:   TF tolerance, Labs, Skin, Weight trends, I & O's  REASON FOR ASSESSMENT:   Consult, Ventilator Enteral/tube feeding initiation and management  ASSESSMENT:   55 yoM with Afib not on AC and ETOH abuse presented 5/27 with AMS found to have large subacute R MCA stroke.  5/30 extubated 6/1 Cortrak placed (gastric)   RN contacted RD regarding pt with multiple loose stool occurrence since initiation of new tube feeding formula yesterday. RD to change tube feeding formula to Jevity 1.5 formula instead which contains fiber to aid in bulking up stool. RD to continue to monitor for tolerance. Labs and medications reviewed.   Diet Order:   Diet Order            Diet NPO time specified  Diet effective now              EDUCATION NEEDS:   Not appropriate for education at this time  Skin:  Skin Assessment: Reviewed RN Assessment  Last BM:  6/3 type 7 large  Height:   Ht Readings from Last 1 Encounters:  06/07/18 5' 10"  (1.778 m)    Weight:   Wt Readings from Last 1 Encounters:  06/13/18 83.6 kg    Ideal Body Weight:  75.5 kg  BMI:  Body mass index is 26.44 kg/m.  Estimated Nutritional Needs:   Kcal:  2100-2300  Protein:  105-120 grams  Fluid:  2.1L/day    Corrin Parker, MS, RD, LDN Pager # [phone] After hours/ weekend pager # [phone]

## 2018-06-13 NOTE — Progress Notes (Signed)
No CPAP for tonight. Will address if need changes and patient will wear.

## 2018-06-13 NOTE — Progress Notes (Signed)
PROGRESS NOTE    Robert [name]  MWN:[id number] DOB: 06/03/1960 DOA: 06/06/2018 PCP: Tammi Sou, MD (Confirm with patient/family/NH records and if not entered, this HAS to be entered at [facility] point of entry. "No PCP" if truly none.)   Brief Narrative: 58 year old male with history of Afib not on AC 2/2 GIB, DMT2, PUD, HTN, HLD, gout, and ETOH abuse who presented from home on 5/27 with altered mental status and some slurred speech.  Patient lives in different rooms than his wife and last seen in normal 5/26 but she found him confused and laying on the ground. His workup showed a large sub acute right MCA stroke.  His initial CXR was negative.  Neurology was consulted and he was admitted to medical floor to hospitalist service.  He remained NPO after failing swallow study. He has had positive troponin and BNP.  EKG with afib/ non acute.   Follow-up CTH showing evolving subacute R MCA infarction on the morning of 5/28.  Later that morning, he had acute hypoxic decompensation and respiratory distress requiring NRB and transferred to ICU with PCCM consultation.  He required emergent intubation on arrival to ICU.   Assessment & Plan:   Principal Problem:   Stroke [facility]) Active Problems:   Type II diabetes mellitus ([place])   A-fib ([facility])   Hyperlipidemia   Hypertension   Alcohol abuse   Endotracheal tube present   Altered mental status   Encounter for orogastric (OG) tube placement   AKI (acute kidney injury) ([place])   Acute idiopathic gout of right ankle   Leukocytosis   Oropharyngeal dysphagia   Family history of lower GI bleeding  Subacute R MCA stroke, POA MRI unable to be performed due to metal object seen in Left eye on CT CT 5/29 large right MCA stable in size mild regional mass-effect with a 2 mm right to left shift, negative for acute large vessel occlusion 70% stenosis at the proximal right ICA.  Left carotid artery patent.  Occlusion of the right vertebral artery. Dominant left  vertebral artery patent. Echo/bubble w/o clot Neuro following - we appreciate insight and recommendations Full dose ASA/statin - likely DC asa once DOAC initiated Likely initiation of DOAC on 06/16/18 per Neuro to avoid conversion of bleed Continue PT/OT/SLP -patient may benefit from placement  Aspiration pneumonia vs. Pneumonitis with acute hypoxic respiratory failure, improving Successfully extubated 06/09/18 PO status remains questionable given above - speech continues to follow  Abx continues with augmentin for 7 day course  Obesity Discussed diet/exercise regimen as tolerated Likely benefit from sleep study  Afib/RVR, paroxysmal, rate controlled Cardiology following -Dig, ASA, statin -Switch IV metoprolol to PO once approved by speech   Encephalopathy/agitation, resolving Hx ETOH use, questionable ICU delirium given sedation and respiratory depression as above Patient appears awake alert oriented today without signs or symptoms of withdrawal Continue  thiamine/ folate/ MVI Continue Seroquel   Hx PUD/ prior GIB Distant past, to monitor for signs or symptoms of bleeding given DVT prophylaxis as above If possible bleeding in the future versus recurrent strokes, patient understands and agrees to the risk of DOAC as above  DMT2, non-insulin-dependent well-controlled a1c 5.5% Holding home medications given poor p.o. status as above Continue sliding scale insulin, hypoglycemic protocol  HLD -Statin  Gout flare - colchicine+prednisone as ordered -Patient continues to complain of right lower ankle   DVT prophylaxis: Heparin  Code Status: Full  Disposition Plan: Patient continues to be high risk for aspiration given large  MCA territory stroke with midline shift as above.  Pending PT OT evaluation and speech recommendations disposition will be discussed with patient and family he was previously independent, concern now is that patient may require SNF placement or possible  evaluation for PEG tube if cannot tolerate p.o. adequately to maintain calorie needs.  Consultants:   Neurology, cardiology, ICU  Subjective: No acute issues or events reported overnight, patient resting comfortably in bed, wife declines chest pain, shortness of breath, nausea, vomiting, diarrhea, constipation, headache, fevers, chills.  Objective: Vitals:   06/13/18 0740 06/13/18 0925 06/13/18 1233 06/13/18 1300  BP:  140/86 (!) 161/116 (!) 138/98  Pulse:  98 94   Resp:   18   Temp: 98.8 F (37.1 C)  98.6 F (37 C)   TempSrc:   Oral   SpO2:   99%   Weight:      Height:        Intake/Output Summary (Last 24 hours) at 06/13/2018 1407 Last data filed at 06/13/2018 0349 Gross per 24 hour  Intake 483 ml  Output 1700 ml  Net -1217 ml   Filed Weights   06/11/18 0500 06/12/18 0500 06/13/18 0349  Weight: 86.6 kg 83.9 kg 83.6 kg    Examination:  General:  Pleasantly resting in bed, No acute distress. HEENT:  Normocephalic atraumatic.  Sclerae nonicteric, noninjected.  Extraocular movements intact bilaterally.  NG tube intact Neck:  Without mass or deformity.  Trachea is midline. Lungs:  Clear to auscultate bilaterally without rhonchi, wheeze, or rales. Heart:  Regular rate and rhythm.  Without murmurs, rubs, or gallops. Abdomen:  Soft, nontender, nondistended.  Without guarding or rebound. Extremities: Without cyanosis, clubbing, edema, or obvious deformity. Vascular:  Dorsalis pedis and posterior tibial pulses palpable bilaterally. Skin:  Warm and dry, no erythema, no ulcerations.  Data Reviewed: I have personally reviewed following labs and imaging studies  CBC: Recent Labs  Lab 06/06/18 2051  06/08/18 0309 06/09/18 0331 06/10/18 0421 06/11/18 0554 06/12/18 0559  WBC 15.9*   < > 11.3* 10.0 10.1 8.2 11.2*  NEUTROABS 13.1*  --   --   --   --   --   --   HGB 14.2   < > 12.8* 13.0 12.3* 12.5* 11.9*  HCT 43.6   < > 38.3* 39.3 37.7* 39.1 35.7*  MCV 93.2   < > 90.3 92.9  93.8 94.2 90.8  PLT 240   < > 175 175 200 198 232   < > = values in this interval not displayed.   Basic Metabolic Panel: Recent Labs  Lab 06/09/18 0331  06/10/18 0421 06/11/18 0554 06/11/18 1647 06/12/18 0559 06/12/18 1644 06/13/18 0423  NA 141  --  143 142  --  139  --  143  K 2.9*  --  3.2* 4.0  --  3.1*  --  3.2*  CL 99  --  102 104  --  99  --  100  CO2 27  --  28 23  --  29  --  28  GLUCOSE 157*  --  137* 115*  --  185*  --  115*  BUN 12  --  9 12  --  15  --  16  CREATININE 0.82  --  0.82 0.90  --  0.81  --  1.00  CALCIUM 8.2*  --  8.4* 8.5*  --  8.5*  --  9.0  MG 1.8  --  1.7 1.9  --  1.8 2.1  1.9  PHOS 2.1*   < > 4.3 3.0 3.4 3.0 2.5 2.9   < > = values in this interval not displayed.   GFR: Estimated Creatinine Clearance: 83.1 mL/min (by C-G formula based on SCr of 1 mg/dL). Liver Function Tests: Recent Labs  Lab 06/06/18 2051 06/08/18 0309 06/08/18 1006  AST 60*  --  30  ALT 22  --  15  ALKPHOS 67  --  57  BILITOT 1.9*  --  0.6  PROT 6.7  --  5.6*  ALBUMIN 4.1 2.9* 2.9*   Recent Labs  Lab 06/06/18 2051  LIPASE 22   No results for input(s): AMMONIA in the last 168 hours. Coagulation Profile: Recent Labs  Lab 06/06/18 2051  INR 1.1   Cardiac Enzymes: Recent Labs  Lab 06/06/18 2306  06/07/18 2114 06/08/18 0309 06/09/18 0331 06/09/18 0656 06/09/18 0819 06/09/18 1413 06/09/18 1654 06/10/18 0421  CKTOTAL 2,395*  --   --  768*  --   --  439*  --   --  414*  CKMB  --   --   --   --   --   --  3.0  --   --   --   TROPONINI  --    < > 1.43*  --  2.50* 2.07*  --  2.02* 1.77*  --    < > = values in this interval not displayed.   BNP (last 3 results) No results for input(s): PROBNP in the last 8760 hours. HbA1C: No results for input(s): HGBA1C in the last 72 hours. CBG: Recent Labs  Lab 06/12/18 1937 06/12/18 2344 06/13/18 0345 06/13/18 0742 06/13/18 1232  GLUCAP 139* 148* 111* 147* 175*   Lipid Profile: No results for input(s): CHOL,  HDL, LDLCALC, TRIG, CHOLHDL, LDLDIRECT in the last 72 hours. Thyroid Function Tests: No results for input(s): TSH, T4TOTAL, FREET4, T3FREE, THYROIDAB in the last 72 hours. Anemia Panel: No results for input(s): VITAMINB12, FOLATE, FERRITIN, TIBC, IRON, RETICCTPCT in the last 72 hours. Sepsis Labs: Recent Labs  Lab 06/06/18 2212 06/07/18 1055 06/07/18 1238 06/08/18 1006 06/09/18 0331  PROCALCITON  --   --   --  0.37 0.25  LATICACIDVEN 2.5* 3.0* 3.2*  --  1.7    Recent Results (from the past 240 hour(s))  SARS Coronavirus 2 (CEPHEID - Performed in [place] hospital lab), Hosp Order     Status: None   Collection Time: 06/06/18  8:24 PM  Result Value Ref Range Status   SARS Coronavirus 2 NEGATIVE NEGATIVE Final    Comment: (NOTE) If result is NEGATIVE SARS-CoV-2 target nucleic acids are NOT DETECTED. The SARS-CoV-2 RNA is generally detectable in upper and lower  respiratory specimens during the acute phase of infection. The lowest  concentration of SARS-CoV-2 viral copies this assay can detect is 250  copies / mL. A negative result does not preclude SARS-CoV-2 infection  and should not be used as the sole basis for treatment or other  patient management decisions.  A negative result may occur with  improper specimen collection / handling, submission of specimen other  than nasopharyngeal swab, presence of viral mutation(s) within the  areas targeted by this assay, and inadequate number of viral copies  (<250 copies / mL). A negative result must be combined with clinical  observations, patient history, and epidemiological information. If result is POSITIVE SARS-CoV-2 target nucleic acids are DETECTED. The SARS-CoV-2 RNA is generally detectable in upper and lower  respiratory specimens dur ing  the acute phase of infection.  Positive  results are indicative of active infection with SARS-CoV-2.  Clinical  correlation with patient history and other diagnostic information is    necessary to determine patient infection status.  Positive results do  not rule out bacterial infection or co-infection with other viruses. If result is PRESUMPTIVE POSTIVE SARS-CoV-2 nucleic acids MAY BE PRESENT.   A presumptive positive result was obtained on the submitted specimen  and confirmed on repeat testing.  While 2019 novel coronavirus  (SARS-CoV-2) nucleic acids may be present in the submitted sample  additional confirmatory testing may be necessary for epidemiological  and / or clinical management purposes  to differentiate between  SARS-CoV-2 and other Sarbecovirus currently known to infect humans.  If clinically indicated additional testing with an alternate test  methodology [phone]) is advised. The SARS-CoV-2 RNA is generally  detectable in upper and lower respiratory sp ecimens during the acute  phase of infection. The expected result is Negative. Fact Sheet for Patients:  StrictlyIdeas.no Fact Sheet for Healthcare Providers: BankingDealers.co.za This test is not yet approved or cleared by the [place] FDA and has been authorized for detection and/or diagnosis of SARS-CoV-2 by FDA under an Emergency Use Authorization (EUA).  This EUA will remain in effect (meaning this test can be used) for the duration of the COVID-19 declaration under Section 564(b)(1) of the Act, 21 U.S.C. section 360bbb-3(b)(1), unless the authorization is terminated or revoked sooner. Performed at [facility], [place] [address], [place], [place] [address]   Culture, respiratory (non-expectorated)     Status: None   Collection Time: 06/07/18  9:37 AM  Result Value Ref Range Status   Specimen Description TRACHEAL ASPIRATE  Final   Special Requests Normal  Final   Gram Stain   Final    MODERATE WBC PRESENT, PREDOMINANTLY PMN FEW GRAM POSITIVE COCCI IN PAIRS IN CHAINS RARE GRAM NEGATIVE RODS    Culture   Final    FEW Consistent with  normal respiratory flora. Performed at [facility], [place] [address] [place], [place] [address]    Report Status 06/09/2018 FINAL  Final  MRSA PCR Screening     Status: None   Collection Time: 06/07/18  9:47 AM  Result Value Ref Range Status   MRSA by PCR NEGATIVE NEGATIVE Final    Comment:        The GeneXpert MRSA Assay (FDA approved for NASAL specimens only), is one component of a comprehensive MRSA colonization surveillance program. It is not intended to diagnose MRSA infection nor to guide or monitor treatment for MRSA infections. Performed at [facility], [place] [address] [place], [place] [address]      Radiology Studies: No results found.   Scheduled Meds:  amoxicillin-clavulanate  1 tablet Per Tube Q12H   aspirin  325 mg Per Tube Daily   Or   aspirin  300 mg Rectal Daily   atorvastatin  40 mg Per Tube q1800   chlorhexidine  15 mL Mouth Rinse BID   Chlorhexidine Gluconate Cloth  6 each Topical Daily   digoxin  0.125 mg Per Tube Daily   feeding supplement (PRO-STAT SUGAR FREE 64)  30 mL Per Tube BID   folic acid  1 mg Per Tube Daily   heparin  5,000 Units Subcutaneous Q8H   insulin aspart  0-20 Units Subcutaneous Q4H   mouth rinse  15 mL Mouth Rinse q12n4p   metoprolol tartrate  25 mg Per Tube  BID   multivitamin with minerals  1 tablet Per Tube Daily   pantoprazole sodium  40 mg Per Tube Daily   QUEtiapine  25 mg Per Tube BID   thiamine  100 mg Per Tube Daily   Continuous Infusions:  sodium chloride Stopped (06/10/18 1526)   sodium chloride 10 mL/hr at 06/12/18 1600   feeding supplement (OSMOLITE 1.5 CAL) 1,000 mL (06/12/18 1823)   lactated ringers Stopped (06/11/18 1222)     LOS: 7 days    Time spent: 36min    Jozlyn Schatz C Shalamar Crays, DO [facility] Pager [phone]  If 7PM-7AM, please contact night-coverage [email or web address] Password [email or web address] 06/13/2018, 2:07 PM

## 2018-06-13 NOTE — Progress Notes (Signed)
3x loose stools. Dr. Avon Gully informed.

## 2018-06-13 NOTE — TOC Progression Note (Signed)
Transition of Care [facility]) - Progression Note    Patient Details  Name: Robert [name] MRN: [id number] Date of Birth: [birth year]  Transition of [facility] CM/SW [place], [place] Phone Number: 06/13/2018, 5:28 PM  Clinical Narrative:    CSW spoke with patient regarding PT recommendation  of ST  rehab at SNF before returning home. Patient recognize the need for ST rehab is in agreement with SNF placement.   CSW called and updated the patient's spouse on PT recommendations. CSW emailed her Medicare.gov listing to review and select possible placement options for when the patient is medically ready for discharge.   Patient and spouse  is realistic regarding therapy needs and expressed being hopeful for SNF placement. Patient expressed understanding of CSW role and discharge process.  No questions/concerns about plan or treatment at this time.  Thurmond Butts, MSW, [facility] Clinical Social Worker [phone]   Expected Discharge Plan: Skilled Nursing Facility Barriers to Discharge: Continued Medical Work up  Expected Discharge Plan and Services Expected Discharge Plan: [place] arrangements for the past 2 months: Single Family Home                                       Social Determinants of Health (SDOH) Interventions    Readmission Risk Interventions No flowsheet data found.

## 2018-06-14 LAB — BASIC METABOLIC PANEL
Anion gap: 11 (ref 5–15)
BUN: 15 mg/dL (ref 6–20)
CO2: 27 mmol/L (ref 22–32)
Calcium: 9 mg/dL (ref 8.9–10.3)
Chloride: 103 mmol/L (ref 98–111)
Creatinine, Ser: 0.81 mg/dL (ref 0.61–1.24)
GFR calc Af Amer: >60 mL/min (ref >60)
GFR calc non Af Amer: >60 mL/min (ref >60)
Glucose, Bld: 158 mg/dL — ABNORMAL HIGH (ref 70–99)
Potassium: 3.5 mmol/L (ref 3.5–5.1)
Sodium: 141 mmol/L (ref 135–145)

## 2018-06-14 LAB — CBC
HCT: 42.5 % (ref 39.0–52.0)
Hemoglobin: 13.9 g/dL (ref 13.0–17.0)
MCH: 30.5 pg (ref 26.0–34.0)
MCHC: 32.7 g/dL (ref 30.0–36.0)
MCV: 93.2 fL (ref 80.0–100.0)
Platelets: 353 10*3/uL (ref 150–400)
RBC: 4.56 MIL/uL (ref 4.22–5.81)
RDW: 13.2 % (ref 11.5–15.5)
WBC: 11.8 10*3/uL — ABNORMAL HIGH (ref 4.0–10.5)
nRBC: 0 % (ref 0.0–0.2)

## 2018-06-14 LAB — GLUCOSE, CAPILLARY
Glucose-Capillary: 110 mg/dL — ABNORMAL HIGH (ref 70–99)
Glucose-Capillary: 116 mg/dL — ABNORMAL HIGH (ref 70–99)
Glucose-Capillary: 119 mg/dL — ABNORMAL HIGH (ref 70–99)
Glucose-Capillary: 121 mg/dL — ABNORMAL HIGH (ref 70–99)
Glucose-Capillary: 137 mg/dL — ABNORMAL HIGH (ref 70–99)
Glucose-Capillary: 138 mg/dL — ABNORMAL HIGH (ref 70–99)
Glucose-Capillary: 151 mg/dL — ABNORMAL HIGH (ref 70–99)

## 2018-06-14 LAB — MAGNESIUM: Magnesium: 1.8 mg/dL (ref 1.7–2.4)

## 2018-06-14 MED ORDER — AMOXICILLIN-POT CLAVULANATE 875-125 MG PO TABS
1.0000 | ORAL_TABLET | Freq: Two times a day (BID) | ORAL | Status: AC
  Administered 2018-06-14 – 2018-06-17 (×8): 1 via ORAL
  Filled 2018-06-14 (×8): qty 1

## 2018-06-14 NOTE — Plan of Care (Signed)

## 2018-06-14 NOTE — Progress Notes (Signed)
RT offered pt CPAP dream station for the night and pt declined stating he has not worn it in several days. RT expressed to pt that if he changes his mind to have RN call. RT will continue to monitor.

## 2018-06-14 NOTE — Progress Notes (Signed)
No further suggestions at this time. Restart Eliquis in lieu of aspirin on 6/6 if taking po adequately. Switch to po metoprolol when ok po intake. Would recommend PRN diuretic at home given cardiomyopathy. Follow-up with cardiology at [place] (Dr. Gladis Riffle).   [facility] will sign off.   Medication Recommendations:  As above Other recommendations (labs, testing, etc):  none Follow up as an outpatient:  Dr. Gladis Riffle ([facility])  Pixie Casino, MD, [facility], [place] Director of the Advanced Lipid Disorders &  Cardiovascular Risk Reduction Clinic Diplomate of the American Board of Clinical Lipidology Attending Cardiologist  Direct Dial: [phone]  Fax: [phone]  Website:  [email or web address]

## 2018-06-14 NOTE — Progress Notes (Signed)
Physical Therapy Treatment Patient Details Name: Robert [name] MRN: [id number] DOB: 10-Apr-1960 Today's Date: 06/14/2018    History of Present Illness pt is a 58 yo male adm to [facility] with large right MCA territory CVA, PMH + for ETOH use, HTN, DM2, Gi bleed, and pt developed aspiration pna and was intubated on 5/25-5/30/2020.    PT Comments    Patient seen for mobility progression. Pt is making progress toward PT goals. This session focused on bed mobility and sitting balance EOB. Pt requires +2 for bed mobility and able to progress to min guard/min A for sitting balance. Pt c/o R hip pain with mobility and dizziness with postural change. VSS. Continue to progress as tolerated.    Follow Up Recommendations  SNF     Equipment Recommendations  Other (comment)(TBD )    Recommendations for Other Services       Precautions / Restrictions Precautions Precautions: Fall Precaution Comments: NG tube Restrictions Weight Bearing Restrictions: No    Mobility  Bed Mobility Overal bed mobility: Needs Assistance Bed Mobility: Supine to Sit;Sit to Supine Rolling: Min assist   Supine to sit: Max assist;+2 for physical assistance Sit to supine: +2 for safety/equipment;Mod assist   General bed mobility comments: cues for sequencing and use of rails; assistance to bring R LE and hips to EOB and then to elevate trunk into sitting; assist to bring bilat LE into bed  Transfers                 General transfer comment: deferred due to pt's c/o dizziness and patient/therapist safety  Ambulation/Gait                 Stairs             Wheelchair Mobility    Modified Rankin (Stroke Patients Only) Modified Rankin (Stroke Patients Only) Pre-Morbid Rankin Score: Slight disability Modified Rankin: Severe disability     Balance Overall balance assessment: Needs assistance Sitting-balance support: Feet unsupported;Bilateral upper extremity supported Sitting balance-Leahy  Scale: Poor Sitting balance - Comments: progressed to min guard/min A for sitting balance EOB Postural control: Posterior lean;Right lateral lean                                  Cognition Arousal/Alertness: Awake/alert Behavior During Therapy: Anxious Overall Cognitive Status: Impaired/Different from baseline Area of Impairment: Orientation;Awareness;Safety/judgement;Problem solving;Following commands                 Orientation Level: Disoriented to;Time     Following Commands: Follows one step commands with increased time Safety/Judgement: Decreased awareness of deficits;Decreased awareness of safety Awareness: Intellectual Problem Solving: Requires verbal cues;Requires tactile cues;Slow processing General Comments: pt is very anxious about mobilizing however does want to and becomes easily distracted by noises heard outside in hallway      Exercises      General Comments General comments (skin integrity, edema, etc.): pt c/o dizziness in sitting with VSS      Pertinent Vitals/Pain Pain Assessment: Faces Faces Pain Scale: Hurts even more Pain Location: R hip with movement Pain Descriptors / Indicators: Sore;Grimacing;Guarding Pain Intervention(s): Limited activity within patient's tolerance;Monitored during session;Repositioned    Home Living                      Prior Function            PT Goals (current goals can now  be found in the care plan section) Progress towards PT goals: Progressing toward goals    Frequency    Min 2X/week      PT Plan Current plan remains appropriate    Co-evaluation              AM-PAC PT "6 Clicks" Mobility   Outcome Measure  Help needed turning from your back to your side while in a flat bed without using bedrails?: A Lot Help needed moving from lying on your back to sitting on the side of a flat bed without using bedrails?: A Lot Help needed moving to and from a bed to a chair  (including a wheelchair)?: A Lot Help needed standing up from a chair using your arms (e.g., wheelchair or bedside chair)?: A Lot Help needed to walk in hospital room?: Total Help needed climbing 3-5 steps with a railing? : Total 6 Click Score: 10    End of Session   Activity Tolerance: Patient tolerated treatment well;Patient limited by fatigue Patient left: in bed;with call bell/phone within reach;with bed alarm set Nurse Communication: Mobility status PT Visit Diagnosis: Unsteadiness on feet (R26.81);Other abnormalities of gait and mobility (R26.89);Other symptoms and signs involving the nervous system (R29.898)     Time: 1020-1041 PT Time Calculation (min) (ACUTE ONLY): 21 min  Charges:  $Therapeutic Activity: 8-22 mins                     Earney Navy, PTA Acute Rehabilitation Services Pager: [phone] Office: [phone]     Darliss Cheney 06/14/2018, 4:37 PM

## 2018-06-14 NOTE — NC FL2 (Signed)
[place] LEVEL OF CARE SCREENING TOOL     IDENTIFICATION  Patient Name: Robert [name] Birthdate: Apr 26, 1960 Sex: male Admission Date (Current Location): 06/06/2018  [facility] and [place] Number:  Herbalist and Address:  [facility] [place] [address] [place], [place] [address]      Provider Number: [id number]  Attending Physician Name and Address:  Little Ishikawa, MD  Relative Name and Phone Number:  Casimiro, Lienhard  [phone]    Current Level of Care: Hospital Recommended Level of Care: [place] Prior Approval Number:    Date Approved/Denied:   PASRR Number: [id number]  Discharge Plan: SNF    Current Diagnoses: Patient Active Problem List   Diagnosis Date Noted  . Encounter for orogastric (OG) tube placement   . AKI (acute kidney injury) ([place])   . Acute idiopathic gout of right ankle   . Leukocytosis   . Oropharyngeal dysphagia   . Family history of lower GI bleeding   . Altered mental status   . Endotracheal tube present   . Stroke ([place]) 06/06/2018  . Alcohol abuse 06/06/2018  . Hyperglycemia 08/02/2016  . Type II diabetes mellitus ([place]) 07/10/2016  . Hyperlipidemia 03/19/2015  . Hip arthritis 02/12/2015  . Acute posthemorrhagic anemia 02/24/2013  . Anemia 02/24/2013  . Hypertension 02/24/2013  . A-fib ([place]) 02/06/2013  . Tricuspid regurgitation 02/06/2013  . Non-ischemic cardiomyopathy ([place]) 02/06/2013  . Febrile 02/05/2013  . Nondependent alcohol abuse, continuous drinking behavior 02/05/2013    Orientation RESPIRATION BLADDER Height & Weight     Self, Time, Situation, Place  Normal External catheter, Incontinent Weight: 184 lb 4.9 oz (83.6 kg) Height:  5\' 10"  (177.8 cm)  BEHAVIORAL SYMPTOMS/MOOD NEUROLOGICAL BOWEL NUTRITION STATUS      Incontinent Diet(please see discharge summary )  AMBULATORY STATUS COMMUNICATION OF NEEDS Skin   Extensive Assist Verbally Skin  abrasions(abrasion;right, knee; ecchymosis, back; left posterior, lower )                       Personal Care Assistance Level of Assistance  Bathing, Feeding, Dressing Bathing Assistance: Maximum assistance Feeding assistance: Limited assistance Dressing Assistance: Maximum assistance     Functional Limitations Info  Sight, Hearing, Speech Sight Info: Adequate Hearing Info: Adequate Speech Info: Impaired    SPECIAL CARE FACTORS FREQUENCY  Speech therapy     PT Frequency: 5 x per week  OT Frequency: 5x per week     Speech Therapy Frequency: 5x per week      Contractures Contractures Info: Not present    Additional Factors Info  Psychotropic, Insulin Sliding Scale Code Status Info: FULL Allergies Info: NKA Psychotropic Info: Seroquel 25mg  2x/day Insulin Sliding Scale Info: 0-20 units every 4 hours       Current Medications (06/14/2018):  This is the current hospital active medication list Current Facility-Administered Medications  Medication Dose Route Frequency Provider Last Rate Last Dose  . 0.9 %  sodium chloride infusion  250 mL Intravenous Continuous Arnell Asal, NP   Stopped at 06/10/18 1526  . 0.9 %  sodium chloride infusion   Intravenous PRN Clydell Hakim, MD 10 mL/hr at 06/12/18 1600    . acetaminophen (TYLENOL) tablet 650 mg  650 mg Oral Q4H PRN [name], MD       Or  . acetaminophen (TYLENOL) solution 650 mg  650 mg Per Tube Q4H PRN [name], MD   650 mg at 06/12/18  2229   Or  . acetaminophen (TYLENOL) suppository 650 mg  650 mg Rectal Q4H PRN [name], MD      . amoxicillin-clavulanate (AUGMENTIN) 875-125 MG per tablet 1 tablet  1 tablet Oral Q12H Little Ishikawa, MD      . aspirin tablet 325 mg  325 mg Per Tube Daily Garvin Fila, MD   325 mg at 06/14/18 0945   Or  . aspirin suppository 300 mg  300 mg Rectal Daily Garvin Fila, MD   300 mg at 06/11/18 2585  . atorvastatin (LIPITOR) tablet 40 mg  40 mg Per Tube  q1800 Jennelle Human B, NP   40 mg at 06/13/18 1658  . bisacodyl (DULCOLAX) suppository 10 mg  10 mg Rectal Daily PRN Jennelle Human B, NP      . chlordiazePOXIDE (LIBRIUM) capsule 5 mg  5 mg Per Tube Q8H PRN Candee Furbish, MD   5 mg at 06/12/18 0910  . chlorhexidine (PERIDEX) 0.12 % solution 15 mL  15 mL Mouth Rinse BID Garvin Fila, MD   15 mL at 06/14/18 0959  . Chlorhexidine Gluconate Cloth 2 % PADS 6 each  6 each Topical Daily Greta Doom, MD   6 each at 06/12/18 1019  . cloNIDine (CATAPRES) tablet 0.1 mg  0.1 mg Per Tube Q8H PRN Candee Furbish, MD      . digoxin (LANOXIN) 0.05 MG/ML solution 0.125 mg  0.125 mg Per Tube Daily Candee Furbish, MD   0.125 mg at 06/14/18 0949  . docusate (COLACE) 50 MG/5ML liquid 100 mg  100 mg Per Tube BID PRN Jennelle Human B, NP      . feeding supplement (JEVITY 1.5 CAL/FIBER) liquid 1,000 mL  1,000 mL Per Tube Continuous Little Ishikawa, MD 55 mL/hr at 06/13/18 1815 1,000 mL at 06/13/18 1815  . feeding supplement (PRO-STAT SUGAR FREE 64) liquid 30 mL  30 mL Per Tube BID Candee Furbish, MD   30 mL at 06/14/18 0950  . folic acid (FOLVITE) tablet 1 mg  1 mg Per Tube Daily Jennelle Human B, NP   1 mg at 06/14/18 0948  . heparin injection 5,000 Units  5,000 Units Subcutaneous Q8H [name], MD   5,000 Units at 06/14/18 0527  . insulin aspart (novoLOG) injection 0-20 Units  0-20 Units Subcutaneous Q4H Candee Furbish, MD   4 Units at 06/14/18 1159  . ipratropium-albuterol (DUONEB) 0.5-2.5 (3) MG/3ML nebulizer solution 3 mL  3 mL Nebulization Q6H PRN Jennelle Human B, NP      . lactated ringers infusion   Intravenous Continuous Parrett, Tammy S, NP   Stopped at 06/11/18 1222  . MEDLINE mouth rinse  15 mL Mouth Rinse q12n4p Garvin Fila, MD   15 mL at 06/13/18 1600  . metoprolol tartrate (LOPRESSOR) 25 mg/10 mL oral suspension 25 mg  25 mg Per Tube BID Candee Furbish, MD   25 mg at 06/14/18 0949  . multivitamin with minerals tablet 1  tablet  1 tablet Per Tube Daily Jennelle Human B, NP   1 tablet at 06/14/18 0945  . pantoprazole sodium (PROTONIX) 40 mg/20 mL oral suspension 40 mg  40 mg Per Tube Daily Candee Furbish, MD   40 mg at 06/14/18 0950  . QUEtiapine (SEROQUEL) tablet 25 mg  25 mg Per Tube BID Candee Furbish, MD   25 mg at 06/14/18 0945  . senna-docusate (Senokot-S) tablet 1  tablet  1 tablet Per Tube QHS PRN Jennelle Human B, NP      . thiamine (VITAMIN B-1) tablet 100 mg  100 mg Per Tube Daily Candee Furbish, MD   100 mg at 06/14/18 0945     Discharge Medications: Please see discharge summary for a list of discharge medications.  Relevant Imaging Results:  Relevant Lab Results:   Additional Information SSN [id number]  Geralynn Ochs, [place]

## 2018-06-14 NOTE — Progress Notes (Addendum)
PROGRESS NOTE    Robert [name]  TIW:[id number] DOB: 19-Dec-1960 DOA: 06/06/2018 PCP: Tammi Sou, MD   Brief Narrative: 58 year old male with history of Afib not on AC 2/2 GIB, DMT2, PUD, HTN, HLD, gout, and ETOH abuse who presented from home on 5/27 with altered mental status and some slurred speech.  Patient lives in different rooms than his wife and last seen in normal 5/26 but she found him confused and laying on the ground. His workup showed a large sub acute right MCA stroke.  His initial CXR was negative.  Neurology was consulted and he was admitted to medical floor to hospitalist service.  He remained NPO after failing swallow study. He has had positive troponin and BNP.  EKG with afib/ non acute.   Follow-up CTH showing evolving subacute R MCA infarction on the morning of 5/28.  Later that morning, he had acute hypoxic decompensation and respiratory distress requiring NRB and transferred to ICU with PCCM consultation.  He required emergent intubation on arrival to ICU.   Assessment & Plan:   Principal Problem:   Stroke [facility]) Active Problems:   Type II diabetes mellitus ([place])   A-fib ([facility])   Hyperlipidemia   Hypertension   Alcohol abuse   Endotracheal tube present   Altered mental status   Encounter for orogastric (OG) tube placement   AKI (acute kidney injury) ([place])   Acute idiopathic gout of right ankle   Leukocytosis   Oropharyngeal dysphagia   Family history of lower GI bleeding  Subacute R MCA stroke, POA MRI unable to be performed due to metal object seen in Left eye on previous CT CT 5/29 large right MCA stable in size mild regional mass-effect with a 2 mm right to left shift, negative for acute large vessel occlusion 70% stenosis at the proximal right ICA.  Left carotid artery patent.  Occlusion of the right vertebral artery. Dominant left vertebral artery patent. Echo/bubble w/o clot Neuro previously following Full dose ASA/statin - likely DC asa once DOAC  initiated on 6/6 - PO route still yet to be established Likely initiation of DOAC on 06/16/18 per Neuro to avoid conversion of bleed Continue PT/OT - recommending placement - patient and family agreeable SLP - following - remains NPO - appreciate further recs given NG tube dependent at this point. If patient unable to tolerate PO safely will discuss possible PEG placement with IR/GI  Aspiration pneumonia vs. Pneumonitis with acute hypoxic respiratory failure, improving Successfully extubated 06/09/18 PO status remains questionable given above - speech continues to follow  Abx continues with augmentin for 7 day course  Obesity Discussed diet/exercise regimen as tolerated as patient hopefully improves strength with PT Likely benefit from sleep study  Afib/RVR, paroxysmal, rate controlled Cardiology following -Dig, ASA, statin -Switch IV metoprolol to PO once definitive PO route established   Encephalopathy/agitation, resolving Hx ETOH use, questionable ICU delirium given sedation and respiratory depression as above Patient appears awake alert oriented today without signs or symptoms of withdrawal Continue  thiamine/ folate/ MVI Continue Seroquel   Hx PUD/ prior GIB Distant past, to monitor for signs or symptoms of bleeding given DVT prophylaxis as above If possible bleeding in the future versus recurrent strokes, patient understands and agrees to the risk of DOAC as above  DMT2, non-insulin-dependent well-controlled a1c 5.5% Holding home medications given poor p.o. status as above Continue sliding scale insulin, hypoglycemic protocol  HLD -Statin  Gout flare - colchicine+prednisone as ordered -Patient continues to complain of  right lower ankle   DVT prophylaxis: Heparin  Code Status: Full  Disposition Plan: Patient continues to be high risk for aspiration given large MCA territory stroke with midline shift as above.  Likely to require SNF placement given PT/OT recs -  family aware and patient agreeable. Currently awaiting evaluation with speech. Patient may require PEG tube placement for definitive enteral feeding/meds if cannot tolerate p.o. adequately to maintain calorie needs.  Consultants:   Neurology, cardiology, ICU  Subjective: No acute issues or events reported overnight, patient resting comfortably in bed, declines chest pain, shortness of breath, nausea, vomiting, diarrhea, constipation, headache, fevers, chills.  Objective: Vitals:   06/14/18 0402 06/14/18 0600 06/14/18 0750 06/14/18 1141  BP: (!) 160/115 (!) 149/92 (!) 140/99 124/89  Pulse: 82 91 (!) 103 72  Resp: 16  16 18   Temp: 98.1 F (36.7 C)  99.3 F (37.4 C) 98.6 F (37 C)  TempSrc: Oral  Oral Oral  SpO2: 98%  93% 92%  Weight:      Height:        Intake/Output Summary (Last 24 hours) at 06/14/2018 1152 Last data filed at 06/14/2018 1036 Gross per 24 hour  Intake 90 ml  Output 677 ml  Net -587 ml   Filed Weights   06/11/18 0500 06/12/18 0500 06/13/18 0349  Weight: 86.6 kg 83.9 kg 83.6 kg    Examination:  General:  Pleasantly resting in bed, No acute distress. HEENT:  Normocephalic atraumatic.  Sclerae nonicteric, noninjected.  Extraocular movements intact bilaterally.  NG tube intact Neck:  Without mass or deformity.  Trachea is midline. Lungs:  Clear to auscultate bilaterally without rhonchi, wheeze, or rales. Heart:  Regular rate and rhythm.  Without murmurs, rubs, or gallops. Abdomen:  Soft, nontender, nondistended.  Without guarding or rebound. Extremities: Without cyanosis, clubbing, edema, or obvious deformity. Vascular:  Dorsalis pedis and posterior tibial pulses palpable bilaterally. Skin:  Warm and dry, no erythema, no ulcerations.  Data Reviewed: I have personally reviewed following labs and imaging studies  CBC: Recent Labs  Lab 06/09/18 0331 06/10/18 0421 06/11/18 0554 06/12/18 0559 06/14/18 0521  WBC 10.0 10.1 8.2 11.2* 11.8*  HGB 13.0 12.3*  12.5* 11.9* 13.9  HCT 39.3 37.7* 39.1 35.7* 42.5  MCV 92.9 93.8 94.2 90.8 93.2  PLT 175 200 198 232 419   Basic Metabolic Panel: Recent Labs  Lab 06/10/18 0421 06/11/18 0554 06/11/18 1647 06/12/18 0559 06/12/18 1644 06/13/18 0423 06/13/18 1636 06/14/18 0521  NA 143 142  --  139  --  143  --  141  K 3.2* 4.0  --  3.1*  --  3.2*  --  3.5  CL 102 104  --  99  --  100  --  103  CO2 28 23  --  29  --  28  --  27  GLUCOSE 137* 115*  --  185*  --  115*  --  158*  BUN 9 12  --  15  --  16  --  15  CREATININE 0.82 0.90  --  0.81  --  1.00  --  0.81  CALCIUM 8.4* 8.5*  --  8.5*  --  9.0  --  9.0  MG 1.7 1.9  --  1.8 2.1 1.9 1.9 1.8  PHOS 4.3 3.0 3.4 3.0 2.5 2.9  --   --    GFR: Estimated Creatinine Clearance: 102.6 mL/min (by C-G formula based on SCr of 0.81 mg/dL). Liver Function Tests: Recent Labs  Lab 06/08/18 0309 06/08/18 1006  AST  --  30  ALT  --  15  ALKPHOS  --  57  BILITOT  --  0.6  PROT  --  5.6*  ALBUMIN 2.9* 2.9*   No results for input(s): LIPASE, AMYLASE in the last 168 hours. No results for input(s): AMMONIA in the last 168 hours. Coagulation Profile: No results for input(s): INR, PROTIME in the last 168 hours. Cardiac Enzymes: Recent Labs  Lab 06/07/18 2114 06/08/18 0309 06/09/18 0331 06/09/18 0656 06/09/18 0819 06/09/18 1413 06/09/18 1654 06/10/18 0421  CKTOTAL  --  768*  --   --  439*  --   --  414*  CKMB  --   --   --   --  3.0  --   --   --   TROPONINI 1.43*  --  2.50* 2.07*  --  2.02* 1.77*  --    BNP (last 3 results) No results for input(s): PROBNP in the last 8760 hours. HbA1C: No results for input(s): HGBA1C in the last 72 hours. CBG: Recent Labs  Lab 06/13/18 2006 06/14/18 0006 06/14/18 0413 06/14/18 0802 06/14/18 1138  GLUCAP 151* 119* 137* 116* 151*   Lipid Profile: No results for input(s): CHOL, HDL, LDLCALC, TRIG, CHOLHDL, LDLDIRECT in the last 72 hours. Thyroid Function Tests: No results for input(s): TSH, T4TOTAL,  FREET4, T3FREE, THYROIDAB in the last 72 hours. Anemia Panel: No results for input(s): VITAMINB12, FOLATE, FERRITIN, TIBC, IRON, RETICCTPCT in the last 72 hours. Sepsis Labs: Recent Labs  Lab 06/07/18 1238 06/08/18 1006 06/09/18 0331  PROCALCITON  --  0.37 0.25  LATICACIDVEN 3.2*  --  1.7    Recent Results (from the past 240 hour(s))  SARS Coronavirus 2 (CEPHEID - Performed in [facility] hospital lab), Hosp Order     Status: None   Collection Time: 06/06/18  8:24 PM  Result Value Ref Range Status   SARS Coronavirus 2 NEGATIVE NEGATIVE Final    Comment: (NOTE) If result is NEGATIVE SARS-CoV-2 target nucleic acids are NOT DETECTED. The SARS-CoV-2 RNA is generally detectable in upper and lower  respiratory specimens during the acute phase of infection. The lowest  concentration of SARS-CoV-2 viral copies this assay can detect is 250  copies / mL. A negative result does not preclude SARS-CoV-2 infection  and should not be used as the sole basis for treatment or other  patient management decisions.  A negative result may occur with  improper specimen collection / handling, submission of specimen other  than nasopharyngeal swab, presence of viral mutation(s) within the  areas targeted by this assay, and inadequate number of viral copies  (<250 copies / mL). A negative result must be combined with clinical  observations, patient history, and epidemiological information. If result is POSITIVE SARS-CoV-2 target nucleic acids are DETECTED. The SARS-CoV-2 RNA is generally detectable in upper and lower  respiratory specimens dur ing the acute phase of infection.  Positive  results are indicative of active infection with SARS-CoV-2.  Clinical  correlation with patient history and other diagnostic information is  necessary to determine patient infection status.  Positive results do  not rule out bacterial infection or co-infection with other viruses. If result is PRESUMPTIVE POSTIVE  SARS-CoV-2 nucleic acids MAY BE PRESENT.   A presumptive positive result was obtained on the submitted specimen  and confirmed on repeat testing.  While 2019 novel coronavirus  (SARS-CoV-2) nucleic acids may be present in the submitted sample  additional confirmatory  testing may be necessary for epidemiological  and / or clinical management purposes  to differentiate between  SARS-CoV-2 and other Sarbecovirus currently known to infect humans.  If clinically indicated additional testing with an alternate test  methodology [phone]) is advised. The SARS-CoV-2 RNA is generally  detectable in upper and lower respiratory sp ecimens during the acute  phase of infection. The expected result is Negative. Fact Sheet for Patients:  StrictlyIdeas.no Fact Sheet for Healthcare Providers: BankingDealers.co.za This test is not yet approved or cleared by the [place] FDA and has been authorized for detection and/or diagnosis of SARS-CoV-2 by FDA under an Emergency Use Authorization (EUA).  This EUA will remain in effect (meaning this test can be used) for the duration of the COVID-19 declaration under Section 564(b)(1) of the Act, 21 U.S.C. section 360bbb-3(b)(1), unless the authorization is terminated or revoked sooner. Performed at [facility], [place] [address], [place], [place] [address]   Culture, respiratory (non-expectorated)     Status: None   Collection Time: 06/07/18  9:37 AM  Result Value Ref Range Status   Specimen Description TRACHEAL ASPIRATE  Final   Special Requests Normal  Final   Gram Stain   Final    MODERATE WBC PRESENT, PREDOMINANTLY PMN FEW GRAM POSITIVE COCCI IN PAIRS IN CHAINS RARE GRAM NEGATIVE RODS    Culture   Final    FEW Consistent with normal respiratory flora. Performed at [facility], [place] [address], [place], [place] [address]    Report Status 06/09/2018 FINAL  Final  MRSA PCR Screening     Status:  None   Collection Time: 06/07/18  9:47 AM  Result Value Ref Range Status   MRSA by PCR NEGATIVE NEGATIVE Final    Comment:        The GeneXpert MRSA Assay (FDA approved for NASAL specimens only), is one component of a comprehensive MRSA colonization surveillance program. It is not intended to diagnose MRSA infection nor to guide or monitor treatment for MRSA infections. Performed at [facility], [place] [address], [place], [place] [address]      Radiology Studies: No results found.   Scheduled Meds: . aspirin  325 mg Per Tube Daily   Or  . aspirin  300 mg Rectal Daily  . atorvastatin  40 mg Per Tube q1800  . chlorhexidine  15 mL Mouth Rinse BID  . Chlorhexidine Gluconate Cloth  6 each Topical Daily  . digoxin  0.125 mg Per Tube Daily  . feeding supplement (PRO-STAT SUGAR FREE 64)  30 mL Per Tube BID  . folic acid  1 mg Per Tube Daily  . heparin  5,000 Units Subcutaneous Q8H  . insulin aspart  0-20 Units Subcutaneous Q4H  . mouth rinse  15 mL Mouth Rinse q12n4p  . metoprolol tartrate  25 mg Per Tube BID  . multivitamin with minerals  1 tablet Per Tube Daily  . pantoprazole sodium  40 mg Per Tube Daily  . QUEtiapine  25 mg Per Tube BID  . thiamine  100 mg Per Tube Daily   Continuous Infusions: . sodium chloride Stopped (06/10/18 1526)  . sodium chloride 10 mL/hr at 06/12/18 1600  . feeding supplement (JEVITY 1.5 CAL/FIBER) 1,000 mL (06/13/18 1815)  . lactated ringers Stopped (06/11/18 1222)     LOS: 8 days    Time spent: 22min  Beulah Matusek C Kenzel Ruesch, DO [facility] Pager [phone]  If 7PM-7AM, please contact night-coverage [email or web address] Password [email or web address]  06/14/2018, 11:52 AM

## 2018-06-14 NOTE — Progress Notes (Signed)
  Speech Language Pathology Treatment: Dysphagia  Patient Details Name: Robert [name] MRN: [id number] DOB: 26-Oct-1960 Today's Date: 06/14/2018 Time: 2336-1224 SLP Time Calculation (min) (ACUTE ONLY): 25 min  Assessment / Plan / Recommendation Clinical Impression  Patient seen to address dysphagia goals with focus on ice chip trials and determination of patient readiness for MBS. Patient was significantly more intelligible with his speech today and this was also noted in MD note dated today. Patient states that he continues to feel secretions in chest. He requested SLP help him move up in bed because he was worried about safety with swallowing if he wasn't sitting more upright. He did exhibit immediate and delayed coughing and throat clearing with teaspoon sips of thin liquids, but coughing episodes were mild and brief and did not cause patient any significant distress as he did during trials last session. As patient is looking better clinically, and plan is for SNF rehab, an MBS is warranted to determine if he is able to safely consume any PO"s or if he will need more longer term alternative nutrition. Will attempt MBS next date.   HPI HPI: pt is a 58 yo male adm to [facility] with large right MCA territory CVA, PMH + for ETOH use, HTN, DM2, Gi bleed, and pt developed aspiration pna and was intubated on 5/25-5/30/2020.  Swallow eval ordered after pt extubated 5/30 at 0930.  CXR showed right basilar ATX, improving edema.        SLP Plan  Continue with current plan of care;MBS       Recommendations  Diet recommendations: NPO Medication Administration: Via alternative means                Oral Care Recommendations: Oral care QID Follow up Recommendations: Skilled Nursing facility SLP Visit Diagnosis: Dysphagia, unspecified (R13.10) Plan: Continue with current plan of care;MBS       GO              Sonia Baller, MA, [facility] Pager: [phone]

## 2018-06-15 ENCOUNTER — Inpatient Hospital Stay (HOSPITAL_COMMUNITY): Payer: BLUE CROSS/BLUE SHIELD

## 2018-06-15 LAB — GLUCOSE, CAPILLARY
Glucose-Capillary: 129 mg/dL — ABNORMAL HIGH (ref 70–99)
Glucose-Capillary: 132 mg/dL — ABNORMAL HIGH (ref 70–99)
Glucose-Capillary: 134 mg/dL — ABNORMAL HIGH (ref 70–99)
Glucose-Capillary: 138 mg/dL — ABNORMAL HIGH (ref 70–99)
Glucose-Capillary: 159 mg/dL — ABNORMAL HIGH (ref 70–99)
Glucose-Capillary: 96 mg/dL (ref 70–99)

## 2018-06-15 LAB — BASIC METABOLIC PANEL
Anion gap: 12 (ref 5–15)
BUN: 17 mg/dL (ref 6–20)
CO2: 27 mmol/L (ref 22–32)
Calcium: 9 mg/dL (ref 8.9–10.3)
Chloride: 103 mmol/L (ref 98–111)
Creatinine, Ser: 0.78 mg/dL (ref 0.61–1.24)
GFR calc Af Amer: >60 mL/min (ref >60)
GFR calc non Af Amer: >60 mL/min (ref >60)
Glucose, Bld: 115 mg/dL — ABNORMAL HIGH (ref 70–99)
Potassium: 3.3 mmol/L — ABNORMAL LOW (ref 3.5–5.1)
Sodium: 142 mmol/L (ref 135–145)

## 2018-06-15 LAB — MAGNESIUM: Magnesium: 1.8 mg/dL (ref 1.7–2.4)

## 2018-06-15 MED ORDER — RESOURCE THICKENUP CLEAR PO POWD
ORAL | Status: DC | PRN
  Filled 2018-06-15 (×2): qty 125

## 2018-06-15 MED ORDER — QUETIAPINE FUMARATE 25 MG PO TABS
25.0000 mg | ORAL_TABLET | Freq: Two times a day (BID) | ORAL | Status: DC
  Administered 2018-06-15 – 2018-06-20 (×10): 25 mg via ORAL
  Filled 2018-06-15 (×9): qty 1

## 2018-06-15 MED ORDER — METOPROLOL TARTRATE 25 MG PO TABS
25.0000 mg | ORAL_TABLET | Freq: Two times a day (BID) | ORAL | Status: DC
  Administered 2018-06-16 – 2018-06-20 (×9): 25 mg via ORAL
  Filled 2018-06-15 (×9): qty 1

## 2018-06-15 NOTE — Progress Notes (Signed)
Nutrition Follow-up   RD working remotely.  DOCUMENTATION CODES:   Not applicable  INTERVENTION:  Provide Magic cup TID with meals, each supplement provides 290 kcal and 9 grams of protein.  Encourage adequate PO intake.   Continue Jevity 1.5 formula via Cortrak NGT at to goal rate of 55 ml/hr with 30 ml Prostat BID to provide 2180 kcal (100% of needs), 114 grams of protein, and 1103 ml free water.  May discontinue tube feeding if pt consistently consuming >/= 50% at meals.   NUTRITION DIAGNOSIS:   Inadequate oral intake related to dysphagia(s/p MCA stroke) as evidenced by NPO status; diet advanced; improving  GOAL:   Patient will meet greater than or equal to 90% of their needs; met with TF  MONITOR:   PO intake, Supplement acceptance, Diet advancement, Weight trends, Labs, TF tolerance, I & O's  REASON FOR ASSESSMENT:   Consult, Ventilator Enteral/tube feeding initiation and management  ASSESSMENT:   4 yoM with Afib not on AC and ETOH abuse presented 5/27 with AMS found to have large subacute R MCA stroke. Extubated 5/30. Cortrak placed 6/1 with tip of tube in stomach.   Pt underwent MBS today. Pt with moderate anatomical dysphagia. Diet has been advanced to a dysphagia 1 diet with honey thick liquids via teaspoon only with full supervision. RD to order magic cup with meals to aid in po intake. Will continue with current tube feeding orders to ensure adequate nutrition needs are met. Plans to remove Cortrak as soon as pt safely consuming adequate amount of at least >/= 50% at meals.  Labs and medications reviewed.   Diet Order:   Diet Order            DIET - DYS 1 Room service appropriate? Yes; Fluid consistency: Honey Thick  Diet effective now              EDUCATION NEEDS:   Not appropriate for education at this time  Skin:  Skin Assessment: Reviewed RN Assessment  Last BM:  6/5- type 7 large  Height:   Ht Readings from Last 1 Encounters:  06/07/18  5' 10"  (1.778 m)    Weight:   Wt Readings from Last 1 Encounters:  06/15/18 79 kg    Ideal Body Weight:  75.5 kg  BMI:  Body mass index is 24.99 kg/m.  Estimated Nutritional Needs:   Kcal:  2100-2300  Protein:  105-120 grams  Fluid:  2.1L/day    Corrin Parker, MS, RD, LDN Pager # [phone] After hours/ weekend pager # [phone]

## 2018-06-15 NOTE — Plan of Care (Signed)

## 2018-06-15 NOTE — Progress Notes (Signed)
PROGRESS NOTE    Robert [name]  YQI:[id number] DOB: [birth year] DOA: 06/06/2018 PCP: Tammi Sou, MD   Brief Narrative: 58 year old male with history of Afib not on AC 2/2 GIB, DMT2, PUD, HTN, HLD, gout, and ETOH abuse who presented from home on 5/27 with altered mental status and some slurred speech.  Patient lives in different rooms than his wife and last seen in normal 5/26 but she found him confused and laying on the ground. His workup showed a large sub acute right MCA stroke.  His initial CXR was negative.  Neurology was consulted and he was admitted to medical floor to hospitalist service.  He remained NPO after failing swallow study. He has had positive troponin and BNP.  EKG with afib/ non acute.   Follow-up CTH showing evolving subacute R MCA infarction on the morning of 5/28.  Later that morning, he had acute hypoxic decompensation and respiratory distress requiring NRB and transferred to ICU with PCCM consultation.  He required emergent intubation on arrival to ICU.   Assessment & Plan:   Principal Problem:   Stroke [facility]) Active Problems:   Type II diabetes mellitus ([place])   A-fib (HCC)   Hyperlipidemia   Hypertension   Alcohol abuse   Endotracheal tube present   Altered mental status   Encounter for orogastric (OG) tube placement   AKI (acute kidney injury) ([place])   Acute idiopathic gout of right ankle   Leukocytosis   Oropharyngeal dysphagia   Family history of lower GI bleeding  Subacute R MCA stroke, POA MRI unable to be performed due to metal object seen in Left eye on previous CT CT 5/29 large right MCA stable in size mild regional mass-effect with a 2 mm right to left shift, negative for acute large vessel occlusion 70% stenosis at the proximal right ICA.  Left carotid artery patent.  Occlusion of the right vertebral artery. Dominant left vertebral artery patent. Echo/bubble w/o clot Neuro previously following Full dose ASA/statin - likely DC asa once DOAC  initiated on 6/6 - PO route still yet to be established Likely initiation of DOAC on 06/16/18 per Neuro to avoid conversion of bleed Continue PT/OT - recommending placement - patient and family agreeable SLP - following - remains NPO - appreciate further recs given NG tube dependent at this point. If patient unable to tolerate PO safely will discuss possible PEG placement with IR/GI  Aspiration pneumonia vs. Pneumonitis with acute hypoxic respiratory failure, improving Successfully extubated 06/09/18 Abx continues with augmentin for 7 day course Speech following, status post fluoroscopy today -recommending small(teaspoon) sized bites with pured food and honey thick liquids, crushed meds Hoping to discontinue NG tube in the next 24 hours should patient's p.o. intake increase apparently  Obesity Discussed diet/exercise regimen as tolerated as patient hopefully improves strength with PT Likely benefit from sleep study  Afib/RVR, paroxysmal, rate controlled Cardiology following -Dig, ASA, statin -Transition to crushed metoprolol p.o. as above   Encephalopathy/agitation, resolving Hx ETOH use, questionable ICU delirium given sedation and respiratory depression as above Patient appears awake alert oriented today without signs or symptoms of withdrawal Continue  thiamine/ folate/ MVI Continue Seroquel   Hx PUD/ prior GIB Distant past, to monitor for signs or symptoms of bleeding given DVT prophylaxis as above If possible bleeding in the future versus recurrent strokes, patient understands and agrees to the risk of DOAC as above  DMT2, non-insulin-dependent well-controlled a1c 5.5% Holding home medications given poor p.o. status as above Continue  sliding scale insulin, hypoglycemic protocol  HLD -Statin  Gout flare - colchicine+prednisone as ordered -Patient continues to complain of right lower ankle   DVT prophylaxis: Heparin  Code Status: Full  Disposition Plan: Patient  continues to be high risk for aspiration given large MCA territory stroke with midline shift as above.  Likely to require SNF placement given PT/OT recs - family aware and patient agreeable. Currently awaiting evaluation with speech. Patient may require PEG tube placement for definitive enteral feeding/meds if cannot tolerate p.o. adequately to maintain calorie needs.  Consultants:   Neurology, cardiology, ICU  Subjective: No acute issues or events reported overnight, patient resting comfortably in bed, declines chest pain, shortness of breath, nausea, vomiting, diarrhea, constipation, headache, fevers, chills.  Objective: Vitals:   06/15/18 1012 06/15/18 1126 06/15/18 1132 06/15/18 1605  BP: (!) 166/98 (!) 149/92  (!) 144/93  Pulse: 94 98  87  Resp:  18  18  Temp:  98.3 F (36.8 C) 98.3 F (36.8 C) 98.3 F (36.8 C)  TempSrc:  Oral Oral Oral  SpO2:  99%  93%  Weight:      Height:        Intake/Output Summary (Last 24 hours) at 06/15/2018 1734 Last data filed at 06/15/2018 1500 Gross per 24 hour  Intake 100 ml  Output 1402 ml  Net -1302 ml   Filed Weights   06/12/18 0500 06/13/18 0349 06/15/18 0500  Weight: 83.9 kg 83.6 kg 79 kg    Examination:  General:  Pleasantly resting in bed, No acute distress. HEENT:  Normocephalic atraumatic.  Sclerae nonicteric, noninjected.  Extraocular movements intact bilaterally.  NG tube intact in right nostril Neck:  Without mass or deformity.  Trachea is midline. Lungs:  Clear to auscultate bilaterally without rhonchi, wheeze, or rales. Heart:  Regular rate and rhythm.  Without murmurs, rubs, or gallops. Abdomen:  Soft, nontender, nondistended.  Without guarding or rebound. Extremities: Without cyanosis, clubbing, edema, or obvious deformity. Vascular:  Dorsalis pedis and posterior tibial pulses palpable bilaterally. Skin:  Warm and dry, no erythema, no ulcerations.  Data Reviewed: I have personally reviewed following labs and imaging  studies  CBC: Recent Labs  Lab 06/09/18 0331 06/10/18 0421 06/11/18 0554 06/12/18 0559 06/14/18 0521  WBC 10.0 10.1 8.2 11.2* 11.8*  HGB 13.0 12.3* 12.5* 11.9* 13.9  HCT 39.3 37.7* 39.1 35.7* 42.5  MCV 92.9 93.8 94.2 90.8 93.2  PLT 175 200 198 232 202   Basic Metabolic Panel: Recent Labs  Lab 06/11/18 0554 06/11/18 1647 06/12/18 0559 06/12/18 1644 06/13/18 0423 06/13/18 1636 06/14/18 0521 06/15/18 0529  NA 142  --  139  --  143  --  141 142  K 4.0  --  3.1*  --  3.2*  --  3.5 3.3*  CL 104  --  99  --  100  --  103 103  CO2 23  --  29  --  28  --  27 27  GLUCOSE 115*  --  185*  --  115*  --  158* 115*  BUN 12  --  15  --  16  --  15 17  CREATININE 0.90  --  0.81  --  1.00  --  0.81 0.78  CALCIUM 8.5*  --  8.5*  --  9.0  --  9.0 9.0  MG 1.9  --  1.8 2.1 1.9 1.9 1.8 1.8  PHOS 3.0 3.4 3.0 2.5 2.9  --   --   --  GFR: Estimated Creatinine Clearance: 103.9 mL/min (by C-G formula based on SCr of 0.78 mg/dL). Liver Function Tests: No results for input(s): AST, ALT, ALKPHOS, BILITOT, PROT, ALBUMIN in the last 168 hours. No results for input(s): LIPASE, AMYLASE in the last 168 hours. No results for input(s): AMMONIA in the last 168 hours. Coagulation Profile: No results for input(s): INR, PROTIME in the last 168 hours. Cardiac Enzymes: Recent Labs  Lab 06/09/18 0331 06/09/18 0656 06/09/18 0819 06/09/18 1413 06/09/18 1654 06/10/18 0421  CKTOTAL  --   --  439*  --   --  414*  CKMB  --   --  3.0  --   --   --   TROPONINI 2.50* 2.07*  --  2.02* 1.77*  --    BNP (last 3 results) No results for input(s): PROBNP in the last 8760 hours. HbA1C: No results for input(s): HGBA1C in the last 72 hours. CBG: Recent Labs  Lab 06/14/18 2320 06/15/18 0417 06/15/18 0809 06/15/18 1224 06/15/18 1636  GLUCAP 138* 96 138* 159* 129*   Lipid Profile: No results for input(s): CHOL, HDL, LDLCALC, TRIG, CHOLHDL, LDLDIRECT in the last 72 hours. Thyroid Function Tests: No results  for input(s): TSH, T4TOTAL, FREET4, T3FREE, THYROIDAB in the last 72 hours. Anemia Panel: No results for input(s): VITAMINB12, FOLATE, FERRITIN, TIBC, IRON, RETICCTPCT in the last 72 hours. Sepsis Labs: Recent Labs  Lab 06/09/18 0331  PROCALCITON 0.25  LATICACIDVEN 1.7    Recent Results (from the past 240 hour(s))  SARS Coronavirus 2 (CEPHEID - Performed in [facility] hospital lab), Hosp Order     Status: None   Collection Time: 06/06/18  8:24 PM  Result Value Ref Range Status   SARS Coronavirus 2 NEGATIVE NEGATIVE Final    Comment: (NOTE) If result is NEGATIVE SARS-CoV-2 target nucleic acids are NOT DETECTED. The SARS-CoV-2 RNA is generally detectable in upper and lower  respiratory specimens during the acute phase of infection. The lowest  concentration of SARS-CoV-2 viral copies this assay can detect is 250  copies / mL. A negative result does not preclude SARS-CoV-2 infection  and should not be used as the sole basis for treatment or other  patient management decisions.  A negative result may occur with  improper specimen collection / handling, submission of specimen other  than nasopharyngeal swab, presence of viral mutation(s) within the  areas targeted by this assay, and inadequate number of viral copies  (<250 copies / mL). A negative result must be combined with clinical  observations, patient history, and epidemiological information. If result is POSITIVE SARS-CoV-2 target nucleic acids are DETECTED. The SARS-CoV-2 RNA is generally detectable in upper and lower  respiratory specimens dur ing the acute phase of infection.  Positive  results are indicative of active infection with SARS-CoV-2.  Clinical  correlation with patient history and other diagnostic information is  necessary to determine patient infection status.  Positive results do  not rule out bacterial infection or co-infection with other viruses. If result is PRESUMPTIVE POSTIVE SARS-CoV-2 nucleic acids  MAY BE PRESENT.   A presumptive positive result was obtained on the submitted specimen  and confirmed on repeat testing.  While 2019 novel coronavirus  (SARS-CoV-2) nucleic acids may be present in the submitted sample  additional confirmatory testing may be necessary for epidemiological  and / or clinical management purposes  to differentiate between  SARS-CoV-2 and other Sarbecovirus currently known to infect humans.  If clinically indicated additional testing with an alternate test  methodology [phone]) is advised. The SARS-CoV-2 RNA is generally  detectable in upper and lower respiratory sp ecimens during the acute  phase of infection. The expected result is Negative. Fact Sheet for Patients:  StrictlyIdeas.no Fact Sheet for Healthcare Providers: BankingDealers.co.za This test is not yet approved or cleared by the [place] FDA and has been authorized for detection and/or diagnosis of SARS-CoV-2 by FDA under an Emergency Use Authorization (EUA).  This EUA will remain in effect (meaning this test can be used) for the duration of the COVID-19 declaration under Section 564(b)(1) of the Act, 21 U.S.C. section 360bbb-3(b)(1), unless the authorization is terminated or revoked sooner. Performed at [facility], [place] [address] [place], [place] [address]   Culture, respiratory (non-expectorated)     Status: None   Collection Time: 06/07/18  9:37 AM  Result Value Ref Range Status   Specimen Description TRACHEAL ASPIRATE  Final   Special Requests Normal  Final   Gram Stain   Final    MODERATE WBC PRESENT, PREDOMINANTLY PMN FEW GRAM POSITIVE COCCI IN PAIRS IN CHAINS RARE GRAM NEGATIVE RODS    Culture   Final    FEW Consistent with normal respiratory flora. Performed at [facility], [place] [address], [place], [place] [address]    Report Status 06/09/2018 FINAL  Final  MRSA PCR Screening     Status: None   Collection Time:  06/07/18  9:47 AM  Result Value Ref Range Status   MRSA by PCR NEGATIVE NEGATIVE Final    Comment:        The GeneXpert MRSA Assay (FDA approved for NASAL specimens only), is one component of a comprehensive MRSA colonization surveillance program. It is not intended to diagnose MRSA infection nor to guide or monitor treatment for MRSA infections. Performed at [facility], [place] [address] [place],  [address]      Radiology Studies: Dg Swallowing Func-speech Pathology  Result Date: 06/15/2018 Objective Swallowing Evaluation: Type of Study: MBS-Modified Barium Swallow Study  Patient Details Name: Robert [name] MRN: [id number] Date of Birth: [birth year] Today's Date: 06/15/2018 Time: SLP Start Time (ACUTE ONLY): 7741 -SLP Stop Time (ACUTE ONLY): 0925 SLP Time Calculation (min) (ACUTE ONLY): 21 min Past Medical History: Past Medical History: Diagnosis Date . Alcohol abuse   ongoing as of 07/2017 . Arthritis  . Atrial fibrillation ([place])   Not candidate for anticoagulation b/c of GI bleeding; had to get transfused.  Holding off on ASA until patient quits drinking. . CVA (cerebral vascular accident) ([place]) 05/2018  large R MCA territory infarct.  CTA neg for large cerebral vessel occlusion.  R ICA 70% stenosis, L clear. . Gastric ulcer   hx of . Gout   Usually MTP joint . History of digital clubbing   "all my adult life"--CXR 08/2016 = bronchitic changes o/w normal. . Hyperkalemia 04/2017  Normalized with decreasing lisinopril from 40 mg qd to 20 mg qd. . Hyperlipidemia 07/2016  Holding off on statin until pt quits drinking. Marland Kitchen Hypertension  . Nonischemic cardiomyopathy ([place])   resolved . Nonrheumatic mitral valve regurgitation  . Osteoarthritis of right hip   Scheduled for surgery 05/11/15 but he then declined to get the surgery. . Tobacco abuse   Quit 2014 . Type II diabetes mellitus ([place]) 07/2016  Dx'd by two fasting glucoses > 126 (Hb a1c 6.2% at that time).  Holding off on ASA until patient quits  drinking. Past Surgical History: Past Surgical History: Procedure Laterality Date .  CARDIOVASCULAR STRESS TEST   . CARDIOVERSION    DC . TONSILLECTOMY   HPI: pt is a 58 yo male adm to [facility] with large right MCA territory CVA, PMH + for ETOH use, HTN, DM2, Gi bleed, and pt developed aspiration pna and was intubated on 5/25-5/30/2020.  Swallow eval ordered after pt extubated 5/30 at 0930.  CXR showed right basilar ATX, improving edema.   Subjective: pt awake in bed, using oral suction Assessment / Plan / Recommendation CHL IP CLINICAL IMPRESSIONS 06/15/2018 Clinical Impression Pt exhibits a moderate primarily anatomical based dysphagia marked by laryngeal penetration deep into vestibule. His timing and motor phases were relatively adequate. Evident on fluoro and previously diagnosed large osteophytes present that extend C3-T1 preventing epiglottis from inverting, although majority of vestibule is protected during the swallow. The Cortak feeding tube exacerbates decreased deflection. There is frequent, trace, transient penetration during swallow but mostly occurs after from moderate vallecular and pyriform sinus residue that spills close to and one time briefly below the cords. He has satisfactory sensation and clears with spontaneous cough and throat clear majority of the time. Recommend pt initiate puree and honey thick liquids vis TEASPOON only, multiple swallows, hard cough after swallow. Would like Cortak removed as soon as he is safely consuming adequate amount as he will have greater protection with it removed. Full supervision.    SLP Visit Diagnosis Dysphagia, pharyngeal phase (R13.13) Attention and concentration deficit following -- Frontal lobe and executive function deficit following -- Impact on safety and function Moderate aspiration risk;Severe aspiration risk   CHL IP TREATMENT RECOMMENDATION 06/15/2018 Treatment Recommendations Therapy as outlined in treatment plan below   Prognosis 06/15/2018 Prognosis for  Safe Diet Advancement (No Data) Barriers to Reach Goals Other (Comment) Barriers/Prognosis Comment -- CHL IP DIET RECOMMENDATION 06/15/2018 SLP Diet Recommendations -- Liquid Administration via Spoon Medication Administration -- Compensations Slow rate;Small sips/bites;Minimize environmental distractions;Multiple dry swallows after each bite/sip;Hard cough after swallow Postural Changes Seated upright at 90 degrees   CHL IP OTHER RECOMMENDATIONS 06/15/2018 Recommended Consults -- Oral Care Recommendations Oral care BID Other Recommendations --   CHL IP FOLLOW UP RECOMMENDATIONS 06/15/2018 Follow up Recommendations Skilled Nursing facility   [facility] IP FREQUENCY AND DURATION 06/15/2018 Speech Therapy Frequency (ACUTE ONLY) min 2x/week Treatment Duration 2 weeks      CHL IP ORAL PHASE 06/15/2018 Oral Phase WFL Oral - Pudding Teaspoon -- Oral - Pudding Cup -- Oral - Honey Teaspoon -- Oral - Honey Cup -- Oral - Nectar Teaspoon -- Oral - Nectar Cup -- Oral - Nectar Straw -- Oral - Thin Teaspoon -- Oral - Thin Cup -- Oral - Thin Straw -- Oral - Puree -- Oral - Mech Soft -- Oral - Regular -- Oral - Multi-Consistency -- Oral - Pill -- Oral Phase - Comment --  CHL IP PHARYNGEAL PHASE 06/15/2018 Pharyngeal Phase Impaired Pharyngeal- Pudding Teaspoon -- Pharyngeal -- Pharyngeal- Pudding Cup -- Pharyngeal -- Pharyngeal- Honey Teaspoon Penetration/Aspiration during swallow;Pharyngeal residue - valleculae;Pharyngeal residue - pyriform;Reduced epiglottic inversion;Reduced pharyngeal peristalsis Pharyngeal Material enters airway, passes BELOW cords then ejected out;Material enters airway, remains ABOVE vocal cords and not ejected out;Material enters airway, CONTACTS cords and then ejected out Pharyngeal- Honey Cup -- Pharyngeal -- Pharyngeal- Nectar Teaspoon -- Pharyngeal -- Pharyngeal- Nectar Cup -- Pharyngeal -- Pharyngeal- Nectar Straw -- Pharyngeal -- Pharyngeal- Thin Teaspoon -- Pharyngeal -- Pharyngeal- Thin Cup -- Pharyngeal -- Pharyngeal-  Thin Straw -- Pharyngeal -- Pharyngeal- Puree Pharyngeal residue - valleculae;Pharyngeal residue - pyriform;Reduced pharyngeal peristalsis;Reduced  epiglottic inversion Pharyngeal -- Pharyngeal- Mechanical Soft -- Pharyngeal -- Pharyngeal- Regular -- Pharyngeal -- Pharyngeal- Multi-consistency -- Pharyngeal -- Pharyngeal- Pill -- Pharyngeal -- Pharyngeal Comment --  CHL IP CERVICAL ESOPHAGEAL PHASE 06/15/2018 Cervical Esophageal Phase WFL Pudding Teaspoon -- Pudding Cup -- Honey Teaspoon -- Honey Cup -- Nectar Teaspoon -- Nectar Cup -- Nectar Straw -- Thin Teaspoon -- Thin Cup -- Thin Straw -- Puree -- Mechanical Soft -- Regular -- Multi-consistency -- Pill -- Cervical Esophageal Comment -- Houston Siren 06/15/2018, 10:51 AM Orbie Pyo Colvin Caroli.Ed Actor Pager [phone] Office [phone]                Scheduled Meds: . amoxicillin-clavulanate  1 tablet Oral Q12H  . aspirin  325 mg Per Tube Daily   Or  . aspirin  300 mg Rectal Daily  . atorvastatin  40 mg Per Tube q1800  . chlorhexidine  15 mL Mouth Rinse BID  . Chlorhexidine Gluconate Cloth  6 each Topical Daily  . digoxin  0.125 mg Per Tube Daily  . feeding supplement (PRO-STAT SUGAR FREE 64)  30 mL Per Tube BID  . folic acid  1 mg Per Tube Daily  . heparin  5,000 Units Subcutaneous Q8H  . insulin aspart  0-20 Units Subcutaneous Q4H  . mouth rinse  15 mL Mouth Rinse q12n4p  . metoprolol tartrate  25 mg Per Tube BID  . multivitamin with minerals  1 tablet Per Tube Daily  . pantoprazole sodium  40 mg Per Tube Daily  . QUEtiapine  25 mg Per Tube BID  . thiamine  100 mg Per Tube Daily   Continuous Infusions: . sodium chloride Stopped (06/10/18 1526)  . sodium chloride 10 mL/hr at 06/12/18 1600  . feeding supplement (JEVITY 1.5 CAL/FIBER) 1,000 mL (06/14/18 1950)  . lactated ringers Stopped (06/11/18 1222)     LOS: 9 days   Time spent: 13min  Robert Musich C Shemeka Wardle, DO [facility] Pager [phone]   If 7PM-7AM, please contact night-coverage [email or web address] Password [facility] 06/15/2018, 5:34 PM

## 2018-06-15 NOTE — Progress Notes (Addendum)
Modified Barium Swallow Progress Note  Patient Details  Name: Robert [name] MRN: [id number] Date of Birth: 1960/01/13  Today's Date: 06/15/2018  Modified Barium Swallow completed.  Full report located under Chart Review in the Imaging Section.  Brief recommendations include the following:  Clinical Impression  Pt exhibits a moderate primarily anatomical based dysphagia marked by laryngeal penetration deep into vestibule. His timing and motor phases were relatively adequate. Evident on fluoro and previously diagnosed large osteophytes present that extend C3-T1 preventing epiglottis from inverting, although majority of vestibule is protected during the swallow. The Cortak feeding tube exacerbates decreased deflection. There is frequent, trace, transient penetration during swallow but mostly occurs after from moderate vallecular and pyriform sinus residue that spills close to and one time briefly below the cords. He has satisfactory sensation and clears with spontaneous cough and throat clear majority of the time. Recommend pt initiate puree and honey thick liquids vis TEASPOON only, multiple swallows, hard cough after swallow. Would like Cortak removed as soon as he is safely consuming adequate amount as he will have greater protection with it removed. Full supervision.      Swallow Evaluation Recommendations    Dys 1 (puree), honey thick liquids, crush meds           Liquid Administration via: Spoon           Compensations: Slow rate;Small sips/bites;Minimize environmental distractions;Multiple dry swallows after each bite/sip;Hard cough after swallow   Postural Changes: Seated upright at 90 degrees   Oral Care Recommendations: Oral care BID        Houston Siren 06/15/2018,10:52 AM   Orbie Pyo Colvin Caroli.Ed Risk analyst [phone] Office [phone]

## 2018-06-16 LAB — GLUCOSE, CAPILLARY
Glucose-Capillary: 142 mg/dL — ABNORMAL HIGH (ref 70–99)
Glucose-Capillary: 151 mg/dL — ABNORMAL HIGH (ref 70–99)
Glucose-Capillary: 127 mg/dL — ABNORMAL HIGH (ref 70–99)
Glucose-Capillary: 112 mg/dL — ABNORMAL HIGH (ref 70–99)

## 2018-06-16 LAB — BASIC METABOLIC PANEL
Anion gap: 11 (ref 5–15)
BUN: 18 mg/dL (ref 6–20)
CO2: 27 mmol/L (ref 22–32)
Calcium: 9 mg/dL (ref 8.9–10.3)
Chloride: 105 mmol/L (ref 98–111)
Creatinine, Ser: 0.79 mg/dL (ref 0.61–1.24)
GFR calc Af Amer: >60 mL/min (ref >60)
GFR calc non Af Amer: >60 mL/min (ref >60)
Glucose, Bld: 118 mg/dL — ABNORMAL HIGH (ref 70–99)
Potassium: 3.5 mmol/L (ref 3.5–5.1)
Sodium: 143 mmol/L (ref 135–145)

## 2018-06-16 LAB — CBC
HCT: 43.2 % (ref 39.0–52.0)
Hemoglobin: 13.9 g/dL (ref 13.0–17.0)
MCH: 30 pg (ref 26.0–34.0)
MCHC: 32.2 g/dL (ref 30.0–36.0)
MCV: 93.3 fL (ref 80.0–100.0)
Platelets: 385 10*3/uL (ref 150–400)
RBC: 4.63 MIL/uL (ref 4.22–5.81)
RDW: 13.1 % (ref 11.5–15.5)
WBC: 10.7 10*3/uL — ABNORMAL HIGH (ref 4.0–10.5)
nRBC: 0 % (ref 0.0–0.2)

## 2018-06-16 LAB — MAGNESIUM: Magnesium: 1.8 mg/dL (ref 1.7–2.4)

## 2018-06-16 MED ORDER — INSULIN ASPART 100 UNIT/ML ~~LOC~~ SOLN
0.0000 [IU] | Freq: Three times a day (TID) | SUBCUTANEOUS | Status: DC
  Administered 2018-06-16: 3 [IU] via SUBCUTANEOUS
  Administered 2018-06-18: 4 [IU] via SUBCUTANEOUS
  Administered 2018-06-18 (×3): 3 [IU] via SUBCUTANEOUS
  Administered 2018-06-19 (×2): 4 [IU] via SUBCUTANEOUS

## 2018-06-16 NOTE — Progress Notes (Signed)
SLP Cancellation Note  Patient Details Name: Robert [name] MRN: [id number] DOB: Dec 08, 1960   Cancelled treatment:       Reason Eval/Treat Not Completed: Patient at procedure or test/unavailable   Elvina Sidle, M.S.,CCC-SLP 06/16/2018, 1:26 PM

## 2018-06-16 NOTE — Progress Notes (Signed)
Patient very demanding and argumentative with staff about leaving ama, security was called and patient calmed down after an hour, and is sleeping

## 2018-06-16 NOTE — Progress Notes (Signed)
Patient has become more irritable the past 2 hours.  Patient states that the person who took pictures of him swallowing yesterday, told him that the coretrak could be removed today and that he could go home.  Patient is becoming verbally aggressive with staff members and increasing irritable because he is not at home  RN will continue to monitor patient

## 2018-06-16 NOTE — Progress Notes (Signed)
PROGRESS NOTE    Robert [name]  BHA:[id number] DOB: 1960-06-20 DOA: 06/06/2018 PCP: Tammi Sou, MD   Brief Narrative: 58 year old male with history of Afib not on AC 2/2 GIB, DMT2, PUD, HTN, HLD, gout, and ETOH abuse who presented from home on 5/27 with altered mental status and some slurred speech.  Patient lives in different rooms than his wife and last seen in normal 5/26 but she found him confused and laying on the ground. His workup showed a large sub acute right MCA stroke.  His initial CXR was negative.  Neurology was consulted and he was admitted to medical floor to hospitalist service.  He remained NPO after failing swallow study. He has had positive troponin and BNP.  EKG with afib/ non acute.   Follow-up CTH showing evolving subacute R MCA infarction on the morning of 5/28.  Later that morning, he had acute hypoxic decompensation and respiratory distress requiring NRB and transferred to ICU with PCCM consultation.  He required emergent intubation on arrival to ICU.   Assessment & Plan:   Principal Problem:   Stroke [facility]) Active Problems:   Type II diabetes mellitus ([place])   A-fib ([facility])   Hyperlipidemia   Hypertension   Alcohol abuse   Endotracheal tube present   Altered mental status   Encounter for orogastric (OG) tube placement   AKI (acute kidney injury) ([place])   Acute idiopathic gout of right ankle   Leukocytosis   Oropharyngeal dysphagia   Family history of lower GI bleeding  Subacute R MCA stroke, POA MRI unable to be performed due to metal object seen in Left eye on previous CT CT 5/29 large right MCA stable in size mild regional mass-effect with a 2 mm right to left shift, negative for acute large vessel occlusion 70% stenosis at the proximal right ICA.  Left carotid artery patent.  Occlusion of the right vertebral artery. Dominant left vertebral artery patent. Echo/bubble w/o clot Neuro previously following Full dose ASA/statin - likely DC asa once DOAC  initiated on 6/6 - PO route still yet to be established Likely initiation of DOAC on 06/16/18 per Neuro to avoid conversion of bleed Continue PT/OT - recommending placement - patient and family agreeable SLP - following -patient passed speech eval late yesterday, now advancing diet as tolerated on honey thick liquid and small pured bites crushed meds.  NG tube successfully removed, patient increasing p.o. intake lately.  Aspiration pneumonia vs. Pneumonitis with acute hypoxic respiratory failure, improving Successfully extubated 06/09/18 Abx continues with augmentin for 7 day course -last dose 06/17/2018 Speech following, status post fluoroscopy today -recommending small(teaspoon) sized bites with pured food and honey thick liquids, crushed meds NG tube removed  Obesity Discussed diet/exercise regimen as tolerated as patient hopefully improves strength with PT Likely benefit from sleep study  Afib/RVR, paroxysmal, rate controlled Cardiology following -Dig, ASA, statin -Transition to crushed metoprolol p.o. as above -Once tolerating p.o. adequately will transition to DOAC   Encephalopathy/agitation, resolving Hx ETOH use, questionable ICU delirium given sedation and respiratory depression as above Patient appears awake alert oriented today without signs or symptoms of withdrawal Continue  thiamine/ folate/ MVI Continue Seroquel   Hx PUD/ prior GIB Distant past, to monitor for signs or symptoms of bleeding given DVT prophylaxis as above If possible bleeding in the future versus recurrent strokes, patient understands and agrees to the risk of DOAC as above  DMT2, non-insulin-dependent well-controlled a1c 5.5% Holding home medications given poor p.o. status as above  Continue sliding scale insulin, hypoglycemic protocol  HLD -Statin  Gout flare - colchicine+prednisone as ordered -Patient continues to complain of right lower ankle   DVT prophylaxis: Heparin  Code Status: Full   Disposition Plan: Patient continues to be high risk for aspiration given large MCA territory stroke with midline shift as above.  Likely to require SNF placement given PT/OT recs - family aware and patient agreeable. Currently awaiting evaluation with speech. Patient may require PEG tube placement for definitive enteral feeding/meds if cannot tolerate p.o. adequately to maintain calorie needs. Patient now refusing SNF placement but family will not pick the patient up - will continue to follow along - will discuss again with the patient the need for PT/OT and rehab given the fact that he cannot walk.  Consultants:   Neurology, cardiology, ICU  Subjective: Patient continues to be somewhat combative and verbally abusive to staff this morning early and again this afternoon.  Patient demands to be sent home despite our lengthy discussion that he is unsafe for discharge and needs to go to rehab.  Patient threatened to "jump out of bed, onto the floor" -family called and we discussed the situation, family asked if we could "detain" patient which were indicated that given he is alert and oriented this is not appropriate.  Ever given family will not come pick the patient up he is essentially here until we can further discuss disposition, SNF placement remains pending.  Otherwise patient declines chest pain, shortness of breath, nausea, vomiting, diarrhea, constipation, headache, fevers, chills.  Objective: Vitals:   06/15/18 1951 06/16/18 0032 06/16/18 0518 06/16/18 0740  BP: 133/80 139/83 (!) 180/101 133/90  Pulse: (!) 113 81 (!) 108 74  Resp: 18 16 18 18   Temp: 98.4 F (36.9 C) 98.3 F (36.8 C) 98.8 F (37.1 C) 98.6 F (37 C)  TempSrc: Oral Oral Oral Oral  SpO2: 99% 96% (!) 89%   Weight:      Height:        Intake/Output Summary (Last 24 hours) at 06/16/2018 1541 Last data filed at 06/16/2018 1100 Gross per 24 hour  Intake 30 ml  Output 800 ml  Net -770 ml   Filed Weights   06/12/18 0500  06/13/18 0349 06/15/18 0500  Weight: 83.9 kg 83.6 kg 79 kg    Examination:  General:  Pleasantly resting in bed, No acute distress. HEENT:  Normocephalic atraumatic.  Sclerae nonicteric, noninjected.  Extraocular movements intact bilaterally.  NG tube intact in right nostril Neck:  Without mass or deformity.  Trachea is midline. Lungs:  Clear to auscultate bilaterally without rhonchi, wheeze, or rales. Heart:  Regular rate and rhythm.  Without murmurs, rubs, or gallops. Abdomen:  Soft, nontender, nondistended.  Without guarding or rebound. Extremities: Without cyanosis, clubbing, edema, or obvious deformity. Vascular:  Dorsalis pedis and posterior tibial pulses palpable bilaterally. Skin:  Warm and dry, no erythema, no ulcerations.  Data Reviewed: I have personally reviewed following labs and imaging studies  CBC: Recent Labs  Lab 06/10/18 0421 06/11/18 0554 06/12/18 0559 06/14/18 0521 06/16/18 0310  WBC 10.1 8.2 11.2* 11.8* 10.7*  HGB 12.3* 12.5* 11.9* 13.9 13.9  HCT 37.7* 39.1 35.7* 42.5 43.2  MCV 93.8 94.2 90.8 93.2 93.3  PLT 200 198 232 353 884   Basic Metabolic Panel: Recent Labs  Lab 06/11/18 0554 06/11/18 1647 06/12/18 0559 06/12/18 1644 06/13/18 0423 06/13/18 1636 06/14/18 0521 06/15/18 0529 06/16/18 0310  NA 142  --  139  --  143  --  141 142 143  K 4.0  --  3.1*  --  3.2*  --  3.5 3.3* 3.5  CL 104  --  99  --  100  --  103 103 105  CO2 23  --  29  --  28  --  27 27 27   GLUCOSE 115*  --  185*  --  115*  --  158* 115* 118*  BUN 12  --  15  --  16  --  15 17 18   CREATININE 0.90  --  0.81  --  1.00  --  0.81 0.78 0.79  CALCIUM 8.5*  --  8.5*  --  9.0  --  9.0 9.0 9.0  MG 1.9  --  1.8 2.1 1.9 1.9 1.8 1.8 1.8  PHOS 3.0 3.4 3.0 2.5 2.9  --   --   --   --    GFR: Estimated Creatinine Clearance: 103.9 mL/min (by C-G formula based on SCr of 0.79 mg/dL). Liver Function Tests: No results for input(s): AST, ALT, ALKPHOS, BILITOT, PROT, ALBUMIN in the last 168  hours. No results for input(s): LIPASE, AMYLASE in the last 168 hours. No results for input(s): AMMONIA in the last 168 hours. Coagulation Profile: No results for input(s): INR, PROTIME in the last 168 hours. Cardiac Enzymes: Recent Labs  Lab 06/09/18 1654 06/10/18 0421  CKTOTAL  --  414*  TROPONINI 1.77*  --    BNP (last 3 results) No results for input(s): PROBNP in the last 8760 hours. HbA1C: No results for input(s): HGBA1C in the last 72 hours. CBG: Recent Labs  Lab 06/15/18 1636 06/15/18 1952 06/15/18 2357 06/16/18 0524 06/16/18 0825  GLUCAP 129* 134* 132* 142* 151*   Lipid Profile: No results for input(s): CHOL, HDL, LDLCALC, TRIG, CHOLHDL, LDLDIRECT in the last 72 hours. Thyroid Function Tests: No results for input(s): TSH, T4TOTAL, FREET4, T3FREE, THYROIDAB in the last 72 hours. Anemia Panel: No results for input(s): VITAMINB12, FOLATE, FERRITIN, TIBC, IRON, RETICCTPCT in the last 72 hours. Sepsis Labs: No results for input(s): PROCALCITON, LATICACIDVEN in the last 168 hours.  Recent Results (from the past 240 hour(s))  SARS Coronavirus 2 (CEPHEID - Performed in [place] hospital lab), Hosp Order     Status: None   Collection Time: 06/06/18  8:24 PM  Result Value Ref Range Status   SARS Coronavirus 2 NEGATIVE NEGATIVE Final    Comment: (NOTE) If result is NEGATIVE SARS-CoV-2 target nucleic acids are NOT DETECTED. The SARS-CoV-2 RNA is generally detectable in upper and lower  respiratory specimens during the acute phase of infection. The lowest  concentration of SARS-CoV-2 viral copies this assay can detect is 250  copies / mL. A negative result does not preclude SARS-CoV-2 infection  and should not be used as the sole basis for treatment or other  patient management decisions.  A negative result may occur with  improper specimen collection / handling, submission of specimen other  than nasopharyngeal swab, presence of viral mutation(s) within the  areas  targeted by this assay, and inadequate number of viral copies  (<250 copies / mL). A negative result must be combined with clinical  observations, patient history, and epidemiological information. If result is POSITIVE SARS-CoV-2 target nucleic acids are DETECTED. The SARS-CoV-2 RNA is generally detectable in upper and lower  respiratory specimens dur ing the acute phase of infection.  Positive  results are indicative of active infection with SARS-CoV-2.  Clinical  correlation with patient history and other  diagnostic information is  necessary to determine patient infection status.  Positive results do  not rule out bacterial infection or co-infection with other viruses. If result is PRESUMPTIVE POSTIVE SARS-CoV-2 nucleic acids MAY BE PRESENT.   A presumptive positive result was obtained on the submitted specimen  and confirmed on repeat testing.  While 2019 novel coronavirus  (SARS-CoV-2) nucleic acids may be present in the submitted sample  additional confirmatory testing may be necessary for epidemiological  and / or clinical management purposes  to differentiate between  SARS-CoV-2 and other Sarbecovirus currently known to infect humans.  If clinically indicated additional testing with an alternate test  methodology [phone]) is advised. The SARS-CoV-2 RNA is generally  detectable in upper and lower respiratory sp ecimens during the acute  phase of infection. The expected result is Negative. Fact Sheet for Patients:  StrictlyIdeas.no Fact Sheet for Healthcare Providers: BankingDealers.co.za This test is not yet approved or cleared by the [place] FDA and has been authorized for detection and/or diagnosis of SARS-CoV-2 by FDA under an Emergency Use Authorization (EUA).  This EUA will remain in effect (meaning this test can be used) for the duration of the COVID-19 declaration under Section 564(b)(1) of the Act, 21 U.S.C. section  360bbb-3(b)(1), unless the authorization is terminated or revoked sooner. Performed at [facility], [place] [address], [place], [place] [address]   Culture, respiratory (non-expectorated)     Status: None   Collection Time: 06/07/18  9:37 AM  Result Value Ref Range Status   Specimen Description TRACHEAL ASPIRATE  Final   Special Requests Normal  Final   Gram Stain   Final    MODERATE WBC PRESENT, PREDOMINANTLY PMN FEW GRAM POSITIVE COCCI IN PAIRS IN CHAINS RARE GRAM NEGATIVE RODS    Culture   Final    FEW Consistent with normal respiratory flora. Performed at [facility], [place] [address] [place], [place] [address]    Report Status 06/09/2018 FINAL  Final  MRSA PCR Screening     Status: None   Collection Time: 06/07/18  9:47 AM  Result Value Ref Range Status   MRSA by PCR NEGATIVE NEGATIVE Final    Comment:        The GeneXpert MRSA Assay (FDA approved for NASAL specimens only), is one component of a comprehensive MRSA colonization surveillance program. It is not intended to diagnose MRSA infection nor to guide or monitor treatment for MRSA infections. Performed at [facility], [place] [address] [place], [place] [address]      Radiology Studies: Dg Swallowing Func-speech Pathology  Result Date: 06/15/2018 Objective Swallowing Evaluation: Type of Study: MBS-Modified Barium Swallow Study  Patient Details Name: Robert [name] MRN: [id number] Date of Birth: Feb 15, 1960 Today's Date: 06/15/2018 Time: SLP Start Time (ACUTE ONLY): 1540 -SLP Stop Time (ACUTE ONLY): 0925 SLP Time Calculation (min) (ACUTE ONLY): 21 min Past Medical History: Past Medical History: Diagnosis Date . Alcohol abuse   ongoing as of 07/2017 . Arthritis  . Atrial fibrillation ([place])   Not candidate for anticoagulation b/c of GI bleeding; had to get transfused.  Holding off on ASA until patient quits drinking. . CVA (cerebral vascular accident) ([place]) 05/2018  large R MCA territory infarct.  CTA neg for  large cerebral vessel occlusion.  R ICA 70% stenosis, L clear. . Gastric ulcer   hx of . Gout   Usually MTP joint . History of digital clubbing   "all my adult life"--CXR 08/2016 = bronchitic changes  o/w normal. . Hyperkalemia 04/2017  Normalized with decreasing lisinopril from 40 mg qd to 20 mg qd. . Hyperlipidemia 07/2016  Holding off on statin until pt quits drinking. Marland Kitchen Hypertension  . Nonischemic cardiomyopathy ([place])   resolved . Nonrheumatic mitral valve regurgitation  . Osteoarthritis of right hip   Scheduled for surgery 05/11/15 but he then declined to get the surgery. . Tobacco abuse   Quit 2014 . Type II diabetes mellitus ([place]) 07/2016  Dx'd by two fasting glucoses > 126 (Hb a1c 6.2% at that time).  Holding off on ASA until patient quits drinking. Past Surgical History: Past Surgical History: Procedure Laterality Date . CARDIOVASCULAR STRESS TEST   . CARDIOVERSION    DC . TONSILLECTOMY   HPI: pt is a 58 yo male adm to [facility] with large right MCA territory CVA, PMH + for ETOH use, HTN, DM2, Gi bleed, and pt developed aspiration pna and was intubated on 5/25-5/30/2020.  Swallow eval ordered after pt extubated 5/30 at 0930.  CXR showed right basilar ATX, improving edema.   Subjective: pt awake in bed, using oral suction Assessment / Plan / Recommendation CHL IP CLINICAL IMPRESSIONS 06/15/2018 Clinical Impression Pt exhibits a moderate primarily anatomical based dysphagia marked by laryngeal penetration deep into vestibule. His timing and motor phases were relatively adequate. Evident on fluoro and previously diagnosed large osteophytes present that extend C3-T1 preventing epiglottis from inverting, although majority of vestibule is protected during the swallow. The Cortak feeding tube exacerbates decreased deflection. There is frequent, trace, transient penetration during swallow but mostly occurs after from moderate vallecular and pyriform sinus residue that spills close to and one time briefly below the cords. He  has satisfactory sensation and clears with spontaneous cough and throat clear majority of the time. Recommend pt initiate puree and honey thick liquids vis TEASPOON only, multiple swallows, hard cough after swallow. Would like Cortak removed as soon as he is safely consuming adequate amount as he will have greater protection with it removed. Full supervision.    SLP Visit Diagnosis Dysphagia, pharyngeal phase (R13.13) Attention and concentration deficit following -- Frontal lobe and executive function deficit following -- Impact on safety and function Moderate aspiration risk;Severe aspiration risk   CHL IP TREATMENT RECOMMENDATION 06/15/2018 Treatment Recommendations Therapy as outlined in treatment plan below   Prognosis 06/15/2018 Prognosis for Safe Diet Advancement (No Data) Barriers to Reach Goals Other (Comment) Barriers/Prognosis Comment -- CHL IP DIET RECOMMENDATION 06/15/2018 SLP Diet Recommendations -- Liquid Administration via Spoon Medication Administration -- Compensations Slow rate;Small sips/bites;Minimize environmental distractions;Multiple dry swallows after each bite/sip;Hard cough after swallow Postural Changes Seated upright at 90 degrees   CHL IP OTHER RECOMMENDATIONS 06/15/2018 Recommended Consults -- Oral Care Recommendations Oral care BID Other Recommendations --   CHL IP FOLLOW UP RECOMMENDATIONS 06/15/2018 Follow up Recommendations Skilled Nursing facility   [facility] IP FREQUENCY AND DURATION 06/15/2018 Speech Therapy Frequency (ACUTE ONLY) min 2x/week Treatment Duration 2 weeks      CHL IP ORAL PHASE 06/15/2018 Oral Phase WFL Oral - Pudding Teaspoon -- Oral - Pudding Cup -- Oral - Honey Teaspoon -- Oral - Honey Cup -- Oral - Nectar Teaspoon -- Oral - Nectar Cup -- Oral - Nectar Straw -- Oral - Thin Teaspoon -- Oral - Thin Cup -- Oral - Thin Straw -- Oral - Puree -- Oral - Mech Soft -- Oral - Regular -- Oral - Multi-Consistency -- Oral - Pill -- Oral Phase - Comment --  CHL IP PHARYNGEAL PHASE 06/15/2018  Pharyngeal Phase Impaired Pharyngeal- Pudding Teaspoon -- Pharyngeal -- Pharyngeal- Pudding Cup -- Pharyngeal -- Pharyngeal- Honey Teaspoon Penetration/Aspiration during swallow;Pharyngeal residue - valleculae;Pharyngeal residue - pyriform;Reduced epiglottic inversion;Reduced pharyngeal peristalsis Pharyngeal Material enters airway, passes BELOW cords then ejected out;Material enters airway, remains ABOVE vocal cords and not ejected out;Material enters airway, CONTACTS cords and then ejected out Pharyngeal- Honey Cup -- Pharyngeal -- Pharyngeal- Nectar Teaspoon -- Pharyngeal -- Pharyngeal- Nectar Cup -- Pharyngeal -- Pharyngeal- Nectar Straw -- Pharyngeal -- Pharyngeal- Thin Teaspoon -- Pharyngeal -- Pharyngeal- Thin Cup -- Pharyngeal -- Pharyngeal- Thin Straw -- Pharyngeal -- Pharyngeal- Puree Pharyngeal residue - valleculae;Pharyngeal residue - pyriform;Reduced pharyngeal peristalsis;Reduced epiglottic inversion Pharyngeal -- Pharyngeal- Mechanical Soft -- Pharyngeal -- Pharyngeal- Regular -- Pharyngeal -- Pharyngeal- Multi-consistency -- Pharyngeal -- Pharyngeal- Pill -- Pharyngeal -- Pharyngeal Comment --  CHL IP CERVICAL ESOPHAGEAL PHASE 06/15/2018 Cervical Esophageal Phase WFL Pudding Teaspoon -- Pudding Cup -- Honey Teaspoon -- Honey Cup -- Nectar Teaspoon -- Nectar Cup -- Nectar Straw -- Thin Teaspoon -- Thin Cup -- Thin Straw -- Puree -- Mechanical Soft -- Regular -- Multi-consistency -- Pill -- Cervical Esophageal Comment -- [place] Siren 06/15/2018, 10:51 AM Orbie Pyo Colvin Caroli.Ed Actor Pager [phone] Office [phone]                Scheduled Meds: . amoxicillin-clavulanate  1 tablet Oral Q12H  . aspirin  325 mg Per Tube Daily  . atorvastatin  40 mg Per Tube q1800  . chlorhexidine  15 mL Mouth Rinse BID  . digoxin  0.125 mg Per Tube Daily  . feeding supplement (PRO-STAT SUGAR FREE 64)  30 mL Per Tube BID  . folic acid  1 mg Per Tube Daily  . heparin  5,000  Units Subcutaneous Q8H  . insulin aspart  0-20 Units Subcutaneous TID AC & HS  . mouth rinse  15 mL Mouth Rinse q12n4p  . metoprolol tartrate  25 mg Oral BID  . multivitamin with minerals  1 tablet Per Tube Daily  . pantoprazole sodium  40 mg Per Tube Daily  . QUEtiapine  25 mg Oral BID  . thiamine  100 mg Per Tube Daily   Continuous Infusions: . sodium chloride Stopped (06/10/18 1526)  . sodium chloride 10 mL/hr at 06/12/18 1600  . feeding supplement (JEVITY 1.5 CAL/FIBER) 1,000 mL (06/15/18 2241)  . lactated ringers Stopped (06/11/18 1222)     LOS: 10 days   Time spent: 83min  Amit Leece C Dewana Ammirati, DO [facility] Pager [phone]  If 7PM-7AM, please contact night-coverage [email or web address] Password [email or web address] 06/16/2018, 3:41 PM

## 2018-06-17 LAB — GLUCOSE, CAPILLARY
Glucose-Capillary: 101 mg/dL — ABNORMAL HIGH (ref 70–99)
Glucose-Capillary: 115 mg/dL — ABNORMAL HIGH (ref 70–99)
Glucose-Capillary: 117 mg/dL — ABNORMAL HIGH (ref 70–99)
Glucose-Capillary: 130 mg/dL — ABNORMAL HIGH (ref 70–99)
Glucose-Capillary: 97 mg/dL (ref 70–99)
Glucose-Capillary: 98 mg/dL (ref 70–99)

## 2018-06-17 MED ORDER — APIXABAN 5 MG PO TABS
5.0000 mg | ORAL_TABLET | Freq: Two times a day (BID) | ORAL | Status: DC
  Administered 2018-06-17 – 2018-06-20 (×7): 5 mg via ORAL
  Filled 2018-06-17 (×7): qty 1

## 2018-06-17 NOTE — Progress Notes (Signed)
CSW called and spoke with the patient's wife and gave updated bed offers. The patient's wife wanted to look up the facilities and make the decision. CSW will contact the patient's wife in the morning to get a facility placement.   CSW will continue to follow and assist with discharge.   Domenic Schwab, MSW, [place]

## 2018-06-17 NOTE — Progress Notes (Signed)
PROGRESS NOTE    Robert [name]  GUY:[id number] DOB: 08/22/1960 DOA: 06/06/2018 PCP: Tammi Sou, MD   Brief Narrative: 58 year old male with history of Afib not on AC 2/2 GIB, DMT2, PUD, HTN, HLD, gout, and ETOH abuse who presented from home on 5/27 with altered mental status and some slurred speech.  Patient lives in different rooms than his wife and last seen in normal 5/26 but she found him confused and laying on the ground. His workup showed a large subacute right MCA stroke.  His initial CXR was negative.  Neurology was consulted and he was admitted to medical floor to hospitalist service.  He remained NPO after failing swallow study. He has had positive troponin and BNP.  EKG with afib/ non-acute.   Follow-up CTH showing evolving subacute R MCA infarction on the morning of 5/28.  Later that morning, he had acute hypoxic decompensation and respiratory distress requiring NRB and transferred to ICU with PCCM consultation.  He required emergent intubation on arrival to ICU.   Assessment & Plan:   Principal Problem:   Stroke [facility]) Active Problems:   Type II diabetes mellitus ([place])   A-fib (HCC)   Hyperlipidemia   Hypertension   Alcohol abuse   Endotracheal tube present   Altered mental status   Encounter for orogastric (OG) tube placement   AKI (acute kidney injury) ([place])   Acute idiopathic gout of right ankle   Leukocytosis   Oropharyngeal dysphagia   Family history of lower GI bleeding  Subacute R MCA stroke, POA MRI unable to be performed due to metal object seen in Left eye on previous CT CT 5/29 large right MCA stable in size mild regional mass-effect with a 2 mm right to left shift, negative for acute large vessel occlusion 70% stenosis at the proximal right ICA.  Left carotid artery patent.  Occlusion of the right vertebral artery. Dominant left vertebral artery patent. Echo/bubble w/o clot Neuro previously following Transition to apixaban; continue statin - DC ASA;  delayed transition per Neuro to avoid conversion of bleed Continue PT/OT - recommending placement - family agreeable; patient continues to be somewhat assistant to SNF placement remains essentially nonambulatory at this point SLP - following -NG removed - tolerating PO quite well over the past 24h.  Aspiration pneumonia vs. Pneumonitis with acute hypoxic respiratory failure, improving Successfully extubated 06/09/18 Augmentin completed 7 day course -last dose 06/17/2018 Speech following - recommending small(teaspoon) sized bites with pured food and honey thick liquids, crushed meds NG tube removed 06/16/18  Obesity Discussed diet/exercise regimen as tolerated as patient hopefully improves strength with PT Likely benefit from sleep study  Afib/RVR, paroxysmal, rate controlled Cardiology following -Dig, ASA, statin -Transition to crushed metoprolol p.o. as above -Transition to apixaban twice daily as above   Encephalopathy/agitation, resolving Hx ETOH use, questionable ICU delirium given sedation and respiratory depression as above Patient appears awake alert oriented today without signs or symptoms of withdrawal Continue  thiamine/ folate/ MVI Continue Seroquel   Hx PUD/ prior GIB Distant past, to monitor for signs or symptoms of bleeding given DVT prophylaxis as above If possible bleeding in the future versus recurrent strokes, patient understands and agrees to the risk of DOAC as above  DMT2, non-insulin-dependent well-controlled a1c 5.5% Holding home medications Continue sliding scale insulin, hypoglycemic protocol  HLD -Continue statin  Gout flare, resolving -Colchicine+prednisone previously ordered - never received due to NPO status previously -Pain markedly improving - hold off on further treatment unless symptomatic  DVT prophylaxis: Heparin  Code Status: Full  Disposition Plan: Patient continues to be high risk for aspiration given large MCA territory stroke with  midline shift as above.  Likely to require SNF placement given PT/OT recs - family aware. Patient awaiting SNF placement - will continue to follow along - will discuss again with the patient the need for PT/OT and rehab given the fact that he cannot walk or perform ADLs at home.  Consultants:   Neurology, cardiology, ICU  Subjective: Patient declines chest pain, shortness of breath, nausea, vomiting, diarrhea, constipation, headache, fevers, chills.  Objective: Vitals:   06/16/18 1950 06/17/18 0000 06/17/18 0305 06/17/18 0400  BP: (!) 145/58 130/89  (!) 122/94  Pulse: 94 87  75  Resp: 20 15  18   Temp: 98.8 F (37.1 C) 98.8 F (37.1 C)  98.2 F (36.8 C)  TempSrc: Oral Oral  Oral  SpO2: 94% 96%  99%  Weight:   76.8 kg   Height:        Intake/Output Summary (Last 24 hours) at 06/17/2018 0717 Last data filed at 06/16/2018 2214 Gross per 24 hour  Intake 30 ml  Output 2000 ml  Net -1970 ml   Filed Weights   06/13/18 0349 06/15/18 0500 06/17/18 0305  Weight: 83.6 kg 79 kg 76.8 kg    Examination:  General:  Pleasantly resting in bed, No acute distress. HEENT:  Normocephalic atraumatic.  Sclerae nonicteric, noninjected.  Extraocular movements intact bilaterally.  NG tube intact in right nostril Neck:  Without mass or deformity.  Trachea is midline. Lungs:  Clear to auscultate bilaterally without rhonchi, wheeze, or rales. Heart:  Regular rate and rhythm.  Without murmurs, rubs, or gallops. Abdomen:  Soft, nontender, nondistended.  Without guarding or rebound. Extremities: Without cyanosis, clubbing, edema, or obvious deformity. Vascular:  Dorsalis pedis and posterior tibial pulses palpable bilaterally. Skin:  Warm and dry, no erythema, no ulcerations.  Data Reviewed: I have personally reviewed following labs and imaging studies  CBC: Recent Labs  Lab 06/11/18 0554 06/12/18 0559 06/14/18 0521 06/16/18 0310  WBC 8.2 11.2* 11.8* 10.7*  HGB 12.5* 11.9* 13.9 13.9  HCT 39.1  35.7* 42.5 43.2  MCV 94.2 90.8 93.2 93.3  PLT 198 232 353 381   Basic Metabolic Panel: Recent Labs  Lab 06/11/18 0554 06/11/18 1647 06/12/18 0559 06/12/18 1644 06/13/18 0423 06/13/18 1636 06/14/18 0521 06/15/18 0529 06/16/18 0310  NA 142  --  139  --  143  --  141 142 143  K 4.0  --  3.1*  --  3.2*  --  3.5 3.3* 3.5  CL 104  --  99  --  100  --  103 103 105  CO2 23  --  29  --  28  --  27 27 27   GLUCOSE 115*  --  185*  --  115*  --  158* 115* 118*  BUN 12  --  15  --  16  --  15 17 18   CREATININE 0.90  --  0.81  --  1.00  --  0.81 0.78 0.79  CALCIUM 8.5*  --  8.5*  --  9.0  --  9.0 9.0 9.0  MG 1.9  --  1.8 2.1 1.9 1.9 1.8 1.8 1.8  PHOS 3.0 3.4 3.0 2.5 2.9  --   --   --   --    GFR: Estimated Creatinine Clearance: 103.9 mL/min (by C-G formula based on SCr of 0.79 mg/dL). Liver Function  Tests: No results for input(s): AST, ALT, ALKPHOS, BILITOT, PROT, ALBUMIN in the last 168 hours. No results for input(s): LIPASE, AMYLASE in the last 168 hours. No results for input(s): AMMONIA in the last 168 hours. Coagulation Profile: No results for input(s): INR, PROTIME in the last 168 hours. Cardiac Enzymes: No results for input(s): CKTOTAL, CKMB, CKMBINDEX, TROPONINI in the last 168 hours. BNP (last 3 results) No results for input(s): PROBNP in the last 8760 hours. HbA1C: No results for input(s): HGBA1C in the last 72 hours. CBG: Recent Labs  Lab 06/16/18 0524 06/16/18 0825 06/16/18 1715 06/16/18 2104 06/17/18 0609  GLUCAP 142* 151* 127* 112* 97   Lipid Profile: No results for input(s): CHOL, HDL, LDLCALC, TRIG, CHOLHDL, LDLDIRECT in the last 72 hours. Thyroid Function Tests: No results for input(s): TSH, T4TOTAL, FREET4, T3FREE, THYROIDAB in the last 72 hours. Anemia Panel: No results for input(s): VITAMINB12, FOLATE, FERRITIN, TIBC, IRON, RETICCTPCT in the last 72 hours. Sepsis Labs: No results for input(s): PROCALCITON, LATICACIDVEN in the last 168 hours.  Recent  Results (from the past 240 hour(s))  Culture, respiratory (non-expectorated)     Status: None   Collection Time: 06/07/18  9:37 AM  Result Value Ref Range Status   Specimen Description TRACHEAL ASPIRATE  Final   Special Requests Normal  Final   Gram Stain   Final    MODERATE WBC PRESENT, PREDOMINANTLY PMN FEW GRAM POSITIVE COCCI IN PAIRS IN CHAINS RARE GRAM NEGATIVE RODS    Culture   Final    FEW Consistent with normal respiratory flora. Performed at [facility], [place] [address], [place], [place] [address]    Report Status 06/09/2018 FINAL  Final  MRSA PCR Screening     Status: None   Collection Time: 06/07/18  9:47 AM  Result Value Ref Range Status   MRSA by PCR NEGATIVE NEGATIVE Final    Comment:        The GeneXpert MRSA Assay (FDA approved for NASAL specimens only), is one component of a comprehensive MRSA colonization surveillance program. It is not intended to diagnose MRSA infection nor to guide or monitor treatment for MRSA infections. Performed at [facility], [place] [address] [place], [place] [address]      Radiology Studies: Dg Swallowing Func-speech Pathology  Result Date: 06/15/2018 Objective Swallowing Evaluation: Type of Study: MBS-Modified Barium Swallow Study  Patient Details Name: Othello Dickenson MRN: [id number] Date of Birth: 08/12/1960 Today's Date: 06/15/2018 Time: SLP Start Time (ACUTE ONLY): 2951 -SLP Stop Time (ACUTE ONLY): 0925 SLP Time Calculation (min) (ACUTE ONLY): 21 min Past Medical History: Past Medical History: Diagnosis Date . Alcohol abuse   ongoing as of 07/2017 . Arthritis  . Atrial fibrillation ([place])   Not candidate for anticoagulation b/c of GI bleeding; had to get transfused.  Holding off on ASA until patient quits drinking. . CVA (cerebral vascular accident) ([place]) 05/2018  large R MCA territory infarct.  CTA neg for large cerebral vessel occlusion.  R ICA 70% stenosis, L clear. . Gastric ulcer   hx of . Gout   Usually MTP joint .  History of digital clubbing   "all my adult life"--CXR 08/2016 = bronchitic changes o/w normal. . Hyperkalemia 04/2017  Normalized with decreasing lisinopril from 40 mg qd to 20 mg qd. . Hyperlipidemia 07/2016  Holding off on statin until pt quits drinking. Marland Kitchen Hypertension  . Nonischemic cardiomyopathy ([place])   resolved . Nonrheumatic mitral valve regurgitation  . Osteoarthritis of right  hip   Scheduled for surgery 05/11/15 but he then declined to get the surgery. . Tobacco abuse   Quit 2014 . Type II diabetes mellitus ([place]) 07/2016  Dx'd by two fasting glucoses > 126 (Hb a1c 6.2% at that time).  Holding off on ASA until patient quits drinking. Past Surgical History: Past Surgical History: Procedure Laterality Date . CARDIOVASCULAR STRESS TEST   . CARDIOVERSION    DC . TONSILLECTOMY   HPI: pt is a 58 yo male adm to [facility] with large right MCA territory CVA, PMH + for ETOH use, HTN, DM2, Gi bleed, and pt developed aspiration pna and was intubated on 5/25-5/30/2020.  Swallow eval ordered after pt extubated 5/30 at 0930.  CXR showed right basilar ATX, improving edema.   Subjective: pt awake in bed, using oral suction Assessment / Plan / Recommendation CHL IP CLINICAL IMPRESSIONS 06/15/2018 Clinical Impression Pt exhibits a moderate primarily anatomical based dysphagia marked by laryngeal penetration deep into vestibule. His timing and motor phases were relatively adequate. Evident on fluoro and previously diagnosed large osteophytes present that extend C3-T1 preventing epiglottis from inverting, although majority of vestibule is protected during the swallow. The Cortak feeding tube exacerbates decreased deflection. There is frequent, trace, transient penetration during swallow but mostly occurs after from moderate vallecular and pyriform sinus residue that spills close to and one time briefly below the cords. He has satisfactory sensation and clears with spontaneous cough and throat clear majority of the time. Recommend pt  initiate puree and honey thick liquids vis TEASPOON only, multiple swallows, hard cough after swallow. Would like Cortak removed as soon as he is safely consuming adequate amount as he will have greater protection with it removed. Full supervision.    SLP Visit Diagnosis Dysphagia, pharyngeal phase (R13.13) Attention and concentration deficit following -- Frontal lobe and executive function deficit following -- Impact on safety and function Moderate aspiration risk;Severe aspiration risk   CHL IP TREATMENT RECOMMENDATION 06/15/2018 Treatment Recommendations Therapy as outlined in treatment plan below   Prognosis 06/15/2018 Prognosis for Safe Diet Advancement (No Data) Barriers to Reach Goals Other (Comment) Barriers/Prognosis Comment -- CHL IP DIET RECOMMENDATION 06/15/2018 SLP Diet Recommendations -- Liquid Administration via Spoon Medication Administration -- Compensations Slow rate;Small sips/bites;Minimize environmental distractions;Multiple dry swallows after each bite/sip;Hard cough after swallow Postural Changes Seated upright at 90 degrees   CHL IP OTHER RECOMMENDATIONS 06/15/2018 Recommended Consults -- Oral Care Recommendations Oral care BID Other Recommendations --   CHL IP FOLLOW UP RECOMMENDATIONS 06/15/2018 Follow up Recommendations Skilled Nursing facility   [facility] IP FREQUENCY AND DURATION 06/15/2018 Speech Therapy Frequency (ACUTE ONLY) min 2x/week Treatment Duration 2 weeks      CHL IP ORAL PHASE 06/15/2018 Oral Phase WFL Oral - Pudding Teaspoon -- Oral - Pudding Cup -- Oral - Honey Teaspoon -- Oral - Honey Cup -- Oral - Nectar Teaspoon -- Oral - Nectar Cup -- Oral - Nectar Straw -- Oral - Thin Teaspoon -- Oral - Thin Cup -- Oral - Thin Straw -- Oral - Puree -- Oral - Mech Soft -- Oral - Regular -- Oral - Multi-Consistency -- Oral - Pill -- Oral Phase - Comment --  CHL IP PHARYNGEAL PHASE 06/15/2018 Pharyngeal Phase Impaired Pharyngeal- Pudding Teaspoon -- Pharyngeal -- Pharyngeal- Pudding Cup -- Pharyngeal --  Pharyngeal- Honey Teaspoon Penetration/Aspiration during swallow;Pharyngeal residue - valleculae;Pharyngeal residue - pyriform;Reduced epiglottic inversion;Reduced pharyngeal peristalsis Pharyngeal Material enters airway, passes BELOW cords then ejected out;Material enters airway, remains ABOVE vocal cords and not ejected  out;Material enters airway, CONTACTS cords and then ejected out Pharyngeal- Honey Cup -- Pharyngeal -- Pharyngeal- Nectar Teaspoon -- Pharyngeal -- Pharyngeal- Nectar Cup -- Pharyngeal -- Pharyngeal- Nectar Straw -- Pharyngeal -- Pharyngeal- Thin Teaspoon -- Pharyngeal -- Pharyngeal- Thin Cup -- Pharyngeal -- Pharyngeal- Thin Straw -- Pharyngeal -- Pharyngeal- Puree Pharyngeal residue - valleculae;Pharyngeal residue - pyriform;Reduced pharyngeal peristalsis;Reduced epiglottic inversion Pharyngeal -- Pharyngeal- Mechanical Soft -- Pharyngeal -- Pharyngeal- Regular -- Pharyngeal -- Pharyngeal- Multi-consistency -- Pharyngeal -- Pharyngeal- Pill -- Pharyngeal -- Pharyngeal Comment --  CHL IP CERVICAL ESOPHAGEAL PHASE 06/15/2018 Cervical Esophageal Phase WFL Pudding Teaspoon -- Pudding Cup -- Honey Teaspoon -- Honey Cup -- Nectar Teaspoon -- Nectar Cup -- Nectar Straw -- Thin Teaspoon -- Thin Cup -- Thin Straw -- Puree -- Mechanical Soft -- Regular -- Multi-consistency -- Pill -- Cervical Esophageal Comment -- Houston Siren 06/15/2018, 10:51 AM Orbie Pyo Colvin Caroli.Ed Actor Pager [phone] Office [phone]                Scheduled Meds: . amoxicillin-clavulanate  1 tablet Oral Q12H  . aspirin  325 mg Per Tube Daily  . atorvastatin  40 mg Per Tube q1800  . chlorhexidine  15 mL Mouth Rinse BID  . digoxin  0.125 mg Per Tube Daily  . feeding supplement (PRO-STAT SUGAR FREE 64)  30 mL Per Tube BID  . folic acid  1 mg Per Tube Daily  . heparin  5,000 Units Subcutaneous Q8H  . insulin aspart  0-20 Units Subcutaneous TID AC & HS  . mouth rinse  15 mL Mouth Rinse  q12n4p  . metoprolol tartrate  25 mg Oral BID  . multivitamin with minerals  1 tablet Per Tube Daily  . pantoprazole sodium  40 mg Per Tube Daily  . QUEtiapine  25 mg Oral BID  . thiamine  100 mg Per Tube Daily   Continuous Infusions: . sodium chloride Stopped (06/10/18 1526)  . sodium chloride 10 mL/hr at 06/12/18 1600  . feeding supplement (JEVITY 1.5 CAL/FIBER) 1,000 mL (06/15/18 2241)  . lactated ringers Stopped (06/11/18 1222)     LOS: 11 days   Time spent: 55min  William C Lancaster, DO [facility] Pager [phone]  If 7PM-7AM, please contact night-coverage [email or web address] Password [email or web address] 06/17/2018, 7:17 AM

## 2018-06-17 NOTE — Progress Notes (Signed)
Occupational Therapy Treatment Patient Details Name: Robert [name] MRN: [id number] DOB: Jul 04, 1960 Today's Date: 06/17/2018    History of present illness pt is a 58 yo male adm to [facility] with large right MCA territory CVA, PMH + for ETOH use, HTN, DM2, Gi bleed, and pt developed aspiration pna and was intubated on 5/25-5/30/2020.   OT comments  PATIENT WAS AGREEABLE TO WORKING WITH OT. PATIENT NEEDED ENCOURAGEMENT TO SIT EOB SECONDARY TO FEAR OF FALLING. PATIENT WILL NEED CONTINUED OT SERVICES.   Follow Up Recommendations       Equipment Recommendations       Recommendations for Other Services      Precautions / Restrictions Precautions Precautions: Fall Restrictions Weight Bearing Restrictions: No       Mobility Bed Mobility     Rolling: Min assist   Supine to sit: Max assist Sit to supine: Max assist   General bed mobility comments: PATIENT ABLE TO SIT EOB S TO MIN GUARD ASSIST FOR 10 MIN.   Transfers                      Balance                                           ADL either performed or assessed with clinical judgement   ADL       Grooming: Wash/dry hands;Wash/dry face;Supervision/safety           Upper Body Dressing : Moderate assistance                     General ADL Comments: PATIENT WAS ABLE TO PERFORM ADLS IN SUPINE. PNT DID NOT WANT TO PERFORM ADLS SITTING EOB SECONDAR TO FEAR OF FALLING.      Vision       Perception     Praxis      Cognition Arousal/Alertness: Awake/alert Behavior During Therapy: Anxious Overall Cognitive Status: Impaired/Different from baseline Area of Impairment: Orientation;Awareness;Safety/judgement;Problem solving;Following commands                 Orientation Level: Disoriented to;Time   Memory: Decreased short-term memory Following Commands: Follows one step commands consistently Safety/Judgement: Decreased awareness of deficits;Decreased awareness of safety    Problem Solving: Requires verbal cues;Requires tactile cues;Slow processing General Comments: patient has a fear of falling.         Exercises     Shoulder Instructions       General Comments      Pertinent Vitals/ Pain       Pain Assessment: No/denies pain  Home Living                                          Prior Functioning/Environment              Frequency           Progress Toward Goals  OT Goals(current goals can now be found in the care plan section)  Progress towards OT goals: Progressing toward goals  Acute Rehab OT Goals Patient Stated Goal: get better  Plan Discharge plan remains appropriate    Co-evaluation                 AM-PAC OT "6 Clicks" Daily Activity  Outcome Measure   Help from another person eating meals?: A Lot Help from another person taking care of personal grooming?: A Lot Help from another person toileting, which includes using toliet, bedpan, or urinal?: Total Help from another person bathing (including washing, rinsing, drying)?: Total Help from another person to put on and taking off regular upper body clothing?: A Lot Help from another person to put on and taking off regular lower body clothing?: Total 6 Click Score: 9    End of Session        Activity Tolerance Patient tolerated treatment well   Patient Left in bed;with call bell/phone within reach;with bed alarm set   Nurse Communication (ok therapy)        Time: 7356-7014 OT Time Calculation (min): 32 min  Charges: OT General Charges $OT Visit: 1 Visit OT Treatments $Self Care/Home Management : 23-37 mins     Robert [name] 06/17/2018, 10:37 AM

## 2018-06-18 LAB — GLUCOSE, CAPILLARY
Glucose-Capillary: 100 mg/dL — ABNORMAL HIGH (ref 70–99)
Glucose-Capillary: 122 mg/dL — ABNORMAL HIGH (ref 70–99)
Glucose-Capillary: 138 mg/dL — ABNORMAL HIGH (ref 70–99)
Glucose-Capillary: 161 mg/dL — ABNORMAL HIGH (ref 70–99)
Glucose-Capillary: 145 mg/dL — ABNORMAL HIGH (ref 70–99)

## 2018-06-18 MED ORDER — COLCHICINE 0.6 MG PO TABS
1.2000 mg | ORAL_TABLET | ORAL | Status: AC
  Administered 2018-06-18: 1.2 mg via ORAL
  Filled 2018-06-18: qty 2

## 2018-06-18 MED ORDER — COLCHICINE 0.6 MG PO TABS
0.6000 mg | ORAL_TABLET | Freq: Two times a day (BID) | ORAL | Status: AC
  Administered 2018-06-18 – 2018-06-19 (×3): 0.6 mg via ORAL
  Filled 2018-06-18 (×3): qty 1

## 2018-06-18 MED ORDER — DM-GUAIFENESIN ER 30-600 MG PO TB12
2.0000 | ORAL_TABLET | Freq: Two times a day (BID) | ORAL | Status: DC | PRN
  Administered 2018-06-18 (×2): 2 via ORAL
  Filled 2018-06-18 (×3): qty 2

## 2018-06-18 MED ORDER — PREDNISONE 50 MG PO TABS
50.0000 mg | ORAL_TABLET | Freq: Every day | ORAL | Status: AC
  Administered 2018-06-18 – 2018-06-20 (×3): 50 mg via ORAL
  Filled 2018-06-18 (×3): qty 1

## 2018-06-18 NOTE — Progress Notes (Signed)
PROGRESS NOTE    Robert [name]  XAJ:[id number] DOB: 06-18-1960 DOA: 06/06/2018 PCP: Tammi Sou, MD   Brief Narrative: 58 year old male with history of Afib not on AC 2/2 GIB, DMT2, PUD, HTN, HLD, gout, and ETOH abuse who presented from home on 5/27 with altered mental status and some slurred speech.  Patient lives in different rooms than his wife and last seen in normal 5/26 but she found him confused and laying on the ground. His workup showed a large subacute right MCA stroke.  His initial CXR was negative.  Neurology was consulted and he was admitted to medical floor to hospitalist service.  He remained NPO after failing swallow study. He has had positive troponin and BNP.  EKG with afib/ non-acute.   Follow-up CTH showing evolving subacute R MCA infarction on the morning of 5/28.  Later that morning, he had acute hypoxic decompensation and respiratory distress requiring NRB and transferred to ICU with PCCM consultation.  He required emergent intubation on arrival to ICU.   Assessment & Plan:   Principal Problem:   Stroke [facility]) Active Problems:   Type II diabetes mellitus ([place])   A-fib (HCC)   Hyperlipidemia   Hypertension   Alcohol abuse   Endotracheal tube present   Altered mental status   Encounter for orogastric (OG) tube placement   AKI (acute kidney injury) ([place])   Acute idiopathic gout of right ankle   Leukocytosis   Oropharyngeal dysphagia   Family history of lower GI bleeding  Subacute R MCA stroke, POA MRI unable to be performed due to metal object seen in Left eye on previous CT CT 5/29 large right MCA stable in size mild regional mass-effect with a 2 mm right to left shift, negative for acute large vessel occlusion 70% stenosis at the proximal right ICA.  Left carotid artery patent.  Occlusion of the right vertebral artery. Dominant left vertebral artery patent. Echo/bubble w/o clot Neuro previously following Transition to apixaban; continue statin - DC ASA;  delayed transition per Neuro to avoid conversion of bleed Continue PT/OT - recommending placement - family agreeable; patient continues to be somewhat resistant to SNF placement remains essentially nonambulatory at this point SLP - following -NG removed - tolerating PO quite well over the past 24h.  Aspiration pneumonia vs. Pneumonitis with acute hypoxic respiratory failure, improving Successfully extubated 06/09/18 Augmentin completed 7 day course -last dose 06/17/2018 Speech following - recommending small(teaspoon) sized bites with pured food and honey thick liquids, crushed meds NG tube removed 06/16/18  Obesity Discussed diet/exercise regimen as tolerated as patient hopefully improves strength with PT Likely benefit from sleep study  Afib/RVR, paroxysmal, rate controlled Cardiology following -Dig, ASA, statin -Transition to crushed metoprolol p.o. as above -Transition to apixaban twice daily as above   Encephalopathy/agitation, resolving Hx ETOH use, questionable ICU delirium given sedation and respiratory depression as above Patient appears awake alert oriented today without signs or symptoms of withdrawal Continue  thiamine/ folate/ MVI Continue Seroquel   Hx PUD/ prior GIB Distant past, to monitor for signs or symptoms of bleeding given DVT prophylaxis as above If possible bleeding in the future versus recurrent strokes, patient understands and agrees to the risk of DOAC as above  DMT2, non-insulin-dependent well-controlled a1c 5.5% Holding home medications Continue sliding scale insulin, hypoglycemic protocol  HLD -Continue statin  Gout flare, resolving -Colchicine+prednisone reordered - never received previously due to NPO status previously -Pain markedly improving - hold off on further treatment unless symptomatic  DVT prophylaxis: Heparin  Code Status: Full  Disposition Plan: Patient continues to be high risk for aspiration given large MCA territory stroke  with midline shift as above.  Likely to require SNF placement given PT/OT recs - family aware. Patient awaiting SNF placement - will continue to follow along - will discuss again with the patient the need for PT/OT and rehab given the fact that he cannot walk or perform ADLs at home.  Consultants:   Neurology, cardiology, ICU  Subjective: Patient declines chest pain, shortness of breath, nausea, vomiting, diarrhea, constipation, headache, fevers, chills.  Objective: Vitals:   06/18/18 0544 06/18/18 0700 06/18/18 0900 06/18/18 1300  BP:   (!) 133/99 110/77  Pulse: 92  79 82  Resp: 16  18 18   Temp:   (!) 97.5 F (36.4 C) 97.8 F (36.6 C)  TempSrc:   Axillary Axillary  SpO2: 90%  (!) 89% 100%  Weight:  75.9 kg    Height:        Intake/Output Summary (Last 24 hours) at 06/18/2018 1630 Last data filed at 06/18/2018 1604 Gross per 24 hour  Intake 180 ml  Output 800 ml  Net -620 ml   Filed Weights   06/15/18 0500 06/17/18 0305 06/18/18 0700  Weight: 79 kg 76.8 kg 75.9 kg    Examination:  General:  Pleasantly resting in bed, No acute distress. HEENT:  Normocephalic atraumatic.  Sclerae nonicteric, noninjected.  Extraocular movements intact bilaterally.  NG tube intact in right nostril Neck:  Without mass or deformity.  Trachea is midline. Lungs:  Clear to auscultate bilaterally without rhonchi, wheeze, or rales. Heart:  Regular rate and rhythm.  Without murmurs, rubs, or gallops. Abdomen:  Soft, nontender, nondistended.  Without guarding or rebound. Extremities: Without cyanosis, clubbing, edema, or obvious deformity. Vascular:  Dorsalis pedis and posterior tibial pulses palpable bilaterally. Skin:  Warm and dry, no erythema, no ulcerations.  Data Reviewed: I have personally reviewed following labs and imaging studies  CBC: Recent Labs  Lab 06/12/18 0559 06/14/18 0521 06/16/18 0310  WBC 11.2* 11.8* 10.7*  HGB 11.9* 13.9 13.9  HCT 35.7* 42.5 43.2  MCV 90.8 93.2 93.3   PLT 232 353 397   Basic Metabolic Panel: Recent Labs  Lab 06/11/18 1647  06/12/18 0559 06/12/18 1644 06/13/18 0423 06/13/18 1636 06/14/18 0521 06/15/18 0529 06/16/18 0310  NA  --   --  139  --  143  --  141 142 143  K  --   --  3.1*  --  3.2*  --  3.5 3.3* 3.5  CL  --   --  99  --  100  --  103 103 105  CO2  --   --  29  --  28  --  27 27 27   GLUCOSE  --   --  185*  --  115*  --  158* 115* 118*  BUN  --   --  15  --  16  --  15 17 18   CREATININE  --   --  0.81  --  1.00  --  0.81 0.78 0.79  CALCIUM  --   --  8.5*  --  9.0  --  9.0 9.0 9.0  MG  --    < > 1.8 2.1 1.9 1.9 1.8 1.8 1.8  PHOS 3.4  --  3.0 2.5 2.9  --   --   --   --    < > = values in this  interval not displayed.   GFR: Estimated Creatinine Clearance: 103.9 mL/min (by C-G formula based on SCr of 0.79 mg/dL). Liver Function Tests: No results for input(s): AST, ALT, ALKPHOS, BILITOT, PROT, ALBUMIN in the last 168 hours. No results for input(s): LIPASE, AMYLASE in the last 168 hours. No results for input(s): AMMONIA in the last 168 hours. Coagulation Profile: No results for input(s): INR, PROTIME in the last 168 hours. Cardiac Enzymes: No results for input(s): CKTOTAL, CKMB, CKMBINDEX, TROPONINI in the last 168 hours. BNP (last 3 results) No results for input(s): PROBNP in the last 8760 hours. HbA1C: No results for input(s): HGBA1C in the last 72 hours. CBG: Recent Labs  Lab 06/17/18 1943 06/17/18 2341 06/18/18 0345 06/18/18 0719 06/18/18 1107  GLUCAP 130* 117* 100* 122* 138*   Lipid Profile: No results for input(s): CHOL, HDL, LDLCALC, TRIG, CHOLHDL, LDLDIRECT in the last 72 hours. Thyroid Function Tests: No results for input(s): TSH, T4TOTAL, FREET4, T3FREE, THYROIDAB in the last 72 hours. Anemia Panel: No results for input(s): VITAMINB12, FOLATE, FERRITIN, TIBC, IRON, RETICCTPCT in the last 72 hours. Sepsis Labs: No results for input(s): PROCALCITON, LATICACIDVEN in the last 168 hours.  No results  found for this or any previous visit (from the past 240 hour(s)).   Radiology Studies: No results found.   Scheduled Meds: . apixaban  5 mg Oral BID  . atorvastatin  40 mg Per Tube q1800  . chlorhexidine  15 mL Mouth Rinse BID  . colchicine  0.6 mg Oral BID  . digoxin  0.125 mg Per Tube Daily  . feeding supplement (PRO-STAT SUGAR FREE 64)  30 mL Per Tube BID  . folic acid  1 mg Per Tube Daily  . insulin aspart  0-20 Units Subcutaneous TID AC & HS  . mouth rinse  15 mL Mouth Rinse q12n4p  . metoprolol tartrate  25 mg Oral BID  . multivitamin with minerals  1 tablet Per Tube Daily  . pantoprazole sodium  40 mg Per Tube Daily  . predniSONE  50 mg Oral Q breakfast  . QUEtiapine  25 mg Oral BID  . thiamine  100 mg Per Tube Daily   Continuous Infusions: . sodium chloride Stopped (06/10/18 1526)  . sodium chloride 10 mL/hr at 06/12/18 1600  . feeding supplement (JEVITY 1.5 CAL/FIBER) 1,000 mL (06/15/18 2241)  . lactated ringers Stopped (06/11/18 1222)     LOS: 12 days   Time spent: 1min   C , DO [facility]  If 7PM-7AM, please contact night-coverage [email or web address] Password [email or web address] 06/18/2018, 4:30 PM

## 2018-06-18 NOTE — Progress Notes (Signed)
CSW received word from Tammy at [place] that the patient will need new PT/OT evaluations. CSW informed the MD that new evaluations were needed.   CSW will continue to assist with discharge.   Domenic Schwab, MSW, [place]

## 2018-06-18 NOTE — TOC Progression Note (Signed)
Transition of Care [facility]) - Progression Note    Patient Details  Name: Robert [name] MRN: [id number] Date of Birth: 01/24/1960  Transition of Care [facility]) CM/SW [place], [place] Phone Number: 06/18/2018, 4:03 PM  Clinical Narrative:     CSW received a phone call from the patient's wife. The patient's wife has decided to send her husband to [place]. The patient's wife asked to be contacted once his insurance authorization was received so that she can get together what he needs for his SNF.   CSW called and spoke with Tammy at [place]. Tammy stated that she would start the patient's insurance authorization.   CSW will continue to follow and assist with disposition.    Expected Discharge Plan: [place] Barriers to Discharge: Continued Medical Work up  Expected Discharge Plan and Services Expected Discharge Plan: [place] arrangements for the past 2 months: Single Family Home                                       Social Determinants of Health (SDOH) Interventions    Readmission Risk Interventions No flowsheet data found.

## 2018-06-18 NOTE — Progress Notes (Signed)
Pt refusing to wear cpap for the night. RT will continue to monitor as needed. 

## 2018-06-18 NOTE — Progress Notes (Signed)
  Speech Language Pathology Treatment: Dysphagia  Patient Details Name: Robert [name] MRN: [id number] DOB: September 12, 1960 Today's Date: 06/18/2018 Time: 0710-0723 SLP Time Calculation (min) (ACUTE ONLY): 13 min  Assessment / Plan / Recommendation Clinical Impression  Skilled treatment session focused on dysphagia and continue ongoing assessment of safety with current diet. SLP facilitated session by providing skilled observation of pt consuming puree and honey thick liquids via tsp. Of note, pt had been suctioned by RT ~ 1 hour prior to session. As such, pt didn't appear to be struggling with secretions in this session. Pt required Min A verbal cues for use of compensatory swallow strategies. Pt with intermittent spontaneous cough and benefited from cue to perform "hard cough." Of note, pt's Cortrac has been removed. After session, this writer spoke with pt's nurse and she didn't report any difficulty consuming medicine crushed in puree. Current diet appears appropriate. ST to follow.    HPI HPI: pt is a 58 yo male adm to [facility] with large right MCA territory CVA, PMH + for ETOH use, HTN, DM2, Gi bleed, and pt developed aspiration pna and was intubated on 5/25-5/30/2020.  Swallow eval ordered after pt extubated 5/30 at 0930.  CXR showed right basilar ATX, improving edema.        SLP Plan  Continue with current plan of care;MBS       Recommendations  Diet recommendations: Dysphagia 1 (puree);Honey-thick liquid Liquids provided via: Teaspoon Medication Administration: Crushed with puree Supervision: Staff to assist with self feeding;Full supervision/cueing for compensatory strategies Compensations: Slow rate;Small sips/bites;Minimize environmental distractions;Multiple dry swallows after each bite/sip;Hard cough after swallow Postural Changes and/or Swallow Maneuvers: Seated upright 90 degrees                Oral Care Recommendations: Oral care QID Follow up Recommendations: Skilled Nursing  facility SLP Visit Diagnosis: Dysphagia, pharyngeal phase (R13.13) Plan: Continue with current plan of care;MBS       GO                Robert [name] 06/18/2018, 9:10 AM

## 2018-06-18 NOTE — Progress Notes (Signed)
Called to patient's room by RN, patient states that its difficult to breathe. Patient given PRN treatment and NT suctioned with moderate secretions removed from airway. Patient states that he feels better and its easier to breathe. Will continue to monitor patient.

## 2018-06-19 DIAGNOSIS — M10071 Idiopathic gout, right ankle and foot: Secondary | ICD-10-CM

## 2018-06-19 LAB — GLUCOSE, CAPILLARY
Glucose-Capillary: 108 mg/dL — ABNORMAL HIGH (ref 70–99)
Glucose-Capillary: 152 mg/dL — ABNORMAL HIGH (ref 70–99)
Glucose-Capillary: 190 mg/dL — ABNORMAL HIGH (ref 70–99)
Glucose-Capillary: 160 mg/dL — ABNORMAL HIGH (ref 70–99)

## 2018-06-19 LAB — SARS CORONAVIRUS 2: SARS Coronavirus 2: NOT DETECTED

## 2018-06-19 MED ORDER — ONDANSETRON HCL 4 MG PO TABS: 4.0000 mg | ORAL_TABLET | Freq: Three times a day (TID) | ORAL | Status: DC | PRN

## 2018-06-19 MED ORDER — THIAMINE HCL 100 MG PO TABS: 100.0000 mg | ORAL_TABLET | Freq: Every day | ORAL | 0 refills | Status: AC

## 2018-06-19 MED ORDER — APIXABAN 5 MG PO TABS: 5.0000 mg | ORAL_TABLET | Freq: Two times a day (BID) | ORAL | 0 refills | Status: DC

## 2018-06-19 MED ORDER — ONDANSETRON HCL 4 MG PO TABS: 4.0000 mg | ORAL_TABLET | Freq: Three times a day (TID) | ORAL | 0 refills | Status: DC | PRN

## 2018-06-19 MED ORDER — ADULT MULTIVITAMIN W/MINERALS CH: 1.0000 | ORAL_TABLET | Freq: Every day | ORAL | 0 refills | Status: AC

## 2018-06-19 MED ORDER — METOPROLOL TARTRATE 25 MG PO TABS: 25.0000 mg | ORAL_TABLET | Freq: Two times a day (BID) | ORAL | 0 refills | Status: DC

## 2018-06-19 MED ORDER — FOLIC ACID 1 MG PO TABS: 1.0000 mg | ORAL_TABLET | Freq: Every day | ORAL | 0 refills | Status: AC

## 2018-06-19 MED ORDER — QUETIAPINE FUMARATE 25 MG PO TABS: 25.0000 mg | ORAL_TABLET | Freq: Two times a day (BID) | ORAL | 0 refills | Status: DC

## 2018-06-19 MED ORDER — ATORVASTATIN CALCIUM 40 MG PO TABS: 40.0000 mg | ORAL_TABLET | Freq: Every day | ORAL | 0 refills | Status: DC

## 2018-06-19 NOTE — Telephone Encounter (Signed)
Patient's wife dropped off FMLA paperwork for PTI

## 2018-06-19 NOTE — Progress Notes (Addendum)
Occupational Therapy Treatment Patient Details Name: Berdell Hostetler MRN: [id number] DOB: Sep 21, 1960 Today's Date: 06/19/2018    History of present illness pt is a 58 yo male adm to [facility] with large right MCA territory CVA, PMH + for ETOH use, HTN, DM2, Gi bleed, and pt developed aspiration pna and was intubated on 5/25-5/30/2020.   OT comments  Pt with good progress towards therapy goals and able to progress OOB to recliner during this session; pt motivated to work with therapy for OOB. Use of Stedy today for functional transfers, pt initially requiring modA+2 for sit<>stand, progressed to minA +2 when standing from higher surface of Stedy seat. Pt partly limited in mobility due to chronic R hip pain. Pt completed grooming ADL while standing at [facility], able to release RUE to wash face with minA to maintain static balance. Once in standing pt is able to maintain static balance with at least minA for >107min using UE support. Feel SNF recommendation remains appropriate at this time. Will continue to follow acutely.   Follow Up Recommendations  SNF;Supervision/Assistance - 24 hour    Equipment Recommendations  None recommended by OT          Precautions / Restrictions Precautions Precautions: Fall Restrictions Weight Bearing Restrictions: No       Mobility Bed Mobility Overal bed mobility: Needs Assistance Bed Mobility: Supine to Sit     Supine to sit: Min assist     General bed mobility comments: increased time and effort; use of rail; cues for sequencing and assist to bring hips to EOB with bed pad  Transfers Overall transfer level: Needs assistance   Transfers: Sit to/from Stand Sit to Stand: Mod assist;Min assist;+2 physical assistance;+2 safety/equipment;From elevated surface         General transfer comment: mod A +2 to stand from elevated bed height utilizing Stedy standing frame and min A +2 from [place] seat (higher surface); cues and assist for R LE positioning, hand  placement, and anterior translation of trunk; pt with difficulty positioning himself due to R hip pain    Balance Overall balance assessment: Needs assistance Sitting-balance support: Feet unsupported;Bilateral upper extremity supported Sitting balance-Leahy Scale: Poor   Postural control: Posterior lean;Right lateral lean Standing balance support: Bilateral upper extremity supported;During functional activity Standing balance-Leahy Scale: Poor                             ADL either performed or assessed with clinical judgement   ADL Overall ADL's : Needs assistance/impaired     Grooming: Set up;Minimal assistance;Sitting;Standing Grooming Details (indicate cue type and reason): pt standing in Stedy frame; able to release RUE from Stedy to wash face, minA for standing balance             Lower Body Dressing: Maximal assistance;+2 for safety/equipment;+2 for physical assistance;Sit to/from stand Lower Body Dressing Details (indicate cue type and reason): assist to readjust socks today in sitting; min-modA+2 for sit<>stand             Functional mobility during ADLs: Minimal assistance;Moderate assistance;+2 for physical assistance;+2 for safety/equipment(sit<>stand with Stedy) General ADL Comments: pt progressing towards goals and able to progress OOB today during session     Vision       Perception     Praxis      Cognition Arousal/Alertness: Awake/alert Behavior During Therapy: Anxious;WFL for tasks assessed/performed(some anxiety still with mobility but eager to mobilize) Overall Cognitive Status: No family/caregiver  present to determine baseline cognitive functioning Area of Impairment: Safety/judgement;Problem solving                         Safety/Judgement: Decreased awareness of deficits;Decreased awareness of safety   Problem Solving: Requires verbal cues;Slow processing;Difficulty sequencing;Requires tactile cues           Exercises     Shoulder Instructions       General Comments      Pertinent Vitals/ Pain       Pain Assessment: Faces Faces Pain Scale: Hurts even more Pain Location: R hip with movement Pain Descriptors / Indicators: Sore;Grimacing;Guarding Pain Intervention(s): Monitored during session;Limited activity within patient's tolerance;Repositioned  Home Living                                          Prior Functioning/Environment              Frequency  Min 2X/week        Progress Toward Goals  OT Goals(current goals can now be found in the care plan section)  Progress towards OT goals: Progressing toward goals  Acute Rehab OT Goals Patient Stated Goal: get better OT Goal Formulation: With patient Time For Goal Achievement: 07/22/18 Potential to Achieve Goals: Good  Plan Discharge plan remains appropriate    Co-evaluation    PT/OT/SLP Co-Evaluation/Treatment: Yes Reason for Co-Treatment: Complexity of the patient's impairments (multi-system involvement);For patient/therapist safety;To address functional/ADL transfers;Necessary to address cognition/behavior during functional activity PT goals addressed during session: Mobility/safety with mobility;Balance OT goals addressed during session: ADL's and self-care;Strengthening/ROM      AM-PAC OT "6 Clicks" Daily Activity     Outcome Measure   Help from another person eating meals?: A Lot Help from another person taking care of personal grooming?: A Little Help from another person toileting, which includes using toliet, bedpan, or urinal?: A Lot Help from another person bathing (including washing, rinsing, drying)?: A Lot Help from another person to put on and taking off regular upper body clothing?: A Lot Help from another person to put on and taking off regular lower body clothing?: A Lot 6 Click Score: 13    End of Session Equipment Utilized During Treatment: Gait belt  OT Visit Diagnosis:  Unsteadiness on feet (R26.81);Cognitive communication deficit (R41.841) Symptoms and signs involving cognitive functions: Cerebral infarction   Activity Tolerance Patient tolerated treatment well   Patient Left in chair;with call bell/phone within reach;with chair alarm set   Nurse Communication Mobility status;Need for lift equipment        Time: 1030-1108 OT Time Calculation (min): 38 min  Charges: OT General Charges $OT Visit: 1 Visit OT Treatments $Self Care/Home Management : 8-22 mins  Lou Cal, OT [facility] Pager [phone] Office [phone]   Raymondo Band 06/19/2018, 11:49 AM

## 2018-06-19 NOTE — Progress Notes (Addendum)
CSW spoke with Tammy from [place] (SNF).  [facility] will need a PT and OT note within the last 24 hour and also a Covid 19 test before pt is accepted to facility.  CSW also updated RN Anderson Malta with requested information.  Reed Breech LCSWA [phone]

## 2018-06-19 NOTE — Plan of Care (Signed)
Progressing towards goals

## 2018-06-19 NOTE — Progress Notes (Signed)
Physical Therapy Treatment Patient Details Name: Robert [name] MRN: [id number] DOB: Sep 26, 1960 Today's Date: 06/19/2018    History of Present Illness pt is a 58 yo male adm to [facility] with large right MCA territory CVA, PMH + for ETOH use, HTN, DM2, Gi bleed, and pt developed aspiration pna and was intubated on 5/25-5/30/2020.    PT Comments    Patient seen for mobility progression.  Pt is making progress toward PT goals and eager to participate in therapy. Pt requires min/mod A +2 for sit to stand transfers and min guard/min A for pre gait activities utilizing Stedy standing frame. Continue to progress as tolerated with anticipated d/c to SNF for further skilled PT services.     Follow Up Recommendations  SNF     Equipment Recommendations  Other (comment)(TBD )    Recommendations for Other Services       Precautions / Restrictions Precautions Precautions: Fall Restrictions Weight Bearing Restrictions: No    Mobility  Bed Mobility Overal bed mobility: Needs Assistance Bed Mobility: Supine to Sit     Supine to sit: Min assist     General bed mobility comments: increased time and effort; use of rail; cues for sequencing and assist to bring hips to EOB with bed pad  Transfers Overall transfer level: Needs assistance   Transfers: Sit to/from Stand Sit to Stand: Mod assist;Min assist;+2 physical assistance;+2 safety/equipment;From elevated surface         General transfer comment: mod A +2 to stand from elevated bed height utilizing Stedy standing frame and min A +2 from Spurgeon seat (higher surface); cues and assist for R LE positioning, hand placement, and anterior translation of trunk; pt with difficulty positioning himself due to R hip pain  Ambulation/Gait             General Gait Details: worked on pre gait activities utilizing Stedy standing frame with min guard/min A for balance   Stairs             Wheelchair Mobility    Modified Rankin (Stroke  Patients Only) Modified Rankin (Stroke Patients Only) Pre-Morbid Rankin Score: Slight disability Modified Rankin: Moderately severe disability     Balance Overall balance assessment: Needs assistance Sitting-balance support: Feet unsupported;Bilateral upper extremity supported Sitting balance-Leahy Scale: Poor   Postural control: Posterior lean;Right lateral lean Standing balance support: Bilateral upper extremity supported;During functional activity Standing balance-Leahy Scale: Poor                              Cognition Arousal/Alertness: Awake/alert Behavior During Therapy: Anxious;WFL for tasks assessed/performed(some anxiety still with mobility but eager to mobilize) Overall Cognitive Status: No family/caregiver present to determine baseline cognitive functioning Area of Impairment: Safety/judgement;Problem solving                         Safety/Judgement: Decreased awareness of deficits;Decreased awareness of safety   Problem Solving: Requires verbal cues;Slow processing;Difficulty sequencing;Requires tactile cues        Exercises      General Comments        Pertinent Vitals/Pain Pain Assessment: Faces Faces Pain Scale: Hurts even more Pain Location: R hip with movement Pain Descriptors / Indicators: Sore;Grimacing;Guarding Pain Intervention(s): Monitored during session;Limited activity within patient's tolerance;Repositioned    Home Living                      Prior Function  PT Goals (current goals can now be found in the care plan section) Progress towards PT goals: Progressing toward goals    Frequency    Min 2X/week      PT Plan Current plan remains appropriate    Co-evaluation PT/OT/SLP Co-Evaluation/Treatment: Yes Reason for Co-Treatment: Complexity of the patient's impairments (multi-system involvement);For patient/therapist safety;To address functional/ADL transfers PT goals addressed during  session: Mobility/safety with mobility;Balance        AM-PAC PT "6 Clicks" Mobility   Outcome Measure  Help needed turning from your back to your side while in a flat bed without using bedrails?: A Lot Help needed moving from lying on your back to sitting on the side of a flat bed without using bedrails?: A Lot Help needed moving to and from a bed to a chair (including a wheelchair)?: A Lot Help needed standing up from a chair using your arms (e.g., wheelchair or bedside chair)?: A Lot Help needed to walk in hospital room?: A Lot Help needed climbing 3-5 steps with a railing? : Total 6 Click Score: 11    End of Session Equipment Utilized During Treatment: Gait belt;Oxygen Activity Tolerance: Patient tolerated treatment well Patient left: with call bell/phone within reach;in chair;with chair alarm set;with nursing/sitter in room Nurse Communication: Mobility status PT Visit Diagnosis: Unsteadiness on feet (R26.81);Other abnormalities of gait and mobility (R26.89);Other symptoms and signs involving the nervous system (R29.898)     Time: 1030-1107 PT Time Calculation (min) (ACUTE ONLY): 37 min  Charges:  $Gait Training: 8-22 mins                     Earney Navy, PTA Acute Rehabilitation Services Pager: [phone] Office: [phone]     Darliss Cheney 06/19/2018, 11:26 AM
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
## 2018-06-19 NOTE — Plan of Care (Signed)
  Problem: Education: Goal: Knowledge of General Education information will improve Description Including pain rating scale, medication(s)/side effects and non-pharmacologic comfort measures Outcome: Progressing   Problem: Health Behavior/Discharge Planning: Goal: Ability to manage health-related needs will improve Outcome: Progressing   Problem: Clinical Measurements: Goal: Ability to maintain clinical measurements within normal limits will improve Outcome: Progressing Goal: Will remain free from infection Outcome: Progressing Goal: Diagnostic test results will improve Outcome: Progressing Goal: Respiratory complications will improve Outcome: Progressing Goal: Cardiovascular complication will be avoided Outcome: Progressing   Problem: Activity: Goal: Risk for activity intolerance will decrease Outcome: Progressing   Problem: Nutrition: Goal: Adequate nutrition will be maintained Outcome: Progressing   Problem: Coping: Goal: Level of anxiety will decrease Outcome: Progressing   Problem: Elimination: Goal: Will not experience complications related to bowel motility Outcome: Progressing Goal: Will not experience complications related to urinary retention Outcome: Progressing   Problem: Pain Managment: Goal: General experience of comfort will improve Outcome: Progressing   Problem: Safety: Goal: Ability to remain free from injury will improve Outcome: Progressing   Problem: Skin Integrity: Goal: Risk for impaired skin integrity will decrease Outcome: Progressing   Problem: Education: Goal: Knowledge of disease or condition will improve Outcome: Progressing Goal: Knowledge of secondary prevention will improve Outcome: Progressing Goal: Knowledge of patient specific risk factors addressed and post discharge goals established will improve Outcome: Progressing Goal: Individualized Educational Video(s) Outcome: Progressing   Problem: Coping: Goal: Will verbalize  positive feelings about self Outcome: Progressing Goal: Will identify appropriate support needs Outcome: Progressing   Problem: Health Behavior/Discharge Planning: Goal: Ability to manage health-related needs will improve Outcome: Progressing   Problem: Self-Care: Goal: Ability to participate in self-care as condition permits will improve Outcome: Progressing Goal: Verbalization of feelings and concerns over difficulty with self-care will improve Outcome: Progressing Goal: Ability to communicate needs accurately will improve Outcome: Progressing   Problem: Nutrition: Goal: Risk of aspiration will decrease Outcome: Progressing Goal: Dietary intake will improve Outcome: Progressing   Problem: Ischemic Stroke/TIA Tissue Perfusion: Goal: Complications of ischemic stroke/TIA will be minimized Outcome: Progressing  Emmani Lesueur, BSN, RN 

## 2018-06-20 ENCOUNTER — Other Ambulatory Visit: Payer: Self-pay

## 2018-06-20 LAB — GLUCOSE, CAPILLARY: Glucose-Capillary: 99 mg/dL (ref 70–99)

## 2018-06-20 NOTE — Plan of Care (Signed)
  Problem: Pain Managment: Goal: General experience of comfort will improve 06/20/2018 0327 by Lennox Grumbles, RN Outcome: Progressing 06/20/2018 0326 by Lennox Grumbles, RN Outcome: Progressing

## 2018-06-20 NOTE — Progress Notes (Signed)
Patient being discharged to [facility]. Report given to [place].  No IV to remove. Discharge instructions and prescription information given.

## 2018-06-20 NOTE — Progress Notes (Addendum)
[facility]  PROGRESS NOTE    Robert [name]  EZM:[id number] DOB: [birth year] DOA: 06/06/2018 PCP: Tammi Sou, MD   Brief Narrative:   58 year old male with history of Afib not on AC 2/2 GIB, DMT2, PUD, HTN, HLD, gout, and ETOH abuse who presented from home on 5/27 with altered mental status and some slurred speech. Patient lives in different rooms than his wife and last seen in normal 5/26 but she found him confused and laying on the ground. His workup showed a large subacute right MCA stroke. His initial CXR was negative. Neurology was consulted and he was admitted to medical floor to hospitalist service. He remained NPO after failing swallow study. He has had positive troponin and BNP. EKG with afib/ non-acute. Follow-up CTH showing evolving subacute R MCA infarction on the morning of 5/28. Later that morning, he had acute hypoxic decompensation and respiratory distress requiring NRB and transferred to ICU with PCCM consultation. He required emergent intubation on arrival to ICU.    Assessment & Plan:   Principal Problem:   Stroke [facility]) Active Problems:   Type II diabetes mellitus ([place])   A-fib ([facility])   Hyperlipidemia   Hypertension   Alcohol abuse   Endotracheal tube present   Altered mental status   Encounter for orogastric (OG) tube placement   AKI (acute kidney injury) ([place])   Acute idiopathic gout of right ankle   Leukocytosis   Oropharyngeal dysphagia   Family history of lower GI bleeding   Subacute R MCA stroke, POA     - MRI unable to be performed due to metal object seen in Left eye on previous CT     - CT 5/29 large right MCA stable in size mild regional mass-effect with a 2 mm right to left shift, negative for acute large vessel occlusion     - 70% stenosis at the proximal right ICA. Left carotid artery patent. Occlusion of the right vertebral artery.     - Dominant left vertebral artery patent.     - Echo/bubble w/o clot     - Neuro previously following     -  Transition to apixaban; continue statin - DC ASA; delayed transition per Neuro to avoid conversion of bleed     - Continue PT/OT - recommending placement - family agreeable; patient continues to be somewhatresistantto SNF placement remains essentially nonambulatory at this point     - SLP - following -NG removed - tolerating PO quite well over the past 24h.  Aspiration pneumonia vs. Pneumonitis with acute hypoxic respiratory failure, improving     - Successfully extubated 06/09/18     - Augmentin completed 7 day course -last dose 06/17/2018     - Speech following - recommending small(teaspoon) sized bites with pured food and honey thick liquids, crushed meds     - NG tube removed 06/16/18  Obesity     - Discussed diet/exercise regimen as tolerated as patient hopefully improves strength with PT     - Likely benefit from sleep study in the near future  Afib/RVR, paroxysmal, rate controlled     - Cardiology eval'd and s/o'd     - Dig, ASA, statin     - Transition to crushed metoprolol p.o. as above; increase dosing as BP permit, can be titrated outpt     - Transition to apixaban twice daily as above  Encephalopathy/agitation, resolving     - Hx ETOH use vs questionable ICU delirium given sedation and respiratory depression  as above     - Patient remains awake alert oriented today without signs or symptoms of withdrawal     - Continue thiamine/ folate/ MVI     - Continue Seroquel   Hx PUD/ prior GIB     - Distant past, to monitor for signs or symptoms of bleeding given DVT prophylaxis as above     - If possible bleeding in the future versus recurrent strokes, patient understands and agrees to the risk of DOAC as above  DMT2, non-insulin-dependent well-controlled     - a1c 5.5%     - Holding home medications     - Continue sliding scale insulin, hypoglycemic protocol  HLD     - Continue statin  Gout flare, resolving     - Colchicine+prednisonereordered - never  receivedpreviouslydue to NPO status     - Pain markedly improving - hold off on further treatment unless symptomatic  He is irritable this AM, but apparently this is his baseline. HR up this AM. Give his metoprolol early. Can increase dosing as long a BP tolerates. Working on SNF placement. Needs insurance approval. Continue as above otherwise.   ADDENDUM: Notified by SWer that patient has Biochemist, clinical for SNF. Will discharge to SNF. Ok to discharge as per D/C Summary from 06/19/18.   DVT prophylaxis: Eliquis Code Status: FULL   Disposition Plan: To SNF   Consultants:   Cardiology  Neurology    Subjective: "I was told that I am leaving at [place]!"  Objective: Vitals:   06/19/18 2214 06/19/18 2343 06/20/18 0343 06/20/18 1138  BP: (!) 138/99 (!) 142/101 (!) 149/87 (!) 135/93  Pulse: 97 88 81 99  Resp:  18 20 20   Temp:  98.5 F (36.9 C) 98.2 F (36.8 C) 98 F (36.7 C)  TempSrc:  Oral Oral Axillary  SpO2:  100% 100% 99%  Weight:   77.2 kg   Height:        Intake/Output Summary (Last 24 hours) at 06/20/2018 1217 Last data filed at 06/19/2018 2343 Gross per 24 hour  Intake 240 ml  Output 540 ml  Net -300 ml   Filed Weights   06/18/18 0700 06/19/18 0345 06/20/18 0343  Weight: 75.9 kg 74.7 kg 77.2 kg    Examination:  General: 58 y.o. male resting in bed in NAD Cardiovascular: tachy, +S1, S2, no m/g/r, equal pulses throughout Respiratory: CTABL, no w/r/r, normal WOB GI: BS+, NDNT, no masses noted, no organomegaly noted MSK: No e/c/c Skin: No rashes, bruises, ulcerations noted Psyc: irritable, but cooperative     Data Reviewed: I have personally reviewed following labs and imaging studies.  CBC: Recent Labs  Lab 06/14/18 0521 06/16/18 0310  WBC 11.8* 10.7*  HGB 13.9 13.9  HCT 42.5 43.2  MCV 93.2 93.3  PLT 353 884   Basic Metabolic Panel: Recent Labs  Lab 06/13/18 1636 06/14/18 0521 06/15/18 0529 06/16/18 0310  NA  --  141 142 143  K  --  3.5  3.3* 3.5  CL  --  103 103 105  CO2  --  27 27 27   GLUCOSE  --  158* 115* 118*  BUN  --  15 17 18   CREATININE  --  0.81 0.78 0.79  CALCIUM  --  9.0 9.0 9.0  MG 1.9 1.8 1.8 1.8   GFR: Estimated Creatinine Clearance: 103.9 mL/min (by C-G formula based on SCr of 0.79 mg/dL). Liver Function Tests: No results for input(s): AST, ALT, ALKPHOS, BILITOT, PROT, ALBUMIN  in the last 168 hours. No results for input(s): LIPASE, AMYLASE in the last 168 hours. No results for input(s): AMMONIA in the last 168 hours. Coagulation Profile: No results for input(s): INR, PROTIME in the last 168 hours. Cardiac Enzymes: No results for input(s): CKTOTAL, CKMB, CKMBINDEX, TROPONINI in the last 168 hours. BNP (last 3 results) No results for input(s): PROBNP in the last 8760 hours. HbA1C: No results for input(s): HGBA1C in the last 72 hours. CBG: Recent Labs  Lab 06/19/18 0603 06/19/18 1132 06/19/18 1638 06/19/18 2107 06/20/18 0604  GLUCAP 108* 152* 190* 160* 99   Lipid Profile: No results for input(s): CHOL, HDL, LDLCALC, TRIG, CHOLHDL, LDLDIRECT in the last 72 hours. Thyroid Function Tests: No results for input(s): TSH, T4TOTAL, FREET4, T3FREE, THYROIDAB in the last 72 hours. Anemia Panel: No results for input(s): VITAMINB12, FOLATE, FERRITIN, TIBC, IRON, RETICCTPCT in the last 72 hours. Sepsis Labs: No results for input(s): PROCALCITON, LATICACIDVEN in the last 168 hours.  Recent Results (from the past 240 hour(s))  SARS Coronavirus 2     Status: None   Collection Time: 06/19/18  9:50 AM  Result Value Ref Range Status   SARS Coronavirus 2 NOT DETECTED NOT DETECTED Final    Comment: (NOTE) SARS-CoV-2 target nucleic acids are NOT DETECTED. The SARS-CoV-2 RNA is generally detectable in upper and lower respiratory specimens during the acute phase of infection.  Negative  results do not preclude SARS-CoV-2 infection, do not rule out co-infections with other pathogens, and should not be used as  the sole basis for treatment or other patient management decisions.  Negative results must be combined with clinical observations, patient history, and epidemiological information. The expected result is Not Detected. Fact Sheet for Patients: http://www.biofiredefense.com/wp-content/uploads/2020/03/BIOFIRE-COVID -19-patients.pdf Fact Sheet for Healthcare Providers: http://www.biofiredefense.com/wp-content/uploads/2020/03/BIOFIRE-COVID -19-hcp.pdf This test is not yet approved or cleared by the [place] and  has been authorized for detection and/or diagnosis of SARS-CoV-2 by FDA under an Emergency Use Authorization (EUA).  This EUA will remain in effec t (meaning this test can be used) for the duration of  the COVID-19 declaration under Section 564(b)(1) of the Act, 21 U.S.C. section 360bbb-3(b)(1), unless the authorization is terminated or revoked sooner. Performed at [facility], [place] [address], [place], [place] [address]      Radiology Studies: No results found.   Scheduled Meds: . apixaban  5 mg Oral BID  . atorvastatin  40 mg Per Tube q1800  . chlorhexidine  15 mL Mouth Rinse BID  . digoxin  0.125 mg Per Tube Daily  . feeding supplement (PRO-STAT SUGAR FREE 64)  30 mL Per Tube BID  . folic acid  1 mg Per Tube Daily  . insulin aspart  0-20 Units Subcutaneous TID AC & HS  . mouth rinse  15 mL Mouth Rinse q12n4p  . metoprolol tartrate  25 mg Oral BID  . multivitamin with minerals  1 tablet Per Tube Daily  . pantoprazole sodium  40 mg Per Tube Daily  . QUEtiapine  25 mg Oral BID  . thiamine  100 mg Per Tube Daily   Continuous Infusions: . sodium chloride Stopped (06/10/18 1526)  . sodium chloride 10 mL/hr at 06/12/18 1600  . feeding supplement (JEVITY 1.5 CAL/FIBER) 1,000 mL (06/15/18 2241)  . lactated ringers Stopped (06/11/18 1222)     LOS: 14 days    Time spent: 25 minutes spent in the coordination of care today.     Jonnie Finner, DO [facility] Pager [phone]  If 7PM-7AM, please contact night-coverage [email or web address] Password [facility] 06/20/2018, 12:17 PM

## 2018-06-20 NOTE — Progress Notes (Signed)
Pt refused cpap for the night.  

## 2018-06-20 NOTE — Progress Notes (Signed)
Pt extremely anxious about being discharged and repeatedly asking "what time will I be leaving"? After being educated that his doctor will inform the nursing staff and we will inform him of day and time of discharge.

## 2018-06-20 NOTE — TOC Transition Note (Signed)
Transition of Care [facility]) - CM/SW Discharge Note   Patient Details  Name: Robert [name] MRN: [id number] Date of Birth: [birth year]  [facility] CM/SW Contact:  Geralynn Ochs, LCSW Phone Number: 06/20/2018, 3:34 PM   Clinical Narrative:   Nurse to call report to [phone], Room 164      Barriers to Discharge: Barriers Resolved   Patient Goals and CMS Choice Patient states their goals for this hospitalization and ongoing recovery are:: ready to go anywhere as long as I can get out of the hospital. I want to get in my own bed. I can not get any sleep and rest here. CMS Medicare.gov Compare Post Acute Care list provided to:: Patient(patient and spouse) Choice offered to / list presented to : Patient, Spouse  Discharge Placement              Patient chooses bed at: ([facility]) Patient to be transferred to facility by: Ethel Name of family member notified: Freda Munro Patient and family notified of of transfer: 06/20/18  Discharge Plan and Services                                     Social Determinants of Health (SDOH) Interventions     Readmission Risk Interventions No flowsheet data found.

## 2018-06-20 NOTE — Progress Notes (Signed)
SLP printed copy of MBS study and placed in pt's folder for SNF SLP.  Thanks.   Luanna Salk, [facility] SLP Acute Rehab Services Pager [phone] Office [phone]

## 2018-06-26 ENCOUNTER — Telehealth: Payer: Self-pay

## 2018-06-26 NOTE — Telephone Encounter (Signed)
I won't fill this FMLA paperwork out until I see him for follow up, preferably IN OFFICE-thx

## 2018-06-26 NOTE — Telephone Encounter (Signed)
Received FMLA paperwork from wife, Freda Munro regarding pt. She was advised PCP would be returning to office today. Needs to be faxed to [phone].  Given to PCP for completion.

## 2018-06-27 NOTE — Telephone Encounter (Signed)
Lmom to call back to set up in office appt with Dr.McGowen - complete FMLA paperwork

## 2018-06-29 NOTE — Telephone Encounter (Signed)
Patient is still in quarantine at the rehab facility. Patient's wife will be available by phone this afternoon to go over questions on the [facility] paperwork. She will be on her lunch hour from 1-2.

## 2018-07-02 ENCOUNTER — Ambulatory Visit: Payer: BLUE CROSS/BLUE SHIELD | Admitting: Family Medicine

## 2018-07-02 DIAGNOSIS — Z0279 Encounter for issue of other medical certificate: Secondary | ICD-10-CM

## 2018-07-02 NOTE — Telephone Encounter (Signed)
FMLA paperwork completed. Given to patients wife, Freda Munro.

## 2018-07-05 NOTE — Telephone Encounter (Signed)
Paperwork is complete and has been given to patient.

## 2018-07-24 ENCOUNTER — Ambulatory Visit (INDEPENDENT_AMBULATORY_CARE_PROVIDER_SITE_OTHER): Payer: BLUE CROSS/BLUE SHIELD | Admitting: Family Medicine

## 2018-07-24 ENCOUNTER — Other Ambulatory Visit: Payer: Self-pay

## 2018-07-24 ENCOUNTER — Encounter: Payer: Self-pay | Admitting: Family Medicine

## 2018-07-24 VITALS — BP 101/66 | HR 79 | Temp 97.5°F | Resp 16 | Ht 68.0 in | Wt 155.8 lb

## 2018-07-24 DIAGNOSIS — I482 Chronic atrial fibrillation, unspecified: Secondary | ICD-10-CM

## 2018-07-24 DIAGNOSIS — I428 Other cardiomyopathies: Secondary | ICD-10-CM | POA: Diagnosis not present

## 2018-07-24 DIAGNOSIS — Z7901 Long term (current) use of anticoagulants: Secondary | ICD-10-CM

## 2018-07-24 DIAGNOSIS — Z8673 Personal history of transient ischemic attack (TIA), and cerebral infarction without residual deficits: Secondary | ICD-10-CM

## 2018-07-24 DIAGNOSIS — I951 Orthostatic hypotension: Secondary | ICD-10-CM

## 2018-07-24 DIAGNOSIS — Z79899 Other long term (current) drug therapy: Secondary | ICD-10-CM

## 2018-07-24 DIAGNOSIS — F1011 Alcohol abuse, in remission: Secondary | ICD-10-CM

## 2018-07-24 DIAGNOSIS — R42 Dizziness and giddiness: Secondary | ICD-10-CM

## 2018-07-24 LAB — CBC WITH DIFFERENTIAL/PLATELET
Basophils Absolute: 0 10*3/uL (ref 0.0–0.1)
Basophils Relative: 0.6 % (ref 0.0–3.0)
Eosinophils Absolute: 0 10*3/uL (ref 0.0–0.7)
Eosinophils Relative: 0.6 % (ref 0.0–5.0)
HCT: 39.1 % (ref 39.0–52.0)
Hemoglobin: 12.9 g/dL — ABNORMAL LOW (ref 13.0–17.0)
Lymphocytes Relative: 26.9 % (ref 12.0–46.0)
Lymphs Abs: 1.9 10*3/uL (ref 0.7–4.0)
MCHC: 32.9 g/dL (ref 30.0–36.0)
MCV: 91.4 fl (ref 78.0–100.0)
Monocytes Absolute: 0.9 10*3/uL (ref 0.1–1.0)
Monocytes Relative: 12.8 % — ABNORMAL HIGH (ref 3.0–12.0)
Neutro Abs: 4.1 10*3/uL (ref 1.4–7.7)
Neutrophils Relative %: 59.1 % (ref 43.0–77.0)
Platelets: 286 10*3/uL (ref 150.0–400.0)
RBC: 4.28 Mil/uL (ref 4.22–5.81)
RDW: 14.1 % (ref 11.5–15.5)
WBC: 7 10*3/uL (ref 4.0–10.5)

## 2018-07-24 LAB — COMPREHENSIVE METABOLIC PANEL
ALT: 22 U/L (ref 0–53)
AST: 20 U/L (ref 0–37)
Albumin: 4.3 g/dL (ref 3.5–5.2)
Alkaline Phosphatase: 86 U/L (ref 39–117)
BUN: 13 mg/dL (ref 6–23)
CO2: 32 mEq/L (ref 19–32)
Calcium: 9.2 mg/dL (ref 8.4–10.5)
Chloride: 100 mEq/L (ref 96–112)
Creatinine, Ser: 0.7 mg/dL (ref 0.40–1.50)
GFR: 115.59 mL/min (ref >60.00)
Glucose, Bld: 98 mg/dL (ref 70–99)
Potassium: 4.6 mEq/L (ref 3.5–5.1)
Sodium: 140 mEq/L (ref 135–145)
Total Bilirubin: 0.8 mg/dL (ref 0.2–1.2)
Total Protein: 6.7 g/dL (ref 6.0–8.3)

## 2018-07-24 MED ORDER — METOPROLOL TARTRATE 25 MG PO TABS: ORAL_TABLET | ORAL | 1 refills | Status: DC

## 2018-07-24 MED ORDER — XARELTO 20 MG PO TABS: 20.0000 mg | ORAL_TABLET | Freq: Every day | ORAL | 6 refills | Status: DC

## 2018-07-24 MED ORDER — ATORVASTATIN CALCIUM 40 MG PO TABS: 40.0000 mg | ORAL_TABLET | Freq: Every day | ORAL | 6 refills | Status: DC

## 2018-07-24 MED ORDER — LISINOPRIL 40 MG PO TABS: 40.0000 mg | ORAL_TABLET | Freq: Every day | ORAL | 6 refills | Status: DC

## 2018-07-24 MED ORDER — DIGOXIN 125 MCG PO TABS: 0.1250 mg | ORAL_TABLET | Freq: Every day | ORAL | 6 refills | Status: DC

## 2018-07-24 MED ORDER — PANTOPRAZOLE SODIUM 40 MG PO TBEC: 40.0000 mg | DELAYED_RELEASE_TABLET | Freq: Two times a day (BID) | ORAL | 6 refills | Status: DC

## 2018-07-24 NOTE — Patient Instructions (Signed)
Start taking only HALF of a 25mg  metoprolol (lopressor) tab TWICE per day.

## 2018-07-24 NOTE — Progress Notes (Signed)
OFFICE VISIT  07/24/2018   CC:  Chief Complaint  Patient presents with  . Multiple issues   HPI:    Patient is a 58 y.o. Caucasian male who presents accompanied by his wife for dizziness. Pt describes approx 10-14d hx of intermittent periods of lightheadedness upon standing, hard to tell how long it lasts.  He says low bp at rehab when he was dizzy, not clear how consistent this was. No falling.  No vertigo or nausea.  No syncope.    Additional recent med hx: This is the first time I've seen him since he had CVA-->  CVA: admitted 06/06/18 for AMS and slurred speech, found to have evolving large right subacute MCA cva.  Suspected cardio-embolic etiology given his hx of a-fib w/out anticoagulation Says he doesn't feel weak at this time on either side.  He uses a walker to assist ambulation but says this is mainly due to R hip pain/instability. Having some compensatory L knee pain. His wife agrees that he doesn't seem to be weak on one side or the other. He had some pharyngeal dysphagia in hosp s/p CVA, had to get an OG tube, swallowing studay 06/15/18-->mechanical soft, honey thickened diet recommended and he has been doing fine with this, esp with OG tube out. No cough with eating, no gagging, no recent SOB.  No fevers. He required intubation for aspiration-induced acute resp failure while in hosp for his CVA.  Has chronic a-fib: asymptomatic.  Not anticoagulated b/c of ETOH abuse and high fall risk, + uncertain/unconfirmed hx of UGI bleed on anticoag in the past-->pt and his wife deny an knowledge of this.  Also, as of his most recent cardiology f/u on 03/21/18, patient declined to consider any plan of anticoagulation. He has quit drinking since his CVA. He was put on xarelto while in hosp for recent CVA and currently takes 20mg  qd.  NICM: hx of this per cardiology f/u note 03/2018, but they also comment that it completely resolved. Echo in hosp during his recent CVA did show EF 35-40%, mod  MR, mod/sev TR, and his medical therapy was kept the same except he was changed to lopressor bid from the toprol xl qd that he had been on prior to cva. I think this was b/c the lopressor could be crushed.  Of note, he has lost about 20 lbs since having CVA and quitting drinking. Appetite is good/PO intake good. BM's normal. Urine output normal.  He has chronic R hip pain and decreased ROM due to severe osteoarthritis (see PMH section for other details).  This affects his ambulation significantly.  He needs to walker to help with stabilization but could technically be able to walk w/out it per his report today.  ROS: no CP, no SOB, no wheezing, no cough, no HAs, no rashes, no melena/hematochezia.  No polyuria or polydipsia.   No LE swelling, no joint swelling or redness.      Past Medical History:  Diagnosis Date  . Alcohol abuse    ongoing as of 07/2017  . Arthritis   . Atrial fibrillation ([place])    Not candidate for anticoagulation b/c of GI bleeding; had to get transfused.  Holding off on ASA until patient quits drinking.  . CVA (cerebral vascular accident) ([place]) 05/2018   large R MCA territory infarct.  CTA neg for large cerebral vessel occlusion.  R ICA 70% stenosis, L clear.  . Gastric ulcer    hx of  . Gout    Usually  MTP joint  . History of digital clubbing    "all my adult life"--CXR 08/2016 = bronchitic changes o/w normal.  . Hyperkalemia 04/2017   Normalized with decreasing lisinopril from 40 mg qd to 20 mg qd.  . Hyperlipidemia 07/2016   Holding off on statin until pt quits drinking.  Marland Kitchen Hypertension   . Nonischemic cardiomyopathy ([place])    resolved  . Nonrheumatic mitral valve regurgitation   . Osteoarthritis of right hip    Scheduled for surgery 05/11/15 but he then declined to get the surgery.  . Tobacco abuse    Quit 2014  . Type II diabetes mellitus ([place]) 07/2016   Dx'd by two fasting glucoses > 126 (Hb a1c 6.2% at that time).  Holding off on ASA until patient  quits drinking.    Past Surgical History:  Procedure Laterality Date  . CARDIOVASCULAR STRESS TEST    . CARDIOVERSION     DC  . TONSILLECTOMY    . TRANSTHORACIC ECHOCARDIOGRAM  06/08/2018   (in context of acute CVA): EF 35-40%, mild/mod MR, mod/sev TR, severely dilated LA.   Of note: he is NOT taking the seroquel listed below Outpatient Medications Prior to Visit  Medication Sig Dispense Refill  . allopurinol (ZYLOPRIM) 100 MG tablet 2 tabs po qd (Patient taking differently: Take 200 mg by mouth daily. ) 60 tablet 2  . atorvastatin (LIPITOR) 40 MG tablet Place 1 tablet (40 mg total) into feeding tube daily at 6 PM for 30 days. 30 tablet 0  . digoxin (LANOXIN) 0.125 MG tablet Take 0.125 mg by mouth daily.     . folic acid (FOLVITE) 1 MG tablet     . lisinopril (ZESTRIL) 40 MG tablet     . ondansetron (ZOFRAN) 4 MG tablet Take 1 tablet (4 mg total) by mouth every 8 (eight) hours as needed for nausea or vomiting. 20 tablet 0  . pantoprazole (PROTONIX) 40 MG tablet Take 40 mg by mouth 2 (two) times daily.  0  . XARELTO 20 MG TABS tablet     . metoprolol tartrate (LOPRESSOR) 25 MG tablet Take 1 tablet (25 mg total) by mouth 2 (two) times daily for 30 days. 60 tablet 0  . glucose blood (CONTOUR NEXT TEST) test strip Use to check blood sugar once daily. (Patient not taking: Reported on 07/24/2018) 100 each 12  . MICROLET LANCETS MISC Use to check blood sugar once daily (Patient not taking: Reported on 07/24/2018) 100 each 11  . apixaban (ELIQUIS) 5 MG TABS tablet Take 1 tablet (5 mg total) by mouth 2 (two) times daily for 30 days. 60 tablet 0  . QUEtiapine (SEROQUEL) 25 MG tablet Take 1 tablet (25 mg total) by mouth 2 (two) times daily for 30 days. 60 tablet 0   No facility-administered medications prior to visit.     No Known Allergies  ROS As per HPI  PE: Blood pressure 101/66, pulse 79, temperature (!) 97.5 F (36.4 C), temperature source Temporal, resp. rate 16, height 5\' 8"  (1.727  m), weight 155 lb 12.8 oz (70.7 kg), SpO2 93 %. Body mass index is 23.69 kg/m.  Gen: Alert, well appearing.  Patient is oriented to person, place, time, and situation. BHA:LPFX: no injection, icteris, swelling, or exudate.  EOMI, PERRLA. Mouth: lips without lesion/swelling.  Oral mucosa pink and moist. Oropharynx without erythema, exudate, or swelling.  CV: irreg irreg, rate approx 70s, no m/r. Chest is clear, no wheezing or rales. Normal symmetric air entry throughout both  lung fields. No chest wall deformities or tenderness. ABD: soft, NT/ND EXT: no cyanosis.  no edema.  SKIN: no pallor or jaundice or rash. Neuro: CN 2-12 intact bilaterally, strength 5/5 in proximal and distal upper extremities and lower extremities bilaterally.  No tremor.  FNF normal bilat.  Cannot test for ataxia due to R hip pain and it's affect on his ambulation.  No pronator drift. R hip: ROM in all motions significantly impaired due to pain/stiffness.  Otherwise no joint swelling or abnormalities of ROM.  LABS:  No results found for: VITAMINB12    Chemistry      Component Value Date/Time   NA 143 06/16/2018 0310   K 3.5 06/16/2018 0310   CL 105 06/16/2018 0310   CO2 27 06/16/2018 0310   BUN 18 06/16/2018 0310   CREATININE 0.79 06/16/2018 0310      Component Value Date/Time   CALCIUM 9.0 06/16/2018 0310   ALKPHOS 57 06/08/2018 1006   AST 30 06/08/2018 1006   ALT 15 06/08/2018 1006   BILITOT 0.6 06/08/2018 1006     Lab Results  Component Value Date   WBC 10.7 (H) 06/16/2018   HGB 13.9 06/16/2018   HCT 43.2 06/16/2018   MCV 93.3 06/16/2018   PLT 385 06/16/2018   Lab Results  Component Value Date   INR 1.1 06/06/2018   Lab Results  Component Value Date   CHOL 198 06/07/2018   HDL 81 06/07/2018   LDLCALC 103 (H) 06/07/2018   TRIG 72 06/07/2018   CHOLHDL 2.4 06/07/2018   Lab Results  Component Value Date   TSH 1.21 07/31/2017   Lab Results  Component Value Date   DIGOXIN 0.6 (L)  06/11/2018   Lab Results  Component Value Date   HGBA1C 5.5 06/07/2018    IMPRESSION AND PLAN:  1) Dizziness, sounds exclusively orthostatic. Will decrease his lopressor to 1/2 of 25mg  tab bid. His 20+ lb wt loss over the last couple months may be contributing to lower dose needs. Pt and wife have been suspicious in past trial of xarelto that it was causing the dizziness, but seem somewhat confused about this today. I kept him on all meds today.  2) Acute R CVA, likely cardio-embolic. He is now on xarelto 20mg  qd. No signif neuro deficit at this time. He has quit drinking.  Swallowing function appears to be sufficient at this time as long as he continues to eat mechanical soft diet, thickened liquids, small sips, chew food well, etc. Has neurology f/u 08/15/18 ([facility]).  3) Chronic a-fib: lowering lopressor dose today, continue digoxin same. HR well controlled and he is asymptomatic. Xarelto 20mg  qd.  We'll get him back in with his cardiologist in the near future.  4) Nonischemic CM: EF 35-40% with tricuspid and mitral regurg (echo 05/2018). Med mgmt right now (w/out any diuretic) going ok.   Cardiology f/u to be arranged in near future once pt gets a bit more functional and overall stable.  5) ETOH abuse: quit at the time of his CVA about 7 wks ago. Continue folate and thiamine.  6) Chronic R hip pain: severe osteoarthritis.  Causing ambulation difficulty/gait instability-->continue use of walker.  We will check into the status of the home health that pt and his wife were told will be arranged. He just got home 4-5 d/a so it could just be a matter of time.  An After Visit Summary was printed and given to the patient.  FOLLOW UP: Return in about  2 weeks (around 08/07/2018) for f/u dizziness/BP.  Signed:  Crissie Sickles, MD           07/24/2018

## 2018-07-30 ENCOUNTER — Telehealth: Payer: Self-pay | Admitting: Family Medicine

## 2018-07-30 NOTE — Telephone Encounter (Signed)
Copied from [place] [phone]. Topic: Quick Communication - Home Health Verbal Orders >> Jul 30, 2018 12:38 PM Erick Blinks wrote: Caller/Agency: [facility] Number: [phone] Requesting OT/PT/Skilled Nursing/Social Work/Speech Therapy: Skilled nursing - aftercare for hypertension/stroke  Frequency:  1 week 1 2 week 3

## 2018-07-30 NOTE — Telephone Encounter (Signed)
Contacted [facility] and advised okay w/ skilled nursing.

## 2018-07-30 NOTE — Telephone Encounter (Signed)
Yes, this is good!

## 2018-08-01 ENCOUNTER — Ambulatory Visit: Payer: BLUE CROSS/BLUE SHIELD | Admitting: Family Medicine

## 2018-08-06 ENCOUNTER — Telehealth: Payer: Self-pay | Admitting: Family Medicine

## 2018-08-06 NOTE — Telephone Encounter (Signed)
Okay for verbal orders. 

## 2018-08-06 NOTE — Telephone Encounter (Signed)
Received hard copy for verbal. PCP will review and sign, if appropriate.

## 2018-08-06 NOTE — Telephone Encounter (Signed)
Home Health Verbal Orders - Caller/Agency: [facility]/ [facility] Callback Number: [phone] secure line  Requesting OT/PT/Skilled Nursing/Social Work/Speech Therapy: Home health PT Frequency: 1x foe 1wk, 2x for 4wks, 1x for 3wks

## 2018-08-06 NOTE — Telephone Encounter (Signed)
yes

## 2018-08-07 ENCOUNTER — Ambulatory Visit (INDEPENDENT_AMBULATORY_CARE_PROVIDER_SITE_OTHER): Payer: BLUE CROSS/BLUE SHIELD | Admitting: Family Medicine

## 2018-08-07 ENCOUNTER — Encounter: Payer: Self-pay | Admitting: Family Medicine

## 2018-08-07 ENCOUNTER — Other Ambulatory Visit: Payer: Self-pay

## 2018-08-07 VITALS — BP 88/49 | HR 53 | Temp 98.0°F | Resp 16 | Ht 68.0 in | Wt 162.0 lb

## 2018-08-07 DIAGNOSIS — K219 Gastro-esophageal reflux disease without esophagitis: Secondary | ICD-10-CM

## 2018-08-07 DIAGNOSIS — E785 Hyperlipidemia, unspecified: Secondary | ICD-10-CM

## 2018-08-07 DIAGNOSIS — Z8673 Personal history of transient ischemic attack (TIA), and cerebral infarction without residual deficits: Secondary | ICD-10-CM

## 2018-08-07 DIAGNOSIS — R42 Dizziness and giddiness: Secondary | ICD-10-CM

## 2018-08-07 DIAGNOSIS — Z7901 Long term (current) use of anticoagulants: Secondary | ICD-10-CM

## 2018-08-07 DIAGNOSIS — M1611 Unilateral primary osteoarthritis, right hip: Secondary | ICD-10-CM

## 2018-08-07 DIAGNOSIS — I482 Chronic atrial fibrillation, unspecified: Secondary | ICD-10-CM

## 2018-08-07 DIAGNOSIS — E119 Type 2 diabetes mellitus without complications: Secondary | ICD-10-CM

## 2018-08-07 DIAGNOSIS — M109 Gout, unspecified: Secondary | ICD-10-CM

## 2018-08-07 DIAGNOSIS — I110 Hypertensive heart disease with heart failure: Secondary | ICD-10-CM

## 2018-08-07 DIAGNOSIS — I428 Other cardiomyopathies: Secondary | ICD-10-CM

## 2018-08-07 DIAGNOSIS — Z87891 Personal history of nicotine dependence: Secondary | ICD-10-CM

## 2018-08-07 DIAGNOSIS — G8194 Hemiplegia, unspecified affecting left nondominant side: Secondary | ICD-10-CM

## 2018-08-07 DIAGNOSIS — F1010 Alcohol abuse, uncomplicated: Secondary | ICD-10-CM

## 2018-08-07 DIAGNOSIS — I4891 Unspecified atrial fibrillation: Secondary | ICD-10-CM

## 2018-08-07 DIAGNOSIS — I959 Hypotension, unspecified: Secondary | ICD-10-CM

## 2018-08-07 DIAGNOSIS — I509 Heart failure, unspecified: Secondary | ICD-10-CM

## 2018-08-07 MED ORDER — LISINOPRIL 40 MG PO TABS: ORAL_TABLET | ORAL | 6 refills | Status: DC

## 2018-08-07 NOTE — Progress Notes (Signed)
OFFICE VISIT  08/07/2018   CC:  Chief Complaint  Patient presents with  . Follow-up    dizziness/BP    HPI:    Patient is a 58 y.o. Caucasian male who presents for 2 week f/u orthostatic dizziness. Last visit I decreased his lopressor to 1/2 of 25mg  tab bid.  Still feeling dizzy a lot of the time, particularly after sitting up or standing up. Home bp's still 90s/50s, not much diff sitting compared to standing. Wt is 7 lbs up compared to wt here 2 wks ago. Some generalized weakness. Some LE edema noted.  Goes back down when in bed for sleep. Wife says he is doing pretty well with low Na diet. No SOB or DOE.  ROS: no CP, no gagging/choking/coughing with eating.  No fevers.  No focal weakness. Says legs in general are both somewhat weak.    Past Medical History:  Diagnosis Date  . Alcohol abuse    ongoing as of 07/2017.  Quit after CVA 05/2018  . Arthritis   . Atrial fibrillation ([place])    Not candidate for anticoagulation b/c of GI bleeding; had to get transfused.  Then, pt had CVA 05/2018 and he was put on xarelto and he quit drinking.  . CVA (cerebral vascular accident) ([place]) 05/2018   large R MCA territory infarct.  CTA neg for large cerebral vessel occlusion.  R ICA 70% stenosis, L clear.  . Gastric ulcer    hx of--unclear (pt and wife deny this)  . Gout    Usually MTP joint  . History of digital clubbing    "all my adult life"--CXR 08/2016 = bronchitic changes o/w normal.  . Hyperkalemia 04/2017   Normalized with decreasing lisinopril from 40 mg qd to 20 mg qd.  . Hyperlipidemia 07/2016   Holding off on statin until pt quits drinking.  Back on statin after CVA 05/2018  . Hypertension   . Nonischemic cardiomyopathy ([place]) 2015; 2020   2015: "resolved" (w/medical therapy?).  05/2018 EF 35-40%, with TR and MR and ++LAE.  Marland Kitchen Nonrheumatic mitral valve regurgitation   . Osteoarthritis of right hip    Scheduled for surgery 05/11/15 but he then declined to get the surgery.  .  Tobacco abuse    Quit 2014  . Type II diabetes mellitus ([place]) 07/2016   Dx'd by two fasting glucoses > 126 (Hb a1c 6.2% at that time).      Past Surgical History:  Procedure Laterality Date  . CARDIAC CATHETERIZATION  2015   Normal coronaries  . CARDIOVERSION     DCCV 2015  . TONSILLECTOMY    . TRANSTHORACIC ECHOCARDIOGRAM  06/08/2018   (in context of acute CVA): EF 35-40%, mild/mod MR, mod/sev TR, severely dilated LA.    Outpatient Medications Prior to Visit  Medication Sig Dispense Refill  . allopurinol (ZYLOPRIM) 100 MG tablet 2 tabs po qd (Patient taking differently: Take 200 mg by mouth daily. ) 60 tablet 2  . atorvastatin (LIPITOR) 40 MG tablet Place 1 tablet (40 mg total) into feeding tube daily at 6 PM. 30 tablet 6  . digoxin (LANOXIN) 0.125 MG tablet Take 1 tablet (0.125 mg total) by mouth daily. 30 tablet 6  . folic acid (FOLVITE) 1 MG tablet     . glucose blood (CONTOUR NEXT TEST) test strip Use to check blood sugar once daily. 100 each 12  . MICROLET LANCETS MISC Use to check blood sugar once daily 100 each 11  . ondansetron (ZOFRAN) 4 MG  tablet Take 1 tablet (4 mg total) by mouth every 8 (eight) hours as needed for nausea or vomiting. 20 tablet 0  . pantoprazole (PROTONIX) 40 MG tablet Take 1 tablet (40 mg total) by mouth 2 (two) times daily. 60 tablet 6  . XARELTO 20 MG TABS tablet Take 1 tablet (20 mg total) by mouth daily with supper. 30 tablet 6  . lisinopril (ZESTRIL) 40 MG tablet Take 1 tablet (40 mg total) by mouth daily. 30 tablet 6  . metoprolol tartrate (LOPRESSOR) 25 MG tablet 1/2 tab po bid 30 tablet 1   No facility-administered medications prior to visit.     No Known Allergies  ROS As per HPI  PE: Blood pressure (!) 88/49, pulse (!) 53, temperature 98 F (36.7 C), temperature source Temporal, resp. rate 16, height 5\' 8"  (1.727 m), weight 162 lb (73.5 kg), SpO2 99 %. Gen: Alert, tired-appearing but NAD.  Patient is oriented to person, place, time,  and situation. CV: irreg irreg, rate 60s, no m/r. Chest is clear, no wheezing or rales. Normal symmetric air entry throughout both lung fields. No chest wall deformities or tenderness. EXT: no clubbing or cyanosis.  Trace pitting edema on L>R.  No erythema.     LABS:    Chemistry      Component Value Date/Time   NA 140 07/24/2018 1142   K 4.6 07/24/2018 1142   CL 100 07/24/2018 1142   CO2 32 07/24/2018 1142   BUN 13 07/24/2018 1142   CREATININE 0.70 07/24/2018 1142      Component Value Date/Time   CALCIUM 9.2 07/24/2018 1142   ALKPHOS 86 07/24/2018 1142   AST 20 07/24/2018 1142   ALT 22 07/24/2018 1142   BILITOT 0.8 07/24/2018 1142     Lab Results  Component Value Date   WBC 7.0 07/24/2018   HGB 12.9 (L) 07/24/2018   HCT 39.1 07/24/2018   MCV 91.4 07/24/2018   PLT 286.0 07/24/2018   Lab Results  Component Value Date   HGBA1C 5.5 06/07/2018   Lab Results  Component Value Date   TSH 1.21 07/31/2017   Lab Results  Component Value Date   DIGOXIN 0.6 (L) 06/11/2018    IMPRESSION AND PLAN:  1) Hypotension, with orthostatic dizziness. Suspect this is secondary to wt loss since CVA + decreased cardiac output. We have no choice at this time but to d/c lopressor altogether and cut lisinopril dose in half (40mg  qd down to 20mg  qd). Wt is up but no sign of fluid overload on exam.  2) R MCA territory CVA about 2 mo ago: some swallowing dysfunction was noted as his only residual deficit, but he has generalized weakness and gait instability.  Requiring a walker for support/balance. PT to start at home soon.    3) A-fib, chronic: rate well controlled, no problems with anticoagulation. Watching for breakthrough RVR as he gets off toprol altogether.  He remains on digoxin, though.  4) Nonischemic CM: EF 35-40%.  Wt is up but ? Fluid wt.  No sign of volume overload on exam. He is not on any diuretic at this time.   Note #1 above: d/c lopressor and cut lisinopril in  half. Needs f/u with his cardiologist in near future.  An After Visit Summary was printed and given to the patient.  FOLLOW UP: Return in about 2 weeks (around 08/21/2018) for f/u dizziness and low bp.  Signed:  Crissie Sickles, MD  08/07/2018     

## 2018-08-07 NOTE — Patient Instructions (Addendum)
Stop your lopressor.  Take 1/2 of your 40mg  lisinopril tab once a day.  Check blood pressure and heart rate daily and write numbers down for review with me at next office visit.

## 2018-08-07 NOTE — Telephone Encounter (Signed)
PCP signed and placed forms in bin to go up front and faxed. 

## 2018-08-07 NOTE — Telephone Encounter (Signed)
Contacted [facility], [facility] and given verbal orders. Will have PCP sign and fax hard copy.

## 2018-08-09 ENCOUNTER — Telehealth: Payer: Self-pay

## 2018-08-09 NOTE — Telephone Encounter (Signed)
Received a Advertising account executive from [place], [facility]. Requesting PCP consent to add speech therapy evaluation for current swallowing issues patient is having.   Okay for additional order?

## 2018-08-09 NOTE — Telephone Encounter (Signed)
Yes

## 2018-08-09 NOTE — Telephone Encounter (Signed)
Called [facility] to speak with Joycelyn Schmid to give verbal order for speech therapy evaluation, left message on secure voicemail for order.

## 2018-08-13 ENCOUNTER — Telehealth: Payer: Self-pay

## 2018-08-13 NOTE — Telephone Encounter (Signed)
Placed in bin to go up front to be faxed.

## 2018-08-13 NOTE — Telephone Encounter (Signed)
Received order for OT, PCP will review and sign, if appropriate.

## 2018-08-13 NOTE — Telephone Encounter (Signed)
Signed and put on your desk

## 2018-08-15 ENCOUNTER — Other Ambulatory Visit: Payer: Self-pay

## 2018-08-15 ENCOUNTER — Ambulatory Visit (INDEPENDENT_AMBULATORY_CARE_PROVIDER_SITE_OTHER): Payer: BC Managed Care – PPO | Admitting: Adult Health

## 2018-08-15 ENCOUNTER — Encounter: Payer: Self-pay | Admitting: Adult Health

## 2018-08-15 VITALS — BP 106/67 | HR 90 | Temp 97.1°F | Ht 68.0 in | Wt 137.2 lb

## 2018-08-15 DIAGNOSIS — I480 Paroxysmal atrial fibrillation: Secondary | ICD-10-CM

## 2018-08-15 DIAGNOSIS — I63411 Cerebral infarction due to embolism of right middle cerebral artery: Secondary | ICD-10-CM

## 2018-08-15 DIAGNOSIS — E785 Hyperlipidemia, unspecified: Secondary | ICD-10-CM

## 2018-08-15 DIAGNOSIS — R471 Dysarthria and anarthria: Secondary | ICD-10-CM

## 2018-08-15 DIAGNOSIS — I10 Essential (primary) hypertension: Secondary | ICD-10-CM

## 2018-08-15 DIAGNOSIS — I639 Cerebral infarction, unspecified: Secondary | ICD-10-CM

## 2018-08-15 NOTE — Patient Instructions (Signed)
Continue Xarelto (rivaroxaban) daily  and lipitor  for secondary stroke prevention  Continue to follow up with PCP regarding cholesterol and blood pressure management   Continue to follow with cardiology for atrial fibrillation and Xarelto management  Continue to work with therapies and will likely need outpatient therapy after  Continue to monitor blood pressure at home  Maintain strict control of hypertension with blood pressure goal below 130/90, diabetes with hemoglobin A1c goal below 6.5% and cholesterol with LDL cholesterol (bad cholesterol) goal below 70 mg/dL. I also advised the patient to eat a healthy diet with plenty of whole grains, cereals, fruits and vegetables, exercise regularly and maintain ideal body weight.  Followup in the future with me in 3 months or call earlier if needed       Thank you for coming to see [place] at [facility]. I hope we have been able to provide you high quality care today.  You may receive a patient satisfaction survey over the next few weeks. We would appreciate your feedback and comments so that we may continue to improve ourselves and the health of our patients.

## 2018-08-15 NOTE — Progress Notes (Signed)
[facility] [address] [place]. [place] [address] [phone]       HOSPITAL FOLLOW UP NOTE  [name] Date of Birth:  08/04/1960 Medical Record Number:  [id number]   Reason for Referral:  hospital stroke follow up    CHIEF COMPLAINT:  Chief Complaint  Patient presents with   Hospitalization Follow-up    Stroke f/u. Alone. Treatment room. Patient mentioned that he has some numbness and pain to his left arm.     HPI: Robert [name] being seen today for in office hospital follow-up regarding large right MCA infarct secondary to A. fib not on AC on 06/06/2018.  History obtained from patient, wife and chart review. Reviewed all radiology images and labs personally.  Robert [name] is a 58 y.o. male with history of Etoh Abuse, Afib not on [place] d/t GIB, DM, peptic ulcer, hypertension, hyperlipidemia, gout who presented with AMS and dysarthria. Found to have large subacute RMCA infarct secondary to A. fib not on AC versus right ICA high-grade stenosis with resultant severe dysarthria.  MRI unable to be performed due to metal insult.  Vessel imaging showed 70% proximal right ICA stenosis and occlusion of right vertebral artery at its origin.  2D echo showed an EF of 35 to 40%.  LDL 103 and A1c 5.5.  It was recommended to initiate Eliquis due to atrial fibrillation and recent stroke.  Initiated atorvastatin 40 mg daily for HLD management.  Current tobacco use of smoking cessation counseling provided.  Repeat CT head on 06/07/2018 showed acute hypoxic decompensation and respiratory distress ultimately needing intubation secondary to aspiration pneumonia versus pneumonitis with successful extubation 06/09/2018.  Initially failed swallow eval with need of NG tube but was able to be discharged on modified diet.  Due to residual deficits, he was discharged to SNF for ongoing therapies.  He has since been discharged home with residual dysarthria, word finding difficulty and  dysphasia.  Home OT evaluated but discontinued as it was felt as though he would benefit more from PT/ST which will start this Friday.  Currently ambulating with rolling walker due to dizziness/balance difficulties but denies any falls.  He has been experiencing increased coughing while eating.  He is eating regular food at home but wife does ensure pieces are caught up small.  He has remained on nectar thick liquid which is typically when he will start coughing.  Wife states he uses a coffee stirring straw to ensure he does not drink too much at once.  He has been having blood pressure medications adjusted by PCP due to potential orthostatic dizziness and hypotension likely secondary to weight loss since stroke and decreased cardiac output.  He does endorse improvement of blood pressures with medication changes and decreased dizziness episodes.  He has completely quit drinking alcohol since discharge.  Currently on Xarelto without bleeding or bruising (discharged on Eliquis after hospitalization - ?  Change in medication during SNF).  Continues on atorvastatin without myalgias.  Denies new or worsening stroke/TIA symptoms.     ROS:   14 system review of systems performed and negative with exception of speech difficulty, swallowing difficulty, dizziness and gait difficulty  PMH:  Past Medical History:  Diagnosis Date   Alcohol abuse    ongoing as of 07/2017.  Quit after CVA 05/2018   Arthritis    Atrial fibrillation ([place])    Not candidate for anticoagulation b/c of GI bleeding; had to get transfused.  Then, pt had CVA 05/2018 and he  was put on xarelto and he quit drinking.   CVA (cerebral vascular accident) ([place]) 05/2018   large R MCA territory infarct.  CTA neg for large cerebral vessel occlusion.  R ICA 70% stenosis, L clear.   Gastric ulcer    hx of--unclear (pt and wife deny this)   Gout    Usually MTP joint   History of digital clubbing    "all my adult life"--CXR 08/2016 = bronchitic  changes o/w normal.   Hyperkalemia 04/2017   Normalized with decreasing lisinopril from 40 mg qd to 20 mg qd.   Hyperlipidemia 07/2016   Holding off on statin until pt quits drinking.  Back on statin after CVA 05/2018   Hypertension    Nonischemic cardiomyopathy ([place]) 2015; 2020   2015: "resolved" (w/medical therapy?).  05/2018 EF 35-40%, with TR and MR and ++LAE.   Nonrheumatic mitral valve regurgitation    Osteoarthritis of right hip    Scheduled for surgery 05/11/15 but he then declined to get the surgery.   Tobacco abuse    Quit 2014   Type II diabetes mellitus ([place]) 07/2016   Dx'd by two fasting glucoses > 126 (Hb a1c 6.2% at that time).      PSH:  Past Surgical History:  Procedure Laterality Date   CARDIAC CATHETERIZATION  2015   Normal coronaries   CARDIOVERSION     DCCV 2015   TONSILLECTOMY     TRANSTHORACIC ECHOCARDIOGRAM  06/08/2018   (in context of acute CVA): EF 35-40%, mild/mod MR, mod/sev TR, severely dilated LA.    Social History:  Social History   Socioeconomic History   Marital status: Married    Spouse name: Not on file   Number of children: Not on file   Years of education: Not on file   Highest education level: Not on file  Occupational History   Not on file  Social Needs   Financial resource strain: Not on file   Food insecurity    Worry: Not on file    Inability: Not on file   Transportation needs    Medical: Not on file    Non-medical: Not on file  Tobacco Use   Smoking status: Former Smoker    Packs/day: 1.00    Years: 5.00    Pack years: 5.00    Types: Cigarettes    Quit date: 01/10/2013    Years since quitting: 5.5   Smokeless tobacco: Never Used  Substance and Sexual Activity   Alcohol use: Yes    Comment: drinks liquor daily   Drug use: No   Sexual activity: Not on file  Lifestyle   Physical activity    Days per week: Not on file    Minutes per session: Not on file   Stress: Not on file  Relationships     Social connections    Talks on phone: Not on file    Gets together: Not on file    Attends religious service: Not on file    Active member of club or organization: Not on file    Attends meetings of clubs or organizations: Not on file    Relationship status: Not on file   Intimate partner violence    Fear of current or ex partner: Not on file    Emotionally abused: Not on file    Physically abused: Not on file    Forced sexual activity: Not on file  Other Topics Concern   Not on file  Social History Narrative  Married, 2 adult children.   Educ: 11th grade   Occup: "out of work".  Worked for [employer]--was let go for not abiding by [employer] per pt/wife.   Tob: former--quit 2014.  "Of and on prior".   Alc: currently drinks a pint and 1 and 1/2 pint of whiskey per day x years---long hx of alcohol abuse.   Drugs: none.    Family History:  Family History  Problem Relation Age of Onset   Diabetes Mother    Congestive Heart Failure Mother    Alcohol abuse Father    Hypertension Father    Stroke Father    Heart disease Paternal Grandfather     Medications:   Current Outpatient Medications on File Prior to Visit  Medication Sig Dispense Refill   allopurinol (ZYLOPRIM) 100 MG tablet 2 tabs po qd (Patient taking differently: Take 200 mg by mouth daily. ) 60 tablet 2   atorvastatin (LIPITOR) 40 MG tablet Place 1 tablet (40 mg total) into feeding tube daily at 6 PM. 30 tablet 6   digoxin (LANOXIN) 0.125 MG tablet Take 1 tablet (0.125 mg total) by mouth daily. 30 tablet 6   folic acid (FOLVITE) 1 MG tablet      glucose blood (CONTOUR NEXT TEST) test strip Use to check blood sugar once daily. 100 each 12   lisinopril (ZESTRIL) 40 MG tablet 1/2 tab po qd 30 tablet 6   MICROLET LANCETS MISC Use to check blood sugar once daily 100 each 11   ondansetron (ZOFRAN) 4 MG tablet Take 1 tablet (4 mg total) by mouth every 8 (eight) hours as needed for nausea or  vomiting. 20 tablet 0   pantoprazole (PROTONIX) 40 MG tablet Take 1 tablet (40 mg total) by mouth 2 (two) times daily. 60 tablet 6   XARELTO 20 MG TABS tablet Take 1 tablet (20 mg total) by mouth daily with supper. 30 tablet 6   No current facility-administered medications on file prior to visit.     Allergies:  No Known Allergies   Physical Exam  Vitals:   08/15/18 1508  BP: 106/67  Pulse: 90  Temp: (!) 97.1 F (36.2 C)  TempSrc: Oral  Weight: 137 lb 3.2 oz (62.2 kg)  Height: 5\' 8"  (1.727 m)   Body mass index is 20.86 kg/m. No exam data present  Depression screen [facility] 2/9 08/15/2018  Decreased Interest 0  Down, Depressed, Hopeless 0  PHQ - 2 Score 0     General: Frail middle-aged but elderly-appearing Caucasian male, seated, in no evident distress Head: head normocephalic and atraumatic.   Neck: supple with no carotid or supraclavicular bruits Cardiovascular: irregular rate and rhythm, no murmurs Musculoskeletal: no deformity; limited right hip ROM due to pain Skin:  no rash/petichiae Vascular:  Normal pulses all extremities   Neurologic Exam Mental Status: Awake and fully alert.   Mild dysarthria with word finding difficulty and stuttering.  Oriented to place and time. Recent and remote memory intact. Attention span, concentration and fund of knowledge slightly diminished. Mood and affect appropriate.  Cranial Nerves: Fundoscopic exam reveals sharp disc margins. Pupils equal, briskly reactive to light. Extraocular movements full without nystagmus. Visual fields full to confrontation. Hearing intact. Facial sensation intact.  Mild left lower facial paralysis, tongue, palate moves normally and symmetrically.  Motor: Normal bulk and tone.  Generalized weakness throughout all extremities.  Unable to adequately test RLE hip flexor due to hip pain Sensory.: intact to touch , pinprick , position  and vibratory sensation.  Coordination: Rapid alternating movements normal in all  extremities. Finger-to-nose and heel-to-shin performed accurately bilaterally. Gait and Station: Arises from chair with difficulty. Stance is normal. Gait demonstrates normal stride length with favoring of right leg due to hip pain and imbalance with use of rolling walker Reflexes: 1+ and symmetric. Toes downgoing.     NIHSS  3 Modified Rankin  3 CHA2DS2-VASc 5 HAS-BLED 2    Diagnostic Data (Labs, Imaging, Testing)  Ct Angio Head W Or Wo Contrast Ct Angio Neck W Or Wo Contrast 06/08/2018 IMPRESSION:   CT HEAD  1. Continued interval evolution of large right MCA territory infarct, stable in size and distribution from previous. Associated mild regional mass effect with trace 2 mm right-to-left shift. Probable associated petechial hemorrhage without evidence for hemorrhagic transformation.  2. No other new acute intracranial abnormality.  3. Underlying atrophy with moderate chronic small vessel ischemic disease, with additional multiple chronic infarcts as above.   CTA HEAD AND NECK  1. Negative CTA for acute large vessel occlusion.  2. 70% atheromatous stenosis at the proximal right ICA. Left carotid artery system widely patent within the neck.  3. Occlusion of the right vertebral artery at its origin. Dominant left vertebral artery widely patent within the neck. Moderate proximal left V4 stenosis.  4. Moderate to large layering bilateral pleural effusions with associated atelectasis and/or consolidation.    Ct Angio Chest Pe W Or Wo Contrast 06/07/2018 IMPRESSION:  1. No PE identified on today's exam. The main pulmonary artery is dilated which can be seen in patients with elevated PA pressures.  2. Extensive bilateral multifocal consolidation as detailed above concerning for multifocal pneumonia or aspiration. Some debris is noted within the trachea.  3. The endotracheal tube terminates somewhat high at the level of the thoracic inlet. Repositioning should be considered.  4.  Small to moderate-sized bilateral pleural effusions.  5. Enteric tube terminates in the stomach.  6. Cardiomegaly. There is reflux of contrast in the IVC consistent with some degree of underlying cardiac dysfunction. Aortic Atherosclerosis (ICD10-I70.0).   Dg Chest Port 1 View 06/09/2018 IMPRESSION:  1. Well-positioned support structures.  2. Decreased right perihilar opacity, suggesting improving pulmonary edema or improving atelectasis.  3. Stable small right pleural effusion.  4. Similar patchy left retrocardiac opacity.   Dg Abd Portable 1v 06/07/2018 IMPRESSION:  Orogastric tube with the tip projecting over the stomach.    Transthoracic Echocardiogram  Reduced ejection fraction of 35 to 40%.  No wall motion abnormalities.  EKG - Afib RVR  EEG  IMPRESSION:  This recording shows moderate global slowing indicating a moderate global encephalopathy. However, no epileptiform discharges are observed.     ASSESSMENT: Robert [name] is a 58 y.o. year old male presented with altered mental status and dysarthria with findings of right large MCA infarct on 06/06/2018 secondary to likely atrial fibrillation not on AC. Vascular risk factors include right ICA high-grade stenosis, EtOH use, A. fib not on AC, DM, HTN, HLD and tobacco use.  Residual deficits of dysarthria, dysphasia, generalized weakness and balance difficulty.    PLAN:  1. Right MCA infarct: Continue Xarelto (rivaroxaban) daily  and atorvastatin for secondary stroke prevention. Maintain strict control of hypertension with blood pressure goal below 130/90, diabetes with hemoglobin A1c goal below 6.5% and cholesterol with LDL cholesterol (bad cholesterol) goal below 70 mg/dL.  I also advised the patient to eat a healthy diet with plenty of whole grains, cereals, fruits and vegetables, exercise regularly  with at least 30 minutes of continuous activity daily and maintain ideal body weight. 2. Residual deficits: We will start  home PT/SLP with discussion regarding outpatient therapy once home therapies completed.  Advise further evaluation with increased coughing after liquid intake by SLP this Friday.  May need repeat swallowing study if possible aspiration noted. 3. Atrial fibrillation: Continue Xarelto and ongoing follow-up by cardiology for management and monitoring 4. HTN with recent hypotension: Advised to continue current treatment regimen.  Today's BP stable.  Advised to continue to monitor at home along with continued follow-up with PCP for management 5. HLD: Advised to continue current treatment regimen along with continued follow-up with PCP for future prescribing and monitoring of lipid panel 6. DMII: Advised to continue to monitor glucose levels at home along with continued follow-up with PCP for management and monitoring 7. EtOH/tobacco use: Congratulated on abstinence of substance abuse and highly encouraged continuation    Follow up in 3 months or call earlier if needed   Greater than 50% of time during this 45 minute visit was spent on counseling, explanation of diagnosis of right MCA infarct, reviewing risk factor management of atrial fibrillation, HTN, HLD, DM and prior EtOH/tobacco use, planning of further management along with potential future management, and discussion with patient and family answering all questions.    Venancio Poisson, AGNP-BC  [facility] [address] [place], [place] [address]  Phone [phone] Fax [phone] Note: This document was prepared with digital dictation and possible smart phrase technology. Any transcriptional errors that result from this process are unintentional.

## 2018-08-17 ENCOUNTER — Telehealth: Payer: Self-pay

## 2018-08-17 NOTE — Telephone Encounter (Signed)
Left message on secure VM advising okay to hold skilled nursing.

## 2018-08-17 NOTE — Telephone Encounter (Signed)
Received voicemail that pt's family is requesting skilled nursing to be on hold. He still has OT and PT.   Please advise if okay? Will call Scott and notify. Okay to leave message on secure voicemail.

## 2018-08-17 NOTE — Telephone Encounter (Signed)
Yes this is ok 

## 2018-08-20 ENCOUNTER — Telehealth: Payer: Self-pay | Admitting: Family Medicine

## 2018-08-20 NOTE — Telephone Encounter (Signed)
Contacted Anna Genre, verbal orders given and approved by PCP.

## 2018-08-20 NOTE — Telephone Encounter (Signed)
Yes, all good.

## 2018-08-20 NOTE — Progress Notes (Signed)
I agree with the above plan 

## 2018-08-20 NOTE — Telephone Encounter (Signed)
Home Health Verbal Orders - Caller/Agency:  Anna Genre Mitchell/ [facility] Number: [phone] Requesting OT/PT/Skilled Nursing/Social Work/Speech Therapy: Speech Therapy Frequency: 1 week 2, 2 week 3.

## 2018-08-20 NOTE — Telephone Encounter (Signed)
Received form via fax to approve holding skilled nursing. PCP will review and sign, if appropriate.

## 2018-08-20 NOTE — Telephone Encounter (Signed)
Signed and put in box to go up front.  

## 2018-08-21 ENCOUNTER — Telehealth: Payer: Self-pay

## 2018-08-21 ENCOUNTER — Encounter: Payer: Self-pay | Admitting: Family Medicine

## 2018-08-21 ENCOUNTER — Ambulatory Visit (INDEPENDENT_AMBULATORY_CARE_PROVIDER_SITE_OTHER): Payer: BC Managed Care – PPO | Admitting: Family Medicine

## 2018-08-21 ENCOUNTER — Other Ambulatory Visit: Payer: Self-pay

## 2018-08-21 VITALS — BP 114/79 | HR 78 | Temp 98.4°F | Resp 16 | Ht 68.0 in | Wt 161.2 lb

## 2018-08-21 DIAGNOSIS — R42 Dizziness and giddiness: Secondary | ICD-10-CM

## 2018-08-21 DIAGNOSIS — I959 Hypotension, unspecified: Secondary | ICD-10-CM | POA: Diagnosis not present

## 2018-08-21 MED ORDER — FOLIC ACID 1 MG PO TABS: ORAL_TABLET | ORAL | 3 refills | Status: DC

## 2018-08-21 NOTE — Telephone Encounter (Signed)
Received copy of orders for speech therapy. PCP will review and sign, if appropriate.

## 2018-08-21 NOTE — Telephone Encounter (Signed)
Signed and put in box to go up front.  

## 2018-08-21 NOTE — Progress Notes (Signed)
OFFICE VISIT  08/21/2018   CC:  Chief Complaint  Patient presents with  . Follow-up    dizziness, low BP   HPI:    Patient is a 58 y.o. Caucasian male who presents accompanied by his wife for 2 week f/u low bp and dizziness. Of note, he had neurologist f/u 08/15/18 and was found to be stable, no med changes made, proceed with PT/ST for help with residual dysarthria, word finding difficulty, imbalance, and generalized weakness.  Last visit he was continuing to have lots of dizziness and hypotension, so I stopped lopressor altogether and also cut lisinopril dose from 40mg  qd down to 20mg  qd. BPs at home done by PT and [facility] nursing have been around 110/70s, HR's not recalled by pt/wife. He is not having dizziness or hypotension anymore since last f/u visit.  Wants to continue with ST only, says PT is not doing anything with him that he doesn't already do at home.    Past Medical History:  Diagnosis Date  . Alcohol abuse    ongoing as of 07/2017.  Quit after CVA 05/2018  . Arthritis   . Atrial fibrillation ([place])    Not candidate for anticoagulation b/c of GI bleeding; had to get transfused.  Then, pt had CVA 05/2018 and he was put on xarelto and he quit drinking.  . CVA (cerebral vascular accident) ([place]) 05/2018   large R MCA territory infarct.  CTA neg for large cerebral vessel occlusion.  R ICA 70% stenosis, L clear.  . Gastric ulcer    hx of--unclear (pt and wife deny this)  . Gout    Usually MTP joint  . History of digital clubbing    "all my adult life"--CXR 08/2016 = bronchitic changes o/w normal.  . Hyperkalemia 04/2017   Normalized with decreasing lisinopril from 40 mg qd to 20 mg qd.  . Hyperlipidemia 07/2016   Holding off on statin until pt quits drinking.  Back on statin after CVA 05/2018  . Hypertension   . Nonischemic cardiomyopathy ([place]) 2015; 2020   2015: "resolved" (w/medical therapy?).  05/2018 EF 35-40%, with TR and MR and ++LAE.  Marland Kitchen Nonrheumatic mitral valve  regurgitation   . Osteoarthritis of right hip    Scheduled for surgery 05/11/15 but he then declined to get the surgery.  . Tobacco abuse    Quit 2014  . Type II diabetes mellitus ([place]) 07/2016   Dx'd by two fasting glucoses > 126 (Hb a1c 6.2% at that time).      Past Surgical History:  Procedure Laterality Date  . CARDIAC CATHETERIZATION  2015   Normal coronaries  . CARDIOVERSION     DCCV 2015  . TONSILLECTOMY    . TRANSTHORACIC ECHOCARDIOGRAM  06/08/2018   (in context of acute CVA): EF 35-40%, mild/mod MR, mod/sev TR, severely dilated LA.    Outpatient Medications Prior to Visit  Medication Sig Dispense Refill  . allopurinol (ZYLOPRIM) 100 MG tablet 2 tabs po qd (Patient taking differently: Take 200 mg by mouth daily. ) 60 tablet 2  . atorvastatin (LIPITOR) 40 MG tablet Place 1 tablet (40 mg total) into feeding tube daily at 6 PM. 30 tablet 6  . digoxin (LANOXIN) 0.125 MG tablet Take 1 tablet (0.125 mg total) by mouth daily. 30 tablet 6  . glucose blood (CONTOUR NEXT TEST) test strip Use to check blood sugar once daily. 100 each 12  . lisinopril (ZESTRIL) 40 MG tablet 1/2 tab po qd 30 tablet 6  .  MICROLET LANCETS MISC Use to check blood sugar once daily 100 each 11  . ondansetron (ZOFRAN) 4 MG tablet Take 1 tablet (4 mg total) by mouth every 8 (eight) hours as needed for nausea or vomiting. 20 tablet 0  . pantoprazole (PROTONIX) 40 MG tablet Take 1 tablet (40 mg total) by mouth 2 (two) times daily. 60 tablet 6  . XARELTO 20 MG TABS tablet Take 1 tablet (20 mg total) by mouth daily with supper. 30 tablet 6  . folic acid (FOLVITE) 1 MG tablet      No facility-administered medications prior to visit.     No Known Allergies  ROS As per HPI  PE: Blood pressure 114/79, pulse 78, temperature 98.4 F (36.9 C), temperature source Temporal, resp. rate 16, height 5\' 8"  (1.727 m), weight 161 lb 3.2 oz (73.1 kg), SpO2 100 %. Gen: Alert, well appearing.  Patient is oriented to person,  place, time, and situation. CV: RRR, no m/r/g.   LUNGS: CTA bilat, nonlabored resps, good aeration in all lung fields.   LABS:    Chemistry      Component Value Date/Time   NA 140 07/24/2018 1142   K 4.6 07/24/2018 1142   CL 100 07/24/2018 1142   CO2 32 07/24/2018 1142   BUN 13 07/24/2018 1142   CREATININE 0.70 07/24/2018 1142      Component Value Date/Time   CALCIUM 9.2 07/24/2018 1142   ALKPHOS 86 07/24/2018 1142   AST 20 07/24/2018 1142   ALT 22 07/24/2018 1142   BILITOT 0.8 07/24/2018 1142     Lab Results  Component Value Date   WBC 7.0 07/24/2018   HGB 12.9 (L) 07/24/2018   HCT 39.1 07/24/2018   MCV 91.4 07/24/2018   PLT 286.0 07/24/2018   Lab Results  Component Value Date   HGBA1C 5.5 06/07/2018    IMPRESSION AND PLAN:  1) Hypotension/dizziness: resolved. Continue on 1/2 of 40mg  lisinopril tab daily. Possibly will try to add low dose toprol in the future if bp allows (particularly if HR is climbing or pt is having RVR).  An After Visit Summary was printed and given to the patient.  FOLLOW UP: Return in about 2 months (around 10/21/2018) for routine chronic illness f/u.  Signed:  Crissie Sickles, MD           08/21/2018

## 2018-08-21 NOTE — Telephone Encounter (Signed)
Opened in error

## 2018-09-04 ENCOUNTER — Other Ambulatory Visit: Payer: Self-pay | Admitting: Family Medicine

## 2018-09-10 ENCOUNTER — Telehealth: Payer: Self-pay

## 2018-09-10 NOTE — Telephone Encounter (Signed)
Pts wife was told for patient to keep feet elevated, get enough fluids, if edema travels up leg, pt gains weight, becomes SOB, he needs to go to ED. She verbalized understanding. She was advised dr Anitra Lauth is off today but will be back tomorrow and that message will be sent to him. Pts wife stated she was at work today and had to take son to appt tomorrow. We can schedule this patient at 4pm Thursday or do you want to send in medication? Please advise.

## 2018-09-10 NOTE — Telephone Encounter (Signed)
ST Hershal Coria- [phone] Called to report patient has L foot swelling with pain. Pt believes it is a gout flare up. Denies SOB. Tried to call patient and there was no answer. Will try again later to schedule, get weight, BP, and HR.

## 2018-09-10 NOTE — Telephone Encounter (Signed)
x3 days. Pt does not weigh at home. Wife states BP/pulse WNL per ST. Pts wife said they are unable to do a virtual visit. She is not sure if it is gout or fluid as he has not been propping his legs very good recently. She states patient thinks it is gout.

## 2018-09-11 ENCOUNTER — Telehealth: Payer: Self-pay

## 2018-09-11 NOTE — Telephone Encounter (Signed)
Sorry but the 4:00 has already been taken. Is there another time we can work him in?

## 2018-09-11 NOTE — Telephone Encounter (Signed)
OK, no o/v needed. Pls eRx prednisone 20mg , 2 tabs po qd x 5d, #10, no RF. Make sure he has been taking his allopurinol every day as prescribed.-thx

## 2018-09-11 NOTE — Telephone Encounter (Signed)
OK to schedule for 4 on Thursday-thx

## 2018-09-11 NOTE — Telephone Encounter (Signed)
Received home health order request for OT. PCP will review and sign, if appropriate.

## 2018-09-12 ENCOUNTER — Other Ambulatory Visit: Payer: Self-pay

## 2018-09-12 MED ORDER — PREDNISONE 20 MG PO TABS: 20.0000 mg | ORAL_TABLET | Freq: Two times a day (BID) | ORAL | 0 refills | Status: DC

## 2018-09-12 NOTE — Telephone Encounter (Signed)
Pt's wife called back and told preferred pharmacy, Rx sent. Pt had been taking allopurinol qd.

## 2018-09-12 NOTE — Telephone Encounter (Signed)
LM for pt's wife, Freda Munro to call back. Patient had 2 pharmacies listed and need to clarify which to send med.

## 2018-09-12 NOTE — Progress Notes (Signed)
pr

## 2018-09-12 NOTE — Progress Notes (Signed)
pes

## 2018-09-13 ENCOUNTER — Telehealth: Payer: Self-pay

## 2018-09-13 NOTE — Telephone Encounter (Signed)
Opened in error

## 2018-10-04 NOTE — Telephone Encounter (Signed)
Request was signed on 09/26/18 by PCP.

## 2018-11-05 ENCOUNTER — Ambulatory Visit: Payer: BC Managed Care – PPO | Admitting: Family Medicine

## 2018-11-08 ENCOUNTER — Other Ambulatory Visit: Payer: Self-pay

## 2018-11-09 ENCOUNTER — Telehealth: Payer: Self-pay

## 2018-11-09 ENCOUNTER — Encounter: Payer: Self-pay | Admitting: Family Medicine

## 2018-11-09 ENCOUNTER — Ambulatory Visit (INDEPENDENT_AMBULATORY_CARE_PROVIDER_SITE_OTHER): Payer: BC Managed Care – PPO | Admitting: Family Medicine

## 2018-11-09 VITALS — BP 127/85 | HR 86 | Temp 98.0°F | Resp 16 | Ht 68.0 in | Wt 167.8 lb

## 2018-11-09 DIAGNOSIS — Z23 Encounter for immunization: Secondary | ICD-10-CM

## 2018-11-09 DIAGNOSIS — E7800 Pure hypercholesterolemia, unspecified: Secondary | ICD-10-CM | POA: Diagnosis not present

## 2018-11-09 DIAGNOSIS — E119 Type 2 diabetes mellitus without complications: Secondary | ICD-10-CM | POA: Diagnosis not present

## 2018-11-09 DIAGNOSIS — Z8673 Personal history of transient ischemic attack (TIA), and cerebral infarction without residual deficits: Secondary | ICD-10-CM | POA: Diagnosis not present

## 2018-11-09 DIAGNOSIS — I482 Chronic atrial fibrillation, unspecified: Secondary | ICD-10-CM | POA: Diagnosis not present

## 2018-11-09 LAB — MICROALBUMIN / CREATININE URINE RATIO
Creatinine,U: 70.1 mg/dL
Microalb Creat Ratio: 1 mg/g (ref 0.0–30.0)
Microalb, Ur: <0.7 mg/dL (ref 0.0–1.9)

## 2018-11-09 NOTE — Addendum Note (Signed)
Addended by: Marlene Bast A on: 11/09/2018 11:35 AM   Modules accepted: Orders

## 2018-11-09 NOTE — Telephone Encounter (Signed)
Called patient and lvm to call me back. We need to schedule a nurse visit so he can get his labs. Not table to obtain labs today

## 2018-11-09 NOTE — Progress Notes (Signed)
OFFICE VISIT  11/09/2018   CC:  Chief Complaint  Patient presents with  . Follow-up    RCI, pt is not fasting   HPI:    Patient is a 58 y.o. Caucasian male who presents accompanied by his wife for DM 2, HTN, a-fib (on xarelto), HLD. Has hx of CVA with minimal residual deficit 05/2018. He continues to abstain from all alcohol and is doing well with this.  DM: doesn't check home glucoses.  No polydipsia or polydipsia. He does walk around his property some but is signif limited due to his chronic R hip pain/arthritis. No NSAIDs.  Feet->no burning, tingling, or numbness.  HTN: doesn't monitor bp at home.  HLD: tolerating statin.  A-fib: no palpitations or sense of heart racing.  No melena.    ROS: no CP, no SOB, no wheezing, no cough, no dizziness, no HAs, no rashes, no melena/hematochezia.  No polyuria or polydipsia. +chronic R hip pain.  Past Medical History:  Diagnosis Date  . Alcohol abuse    ongoing as of 07/2017.  Quit after CVA 05/2018  . Arthritis   . Atrial fibrillation ([place])    Not candidate for anticoagulation b/c of GI bleeding; had to get transfused.  Then, pt had CVA 05/2018 and he was put on xarelto and he quit drinking.  . CVA (cerebral vascular accident) ([place]) 05/2018   large R MCA territory infarct.  CTA neg for large cerebral vessel occlusion.  R ICA 70% stenosis, L clear.  . Gastric ulcer    hx of--unclear (pt and wife deny this)  . Gout    Usually MTP joint  . History of digital clubbing    "all my adult life"--CXR 08/2016 = bronchitic changes o/w normal.  . Hyperkalemia 04/2017   Normalized with decreasing lisinopril from 40 mg qd to 20 mg qd.  . Hyperlipidemia 07/2016   Holding off on statin until pt quits drinking.  Back on statin after CVA 05/2018  . Hypertension   . Nonischemic cardiomyopathy ([place]) 2015; 2020   2015: "resolved" (w/medical therapy?).  05/2018 EF 35-40%, with TR and MR and ++LAE.  Marland Kitchen Nonrheumatic mitral valve regurgitation   .  Osteoarthritis of right hip    Scheduled for surgery 05/11/15 but he then declined to get the surgery.  . Tobacco abuse    Quit 2014  . Type II diabetes mellitus ([place]) 07/2016   Dx'd by two fasting glucoses > 126 (Hb a1c 6.2% at that time).      Past Surgical History:  Procedure Laterality Date  . CARDIAC CATHETERIZATION  2015   Normal coronaries  . CARDIOVERSION     DCCV 2015  . TONSILLECTOMY    . TRANSTHORACIC ECHOCARDIOGRAM  06/08/2018   (in context of acute CVA): EF 35-40%, mild/mod MR, mod/sev TR, severely dilated LA.    Outpatient Medications Prior to Visit  Medication Sig Dispense Refill  . allopurinol (ZYLOPRIM) 100 MG tablet 2 tabs po qd (Patient taking differently: Take 200 mg by mouth daily. ) 60 tablet 2  . digoxin (LANOXIN) 0.125 MG tablet Take 1 tablet (0.125 mg total) by mouth daily. 30 tablet 6  . folic acid (FOLVITE) 1 MG tablet 1 tab po qd 90 tablet 3  . lisinopril (ZESTRIL) 40 MG tablet 1/2 tab po qd 30 tablet 6  . pantoprazole (PROTONIX) 40 MG tablet Take 1 tablet (40 mg total) by mouth 2 (two) times daily. 60 tablet 6  . XARELTO 20 MG TABS tablet Take 1 tablet (  20 mg total) by mouth daily with supper. 30 tablet 6  . atorvastatin (LIPITOR) 40 MG tablet Place 1 tablet (40 mg total) into feeding tube daily at 6 PM. 30 tablet 6  . glucose blood (CONTOUR NEXT TEST) test strip Use to check blood sugar once daily. (Patient not taking: Reported on 11/09/2018) 100 each 12  . MICROLET LANCETS MISC Use to check blood sugar once daily (Patient not taking: Reported on 11/09/2018) 100 each 11  . ondansetron (ZOFRAN) 4 MG tablet Take 1 tablet (4 mg total) by mouth every 8 (eight) hours as needed for nausea or vomiting. (Patient not taking: Reported on 11/09/2018) 20 tablet 0  . predniSONE (DELTASONE) 20 MG tablet Take 1 tablet (20 mg total) by mouth 2 (two) times daily. (Patient not taking: Reported on 11/09/2018) 10 tablet 0   No facility-administered medications prior to visit.      No Known Allergies  ROS As per HPI  PE: Blood pressure 127/85, pulse 86, temperature 98 F (36.7 C), temperature source Temporal, resp. rate 16, height 5\' 8"  (1.727 m), weight 167 lb 12.8 oz (76.1 kg), SpO2 100 %. Body mass index is 25.51 kg/m.  Gen: Alert, well appearing.  Patient is oriented to person, place, time, and situation. AFFECT: pleasant, lucid thought and speech. CV: irreg irreg, rate 80 or so, no murmur or rub. Chest is clear, no wheezing or rales. Normal symmetric air entry throughout both lung fields. No chest wall deformities or tenderness. EXT: no clubbing or cyanosis.  no edema.  Foot exam -  no swelling, tenderness or skin or vascular lesions. Color and temperature is normal. Sensation is intact. Peripheral pulses are palpable. Toenails are mildly hypertrophic.  LABS:  Lab Results  Component Value Date   TSH 1.21 07/31/2017   Lab Results  Component Value Date   WBC 7.0 07/24/2018   HGB 12.9 (L) 07/24/2018   HCT 39.1 07/24/2018   MCV 91.4 07/24/2018   PLT 286.0 07/24/2018   Lab Results  Component Value Date   CREATININE 0.70 07/24/2018   BUN 13 07/24/2018   NA 140 07/24/2018   K 4.6 07/24/2018   CL 100 07/24/2018   CO2 32 07/24/2018   Lab Results  Component Value Date   ALT 22 07/24/2018   AST 20 07/24/2018   ALKPHOS 86 07/24/2018   BILITOT 0.8 07/24/2018   Lab Results  Component Value Date   CHOL 198 06/07/2018   Lab Results  Component Value Date   HDL 81 06/07/2018   Lab Results  Component Value Date   LDLCALC 103 (H) 06/07/2018   Lab Results  Component Value Date   TRIG 72 06/07/2018   Lab Results  Component Value Date   CHOLHDL 2.4 06/07/2018   Lab Results  Component Value Date   PSA 1.90 07/31/2017   PSA 2.08 08/01/2016   Lab Results  Component Value Date   HGBA1C 5.5 06/07/2018    IMPRESSION AND PLAN:  1) DM, diet controlled.  Sounds like he needs to work a bit more on limiting simple sugars. He is not able  to exercise due to chronic R hip pain/osteoarthritis. Feet exam normal today. HbA1c and urine microalbumin/cr today.  Lytes/cr as well.  2) HTN: The current medical regimen is effective;  continue present plan and medications. Lytes/cr today.  3) HLD: tolerating statin. FLP and hepatic panel today ( pt is NOT fasting).  4) chronic a-fib->asymptomatic, rate normal on digoxin.  No problems with xarelto.  5) Alcoholism: doing well since quitting 5 mo ago.  An After Visit Summary was printed and given to the patient.  FOLLOW UP: Return in about 6 months (around 05/10/2019) for annual CPE (fasting).  Signed:  Crissie Sickles, MD           11/09/2018

## 2018-11-12 NOTE — Telephone Encounter (Signed)
Called patients wife and lvm to get lab visit scheduled

## 2018-11-13 ENCOUNTER — Ambulatory Visit: Payer: BC Managed Care – PPO

## 2018-11-15 ENCOUNTER — Other Ambulatory Visit: Payer: Self-pay

## 2018-11-15 ENCOUNTER — Ambulatory Visit (INDEPENDENT_AMBULATORY_CARE_PROVIDER_SITE_OTHER): Payer: BC Managed Care – PPO | Admitting: Family Medicine

## 2018-11-15 DIAGNOSIS — E7800 Pure hypercholesterolemia, unspecified: Secondary | ICD-10-CM

## 2018-11-15 DIAGNOSIS — E119 Type 2 diabetes mellitus without complications: Secondary | ICD-10-CM

## 2018-11-15 DIAGNOSIS — I482 Chronic atrial fibrillation, unspecified: Secondary | ICD-10-CM | POA: Diagnosis not present

## 2018-11-15 LAB — COMPREHENSIVE METABOLIC PANEL
ALT: 14 U/L (ref 0–53)
AST: 16 U/L (ref 0–37)
Albumin: 4.7 g/dL (ref 3.5–5.2)
Alkaline Phosphatase: 90 U/L (ref 39–117)
BUN: 12 mg/dL (ref 6–23)
CO2: 33 mEq/L — ABNORMAL HIGH (ref 19–32)
Calcium: 9.7 mg/dL (ref 8.4–10.5)
Chloride: 101 mEq/L (ref 96–112)
Creatinine, Ser: 0.82 mg/dL (ref 0.40–1.50)
GFR: 96.2 mL/min (ref >60.00)
Glucose, Bld: 107 mg/dL — ABNORMAL HIGH (ref 70–99)
Potassium: 3.9 mEq/L (ref 3.5–5.1)
Sodium: 142 mEq/L (ref 135–145)
Total Bilirubin: 0.8 mg/dL (ref 0.2–1.2)
Total Protein: 7 g/dL (ref 6.0–8.3)

## 2018-11-15 LAB — LIPID PANEL
Cholesterol: 144 mg/dL (ref 0–200)
HDL: 58.9 mg/dL (ref >39.00)
LDL Cholesterol: 74 mg/dL (ref 0–99)
NonHDL: 84.62
Total CHOL/HDL Ratio: 2
Triglycerides: 55 mg/dL (ref 0.0–149.0)
VLDL: 11 mg/dL (ref 0.0–40.0)

## 2018-11-15 LAB — HEMOGLOBIN A1C: Hgb A1c MFr Bld: 5.7 % (ref 4.6–6.5)

## 2018-11-22 ENCOUNTER — Ambulatory Visit: Payer: Self-pay | Admitting: Adult Health

## 2018-11-25 ENCOUNTER — Other Ambulatory Visit: Payer: Self-pay | Admitting: Family Medicine

## 2018-12-12 ENCOUNTER — Encounter: Payer: Self-pay | Admitting: Family Medicine

## 2019-01-08 ENCOUNTER — Ambulatory Visit: Payer: Self-pay | Admitting: Adult Health

## 2019-01-11 DIAGNOSIS — Z86010 Personal history of colonic polyps: Secondary | ICD-10-CM

## 2019-01-11 DIAGNOSIS — Z860101 Personal history of adenomatous and serrated colon polyps: Secondary | ICD-10-CM

## 2019-01-11 HISTORY — DX: Personal history of colonic polyps: Z86.010

## 2019-01-11 HISTORY — DX: Personal history of adenomatous and serrated colon polyps: Z86.0101

## 2019-01-28 ENCOUNTER — Encounter: Payer: Self-pay | Admitting: Adult Health

## 2019-01-28 ENCOUNTER — Ambulatory Visit (INDEPENDENT_AMBULATORY_CARE_PROVIDER_SITE_OTHER): Payer: 59 | Admitting: Adult Health

## 2019-01-28 ENCOUNTER — Other Ambulatory Visit: Payer: Self-pay

## 2019-01-28 VITALS — BP 126/83 | HR 100 | Temp 97.4°F | Ht 68.0 in | Wt 166.2 lb

## 2019-01-28 DIAGNOSIS — I6521 Occlusion and stenosis of right carotid artery: Secondary | ICD-10-CM

## 2019-01-28 DIAGNOSIS — I10 Essential (primary) hypertension: Secondary | ICD-10-CM | POA: Diagnosis not present

## 2019-01-28 DIAGNOSIS — E785 Hyperlipidemia, unspecified: Secondary | ICD-10-CM

## 2019-01-28 DIAGNOSIS — I480 Paroxysmal atrial fibrillation: Secondary | ICD-10-CM

## 2019-01-28 DIAGNOSIS — I63411 Cerebral infarction due to embolism of right middle cerebral artery: Secondary | ICD-10-CM | POA: Diagnosis not present

## 2019-01-28 NOTE — Progress Notes (Signed)
[facility] [address] [place]. [place] [address] [phone]       STROKE FOLLOW UP NOTE  [name] Date of Birth:  1960/09/03 Medical Record Number:  [id number]   Reason for Referral: stroke follow up    CHIEF COMPLAINT:  Chief Complaint  Patient presents with  . Follow-up    3 mon f/u. Wife present. Treatment room. No new concerns at this time.     HPI: Stroke admission 06/06/2018: [name] is a 59 y.o. male with history of Etoh Abuse, Afib not on [place] d/t GIB, DM, peptic ulcer, hypertension, hyperlipidemia, gout who presented with AMS and dysarthria. Found to have large subacute RMCA infarct secondary to A. fib not on AC versus right ICA high-grade stenosis with resultant severe dysarthria.  MRI unable to be performed due to metal insult.  Vessel imaging showed 70% proximal right ICA stenosis and occlusion of right vertebral artery at its origin.  2D echo showed an EF of 35 to 40%.  LDL 103 and A1c 5.5.  It was recommended to initiate Eliquis due to atrial fibrillation and recent stroke.  Initiated atorvastatin 40 mg daily for HLD management.  Current tobacco use of smoking cessation counseling provided.  Repeat CT head on 06/07/2018 showed acute hypoxic decompensation and respiratory distress ultimately needing intubation secondary to aspiration pneumonia versus pneumonitis with successful extubation 06/09/2018.  Initially failed swallow eval with need of NG tube but was able to be discharged on modified diet.  Due to residual deficits, he was discharged to SNF for ongoing therapies.  Initial visit 08/15/2018: He has since been discharged home with residual dysarthria, word finding difficulty and dysphasia.  Home OT evaluated but discontinued as it was felt as though he would benefit more from PT/ST which will start this Friday.  Currently ambulating with rolling walker due to dizziness/balance difficulties but denies any falls.  He has been experiencing  increased coughing while eating.  He is eating regular food at home but wife does ensure pieces are caught up small.  He has remained on nectar thick liquid which is typically when he will start coughing.  Wife states he uses a coffee stirring straw to ensure he does not drink too much at once.  He has been having blood pressure medications adjusted by PCP due to potential orthostatic dizziness and hypotension likely secondary to weight loss since stroke and decreased cardiac output.  He does endorse improvement of blood pressures with medication changes and decreased dizziness episodes.  He has completely quit drinking alcohol since discharge.  Currently on Xarelto without bleeding or bruising (discharged on Eliquis after hospitalization - ?  Change in medication during SNF).  Continues on atorvastatin without myalgias.  Denies new or worsening stroke/TIA symptoms.  Update 01/28/2019: [name] is a 59 year old male being seen today for stroke follow up accompanied by his wife.  Residual stroke deficits of subjective dysphagia and occasional word finding difficulty but does endorse ongoing improvement.  He has also been having increased difficulty with right hip pain which has been limiting functional activity.  He was evaluated by home therapies with improvement of ambulation and no longer requires assistive device.  Per wife, swallowing satisfactory and no evidence of dysphagia.  He reports occasional difficulty with coughing on his saliva but no difficulty with thin liquids or foods.  He continues on Xarelto and atorvastatin for secondary stroke prevention with side effects.  Blood pressure today satisfactory 126/83.  Denies new or  worsening stroke/TIA symptoms.    ROS:   14 system review of systems performed and negative with exception of occasional speech and swallowing difficulty and pain  PMH:  Past Medical History:  Diagnosis Date  . Alcohol abuse    ongoing as of 07/2017.  Quit after CVA  05/2018  . Arthritis   . Atrial fibrillation ([place])    Not candidate for anticoagulation b/c of GI bleeding; had to get transfused.  Then, pt had CVA 05/2018 and he was put on xarelto and he quit drinking.  . CVA (cerebral vascular accident) ([place]) 05/2018   large R MCA territory infarct.  CTA neg for large cerebral vessel occlusion.  R ICA 70% stenosis, L clear.  . Gastric ulcer    hx of--unclear (pt and wife deny this)  . Gout    Usually MTP joint  . History of digital clubbing    "all my adult life"--CXR 08/2016 = bronchitic changes o/w normal.  . Hyperkalemia 04/2017   Normalized with decreasing lisinopril from 40 mg qd to 20 mg qd.  . Hyperlipidemia 07/2016   Holding off on statin until pt quits drinking.  Back on statin after CVA 05/2018  . Hypertension   . Nonischemic cardiomyopathy ([place]) 2015; 2020   2015: "resolved" (w/medical therapy?).  05/2018 EF 35-40%, with TR and MR and ++LAE.  Marland Kitchen Nonrheumatic mitral valve regurgitation   . Osteoarthritis of right hip    Scheduled for surgery 05/11/15 but he then declined to get the surgery.  . Tobacco abuse    Quit 2014  . Type II diabetes mellitus ([place]) 07/2016   Dx'd by two fasting glucoses > 126 (Hb a1c 6.2% at that time).      PSH:  Past Surgical History:  Procedure Laterality Date  . CARDIAC CATHETERIZATION  2015   Normal coronaries  . CARDIOVERSION     DCCV 2015  . TONSILLECTOMY    . TRANSTHORACIC ECHOCARDIOGRAM  06/08/2018   (in context of acute CVA): EF 35-40%, mild/mod MR, mod/sev TR, severely dilated LA.    Social History:  Social History   Socioeconomic History  . Marital status: Married    Spouse name: Not on file  . Number of children: Not on file  . Years of education: Not on file  . Highest education level: Not on file  Occupational History  . Not on file  Tobacco Use  . Smoking status: Former Smoker    Packs/day: 1.00    Years: 5.00    Pack years: 5.00    Types: Cigarettes    Quit date: 01/10/2013    Years  since quitting: 6.0  . Smokeless tobacco: Never Used  Substance and Sexual Activity  . Alcohol use: Yes    Comment: drinks liquor daily  . Drug use: No  . Sexual activity: Not on file  Other Topics Concern  . Not on file  Social History Narrative   Married, 2 adult children.   Educ: 11th grade   Occup: "out of work".  Worked for [employer]--was let go for not abiding by [employer] per pt/wife.   Tob: former--quit 2014.  "Of and on prior".   Alc: currently drinks a pint and 1 and 1/2 pint of whiskey per day x years---long hx of alcohol abuse.   Drugs: none.   Social Determinants of Health   Financial Resource Strain:   . Difficulty of Paying Living Expenses: Not on file  Food Insecurity:   . Worried About Estate manager/land agent  of Food in the Last Year: Not on file  . Ran Out of Food in the Last Year: Not on file  Transportation Needs:   . Lack of Transportation (Medical): Not on file  . Lack of Transportation (Non-Medical): Not on file  Physical Activity:   . Days of Exercise per Week: Not on file  . Minutes of Exercise per Session: Not on file  Stress:   . Feeling of Stress : Not on file  Social Connections:   . Frequency of Communication with Friends and Family: Not on file  . Frequency of Social Gatherings with Friends and Family: Not on file  . Attends Religious Services: Not on file  . Active Member of Clubs or Organizations: Not on file  . Attends Archivist Meetings: Not on file  . Marital Status: Not on file  Intimate Partner Violence:   . Fear of Current or Ex-Partner: Not on file  . Emotionally Abused: Not on file  . Physically Abused: Not on file  . Sexually Abused: Not on file    Family History:  Family History  Problem Relation Age of Onset  . Diabetes Mother   . Congestive Heart Failure Mother   . Alcohol abuse Father   . Hypertension Father   . Stroke Father   . Heart disease Paternal Grandfather     Medications:   Current Outpatient  Medications on File Prior to Visit  Medication Sig Dispense Refill  . allopurinol (ZYLOPRIM) 100 MG tablet TAKE TWO TABLETS BY MOUTH DAILY 60 tablet 1  . atorvastatin (LIPITOR) 40 MG tablet Take 40 mg by mouth daily.    . digoxin (LANOXIN) 0.125 MG tablet Take 1 tablet (0.125 mg total) by mouth daily. 30 tablet 6  . folic acid (FOLVITE) 1 MG tablet 1 tab po qd 90 tablet 3  . lisinopril (ZESTRIL) 40 MG tablet 1/2 tab po qd 30 tablet 6  . pantoprazole (PROTONIX) 40 MG tablet Take 1 tablet (40 mg total) by mouth 2 (two) times daily. 60 tablet 6  . XARELTO 20 MG TABS tablet Take 1 tablet (20 mg total) by mouth daily with supper. 30 tablet 6  . glucose blood (CONTOUR NEXT TEST) test strip Use to check blood sugar once daily. (Patient not taking: Reported on 11/09/2018) 100 each 12  . MICROLET LANCETS MISC Use to check blood sugar once daily (Patient not taking: Reported on 11/09/2018) 100 each 11  . ondansetron (ZOFRAN) 4 MG tablet Take 1 tablet (4 mg total) by mouth every 8 (eight) hours as needed for nausea or vomiting. (Patient not taking: Reported on 11/09/2018) 20 tablet 0   No current facility-administered medications on file prior to visit.    Allergies:  No Known Allergies   Physical Exam  Vitals:   01/28/19 1011  BP: 126/83  Pulse: 100  Temp: (!) 97.4 F (36.3 C)  TempSrc: Oral  Weight: 166 lb 3.2 oz (75.4 kg)  Height: 5\' 8"  (1.727 m)   Body mass index is 25.27 kg/m. No exam data present  Depression screen [facility] 2/9 08/15/2018  Decreased Interest 0  Down, Depressed, Hopeless 0  PHQ - 2 Score 0     General: Frail middle-aged but elderly-appearing Caucasian male, seated, in no evident distress Head: head normocephalic and atraumatic.   Neck: supple with no carotid or supraclavicular bruits Cardiovascular: irregular rate and rhythm, no murmurs Musculoskeletal: no deformity; limited right hip ROM due to pain Skin:  no rash/petichiae Vascular:  Normal pulses  all  extremities   Neurologic Exam Mental Status: Awake and fully alert.   Mild dysarthria with occasional word finding difficulty.  Oriented to place and time. Recent and remote memory intact. Attention span, concentration and fund of knowledge slightly diminished with wife helping to provide some information. Mood and affect appropriate.  Cranial Nerves: Pupils equal, briskly reactive to light. Extraocular movements full without nystagmus. Visual fields full to confrontation. Hearing intact. Facial sensation intact.  Mild left lower facial paralysis, tongue, palate moves normally and symmetrically.  Motor: Normal bulk and tone.  Equal and normal strength tested on all extremities Sensory.: intact to touch , pinprick , position and vibratory sensation.  Coordination: Rapid alternating movements normal in all extremities. Finger-to-nose performed accurately bilaterally and heel-to-shin performed accurately on left side but unable to perform on right side due to hip pain. Gait and Station: Arises from chair with difficulty. Stance is normal. Gait demonstrates normal stride length with hemiplegic gait with favoring of right leg due to hip pain without assistive device Reflexes: 1+ and symmetric. Toes downgoing.     Diagnostic Data (Labs, Imaging, Testing)  Ct Angio Head W Or Wo Contrast Ct Angio Neck W Or Wo Contrast 06/08/2018 IMPRESSION:   CT HEAD  1. Continued interval evolution of large right MCA territory infarct, stable in size and distribution from previous. Associated mild regional mass effect with trace 2 mm right-to-left shift. Probable associated petechial hemorrhage without evidence for hemorrhagic transformation.  2. No other new acute intracranial abnormality.  3. Underlying atrophy with moderate chronic small vessel ischemic disease, with additional multiple chronic infarcts as above.   CTA HEAD AND NECK  1. Negative CTA for acute large vessel occlusion.  2. 70% atheromatous  stenosis at the proximal right ICA. Left carotid artery system widely patent within the neck.  3. Occlusion of the right vertebral artery at its origin. Dominant left vertebral artery widely patent within the neck. Moderate proximal left V4 stenosis.  4. Moderate to large layering bilateral pleural effusions with associated atelectasis and/or consolidation.    Ct Angio Chest Pe W Or Wo Contrast 06/07/2018 IMPRESSION:  1. No PE identified on today's exam. The main pulmonary artery is dilated which can be seen in patients with elevated PA pressures.  2. Extensive bilateral multifocal consolidation as detailed above concerning for multifocal pneumonia or aspiration. Some debris is noted within the trachea.  3. The endotracheal tube terminates somewhat high at the level of the thoracic inlet. Repositioning should be considered.  4. Small to moderate-sized bilateral pleural effusions.  5. Enteric tube terminates in the stomach.  6. Cardiomegaly. There is reflux of contrast in the IVC consistent with some degree of underlying cardiac dysfunction. Aortic Atherosclerosis (ICD10-I70.0).   Dg Chest Port 1 View 06/09/2018 IMPRESSION:  1. Well-positioned support structures.  2. Decreased right perihilar opacity, suggesting improving pulmonary edema or improving atelectasis.  3. Stable small right pleural effusion.  4. Similar patchy left retrocardiac opacity.   Dg Abd Portable 1v 06/07/2018 IMPRESSION:  Orogastric tube with the tip projecting over the stomach.    Transthoracic Echocardiogram  Reduced ejection fraction of 35 to 40%.  No wall motion abnormalities.  EKG - Afib RVR  EEG  IMPRESSION:  This recording shows moderate global slowing indicating a moderate global encephalopathy. However, no epileptiform discharges are observed.     ASSESSMENT: Robert [name] is a 59 y.o. year old male presented with altered mental status and dysarthria with findings of right large MCA infarct  on 06/06/2018 secondary to likely atrial fibrillation not on AC. Vascular risk factors include right ICA high-grade stenosis, EtOH use, A. fib not on AC, DM, HTN, HLD and tobacco use.  Residual deficits of subjective occasional dysphasia, and mild dysarthria and occasional word finding difficulty.     PLAN:  1. Right MCA infarct: Continue Xarelto (rivaroxaban) daily  and atorvastatin for secondary stroke prevention. Maintain strict control of hypertension with blood pressure goal below 130/90, diabetes with hemoglobin A1c goal below 6.5% and cholesterol with LDL cholesterol (bad cholesterol) goal below 70 mg/dL.  I also advised the patient to eat a healthy diet with plenty of whole grains, cereals, fruits and vegetables, exercise regularly with at least 30 minutes of continuous activity daily and maintain ideal body weight.  Highly encouraged continuation of HEP as recommended during therapy sessions 2. Right ICA stenosis: Surveillance monitoring with CTA head/neck 3. Atrial fibrillation: Continue Xarelto and ongoing follow-up by cardiology for management and monitoring 4. HTN: Advised to continue current treatment regimen.  Today's BP stable.  Advised to continue to monitor at home along with continued follow-up with PCP for management 5. HLD: Advised to continue current treatment regimen along with continued follow-up with PCP for future prescribing and monitoring of lipid panel 6. DMII: Advised to continue to monitor glucose levels at home along with continued follow-up with PCP for management and monitoring 7. Right hip pain: Advised to reach out to establish orthopedic provider for evaluation    Follow up in 6 months or call earlier if needed   Greater than 50% of time during this 30 minute visit was spent on counseling, explanation of diagnosis of right MCA infarct, reviewing risk factor management of atrial fibrillation, right ICA stenosis, HTN, HLD, DM and prior EtOH/tobacco use, discussion  regarding residual deficits, planning of further management along with potential future management, and discussion with patient and family answering all questions.    Frann Rider, AGNP-BC  [facility] [address] [place], [place] [address]  Phone [phone] Fax [phone] Note: This document was prepared with digital dictation and possible smart phrase technology. Any transcriptional errors that result from this process are unintentional.

## 2019-01-28 NOTE — Patient Instructions (Addendum)
Continue Xarelto (rivaroxaban) daily  and Lipitor for secondary stroke prevention  Continue to follow up with PCP regarding cholesterol, blood pressure and diabetes management   Continue to follow with cardiology for atrial fibrillation and Xarelto management  Schedule follow-up visit with Dr. Laurance Flatten in regards to ongoing hip pain and possible future interventions  Continue to monitor blood pressure at home  You will be called to schedule follow-up imaging for monitoring of known carotid stenosis  Maintain strict control of hypertension with blood pressure goal below 130/90, diabetes with hemoglobin A1c goal below 6.5% and cholesterol with LDL cholesterol (bad cholesterol) goal below 70 mg/dL. I also advised the patient to eat a healthy diet with plenty of whole grains, cereals, fruits and vegetables, exercise regularly and maintain ideal body weight.  Followup in the future with me in 6 months or call earlier if needed       Thank you for coming to see [place] at [facility]. I hope we have been able to provide you high quality care today.  You may receive a patient satisfaction survey over the next few weeks. We would appreciate your feedback and comments so that we may continue to improve ourselves and the health of our patients.

## 2019-01-29 NOTE — Progress Notes (Signed)
I agree with the above plan 

## 2019-02-01 ENCOUNTER — Other Ambulatory Visit: Payer: Self-pay | Admitting: Family Medicine

## 2019-02-04 ENCOUNTER — Encounter: Payer: Self-pay | Admitting: Family Medicine

## 2019-02-12 ENCOUNTER — Encounter: Payer: Self-pay | Admitting: Family Medicine

## 2019-02-12 ENCOUNTER — Telehealth: Payer: Self-pay

## 2019-02-12 NOTE — Telephone Encounter (Signed)
Patient wife, Robert [name], was advised by pharmacy that Zarelto prescription was over $100.  Is there anything else patient can switch to that is less expensive or maybe there is a copay discount card for this medicine.  Please advise. Robert [name] is aware that Dr. Anitra Lauth has left for the day, She is okay with waiting until tomorrow 02/13/19 for a return call.  Please call cell first [phone], if she does not answer, please try her work number [phone].

## 2019-02-12 NOTE — Telephone Encounter (Signed)
Pt's last visit was 11/09/18 for [facility] f/u and next visit on 05/08/19. He takes xarelto for a-fib since 07/24/18 but is now too expensive.   Please advise if something else can be sent? I'm not sure if pt would qualify for use of 30 day free trail voucher.

## 2019-02-12 NOTE — Telephone Encounter (Signed)
Advise pt/wife to call insurer and ask if eliquis is a preferred med on their formulary. If so, we'll rx this and see if the cost is better. If not, then we'll use coumadin but would prefer to avoid this med if we can.

## 2019-02-13 NOTE — Telephone Encounter (Signed)
LM on pt's cell and work number regarding med options.

## 2019-02-13 NOTE — Telephone Encounter (Signed)
Patient's wife advised and will call with update regarding Rx and cost.

## 2019-02-14 MED ORDER — XARELTO 20 MG PO TABS: 20.0000 mg | ORAL_TABLET | Freq: Every day | ORAL | 3 refills | Status: DC

## 2019-02-14 NOTE — Telephone Encounter (Signed)
OK, xarelto erx'd

## 2019-02-14 NOTE — Telephone Encounter (Signed)
Pt's wife, Freda Munro called with update and stated if Xarelto sent for 90 d/s it would be $100. Please send Xarelto 20mg  for 90 d/s to [facility].

## 2019-02-15 ENCOUNTER — Ambulatory Visit
Admission: RE | Admit: 2019-02-15 | Discharge: 2019-02-15 | Disposition: A | Discharge status: Home / Self Care | Payer: BC Managed Care – PPO | Source: Ambulatory Visit | Attending: Adult Health | Admitting: Adult Health

## 2019-02-15 ENCOUNTER — Other Ambulatory Visit: Payer: Self-pay

## 2019-02-15 ENCOUNTER — Ambulatory Visit
Admission: RE | Admit: 2019-02-15 | Discharge: 2019-02-15 | Disposition: A | Discharge status: Home / Self Care | Payer: 59 | Source: Ambulatory Visit | Attending: Adult Health | Admitting: Adult Health

## 2019-02-15 MED ORDER — IOPAMIDOL (ISOVUE-370) INJECTION 76%
75.0000 mL | Freq: Once | INTRAVENOUS | Status: AC | PRN
  Administered 2019-02-15: 75 mL via INTRAVENOUS

## 2019-02-20 ENCOUNTER — Telehealth: Payer: Self-pay | Admitting: *Deleted

## 2019-02-20 NOTE — Telephone Encounter (Signed)
Called patient, wife answered , Robert [name] (on [place]). Informed her that recent imaging (CT angio head) showed improvement right carotid artery stenosis from 70% to 50%. PNP advised to please continue current regimen at this time. She  verbalized understanding, appreciation.

## 2019-03-09 ENCOUNTER — Other Ambulatory Visit: Payer: Self-pay | Admitting: Family Medicine

## 2019-03-11 NOTE — Telephone Encounter (Signed)
RF request for atorvastatin 40mg .  Last prescriber is Unknown.  Okay to fill?

## 2019-03-29 ENCOUNTER — Other Ambulatory Visit: Payer: Self-pay | Admitting: Family Medicine

## 2019-05-08 ENCOUNTER — Other Ambulatory Visit: Payer: Self-pay

## 2019-05-09 ENCOUNTER — Other Ambulatory Visit: Payer: Self-pay

## 2019-05-09 ENCOUNTER — Encounter: Payer: Self-pay | Admitting: Family Medicine

## 2019-05-09 ENCOUNTER — Ambulatory Visit (INDEPENDENT_AMBULATORY_CARE_PROVIDER_SITE_OTHER): Payer: 59 | Admitting: Family Medicine

## 2019-05-09 VITALS — BP 114/76 | HR 90 | Temp 98.3°F | Resp 16 | Ht 65.5 in | Wt 169.2 lb

## 2019-05-09 DIAGNOSIS — Z1211 Encounter for screening for malignant neoplasm of colon: Secondary | ICD-10-CM

## 2019-05-09 DIAGNOSIS — Z125 Encounter for screening for malignant neoplasm of prostate: Secondary | ICD-10-CM | POA: Diagnosis not present

## 2019-05-09 DIAGNOSIS — Z0000 Encounter for general adult medical examination without abnormal findings: Secondary | ICD-10-CM

## 2019-05-09 DIAGNOSIS — Z8673 Personal history of transient ischemic attack (TIA), and cerebral infarction without residual deficits: Secondary | ICD-10-CM

## 2019-05-09 DIAGNOSIS — E7800 Pure hypercholesterolemia, unspecified: Secondary | ICD-10-CM

## 2019-05-09 DIAGNOSIS — I482 Chronic atrial fibrillation, unspecified: Secondary | ICD-10-CM | POA: Diagnosis not present

## 2019-05-09 DIAGNOSIS — E119 Type 2 diabetes mellitus without complications: Secondary | ICD-10-CM | POA: Diagnosis not present

## 2019-05-09 DIAGNOSIS — Z9189 Other specified personal risk factors, not elsewhere classified: Secondary | ICD-10-CM

## 2019-05-09 DIAGNOSIS — Z1159 Encounter for screening for other viral diseases: Secondary | ICD-10-CM

## 2019-05-09 DIAGNOSIS — I10 Essential (primary) hypertension: Secondary | ICD-10-CM | POA: Diagnosis not present

## 2019-05-09 LAB — CBC WITH DIFFERENTIAL/PLATELET
Basophils Absolute: 0.1 10*3/uL (ref 0.0–0.1)
Basophils Relative: 1.1 % (ref 0.0–3.0)
Eosinophils Absolute: 0.1 10*3/uL (ref 0.0–0.7)
Eosinophils Relative: 1.4 % (ref 0.0–5.0)
HCT: 40.3 % (ref 39.0–52.0)
Hemoglobin: 13.4 g/dL (ref 13.0–17.0)
Lymphocytes Relative: 32.7 % (ref 12.0–46.0)
Lymphs Abs: 1.6 10*3/uL (ref 0.7–4.0)
MCHC: 33.3 g/dL (ref 30.0–36.0)
MCV: 91.3 fl (ref 78.0–100.0)
Monocytes Absolute: 0.6 10*3/uL (ref 0.1–1.0)
Monocytes Relative: 11.5 % (ref 3.0–12.0)
Neutro Abs: 2.6 10*3/uL (ref 1.4–7.7)
Neutrophils Relative %: 53.3 % (ref 43.0–77.0)
Platelets: 235 10*3/uL (ref 150.0–400.0)
RBC: 4.41 Mil/uL (ref 4.22–5.81)
RDW: 13.5 % (ref 11.5–15.5)
WBC: 4.9 10*3/uL (ref 4.0–10.5)

## 2019-05-09 LAB — COMPREHENSIVE METABOLIC PANEL
ALT: 19 U/L (ref 0–53)
AST: 18 U/L (ref 0–37)
Albumin: 4.6 g/dL (ref 3.5–5.2)
Alkaline Phosphatase: 95 U/L (ref 39–117)
BUN: 15 mg/dL (ref 6–23)
CO2: 34 mEq/L — ABNORMAL HIGH (ref 19–32)
Calcium: 9.8 mg/dL (ref 8.4–10.5)
Chloride: 101 mEq/L (ref 96–112)
Creatinine, Ser: 0.89 mg/dL (ref 0.40–1.50)
GFR: 87.38 mL/min (ref >60.00)
Glucose, Bld: 105 mg/dL — ABNORMAL HIGH (ref 70–99)
Potassium: 4.7 mEq/L (ref 3.5–5.1)
Sodium: 141 mEq/L (ref 135–145)
Total Bilirubin: 1 mg/dL (ref 0.2–1.2)
Total Protein: 7 g/dL (ref 6.0–8.3)

## 2019-05-09 LAB — LIPID PANEL
Cholesterol: 133 mg/dL (ref 0–200)
HDL: 56.2 mg/dL (ref >39.00)
LDL Cholesterol: 68 mg/dL (ref 0–99)
NonHDL: 76.66
Total CHOL/HDL Ratio: 2
Triglycerides: 43 mg/dL (ref 0.0–149.0)
VLDL: 8.6 mg/dL (ref 0.0–40.0)

## 2019-05-09 LAB — TSH: TSH: 1.38 u[IU]/mL (ref 0.35–4.50)

## 2019-05-09 LAB — HEMOGLOBIN A1C: Hgb A1c MFr Bld: 5.9 % (ref 4.6–6.5)

## 2019-05-09 LAB — PSA: PSA: 1.7 ng/mL (ref 0.10–4.00)

## 2019-05-09 NOTE — Progress Notes (Signed)
Office Note 05/09/2019  CC:  Chief Complaint  Patient presents with  . Annual Exam    pt is fasting    HPI:  Robert [name] is a 59 y.o. White male with hx of CVA, chronic a-fib, DM 2, HTN, HLD, and history of alcohol abuse who is here for annual health maintenance exam.   Chronic constipation: infrequent BMs, large brown formed bm when he does go. No meds.    HTN: no home bp monitoring.  BP at cardiologist's 03/21/19 was 124/76. Has echo planned for next month.  Cardiologist has him on metoprolol.  He also takes 1/2 40mg  per day.  HLD: tolerating statin.  Afib: feels only occasional brief palp/racing.  No CP, SOB, or presyncope.  Has chronic fatigue.  He does snoring.  No known apneic events. Not drinking any alcohol.  ROS: no fevers, no CP, no SOB, no wheezing, no cough, no dizziness, no HAs, no rashes, no melena/hematochezia.  No polyuria or polydipsia.  No myalgias or arthralgias.  No focal weakness, paresthesias, or tremors.  No acute vision or hearing abnormalities. No n/v/d or abd pain.  No palpitations.     Past Medical History:  Diagnosis Date  . Alcohol abuse    ongoing as of 07/2017.  Quit after CVA 05/2018  . Arthritis   . Atrial fibrillation ([place])    Not candidate for anticoagulation b/c of GI bleeding; had to get transfused.  Then, pt had CVA 05/2018 and he was put on xarelto and he quit drinking.  . CVA (cerebral vascular accident) ([place]) 05/2018   large R MCA territory infarct.  CTA neg for large cerebral vessel occlusion.  R ICA 70% stenosis, L clear. Rpt CTA neck ordered by neuro as of 01/2019.  Marland Kitchen Gastric ulcer    hx of--unclear (pt and wife deny this)  . Gout    Usually MTP joint  . History of digital clubbing    "all my adult life"--CXR 08/2016 = bronchitic changes o/w normal.  . Hyperkalemia 04/2017   Normalized with decreasing lisinopril from 40 mg qd to 20 mg qd.  . Hyperlipidemia 07/2016   Holding off on statin until pt quits drinking.  Back on  statin after CVA 05/2018  . Hypertension   . Nonischemic cardiomyopathy ([place]) 2015; 2020   2015: "resolved" (w/medical therapy?).  05/2018 EF 35-40%, with TR and MR and ++LAE.  Marland Kitchen Nonrheumatic mitral valve regurgitation   . Osteoarthritis of right hip    Scheduled for surgery 05/11/15 but he then declined to get the surgery.  . Tobacco abuse    Quit 2014  . Type II diabetes mellitus ([place]) 07/2016   Dx'd by two fasting glucoses > 126 (Hb a1c 6.2% at that time).      Past Surgical History:  Procedure Laterality Date  . CARDIAC CATHETERIZATION  2015   Normal coronaries  . CARDIOVERSION     DCCV 2015  . TONSILLECTOMY    . TRANSTHORACIC ECHOCARDIOGRAM  06/08/2018   (in context of acute CVA): EF 35-40%, mild/mod MR, mod/sev TR, severely dilated LA.    Family History  Problem Relation Age of Onset  . Diabetes Mother   . Congestive Heart Failure Mother   . Alcohol abuse Father   . Hypertension Father   . Stroke Father   . Heart disease Paternal Grandfather     Social History   Socioeconomic History  . Marital status: Married    Spouse name: Not on file  . Number of children:  Not on file  . Years of education: Not on file  . Highest education level: Not on file  Occupational History  . Not on file  Tobacco Use  . Smoking status: Former Smoker    Packs/day: 1.00    Years: 5.00    Pack years: 5.00    Types: Cigarettes    Quit date: 01/10/2013    Years since quitting: 6.3  . Smokeless tobacco: Never Used  Substance and Sexual Activity  . Alcohol use: Yes    Comment: drinks liquor daily  . Drug use: No  . Sexual activity: Not on file  Other Topics Concern  . Not on file  Social History Narrative   Married, 2 adult children.   Educ: 11th grade   Occup: "out of work".  Worked for [employer]--was let go for not abiding by [employer] per pt/wife.   Tob: former--quit 2014.  "Of and on prior".   Alc: long hx of drinking a pint and 1 and 1/2 pint of whiskey per day x  years---quit approx May 2020.Marland Kitchen   Drugs: none.   Social Determinants of Health   Financial Resource Strain:   . Difficulty of Paying Living Expenses:   Food Insecurity:   . Worried About Charity fundraiser in the Last Year:   . Arboriculturist in the Last Year:   Transportation Needs:   . Film/video editor (Medical):   Marland Kitchen Lack of Transportation (Non-Medical):   Physical Activity:   . Days of Exercise per Week:   . Minutes of Exercise per Session:   Stress:   . Feeling of Stress :   Social Connections:   . Frequency of Communication with Friends and Family:   . Frequency of Social Gatherings with Friends and Family:   . Attends Religious Services:   . Active Member of Clubs or Organizations:   . Attends Archivist Meetings:   Marland Kitchen Marital Status:   Intimate Partner Violence:   . Fear of Current or Ex-Partner:   . Emotionally Abused:   Marland Kitchen Physically Abused:   . Sexually Abused:     Outpatient Medications Prior to Visit  Medication Sig Dispense Refill  . allopurinol (ZYLOPRIM) 100 MG tablet TAKE TWO TABLETS BY MOUTH DAILY 60 tablet 3  . atorvastatin (LIPITOR) 40 MG tablet Take 1 tablet (40 mg total) by mouth daily. 90 tablet 3  . digoxin (LANOXIN) 0.125 MG tablet TAKE ONE TABLET BY MOUTH DAILY (Patient taking differently: every other day. ) 30 tablet 3  . folic acid (FOLVITE) 1 MG tablet 1 tab po qd 90 tablet 3  . lisinopril (ZESTRIL) 40 MG tablet 1/2 tab po qd 30 tablet 6  . pantoprazole (PROTONIX) 40 MG tablet Take 1 tablet (40 mg total) by mouth 2 (two) times daily. 60 tablet 6  . XARELTO 20 MG TABS tablet Take 1 tablet (20 mg total) by mouth daily with supper. 90 tablet 3  . glucose blood (CONTOUR NEXT TEST) test strip Use to check blood sugar once daily. (Patient not taking: Reported on 11/09/2018) 100 each 12  . metoprolol succinate (TOPROL-XL) 25 MG 24 hr tablet Take 25 mg by mouth daily.    Marland Kitchen MICROLET LANCETS MISC Use to check blood sugar once daily (Patient  not taking: Reported on 11/09/2018) 100 each 11  . ondansetron (ZOFRAN) 4 MG tablet Take 1 tablet (4 mg total) by mouth every 8 (eight) hours as needed for nausea or vomiting. (Patient not  taking: Reported on 11/09/2018) 20 tablet 0   No facility-administered medications prior to visit.    No Known Allergies  ROS Review of Systems  Constitutional: Negative for appetite change, chills, fatigue and fever.  HENT: Negative for congestion, dental problem, ear pain and sore throat.   Eyes: Negative for discharge, redness and visual disturbance.  Respiratory: Negative for cough, chest tightness, shortness of breath and wheezing.   Cardiovascular: Negative for chest pain, palpitations and leg swelling.  Gastrointestinal: Positive for constipation. Negative for abdominal pain, blood in stool, diarrhea, nausea and vomiting.  Genitourinary: Negative for difficulty urinating, dysuria, flank pain, frequency, hematuria and urgency.  Musculoskeletal: Positive for arthralgias (chronic R hip pain). Negative for back pain, joint swelling, myalgias and neck stiffness.  Skin: Negative for pallor and rash.  Neurological: Negative for dizziness, speech difficulty, weakness and headaches.  Hematological: Negative for adenopathy. Does not bruise/bleed easily.  Psychiatric/Behavioral: Negative for confusion and sleep disturbance. The patient is not nervous/anxious.     PE; Vitals with BMI 05/09/2019 01/28/2019 11/09/2018  Height 5' 5.5" 5\' 8"  5\' 8"   Weight 169 lbs 3 oz 166 lbs 3 oz 167 lbs 13 oz  BMI 27.72 [id number]  Systolic [id number]  Diastolic 76 83 85  Pulse 90 100 86    Gen: Alert, well appearing.  Patient is oriented to person, place, time, and situation. AFFECT: pleasant, lucid thought and speech. [id number]: no injection, icteris, swelling, or exudate.  EOMI, PERRLA. Mouth: lips without lesion/swelling.  Oral mucosa pink and moist. Oropharynx without erythema, exudate, or swelling.  Neck - No  masses or thyromegaly or limitation in range of motion.  No bruits. CV: irreg irreg, no m/r/g.   LUNGS: CTA bilat, nonlabored resps, good aeration in all lung fields. ABD: soft, NT, ND, BS normal.  No hepatospenomegaly or mass.  No bruits. EXT: no clubbing or cyanosis.  1+ bilat LL pitting edema.  Neuro: CN 2-12 intact bilaterally, strength 5/5 in proximal and distal upper extremities and lower extremities bilaterally.  No sensory deficits.  No tremor.  Gait unstable, uses walker 24/7.   No pronator drift. Musc: dec ROM R hip  Pertinent labs:  Lab Results  Component Value Date   TSH 1.21 07/31/2017   Lab Results  Component Value Date   WBC 7.0 07/24/2018   HGB 12.9 (L) 07/24/2018   HCT 39.1 07/24/2018   MCV 91.4 07/24/2018   PLT 286.0 07/24/2018   Lab Results  Component Value Date   CREATININE 0.82 11/15/2018   BUN 12 11/15/2018   NA 142 11/15/2018   K 3.9 11/15/2018   CL 101 11/15/2018   CO2 33 (H) 11/15/2018   Lab Results  Component Value Date   ALT 14 11/15/2018   AST 16 11/15/2018   ALKPHOS 90 11/15/2018   BILITOT 0.8 11/15/2018   Lab Results  Component Value Date   CHOL 144 11/15/2018   Lab Results  Component Value Date   HDL 58.90 11/15/2018   Lab Results  Component Value Date   LDLCALC 74 11/15/2018   Lab Results  Component Value Date   TRIG 55.0 11/15/2018   Lab Results  Component Value Date   CHOLHDL 2 11/15/2018   Lab Results  Component Value Date   PSA 1.90 07/31/2017   PSA 2.08 08/01/2016   Lab Results  Component Value Date   HGBA1C 5.7 11/15/2018   ASSESSMENT AND PLAN:   1) HTN: well controlled w/out any symptoms of low bp's.  Continue lisin and metop. Lytes/cr today.  2) HLD: tolerating statin. FLP and hepatic panel today.  3) Hx of CVA: repeat CT angio neck and head showed 50% R ICA stenosis, otherwise chronic right vertebral artery occlusion with distal segment reconstitution and R PICA filling. Multiple remote infarcts which  have not progressed since 05/2018 CT. Followed by neurologist.  4) DM: no home gluc monitoring.  Well controlled on no meds historically. HbA1c and fasting glucose today.  5) Constipation, chronic since CVA and stopping alcohol.  Likely due to inactivity. Instructions: buy metamucil fiber supplement and take one dose daily (directions on packaging). Also, buy over the counter senakot-S (generic is fine), 2 tabs every evening. If no BM in 4 days or so, take one capful of miralax powder (OTC, generic) 1-2 times per day until adequate BM.  6) Health maintenance exam: Reviewed age and gender appropriate health maintenance issues (prudent diet, regular exercise, health risks of tobacco and excessive alcohol, use of seatbelts, fire alarms in home, use of sunscreen).  Also reviewed age and gender appropriate health screening as well as vaccine recommendations. Vaccines: UTD including shingrix. Encouraged him to get covid vaccine. Labs: fasting HP+HbA1c (DM), hep c ab, and PSA. Prostate ca screening: DRE deferred by pt today, PSA. Colon ca screening: has never had colon ca screening-->ref GI today.  An After Visit Summary was printed and given to the patient.  FOLLOW UP:  Return in about 3 months (around 08/08/2019) for routine chronic illness f/u.  Signed:  Crissie Sickles, MD           05/09/2019

## 2019-05-09 NOTE — Patient Instructions (Addendum)
For constipation: buy metamucil fiber supplement and take one dose daily (directions on packaging). Also, buy over the counter senakot-S (generic is fine), 2 tabs every evening. If no BM in 4 days or so, take one capful of miralax powder (OTC, generic) 1-2 times per day until adequate BM.     Health Maintenance, Male Adopting a healthy lifestyle and getting preventive care are important in promoting health and wellness. Ask your health care provider about:  The right schedule for you to have regular tests and exams.  Things you can do on your own to prevent diseases and keep yourself healthy. What should I know about diet, weight, and exercise? Eat a healthy diet   Eat a diet that includes plenty of vegetables, fruits, low-fat dairy products, and lean protein.  Do not eat a lot of foods that are high in solid fats, added sugars, or sodium. Maintain a healthy weight Body mass index (BMI) is a measurement that can be used to identify possible weight problems. It estimates body fat based on height and weight. Your health care provider can help determine your BMI and help you achieve or maintain a healthy weight. Get regular exercise Get regular exercise. This is one of the most important things you can do for your health. Most adults should:  Exercise for at least 150 minutes each week. The exercise should increase your heart rate and make you sweat (moderate-intensity exercise).  Do strengthening exercises at least twice a week. This is in addition to the moderate-intensity exercise.  Spend less time sitting. Even light physical activity can be beneficial. Watch cholesterol and blood lipids Have your blood tested for lipids and cholesterol at 60 years of age, then have this test every 5 years. You may need to have your cholesterol levels checked more often if:  Your lipid or cholesterol levels are high.  You are older than 60 years of age.  You are at high risk for heart disease.  What should I know about cancer screening? Many types of cancers can be detected early and may often be prevented. Depending on your health history and family history, you may need to have cancer screening at various ages. This may include screening for:  Colorectal cancer.  Prostate cancer.  Skin cancer.  Lung cancer. What should I know about heart disease, diabetes, and high blood pressure? Blood pressure and heart disease  High blood pressure causes heart disease and increases the risk of stroke. This is more likely to develop in people who have high blood pressure readings, are of African descent, or are overweight.  Talk with your health care provider about your target blood pressure readings.  Have your blood pressure checked: ? Every 3-5 years if you are 2-16 years of age. ? Every year if you are 33 years old or older.  If you are between the ages of 46 and 65 and are a current or former smoker, ask your health care provider if you should have a one-time screening for abdominal aortic aneurysm (AAA). Diabetes Have regular diabetes screenings. This checks your fasting blood sugar level. Have the screening done:  Once every three years after age 45 if you are at a normal weight and have a low risk for diabetes.  More often and at a younger age if you are overweight or have a high risk for diabetes. What should I know about preventing infection? Hepatitis B If you have a higher risk for hepatitis B, you should be screened for  this virus. Talk with your health care provider to find out if you are at risk for hepatitis B infection. Hepatitis C Blood testing is recommended for:  Everyone born from [birth year] through 1966.  Anyone with known risk factors for hepatitis C. Sexually transmitted infections (STIs)  You should be screened each year for STIs, including gonorrhea and chlamydia, if: ? You are sexually active and are younger than 60 years of age. ? You are older than 60  years of age and your health care provider tells you that you are at risk for this type of infection. ? Your sexual activity has changed since you were last screened, and you are at increased risk for chlamydia or gonorrhea. Ask your health care provider if you are at risk.  Ask your health care provider about whether you are at high risk for HIV. Your health care provider may recommend a prescription medicine to help prevent HIV infection. If you choose to take medicine to prevent HIV, you should first get tested for HIV. You should then be tested every 3 months for as long as you are taking the medicine. Follow these instructions at home: Lifestyle  Do not use any products that contain nicotine or tobacco, such as cigarettes, e-cigarettes, and chewing tobacco. If you need help quitting, ask your health care provider.  Do not use street drugs.  Do not share needles.  Ask your health care provider for help if you need support or information about quitting drugs. Alcohol use  Do not drink alcohol if your health care provider tells you not to drink.  If you drink alcohol: ? Limit how much you have to 0-2 drinks a day. ? Be aware of how much alcohol is in your drink. In the [place] one drink equals one 12 oz bottle of beer (355 mL), one 5 oz glass of wine (148 mL), or one 1 oz glass of hard liquor (44 mL). General instructions  Schedule regular health, dental, and eye exams.  Stay current with your vaccines.  Tell your health care provider if: ? You often feel depressed. ? You have ever been abused or do not feel safe at home. Summary  Adopting a healthy lifestyle and getting preventive care are important in promoting health and wellness.  Follow your health care provider's instructions about healthy diet, exercising, and getting tested or screened for diseases.  Follow your health care provider's instructions on monitoring your cholesterol and blood pressure. This information is not  intended to replace advice given to you by your health care provider. Make sure you discuss any questions you have with your health care provider. Document Revised: 12/20/2017 Document Reviewed: 12/20/2017 Elsevier Patient Education  2020 [employer].

## 2019-05-10 LAB — HEPATITIS C ANTIBODY
Hepatitis C Ab: NONREACTIVE
SIGNAL TO CUT-OFF: 0.01 (ref <1.00)

## 2019-05-28 DIAGNOSIS — Z0289 Encounter for other administrative examinations: Secondary | ICD-10-CM

## 2019-06-04 ENCOUNTER — Other Ambulatory Visit: Payer: Self-pay | Admitting: Family Medicine

## 2019-06-11 DIAGNOSIS — U071 COVID-19: Secondary | ICD-10-CM

## 2019-06-11 HISTORY — DX: COVID-19: U07.1

## 2019-06-28 ENCOUNTER — Emergency Department (HOSPITAL_COMMUNITY): Payer: Medicare HMO

## 2019-06-28 ENCOUNTER — Observation Stay (HOSPITAL_COMMUNITY)
Admission: EM | Admit: 2019-06-28 | Discharge: 2019-06-29 | Disposition: A | Discharge status: Home / Self Care | Payer: Medicare HMO | Attending: Internal Medicine | Admitting: Internal Medicine

## 2019-06-28 ENCOUNTER — Encounter (HOSPITAL_COMMUNITY): Payer: Self-pay

## 2019-06-28 DIAGNOSIS — Z811 Family history of alcohol abuse and dependence: Secondary | ICD-10-CM | POA: Diagnosis not present

## 2019-06-28 DIAGNOSIS — G9389 Other specified disorders of brain: Secondary | ICD-10-CM | POA: Diagnosis not present

## 2019-06-28 DIAGNOSIS — Z79899 Other long term (current) drug therapy: Secondary | ICD-10-CM | POA: Insufficient documentation

## 2019-06-28 DIAGNOSIS — I10 Essential (primary) hypertension: Secondary | ICD-10-CM | POA: Diagnosis not present

## 2019-06-28 DIAGNOSIS — M1611 Unilateral primary osteoarthritis, right hip: Secondary | ICD-10-CM | POA: Diagnosis not present

## 2019-06-28 DIAGNOSIS — E785 Hyperlipidemia, unspecified: Secondary | ICD-10-CM | POA: Diagnosis not present

## 2019-06-28 DIAGNOSIS — R1312 Dysphagia, oropharyngeal phase: Secondary | ICD-10-CM | POA: Insufficient documentation

## 2019-06-28 DIAGNOSIS — Z7901 Long term (current) use of anticoagulants: Secondary | ICD-10-CM | POA: Insufficient documentation

## 2019-06-28 DIAGNOSIS — R531 Weakness: Principal | ICD-10-CM

## 2019-06-28 DIAGNOSIS — I69322 Dysarthria following cerebral infarction: Secondary | ICD-10-CM | POA: Diagnosis not present

## 2019-06-28 DIAGNOSIS — I69391 Dysphagia following cerebral infarction: Secondary | ICD-10-CM | POA: Diagnosis not present

## 2019-06-28 DIAGNOSIS — I69354 Hemiplegia and hemiparesis following cerebral infarction affecting left non-dominant side: Secondary | ICD-10-CM | POA: Insufficient documentation

## 2019-06-28 DIAGNOSIS — I6789 Other cerebrovascular disease: Secondary | ICD-10-CM | POA: Insufficient documentation

## 2019-06-28 DIAGNOSIS — I700 Atherosclerosis of aorta: Secondary | ICD-10-CM | POA: Diagnosis not present

## 2019-06-28 DIAGNOSIS — R824 Acetonuria: Secondary | ICD-10-CM | POA: Insufficient documentation

## 2019-06-28 DIAGNOSIS — I6930 Unspecified sequelae of cerebral infarction: Secondary | ICD-10-CM

## 2019-06-28 DIAGNOSIS — Z1811 Retained magnetic metal fragments: Secondary | ICD-10-CM | POA: Diagnosis not present

## 2019-06-28 DIAGNOSIS — I6501 Occlusion and stenosis of right vertebral artery: Secondary | ICD-10-CM | POA: Insufficient documentation

## 2019-06-28 DIAGNOSIS — R000 Tachycardia, unspecified: Secondary | ICD-10-CM | POA: Diagnosis not present

## 2019-06-28 DIAGNOSIS — U071 COVID-19: Secondary | ICD-10-CM | POA: Diagnosis not present

## 2019-06-28 DIAGNOSIS — Q281 Other malformations of precerebral vessels: Secondary | ICD-10-CM | POA: Insufficient documentation

## 2019-06-28 DIAGNOSIS — R29810 Facial weakness: Secondary | ICD-10-CM | POA: Diagnosis not present

## 2019-06-28 DIAGNOSIS — I4891 Unspecified atrial fibrillation: Secondary | ICD-10-CM | POA: Diagnosis not present

## 2019-06-28 DIAGNOSIS — Z87891 Personal history of nicotine dependence: Secondary | ICD-10-CM | POA: Diagnosis not present

## 2019-06-28 DIAGNOSIS — E119 Type 2 diabetes mellitus without complications: Secondary | ICD-10-CM | POA: Insufficient documentation

## 2019-06-28 DIAGNOSIS — R42 Dizziness and giddiness: Secondary | ICD-10-CM | POA: Diagnosis not present

## 2019-06-28 DIAGNOSIS — M10071 Idiopathic gout, right ankle and foot: Secondary | ICD-10-CM | POA: Diagnosis not present

## 2019-06-28 DIAGNOSIS — Z833 Family history of diabetes mellitus: Secondary | ICD-10-CM | POA: Diagnosis not present

## 2019-06-28 DIAGNOSIS — Z8249 Family history of ischemic heart disease and other diseases of the circulatory system: Secondary | ICD-10-CM | POA: Diagnosis not present

## 2019-06-28 DIAGNOSIS — Z823 Family history of stroke: Secondary | ICD-10-CM | POA: Insufficient documentation

## 2019-06-28 DIAGNOSIS — I340 Nonrheumatic mitral (valve) insufficiency: Secondary | ICD-10-CM | POA: Diagnosis not present

## 2019-06-28 LAB — CBC
HCT: 42 % (ref 39.0–52.0)
Hemoglobin: 13.2 g/dL (ref 13.0–17.0)
MCH: 29.8 pg (ref 26.0–34.0)
MCHC: 31.4 g/dL (ref 30.0–36.0)
MCV: 94.8 fL (ref 80.0–100.0)
Platelets: 201 10*3/uL (ref 150–400)
RBC: 4.43 MIL/uL (ref 4.22–5.81)
RDW: 13 % (ref 11.5–15.5)
WBC: 10.5 10*3/uL (ref 4.0–10.5)
nRBC: 0 % (ref 0.0–0.2)

## 2019-06-28 LAB — BASIC METABOLIC PANEL
Anion gap: 8 (ref 5–15)
BUN: 12 mg/dL (ref 6–20)
CO2: 31 mmol/L (ref 22–32)
Calcium: 8.9 mg/dL (ref 8.9–10.3)
Chloride: 102 mmol/L (ref 98–111)
Creatinine, Ser: 0.87 mg/dL (ref 0.61–1.24)
GFR calc Af Amer: >60 mL/min (ref >60)
GFR calc non Af Amer: >60 mL/min (ref >60)
Glucose, Bld: 103 mg/dL — ABNORMAL HIGH (ref 70–99)
Potassium: 3.8 mmol/L (ref 3.5–5.1)
Sodium: 141 mmol/L (ref 135–145)

## 2019-06-28 LAB — URINALYSIS, ROUTINE W REFLEX MICROSCOPIC
Bilirubin Urine: NEGATIVE
Glucose, UA: NEGATIVE mg/dL
Hgb urine dipstick: NEGATIVE
Ketones, ur: 20 mg/dL — AB
Leukocytes,Ua: NEGATIVE
Nitrite: NEGATIVE
Protein, ur: NEGATIVE mg/dL
Specific Gravity, Urine: 1.013 (ref 1.005–1.030)
pH: 6 (ref 5.0–8.0)

## 2019-06-28 NOTE — ED Notes (Signed)
Adela Lank wife [phone] home [phone]

## 2019-06-28 NOTE — ED Triage Notes (Signed)
Pt BIB [facility] for eval of generalized weakness x 12 hours. Pt reports he took a nap today and noted he was significantly weaker on waking. Stood up and felt pre-syncopal. Pt w/ pre-existing weakness, cognitive deficits and memory loss which wife reports is baseline d/t previous CVA. No new neuro deficits per EMS and wife. Hx of Afib, currently in afib 80-110.
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
## 2019-06-28 NOTE — ED Notes (Signed)
Shelia (Wife#[phone]) would like a call back on her husband's status.  Thank u

## 2019-06-29 ENCOUNTER — Observation Stay (HOSPITAL_COMMUNITY): Payer: Medicare HMO

## 2019-06-29 ENCOUNTER — Emergency Department (HOSPITAL_COMMUNITY): Payer: Medicare HMO

## 2019-06-29 ENCOUNTER — Encounter (HOSPITAL_COMMUNITY): Payer: Self-pay | Admitting: Emergency Medicine

## 2019-06-29 DIAGNOSIS — I6930 Unspecified sequelae of cerebral infarction: Secondary | ICD-10-CM

## 2019-06-29 DIAGNOSIS — R531 Weakness: Secondary | ICD-10-CM

## 2019-06-29 DIAGNOSIS — I10 Essential (primary) hypertension: Secondary | ICD-10-CM

## 2019-06-29 DIAGNOSIS — I4891 Unspecified atrial fibrillation: Secondary | ICD-10-CM

## 2019-06-29 LAB — HEPATIC FUNCTION PANEL
ALT: 20 U/L (ref 0–44)
AST: 20 U/L (ref 15–41)
Albumin: 4 g/dL (ref 3.5–5.0)
Alkaline Phosphatase: 81 U/L (ref 38–126)
Bilirubin, Direct: 0.3 mg/dL — ABNORMAL HIGH (ref 0.0–0.2)
Indirect Bilirubin: 0.9 mg/dL (ref 0.3–0.9)
Total Bilirubin: 1.2 mg/dL (ref 0.3–1.2)
Total Protein: 6.5 g/dL (ref 6.5–8.1)

## 2019-06-29 LAB — MAGNESIUM: Magnesium: 1.5 mg/dL — ABNORMAL LOW (ref 1.7–2.4)

## 2019-06-29 LAB — HIV ANTIBODY (ROUTINE TESTING W REFLEX): HIV Screen 4th Generation wRfx: NONREACTIVE

## 2019-06-29 LAB — DIGOXIN LEVEL: Digoxin Level: 0.4 ng/mL — ABNORMAL LOW (ref 0.8–2.0)

## 2019-06-29 LAB — CK: Total CK: 132 U/L (ref 49–397)

## 2019-06-29 LAB — FOLATE: Folate: 33.7 ng/mL (ref >5.9)

## 2019-06-29 LAB — TROPONIN I (HIGH SENSITIVITY)
Troponin I (High Sensitivity): 9 ng/L (ref <18)
Troponin I (High Sensitivity): 13 ng/L (ref <18)

## 2019-06-29 LAB — SARS CORONAVIRUS 2 BY RT PCR (HOSPITAL ORDER, PERFORMED IN ~~LOC~~ HOSPITAL LAB): SARS Coronavirus 2: POSITIVE — AB

## 2019-06-29 LAB — VITAMIN B12: Vitamin B-12: 576 pg/mL (ref 180–914)

## 2019-06-29 MED ORDER — SODIUM CHLORIDE 0.9% FLUSH
3.0000 mL | Freq: Two times a day (BID) | INTRAVENOUS | Status: DC
  Administered 2019-06-29: 3 mL via INTRAVENOUS

## 2019-06-29 MED ORDER — LISINOPRIL 20 MG PO TABS: 20.0000 mg | ORAL_TABLET | Freq: Every day | ORAL | 4 refills | Status: DC

## 2019-06-29 MED ORDER — DILTIAZEM LOAD VIA INFUSION
10.0000 mg | Freq: Once | INTRAVENOUS | Status: DC
  Filled 2019-06-29: qty 10

## 2019-06-29 MED ORDER — ONDANSETRON HCL 4 MG/2ML IJ SOLN: 4.0000 mg | Freq: Four times a day (QID) | INTRAMUSCULAR | Status: DC | PRN

## 2019-06-29 MED ORDER — DILTIAZEM HCL 25 MG/5ML IV SOLN
15.0000 mg | Freq: Once | INTRAVENOUS | Status: AC
  Administered 2019-06-29: 15 mg via INTRAVENOUS
  Filled 2019-06-29: qty 5

## 2019-06-29 MED ORDER — DILTIAZEM HCL-DEXTROSE 125-5 MG/125ML-% IV SOLN (PREMIX)
5.0000 mg/h | INTRAVENOUS | Status: DC
  Filled 2019-06-29: qty 125

## 2019-06-29 MED ORDER — ACETAMINOPHEN 650 MG RE SUPP: 650.0000 mg | Freq: Four times a day (QID) | RECTAL | Status: DC | PRN

## 2019-06-29 MED ORDER — ONDANSETRON HCL 4 MG PO TABS: 4.0000 mg | ORAL_TABLET | Freq: Four times a day (QID) | ORAL | Status: DC | PRN

## 2019-06-29 MED ORDER — ACETAMINOPHEN 325 MG PO TABS: 650.0000 mg | ORAL_TABLET | Freq: Four times a day (QID) | ORAL | Status: DC | PRN

## 2019-06-29 MED ORDER — POTASSIUM CHLORIDE IN NACL 20-0.9 MEQ/L-% IV SOLN
INTRAVENOUS | Status: DC
  Filled 2019-06-29: qty 1000

## 2019-06-29 MED ORDER — IOHEXOL 350 MG/ML SOLN
100.0000 mL | Freq: Once | INTRAVENOUS | Status: AC | PRN
  Administered 2019-06-29: 100 mL via INTRAVENOUS

## 2019-06-29 MED ORDER — SENNOSIDES-DOCUSATE SODIUM 8.6-50 MG PO TABS: 1.0000 | ORAL_TABLET | Freq: Every evening | ORAL | Status: DC | PRN

## 2019-06-29 MED ORDER — ATORVASTATIN CALCIUM 40 MG PO TABS
40.0000 mg | ORAL_TABLET | Freq: Every day | ORAL | Status: DC
  Administered 2019-06-29: 40 mg via ORAL
  Filled 2019-06-29: qty 1

## 2019-06-29 MED ORDER — RIVAROXABAN 20 MG PO TABS: 20.0000 mg | ORAL_TABLET | Freq: Every day | ORAL | Status: DC

## 2019-06-29 MED ORDER — METOPROLOL SUCCINATE ER 25 MG PO TB24
25.0000 mg | ORAL_TABLET | Freq: Every day | ORAL | Status: DC
  Administered 2019-06-29: 25 mg via ORAL
  Filled 2019-06-29: qty 1

## 2019-06-29 MED ORDER — LISINOPRIL 20 MG PO TABS
20.0000 mg | ORAL_TABLET | Freq: Every day | ORAL | Status: DC
  Administered 2019-06-29: 20 mg via ORAL
  Filled 2019-06-29: qty 1

## 2019-06-29 MED ORDER — PANTOPRAZOLE SODIUM 40 MG PO TBEC
40.0000 mg | DELAYED_RELEASE_TABLET | Freq: Two times a day (BID) | ORAL | Status: DC
  Administered 2019-06-29: 40 mg via ORAL
  Filled 2019-06-29: qty 1

## 2019-06-29 NOTE — Progress Notes (Signed)
Patient had 50 ml issovue 350 extravasation left upper arm dr Leonia Reeves assessed patient,iv was removedarm was elevated Ice was placed at site ,verbal instructions were given to patient,a verbal handoff was given to [place] RN Extravasation discharge orders were placed.

## 2019-06-29 NOTE — Discharge Summary (Signed)
Physician Discharge Summary  Robert [name] RDE:[id number] DOB: February 16, 1960 DOA: 06/28/2019  PCP: Tammi Sou, MD  Admit date: 06/28/2019 Discharge date: 06/29/2019  Admitted From: Home Disposition:  home Recommendations for Outpatient Follow-up:  1. Follow up with PCP in 1-2 weeks 2. Please obtain BMP/CBC in one week  Home Health: None Equipment/Devices none Discharge Condition: Stable CODE STATUS full code Diet recommendation: Cardiac Brief/Interim Summary:58 y.o. male with medical history significant for atrial fibrillation on Xarelto, history of CVA with residual dysarthria and left-sided weakness, and hypertension, presenting to the emergency department for evaluation of generalized weakness.  Patient reports that he woke up from a nap yesterday with generalized weakness, feeling as though he may not be able to support his own weight when he stood, but denies any lightheadedness or dizziness associated with this.  He denies any change in his residual dysarthria or dysphagia.  Denies any recent chest pain or palpitations, denies fevers or chills, denies cough or shortness of breath, and denies any recent fall or trauma.  ED Course: Upon arrival to the ED, patient is found to be afebrile, saturating well on room air, heart rate 103, and blood pressure 160/90.  EKG features atrial fibrillation with rate 79.  Noncontrast head CT is negative for acute findings.  Chest x-ray notable for mild cardiomegaly without acute findings.  Chemistry panel and CBC are unremarkable and high-sensitivity troponin is normal.  Ketonuria noted on UA.  Diltiazem infusion was ordered for atrial fibrillation with RVR but rate normalized prior to starting the medication.  MRI brain was ordered by the ED physician but could not be performed due to metal in the patient's left orbit.  ED physician discussed with neurology who advised that CT perfusion study could be obtained if there was concern for CVA and neurology  could be formally consulted if needed based on those results.  Patient remains hemodynamically stable, in no apparent respiratory distress, but has persistent generalized weakness and will be observed in the hospital for ongoing evaluation and management of this. Discharge Diagnoses:  Principal Problem:   General weakness Active Problems:   Hypertension   History of CVA with residual deficit   Atrial fibrillation with RVR (HCC)  #1 history of stroke patient admitted with generalized weakness with history of stroke and residual left-sided weakness dysphagia and dysarthria with no acute or new deficits.  He ambulates with a cane or a walker at home.  He ambulated to the restroom with a cane today. Head CT shows no acute findings. CT angiogram with no acute findings either. Patient was asked to follow-up with neurology as an outpatient and to continue Xarelto and Lipitor at home.  This was discussed with patient's wife.  #2 incidentally Covid positive with no symptoms.  Asked to quarantine at home for 14 days.  #3 hypertension continue lisinopril and metoprolol.   Estimated body mass index is 24.25 kg/m as calculated from the following:   Height as of this encounter: 5\' 10"  (1.778 m).   Weight as of this encounter: 76.7 kg.  Discharge Instructions  Discharge Instructions    Diet - low sodium heart healthy   Complete by: As directed    Increase activity slowly   Complete by: As directed      Allergies as of 06/29/2019   No Known Allergies     Medication List    STOP taking these medications   glucose blood test strip Commonly known as: Contour Next Test   Microlet Lancets Misc  ondansetron 4 MG tablet Commonly known as: ZOFRAN     TAKE these medications   allopurinol 100 MG tablet Commonly known as: ZYLOPRIM TAKE TWO TABLETS BY MOUTH DAILY   atorvastatin 40 MG tablet Commonly known as: LIPITOR Take 1 tablet (40 mg total) by mouth daily.   digoxin 0.125 MG  tablet Commonly known as: LANOXIN TAKE ONE TABLET BY MOUTH DAILY What changed:   how much to take  how to take this  when to take this   folic acid 1 MG tablet Commonly known as: FOLVITE 1 tab po qd   lisinopril 20 MG tablet Commonly known as: ZESTRIL Take 1 tablet (20 mg total) by mouth daily. Start taking on: June 30, 2019 What changed:   medication strength  how much to take  how to take this  when to take this  additional instructions   metoprolol succinate 25 MG 24 hr tablet Commonly known as: TOPROL-XL Take 25 mg by mouth daily.   pantoprazole 40 MG tablet Commonly known as: PROTONIX TAKE ONE TABLET BY MOUTH TWICE A DAY   Xarelto 20 MG Tabs tablet Generic drug: rivaroxaban Take 1 tablet (20 mg total) by mouth daily with supper.       Follow-up Information    McGowen, Adrian Blackwater, MD Follow up.   Specialty: Family Medicine Contact information: [address] [place] [address] [phone]              No Known Allergies  Consultations: None Procedures/Studies: CT Angio Head W or Wo Contrast  Result Date: 06/29/2019 CLINICAL DATA:  Weakness.  History of atrial fibrillation. EXAM: CT ANGIOGRAPHY HEAD AND NECK CT PERFUSION BRAIN TECHNIQUE: Multidetector CT imaging of the head and neck was performed using the standard protocol during bolus administration of intravenous contrast. Multiplanar CT image reconstructions and MIPs were obtained to evaluate the vascular anatomy. Carotid stenosis measurements (when applicable) are obtained utilizing NASCET criteria, using the distal internal carotid diameter as the denominator. Multiphase CT imaging of the brain was performed following IV bolus contrast injection. Subsequent parametric perfusion maps were calculated using RAPID software. CONTRAST:  160mL OMNIPAQUE IOHEXOL 350 MG/ML SOLN COMPARISON:  CT head without contrast 06/29/2019. CTA head and neck 04/15/2019 and 06/07/2018. FINDINGS: CTA NECK  FINDINGS Aortic arch: A 3 vessel arch configuration is present. Atherosclerotic changes are noted in the distal aorta and at the origin of the left subclavian artery without significant stenosis of the great vessel origins. No aneurysm is present. Right carotid system: Right common carotid artery is within normal limits. Atherosclerotic changes are again noted at the right carotid bifurcation. The lumen is narrowed to 3.5 mm. Less and 50% stenosis is calculated relative to the more distal vessel. Mild tortuosity is noted without significant stenosis. Left carotid system: The left common carotid artery is within normal limits. Atherosclerotic changes are present at the bifurcation without significant stenosis relative to the more distal vessel. The cervical left ICA is otherwise normal. Vertebral arteries: The left vertebral artery is the dominant vessel. No significant stenosis is present in the left vertebral artery. The right vertebral artery is occluded proximally. It is briefly reconstituted at the distal V2 segment, but then occluded again just before the dura. Skeleton: Anterior osteophytes are fused through the entire cervical spine. Upper thoracic vertebral bodies are fused as well. T1-2 is not fused. Osseous foraminal narrowing is present at T1-2, right greater than left. Other neck: The soft tissues the neck otherwise unremarkable. No significant  mucosal lesions are present. Salivary glands are within normal limits. Thyroid is normal. No significant adenopathy is present. Upper chest: The lung apices are clear. Thoracic inlet is within normal limits. Review of the MIP images confirms the above findings CTA HEAD FINDINGS Anterior circulation: Asymmetric attenuation of the cavernous left ICA is present without a focal stenosis. Atherosclerotic changes are present in the cavernous internal carotid arteries bilaterally. Right M1 segment is smaller than the left without focal stenosis. A1 segments are normal  bilaterally. The anterior communicating artery is patent. Right MCA branch vessels are attenuated compared to the left without a significant proximal stenosis or occlusion. Mild diffuse atherosclerotic changes are present. No significant proximal stenosis or occlusion is present in the ACA vessels. Posterior circulation: The left vertebral artery is the dominant vessel. Retrograde filling is present in the distal V4 segment on the right to the PICA. Basilar artery is small. The left posterior cerebral artery originates from the basilar tip. The right posterior cerebral artery is of fetal type. Mild distal small vessel irregularity is present in the PCA branches bilaterally without a significant proximal stenosis or occlusion. Venous sinuses: The dural sinuses are patent. Straight sinus deep cerebral veins are intact. Right transverse sinus is dominant. Cortical veins are normal. No vascular malformations are present. Anatomic variants: Fetal type right posterior cerebral artery. Review of the MIP images confirms the above findings CT Brain Perfusion Findings: ASPECTS: 10/10 CBF (<30%) Volume: 75mL Perfusion (Tmax>6.0s) volume: 55mL Mismatch Volume: 7mL Infarction Location:Right MCA territory IMPRESSION: 1. Right MCA territory infarct is remote. 2. No significant ischemic penumbra. 3. There may be a slight expansion of the infarct. This could be artifact. 4. No emergent large vessel occlusion. 5. Occluded proximal right vertebral artery. 6. Retrograde filling of distal right V4 segment to the PICA. 7. Hypoplastic basilar artery. 8. Fetal type right posterior cerebral artery. 9. Mild distal small vessel disease without a significant proximal stenosis, aneurysm, or branch vessel occlusion within the Circle of Willis. 10. Diffuse idiopathic skeletal hyperostosis of the cervical and upper thoracic spine. There is no fusion at the T1-2 level. Foraminal narrowing is greatest at this level. 11. Aortic Atherosclerosis  (ICD10-I70.0). Electronically Signed   By: San Morelle M.D.   On: 06/29/2019 12:30   CT Head Wo Contrast  Result Date: 06/29/2019 CLINICAL DATA:  Weakness EXAM: CT HEAD WITHOUT CONTRAST TECHNIQUE: Contiguous axial images were obtained from the base of the skull through the vertex without intravenous contrast. COMPARISON:  February 15, 2019 FINDINGS: Brain: Area of encephalomalacia involving the right MCA territory. No new acute infarct or extra-axial collections. There is a stable left pons and left basal ganglia lacunar infarct. There is dilatation the ventricles and sulci consistent with age-related atrophy. Low-attenuation changes in the deep white matter consistent with small vessel ischemia. Normal gray-white differentiation. Ventricles are normal in size and contour. Vascular: No hyperdense vessel or unexpected calcification. Skull: The skull is intact. No fracture or focal lesion identified. Sinuses/Orbits: The visualized paranasal sinuses and mastoid air cells are clear. The orbits and globes intact. Other: None IMPRESSION: No acute intracranial abnormality. Prior right MCA territory infarct. Findings consistent with age related atrophy and chronic small vessel ischemia Electronically Signed   By: Prudencio Pair M.D.   On: 06/29/2019 00:20   CT Angio Neck W and/or Wo Contrast  Result Date: 06/29/2019 CLINICAL DATA:  Weakness.  History of atrial fibrillation. EXAM: CT ANGIOGRAPHY HEAD AND NECK CT PERFUSION BRAIN TECHNIQUE: Multidetector CT imaging of the  head and neck was performed using the standard protocol during bolus administration of intravenous contrast. Multiplanar CT image reconstructions and MIPs were obtained to evaluate the vascular anatomy. Carotid stenosis measurements (when applicable) are obtained utilizing NASCET criteria, using the distal internal carotid diameter as the denominator. Multiphase CT imaging of the brain was performed following IV bolus contrast injection.  Subsequent parametric perfusion maps were calculated using RAPID software. CONTRAST:  156mL OMNIPAQUE IOHEXOL 350 MG/ML SOLN COMPARISON:  CT head without contrast 06/29/2019. CTA head and neck 04/15/2019 and 06/07/2018. FINDINGS: CTA NECK FINDINGS Aortic arch: A 3 vessel arch configuration is present. Atherosclerotic changes are noted in the distal aorta and at the origin of the left subclavian artery without significant stenosis of the great vessel origins. No aneurysm is present. Right carotid system: Right common carotid artery is within normal limits. Atherosclerotic changes are again noted at the right carotid bifurcation. The lumen is narrowed to 3.5 mm. Less and 50% stenosis is calculated relative to the more distal vessel. Mild tortuosity is noted without significant stenosis. Left carotid system: The left common carotid artery is within normal limits. Atherosclerotic changes are present at the bifurcation without significant stenosis relative to the more distal vessel. The cervical left ICA is otherwise normal. Vertebral arteries: The left vertebral artery is the dominant vessel. No significant stenosis is present in the left vertebral artery. The right vertebral artery is occluded proximally. It is briefly reconstituted at the distal V2 segment, but then occluded again just before the dura. Skeleton: Anterior osteophytes are fused through the entire cervical spine. Upper thoracic vertebral bodies are fused as well. T1-2 is not fused. Osseous foraminal narrowing is present at T1-2, right greater than left. Other neck: The soft tissues the neck otherwise unremarkable. No significant mucosal lesions are present. Salivary glands are within normal limits. Thyroid is normal. No significant adenopathy is present. Upper chest: The lung apices are clear. Thoracic inlet is within normal limits. Review of the MIP images confirms the above findings CTA HEAD FINDINGS Anterior circulation: Asymmetric attenuation of the  cavernous left ICA is present without a focal stenosis. Atherosclerotic changes are present in the cavernous internal carotid arteries bilaterally. Right M1 segment is smaller than the left without focal stenosis. A1 segments are normal bilaterally. The anterior communicating artery is patent. Right MCA branch vessels are attenuated compared to the left without a significant proximal stenosis or occlusion. Mild diffuse atherosclerotic changes are present. No significant proximal stenosis or occlusion is present in the ACA vessels. Posterior circulation: The left vertebral artery is the dominant vessel. Retrograde filling is present in the distal V4 segment on the right to the PICA. Basilar artery is small. The left posterior cerebral artery originates from the basilar tip. The right posterior cerebral artery is of fetal type. Mild distal small vessel irregularity is present in the PCA branches bilaterally without a significant proximal stenosis or occlusion. Venous sinuses: The dural sinuses are patent. Straight sinus deep cerebral veins are intact. Right transverse sinus is dominant. Cortical veins are normal. No vascular malformations are present. Anatomic variants: Fetal type right posterior cerebral artery. Review of the MIP images confirms the above findings CT Brain Perfusion Findings: ASPECTS: 10/10 CBF (<30%) Volume: 44mL Perfusion (Tmax>6.0s) volume: 66mL Mismatch Volume: 34mL Infarction Location:Right MCA territory IMPRESSION: 1. Right MCA territory infarct is remote. 2. No significant ischemic penumbra. 3. There may be a slight expansion of the infarct. This could be artifact. 4. No emergent large vessel occlusion. 5. Occluded proximal  right vertebral artery. 6. Retrograde filling of distal right V4 segment to the PICA. 7. Hypoplastic basilar artery. 8. Fetal type right posterior cerebral artery. 9. Mild distal small vessel disease without a significant proximal stenosis, aneurysm, or branch vessel  occlusion within the Circle of Willis. 10. Diffuse idiopathic skeletal hyperostosis of the cervical and upper thoracic spine. There is no fusion at the T1-2 level. Foraminal narrowing is greatest at this level. 11. Aortic Atherosclerosis (ICD10-I70.0). Electronically Signed   By: San Morelle M.D.   On: 06/29/2019 12:30   CT CEREBRAL PERFUSION W CONTRAST  Result Date: 06/29/2019 CLINICAL DATA:  Weakness.  History of atrial fibrillation. EXAM: CT ANGIOGRAPHY HEAD AND NECK CT PERFUSION BRAIN TECHNIQUE: Multidetector CT imaging of the head and neck was performed using the standard protocol during bolus administration of intravenous contrast. Multiplanar CT image reconstructions and MIPs were obtained to evaluate the vascular anatomy. Carotid stenosis measurements (when applicable) are obtained utilizing NASCET criteria, using the distal internal carotid diameter as the denominator. Multiphase CT imaging of the brain was performed following IV bolus contrast injection. Subsequent parametric perfusion maps were calculated using RAPID software. CONTRAST:  145mL OMNIPAQUE IOHEXOL 350 MG/ML SOLN COMPARISON:  CT head without contrast 06/29/2019. CTA head and neck 04/15/2019 and 06/07/2018. FINDINGS: CTA NECK FINDINGS Aortic arch: A 3 vessel arch configuration is present. Atherosclerotic changes are noted in the distal aorta and at the origin of the left subclavian artery without significant stenosis of the great vessel origins. No aneurysm is present. Right carotid system: Right common carotid artery is within normal limits. Atherosclerotic changes are again noted at the right carotid bifurcation. The lumen is narrowed to 3.5 mm. Less and 50% stenosis is calculated relative to the more distal vessel. Mild tortuosity is noted without significant stenosis. Left carotid system: The left common carotid artery is within normal limits. Atherosclerotic changes are present at the bifurcation without significant stenosis  relative to the more distal vessel. The cervical left ICA is otherwise normal. Vertebral arteries: The left vertebral artery is the dominant vessel. No significant stenosis is present in the left vertebral artery. The right vertebral artery is occluded proximally. It is briefly reconstituted at the distal V2 segment, but then occluded again just before the dura. Skeleton: Anterior osteophytes are fused through the entire cervical spine. Upper thoracic vertebral bodies are fused as well. T1-2 is not fused. Osseous foraminal narrowing is present at T1-2, right greater than left. Other neck: The soft tissues the neck otherwise unremarkable. No significant mucosal lesions are present. Salivary glands are within normal limits. Thyroid is normal. No significant adenopathy is present. Upper chest: The lung apices are clear. Thoracic inlet is within normal limits. Review of the MIP images confirms the above findings CTA HEAD FINDINGS Anterior circulation: Asymmetric attenuation of the cavernous left ICA is present without a focal stenosis. Atherosclerotic changes are present in the cavernous internal carotid arteries bilaterally. Right M1 segment is smaller than the left without focal stenosis. A1 segments are normal bilaterally. The anterior communicating artery is patent. Right MCA branch vessels are attenuated compared to the left without a significant proximal stenosis or occlusion. Mild diffuse atherosclerotic changes are present. No significant proximal stenosis or occlusion is present in the ACA vessels. Posterior circulation: The left vertebral artery is the dominant vessel. Retrograde filling is present in the distal V4 segment on the right to the PICA. Basilar artery is small. The left posterior cerebral artery originates from the basilar tip. The right posterior cerebral  artery is of fetal type. Mild distal small vessel irregularity is present in the PCA branches bilaterally without a significant proximal  stenosis or occlusion. Venous sinuses: The dural sinuses are patent. Straight sinus deep cerebral veins are intact. Right transverse sinus is dominant. Cortical veins are normal. No vascular malformations are present. Anatomic variants: Fetal type right posterior cerebral artery. Review of the MIP images confirms the above findings CT Brain Perfusion Findings: ASPECTS: 10/10 CBF (<30%) Volume: 3mL Perfusion (Tmax>6.0s) volume: 65mL Mismatch Volume: 61mL Infarction Location:Right MCA territory IMPRESSION: 1. Right MCA territory infarct is remote. 2. No significant ischemic penumbra. 3. There may be a slight expansion of the infarct. This could be artifact. 4. No emergent large vessel occlusion. 5. Occluded proximal right vertebral artery. 6. Retrograde filling of distal right V4 segment to the PICA. 7. Hypoplastic basilar artery. 8. Fetal type right posterior cerebral artery. 9. Mild distal small vessel disease without a significant proximal stenosis, aneurysm, or branch vessel occlusion within the Circle of Willis. 10. Diffuse idiopathic skeletal hyperostosis of the cervical and upper thoracic spine. There is no fusion at the T1-2 level. Foraminal narrowing is greatest at this level. 11. Aortic Atherosclerosis (ICD10-I70.0). Electronically Signed   By: San Morelle M.D.   On: 06/29/2019 12:30   DG Chest Portable 1 View  Result Date: 06/29/2019 CLINICAL DATA:  Generalized weakness. EXAM: PORTABLE CHEST 1 VIEW COMPARISON:  06/10/2018 FINDINGS: Mild cardiomegaly. Normal mediastinal contours. No pulmonary edema or pleural fluid. No confluent airspace disease. No pneumothorax. No acute osseous abnormalities are seen. IMPRESSION: Mild cardiomegaly. No congestive failure or acute chest findings. Electronically Signed   By: Keith Rake M.D.   On: 06/29/2019 00:11    (Echo, Carotid, EGD, Colonoscopy, ERCP)    Subjective: Patient awake alert resting in bed denies any cough shortness of breath chest  pain or fever.  He stays home all the time he does not leave the house.  [place]  His wife is asymptomatic grandchildren and asymptomatic.  Discharge Exam: Vitals:   06/29/19 0653 06/29/19 1057  BP: (!) 144/80 136/80  Pulse: 65   Resp: 18   Temp: 99.1 F (37.3 C) 98.6 F (37 C)  SpO2: 99% 100%   Vitals:   06/29/19 0545 06/29/19 0615 06/29/19 0653 06/29/19 1057  BP:   (!) 144/80 136/80  Pulse: 78 66 65   Resp:   18   Temp:   99.1 F (37.3 C) 98.6 F (37 C)  TempSrc:   Oral Oral  SpO2: 92% 100% 99% 100%  Weight:      Height:        General: Pt is alert, awake, not in acute distress Cardiovascular: RRR, S1/S2 +, no rubs, no gallops Respiratory: CTA bilaterally, no wheezing, no rhonchi Abdominal: Soft, NT, ND, bowel sounds + Extremities: no edema, no cyanosis    The results of significant diagnostics from this hospitalization (including imaging, microbiology, ancillary and laboratory) are listed below for reference.     Microbiology: Recent Results (from the past 240 hour(s))  SARS Coronavirus 2 by RT PCR (hospital order, performed in [facility] hospital lab) Nasopharyngeal Nasopharyngeal Swab     Status: Abnormal   Collection Time: 06/29/19  5:18 AM   Specimen: Nasopharyngeal Swab  Result Value Ref Range Status   SARS Coronavirus 2 POSITIVE (A) NEGATIVE Final    Comment: RESULT CALLED TO, READ BACK BY AND VERIFIED WITH: RN Corinda Gubler [id number] 06/29/19 KB (NOTE) SARS-CoV-2 target  nucleic acids are DETECTED  SARS-CoV-2 RNA is generally detectable in upper respiratory specimens  during the acute phase of infection.  Positive results are indicative  of the presence of the identified virus, but do not rule out bacterial infection or co-infection with other pathogens not detected by the test.  Clinical correlation with patient history and  other diagnostic information is necessary to determine patient infection status.  The expected result  is negative.  Fact Sheet for Patients:   StrictlyIdeas.no   Fact Sheet for Healthcare Providers:   BankingDealers.co.za    This test is not yet approved or cleared by the [place] FDA and  has been authorized for detection and/or diagnosis of SARS-CoV-2 by FDA under an Emergency Use Authorization (EUA).  This EUA will remain in effect (meaning this test  can be used) for the duration of  the COVID-19 declaration under Section 564(b)(1) of the Act, 21 U.S.C. section 360-bbb-3(b)(1), unless the authorization is terminated or revoked sooner.  Performed at [facility], [place] [address] [place], [place] [address]      Labs: BNP (last 3 results) No results for input(s): BNP in the last 8760 hours. Basic Metabolic Panel: Recent Labs  Lab 06/28/19 1627  NA 141  K 3.8  CL 102  CO2 31  GLUCOSE 103*  BUN 12  CREATININE 0.87  CALCIUM 8.9   Liver Function Tests: No results for input(s): AST, ALT, ALKPHOS, BILITOT, PROT, ALBUMIN in the last 168 hours. No results for input(s): LIPASE, AMYLASE in the last 168 hours. No results for input(s): AMMONIA in the last 168 hours. CBC: Recent Labs  Lab 06/28/19 1627  WBC 10.5  HGB 13.2  HCT 42.0  MCV 94.8  PLT 201   Cardiac Enzymes: No results for input(s): CKTOTAL, CKMB, CKMBINDEX, TROPONINI in the last 168 hours. BNP: Invalid input(s): POCBNP CBG: No results for input(s): GLUCAP in the last 168 hours. D-Dimer No results for input(s): DDIMER in the last 72 hours. Hgb A1c No results for input(s): HGBA1C in the last 72 hours. Lipid Profile No results for input(s): CHOL, HDL, LDLCALC, TRIG, CHOLHDL, LDLDIRECT in the last 72 hours. Thyroid function studies No results for input(s): TSH, T4TOTAL, T3FREE, THYROIDAB in the last 72 hours.  Invalid input(s): FREET3 Anemia work up No results for input(s): VITAMINB12, FOLATE, FERRITIN, TIBC, IRON, RETICCTPCT in the last 72  hours. Urinalysis    Component Value Date/Time   COLORURINE YELLOW 06/28/2019 2022   APPEARANCEUR CLEAR 06/28/2019 2022   LABSPEC 1.013 06/28/2019 2022   PHURINE 6.0 06/28/2019 2022   GLUCOSEU NEGATIVE 06/28/2019 2022   HGBUR NEGATIVE 06/28/2019 2022   BILIRUBINUR NEGATIVE 06/28/2019 2022   KETONESUR 20 (A) 06/28/2019 2022   PROTEINUR NEGATIVE 06/28/2019 2022   NITRITE NEGATIVE 06/28/2019 2022   LEUKOCYTESUR NEGATIVE 06/28/2019 2022   Sepsis Labs Invalid input(s): PROCALCITONIN,  WBC,  LACTICIDVEN Microbiology Recent Results (from the past 240 hour(s))  SARS Coronavirus 2 by RT PCR (hospital order, performed in [facility] hospital lab) Nasopharyngeal Nasopharyngeal Swab     Status: Abnormal   Collection Time: 06/29/19  5:18 AM   Specimen: Nasopharyngeal Swab  Result Value Ref Range Status   SARS Coronavirus 2 POSITIVE (A) NEGATIVE Final    Comment: RESULT CALLED TO, READ BACK BY AND VERIFIED WITH: RN Corinda Gubler [id number] 06/29/19 KB (NOTE) SARS-CoV-2 target nucleic acids are DETECTED  SARS-CoV-2 RNA is generally detectable in upper respiratory specimens  during the acute phase of infection.  Positive  results are indicative  of the presence of the identified virus, but do not rule out bacterial infection or co-infection with other pathogens not detected by the test.  Clinical correlation with patient history and  other diagnostic information is necessary to determine patient infection status.  The expected result is negative.  Fact Sheet for Patients:   StrictlyIdeas.no   Fact Sheet for Healthcare Providers:   BankingDealers.co.za    This test is not yet approved or cleared by the [place] FDA and  has been authorized for detection and/or diagnosis of SARS-CoV-2 by FDA under an Emergency Use Authorization (EUA).  This EUA will remain in effect (meaning this test  can be used) for the duration of  the COVID-19 declaration  under Section 564(b)(1) of the Act, 21 U.S.C. section 360-bbb-3(b)(1), unless the authorization is terminated or revoked sooner.  Performed at [facility], [place] [address], [place], [place] [address]      Time coordinating discharge:  39 minutes  SIGNED:   Georgette Shell, MD  [facility] 06/29/2019, 12:57 PM Pager   If 7PM-7AM, please contact night-coverage [email or web address] Password [email or web address]

## 2019-06-29 NOTE — Progress Notes (Signed)
PT Cancellation Note  Patient Details Name: Elo Marmolejos MRN: [id number] DOB: September 25, 1960   Cancelled Treatment:    Reason Eval/Treat Not Completed: Patient at procedure or test/unavailable (Pt in CT,will check back tomorrow.)   Denice Paradise 06/29/2019, 11:42 AM Rayansh Herbst W,PT Acute Rehabilitation Services Pager:  [phone]  Office:  [phone]

## 2019-06-29 NOTE — ED Notes (Signed)
IV team at bedside 

## 2019-06-29 NOTE — Progress Notes (Signed)
Pt cant have MRI scan due to metal in left orbit.

## 2019-06-29 NOTE — Progress Notes (Signed)
PT Cancellation Note  Patient Details Name: Robert [name] MRN: [id number] DOB: 1960/05/17   Cancelled Treatment:    Reason Eval/Treat Not Completed: Other (comment) (Pt Covid positive. Currently on 3E - moving to 5W. )Pt not in negative pressure room and nursing asked to HOLD until after pt is moved. Will reattempt as able.    Godfrey Pick Toccara Alford 06/29/2019, 8:40 AM Josslyn Ciolek W,PT Acute Rehabilitation Services Pager:  [phone]  Office:  [phone]

## 2019-06-29 NOTE — ED Notes (Signed)
Breakfast Ordered 

## 2019-06-29 NOTE — Progress Notes (Signed)
Robert [name] is a 59 y.o. male patient admitted from [place] awake, alert - oriented  X 4 - no acute distress noted.  VSS - Blood pressure 136/80, pulse 65, temperature 98.6 F (37 C), temperature source Oral, resp. rate 18, height 5\' 10"  (1.778 m), weight 76.7 kg, SpO2 100 %. IV in place, occlusive dsg intact without redness.  Orientation to room, and floor completed with information packet given to patient.  Call light within reach, patient able to voice, and demonstrate understanding.  Skin, clean-dry- intact without evidence of bruising, or skin tears. No evidence of skin break down noted on exam. Patient is comfortably in bed.     Dene Gentry, RN 06/29/2019 1:55 PM

## 2019-06-29 NOTE — Progress Notes (Signed)
Robert [name] to be D/C'd Home per MD order.  Discussed with the patient and all questions fully answered.  VSS, Skin clean, dry and intact without evidence of skin break down, no evidence of skin tears noted. IV catheter discontinued intact. Site without signs and symptoms of complications. Dressing and pressure applied.  An After Visit Summary was printed and given to the patient.  D/c education completed with patient/family including follow up instructions, medication list, d/c activities limitations if indicated, with other d/c instructions as indicated by MD - patient able to verbalize understanding, all questions fully answered.   Patient instructed to return to ED, call 911, or call MD for any changes in condition.   Patient escorted via [place], and D/C home via private auto.  Robert [name] 06/29/2019 4:22 PM

## 2019-06-29 NOTE — ED Notes (Signed)
Attempted IV stick x 2  

## 2019-06-29 NOTE — ED Notes (Signed)
Pt IV infiltrated in CT scan, IV consult placed

## 2019-06-29 NOTE — Progress Notes (Signed)
Patient home with wife that works at airport [employer] in [place]. complains of weakness, with low grade temp with sweating, no denies frequent coughing Stroke in past affecting left side 1 year and month ago. With a Right chronic hip pain.

## 2019-06-29 NOTE — H&P (Signed)
History and Physical    Robert [name] NAT:[id number] DOB: June 11, 1960 DOA: 06/28/2019  PCP: Tammi Sou, MD   Patient coming from: Home   Chief Complaint: General weakness   HPI: Robert [name] is a 59 y.o. male with medical history significant for atrial fibrillation on Xarelto, history of CVA with residual dysarthria and left-sided weakness, and hypertension, presenting to the emergency department for evaluation of generalized weakness.  Patient reports that he woke up from a nap yesterday with generalized weakness, feeling as though he may not be able to support his own weight when he stood, but denies any lightheadedness or dizziness associated with this.  He denies any change in his residual dysarthria or dysphagia.  Denies any recent chest pain or palpitations, denies fevers or chills, denies cough or shortness of breath, and denies any recent fall or trauma.  ED Course: Upon arrival to the ED, patient is found to be afebrile, saturating well on room air, heart rate 103, and blood pressure 160/90.  EKG features atrial fibrillation with rate 79.  Noncontrast head CT is negative for acute findings.  Chest x-ray notable for mild cardiomegaly without acute findings.  Chemistry panel and CBC are unremarkable and high-sensitivity troponin is normal.  Ketonuria noted on UA.  Diltiazem infusion was ordered for atrial fibrillation with RVR but rate normalized prior to starting the medication.  MRI brain was ordered by the ED physician but could not be performed due to metal in the patient's left orbit.  ED physician discussed with neurology who advised that CT perfusion study could be obtained if there was concern for CVA and neurology could be formally consulted if needed based on those results.  Patient remains hemodynamically stable, in no apparent respiratory distress, but has persistent generalized weakness and will be observed in the hospital for ongoing evaluation and management of this.  Review  of Systems:  All other systems reviewed and apart from HPI, are negative.  Past Medical History:  Diagnosis Date  . Alcohol abuse    ongoing as of 07/2017.  Quit after CVA 05/2018  . Arthritis   . Atrial fibrillation ([place])    Not candidate for anticoagulation b/c of GI bleeding; had to get transfused.  Then, pt had CVA 05/2018 and he was put on xarelto and he quit drinking.  . CVA (cerebral vascular accident) ([place]) 05/2018   large R MCA territory infarct.  CTA neg for large cerebral vessel occlusion.  R ICA 70% stenosis, L clear. Rpt CTA neck ordered by neuro as of 01/2019.  Marland Kitchen Gastric ulcer    hx of--unclear (pt and wife deny this)  . Gout    Usually MTP joint  . History of digital clubbing    "all my adult life"--CXR 08/2016 = bronchitic changes o/w normal.  . Hyperkalemia 04/2017   Normalized with decreasing lisinopril from 40 mg qd to 20 mg qd.  . Hyperlipidemia 07/2016   Holding off on statin until pt quits drinking.  Back on statin after CVA 05/2018  . Hypertension   . Nonischemic cardiomyopathy ([place]) 2015; 2020   2015: "resolved" (w/medical therapy?).  05/2018 EF 35-40%, with TR and MR and ++LAE.  Marland Kitchen Nonrheumatic mitral valve regurgitation   . Osteoarthritis of right hip    Scheduled for surgery 05/11/15 but he then declined to get the surgery.  . Tobacco abuse    Quit 2014  . Type II diabetes mellitus ([place]) 07/2016   Dx'd by two fasting glucoses > 126 (Hb a1c  6.2% at that time).      Past Surgical History:  Procedure Laterality Date  . CARDIAC CATHETERIZATION  2015   Normal coronaries  . CARDIOVERSION     DCCV 2015  . TONSILLECTOMY    . TRANSTHORACIC ECHOCARDIOGRAM  06/08/2018   (in context of acute CVA): EF 35-40%, mild/mod MR, mod/sev TR, severely dilated LA.     reports that he quit smoking about 6 years ago. His smoking use included cigarettes. He has a 5.00 pack-year smoking history. He has never used smokeless tobacco. He reports current alcohol use. He reports that he  does not use drugs.  No Known Allergies  Family History  Problem Relation Age of Onset  . Diabetes Mother   . Congestive Heart Failure Mother   . Alcohol abuse Father   . Hypertension Father   . Stroke Father   . Heart disease Paternal Grandfather      Prior to Admission medications   Medication Sig Start Date End Date Taking? Authorizing Provider  allopurinol (ZYLOPRIM) 100 MG tablet TAKE TWO TABLETS BY MOUTH DAILY 06/05/19   McGowen, Adrian Blackwater, MD  atorvastatin (LIPITOR) 40 MG tablet Take 1 tablet (40 mg total) by mouth daily. 03/11/19   McGowen, Adrian Blackwater, MD  digoxin (LANOXIN) 0.125 MG tablet TAKE ONE TABLET BY MOUTH DAILY Patient taking differently: every other day.  03/29/19   McGowen, Adrian Blackwater, MD  folic acid (FOLVITE) 1 MG tablet 1 tab po qd 08/21/18   McGowen, Adrian Blackwater, MD  glucose blood (CONTOUR NEXT TEST) test strip Use to check blood sugar once daily. Patient not taking: Reported on 11/09/2018 10/07/16   Tammi Sou, MD  lisinopril (ZESTRIL) 40 MG tablet 1/2 tab po qd 08/07/18   McGowen, Adrian Blackwater, MD  metoprolol succinate (TOPROL-XL) 25 MG 24 hr tablet Take 25 mg by mouth daily. 03/21/19   [provider]  MICROLET LANCETS MISC Use to check blood sugar once daily Patient not taking: Reported on 11/09/2018 11/15/16   McGowen, Adrian Blackwater, MD  ondansetron (ZOFRAN) 4 MG tablet Take 1 tablet (4 mg total) by mouth every 8 (eight) hours as needed for nausea or vomiting. Patient not taking: Reported on 11/09/2018 06/19/18   Little Ishikawa, MD  pantoprazole (PROTONIX) 40 MG tablet TAKE ONE TABLET BY MOUTH TWICE A DAY 06/05/19   McGowen, Adrian Blackwater, MD  XARELTO 20 MG TABS tablet Take 1 tablet (20 mg total) by mouth daily with supper. 02/14/19   Tammi Sou, MD    Physical Exam: Vitals:   06/28/19 1612 06/28/19 1617  BP: (!) 160/93   Pulse: (!) 103   Resp: 16   Temp: 98.3 F (36.8 C)   TempSrc: Oral   SpO2: 100% 100%  Weight: 76.7 kg   Height: 5\' 10"  (1.778 m)      Constitutional: NAD, calm  Eyes: PERTLA, lids and conjunctivae normal ENMT: Mucous membranes are moist. Posterior pharynx clear of any exudate or lesions.   Neck: normal, supple, no masses, no thyromegaly Respiratory:  no wheezing, no crackles. No accessory muscle use.  Cardiovascular: Rate ~100 and irregularly irregular. No extremity edema.  Abdomen: No distension, no tenderness, soft. Bowel sounds active.  Musculoskeletal: no cyanosis. Clubbing noted. No joint deformity upper and lower extremities.   Skin: no significant rashes, lesions, ulcers. Warm, dry, well-perfused. Neurologic: PERRL, EOMI, mild dysarthria. Sensation to light touch intact. Strength 4-5/5 throughout RUE and RLE and 4/5 on left.  Psychiatric: Alert and oriented to  person, place, and situation. Pleasant and cooperative.    Labs and Imaging on Admission: I have personally reviewed following labs and imaging studies  CBC: Recent Labs  Lab 06/28/19 1627  WBC 10.5  HGB 13.2  HCT 42.0  MCV 94.8  PLT 157   Basic Metabolic Panel: Recent Labs  Lab 06/28/19 1627  NA 141  K 3.8  CL 102  CO2 31  GLUCOSE 103*  BUN 12  CREATININE 0.87  CALCIUM 8.9   GFR: Estimated Creatinine Clearance: 94.4 mL/min (by C-G formula based on SCr of 0.87 mg/dL). Liver Function Tests: No results for input(s): AST, ALT, ALKPHOS, BILITOT, PROT, ALBUMIN in the last 168 hours. No results for input(s): LIPASE, AMYLASE in the last 168 hours. No results for input(s): AMMONIA in the last 168 hours. Coagulation Profile: No results for input(s): INR, PROTIME in the last 168 hours. Cardiac Enzymes: No results for input(s): CKTOTAL, CKMB, CKMBINDEX, TROPONINI in the last 168 hours. BNP (last 3 results) No results for input(s): PROBNP in the last 8760 hours. HbA1C: No results for input(s): HGBA1C in the last 72 hours. CBG: No results for input(s): GLUCAP in the last 168 hours. Lipid Profile: No results for input(s): CHOL, HDL,  LDLCALC, TRIG, CHOLHDL, LDLDIRECT in the last 72 hours. Thyroid Function Tests: No results for input(s): TSH, T4TOTAL, FREET4, T3FREE, THYROIDAB in the last 72 hours. Anemia Panel: No results for input(s): VITAMINB12, FOLATE, FERRITIN, TIBC, IRON, RETICCTPCT in the last 72 hours. Urine analysis:    Component Value Date/Time   COLORURINE YELLOW 06/28/2019 2022   APPEARANCEUR CLEAR 06/28/2019 2022   LABSPEC 1.013 06/28/2019 2022   PHURINE 6.0 06/28/2019 2022   GLUCOSEU NEGATIVE 06/28/2019 2022   HGBUR NEGATIVE 06/28/2019 2022   BILIRUBINUR NEGATIVE 06/28/2019 2022   KETONESUR 20 (A) 06/28/2019 2022   PROTEINUR NEGATIVE 06/28/2019 2022   NITRITE NEGATIVE 06/28/2019 2022   LEUKOCYTESUR NEGATIVE 06/28/2019 2022   Sepsis Labs: @LABRCNTIP (procalcitonin:4,lacticidven:4) )No results found for this or any previous visit (from the past 240 hour(s)).   Radiological Exams on Admission: CT Head Wo Contrast  Result Date: 06/29/2019 CLINICAL DATA:  Weakness EXAM: CT HEAD WITHOUT CONTRAST TECHNIQUE: Contiguous axial images were obtained from the base of the skull through the vertex without intravenous contrast. COMPARISON:  February 15, 2019 FINDINGS: Brain: Area of encephalomalacia involving the right MCA territory. No new acute infarct or extra-axial collections. There is a stable left pons and left basal ganglia lacunar infarct. There is dilatation the ventricles and sulci consistent with age-related atrophy. Low-attenuation changes in the deep white matter consistent with small vessel ischemia. Normal gray-white differentiation. Ventricles are normal in size and contour. Vascular: No hyperdense vessel or unexpected calcification. Skull: The skull is intact. No fracture or focal lesion identified. Sinuses/Orbits: The visualized paranasal sinuses and mastoid air cells are clear. The orbits and globes intact. Other: None IMPRESSION: No acute intracranial abnormality. Prior right MCA territory infarct.  Findings consistent with age related atrophy and chronic small vessel ischemia Electronically Signed   By: Prudencio Pair M.D.   On: 06/29/2019 00:20   DG Chest Portable 1 View  Result Date: 06/29/2019 CLINICAL DATA:  Generalized weakness. EXAM: PORTABLE CHEST 1 VIEW COMPARISON:  06/10/2018 FINDINGS: Mild cardiomegaly. Normal mediastinal contours. No pulmonary edema or pleural fluid. No confluent airspace disease. No pneumothorax. No acute osseous abnormalities are seen. IMPRESSION: Mild cardiomegaly. No congestive failure or acute chest findings. Electronically Signed   By: Keith Rake M.D.   On: 06/29/2019 00:11  EKG: Independently reviewed. Atrial fibrillation, rate 79, QTc 424.   Assessment/Plan   1. General weakness  - Patient reports residual left-sided weakness, dysphagia, and dysarthria from his prior CVA and while he denies any new focal deficit, he presents with one day of generalized weakness  - Head CT is negative for acute findings, he is not hypotensive and there is no evidence for sepsis or blood-loss or ACS  - MRI brain was ordered by ED physician but cannot be safely performed due to metal in his left orbit; neurologist advised that CT perfusion study could be obtained  - Check digoxin level, TSH, CK, B12, and folate; follow-up CTA head and neck and CT perfusion scan and consult neurology if any acute findings; consult with PT for eval and treatment    2. Atrial fibrillation with RVR  - Patient has known a fib on Xarelto (CHADS-VASc at least 3 for HTN, CVA x2)  - HR went into 120s in ED and diltiazem infusion was ordered but rate decreased to 90s prior to starting it  - Continue cardiac monitoring, continue metoprolol, hold digoxin pending level, use diltiazem IVP if needed for recurrent RVR    3. Hypertension  - Continue metoprolol and lisinopril    4. History of CVA  - CT perfusion study and CTA head and neck pending as above  - Continue Lipitor and Xarelto     DVT prophylaxis: Continue Xarelto  Code Status: Full  Family Communication: Discussed with patient  Disposition Plan:  Patient is from: Home  Anticipated d/c is to: TBD Anticipated d/c date is: 6/20 Patient currently: Pending CTA head and neck, CT perfusion study, additional lab studies, and improvement in his weakness  Consults called: None  Admission status: Observation     Vianne Bulls, MD [facility] Pager: See [email or web address]  If 7AM-7PM, please contact the daytime attending [email or web address]  06/29/2019, 4:11 AM

## 2019-06-29 NOTE — Progress Notes (Signed)
Pt up to bathroom 1 assist  with cane. At baseline.

## 2019-07-01 ENCOUNTER — Telehealth: Payer: Self-pay

## 2019-07-01 NOTE — Telephone Encounter (Signed)
Noted: nurse phone contact with patient for TCM. Signed:  Crissie Sickles, MD           07/01/2019

## 2019-07-01 NOTE — Telephone Encounter (Signed)
Spoke with patients wife, Robert [name].   Transition Care Management Follow-up Telephone Call  Admission: 06/28/2019-06/29/2019 Diagnosis: General weakness   How have you been since you were released from the hospital? "He's back to his old self"   Do you understand why you were in the hospital? yes   Do you understand the discharge instructions? yes   Where were you discharged to? Home. Resides with wife.    Items Reviewed:  Medications reviewed: yes  Allergies reviewed: yes  Dietary changes reviewed: yes  Referrals reviewed: yes   Functional Questionnaire:   Activities of Daily Living (ADLs):   He states they are independent in the following: ambulation, bathing and hygiene, feeding, continence, grooming, toileting and dressing States they require assistance with the following: None.    Any transportation issues/concerns?: no   Any patient concerns? Yes. Patient COVID positive. Would like to know when COVID vaccine can be received.    Confirmed importance and date/time of follow-up visits scheduled yes  Provider Appointment booked with PCP, 07/16/2019. In person (no smart phone or computer), past 14 day quarantine.   Confirmed with patient if condition begins to worsen call PCP or go to the ER.  Patient was given the office number and encouraged to call back with question or concerns.  : yes

## 2019-07-16 ENCOUNTER — Encounter: Payer: Self-pay | Admitting: Family Medicine

## 2019-07-16 ENCOUNTER — Ambulatory Visit (INDEPENDENT_AMBULATORY_CARE_PROVIDER_SITE_OTHER): Payer: Medicare HMO | Admitting: Family Medicine

## 2019-07-16 ENCOUNTER — Other Ambulatory Visit: Payer: Self-pay

## 2019-07-16 VITALS — BP 118/71 | HR 71 | Temp 98.4°F | Resp 16 | Ht 65.5 in | Wt 171.2 lb

## 2019-07-16 DIAGNOSIS — I6930 Unspecified sequelae of cerebral infarction: Secondary | ICD-10-CM | POA: Diagnosis not present

## 2019-07-16 DIAGNOSIS — R531 Weakness: Secondary | ICD-10-CM | POA: Diagnosis not present

## 2019-07-16 DIAGNOSIS — I482 Chronic atrial fibrillation, unspecified: Secondary | ICD-10-CM | POA: Diagnosis not present

## 2019-07-16 DIAGNOSIS — U071 COVID-19: Secondary | ICD-10-CM

## 2019-07-16 NOTE — Progress Notes (Signed)
07/16/2019  CC:  Chief Complaint  Patient presents with  . Hospitalization Follow-up    Patient is a 59 y.o. Caucasian male who presents for  hospital follow up. Dates hospitalized: 6/18-6/19, 2021. Days since d/c from hospital: 17 Patient was discharged from hospital to home.  He was asked to f/u with neurology as outpt Reason for admission to hospital: generalized weakness.  He denied any changes in his chronic neurologic residual sx's from remote R MCA territory CVA, no new focal neurologic sx's.  No CP. No fevers, cough, SOB, or recent falls/trauma. Date of interactive (phone) contact with patient and/or caregiver:07/01/19  I have reviewed patient's discharge summary plus pertinent specific notes. Wife assists with hx today--she states that on the day of admission-->after pt had been sitting outside in the heat for a while he had gone in and taken a nap, which is not unusual for him, then wife says he woke up and was very weak and pale.  Told wife he was too week to get out of bed. Wife called 911.  He told EMS that his heart was beating fast and he was sweating a lot. Waited a long time in ED before being seen.  Was in a-fib with RVR. Says he did get an injection in arm but not sure if med or if this is just IVF he's referring to.  Hosp d/c summary states his rate calmed down to <100 PRIOR to diltiazem injection so the med was not given. He has felt completely back to baseline health since going home from hospital. Good appetite/PO intake. Most of the time ambulates with cane, sometimes uses a walker. Never walks unassisted.  ROS: no fevers, no CP, no SOB, no wheezing, no cough, no dizziness, no HAs, no rashes, no melena/hematochezia.  No polyuria or polydipsia.  No myalgias or arthralgias.  No NEW focal weakness, paresthesias, or tremors.  No acute vision or hearing abnormalities. No n/v/d or abd pain.  No palpitations.     Labs, and imaging from the hospitalization.  Pt admitted for  obs, all labs normal except incidentally covid 19 positive. He was continued on xarelto, dig, atorva, lisin, folic acid, toprol, and protonix. His dig dosing was changed to qod instead of qd about 2-3 mo ago by his cardiologist. No new meds upon d/c home.  06/29/19 imaging: CT angio head (perfusion study): no acute findings. CT head noncontrast: neg acute Portable CXR: mild cardiomegaly, no acute findings.    Medication reconciliation was done today and patient is taking meds as recommended by discharging hospitalist/specialist.    PMH:  Past Medical History:  Diagnosis Date  . Alcohol abuse    ongoing as of 07/2017.  Quit after CVA 05/2018  . Arthritis   . Atrial fibrillation ([place])    Not candidate for anticoagulation b/c of GI bleeding; had to get transfused.  Then, pt had CVA 05/2018 and he was put on xarelto and he quit drinking.  . CVA (cerebral vascular accident) ([place]) 05/2018   large R MCA territory infarct.  CTA neg for large cerebral vessel occlusion.  R ICA 70% stenosis, L clear. Rpt CTA neck ordered by neuro as of 01/2019.  Marland Kitchen Gastric ulcer    hx of--unclear (pt and wife deny this)  . Gout    Usually MTP joint  . History of digital clubbing    "all my adult life"--CXR 08/2016 = bronchitic changes o/w normal.  . Hyperkalemia 04/2017   Normalized with decreasing lisinopril from 40 mg  qd to 20 mg qd.  . Hyperlipidemia 07/2016   Holding off on statin until pt quits drinking.  Back on statin after CVA 05/2018  . Hypertension   . Nonischemic cardiomyopathy ([place]) 2015; 2020   2015: "resolved" (w/medical therapy?).  05/2018 EF 35-40%, with TR and MR and ++LAE.  Marland Kitchen Nonrheumatic mitral valve regurgitation   . Osteoarthritis of right hip    Scheduled for surgery 05/11/15 but he then declined to get the surgery.  . Tobacco abuse    Quit 2014  . Type II diabetes mellitus ([place]) 07/2016   Dx'd by two fasting glucoses > 126 (Hb a1c 6.2% at that time).      PSH:  Past Surgical History:   Procedure Laterality Date  . CARDIAC CATHETERIZATION  2015   Normal coronaries  . CARDIOVERSION     DCCV 2015  . TONSILLECTOMY    . TRANSTHORACIC ECHOCARDIOGRAM  06/08/2018   (in context of acute CVA): EF 35-40%, mild/mod MR, mod/sev TR, severely dilated LA.    MEDS:  Outpatient Medications Prior to Visit  Medication Sig Dispense Refill  . allopurinol (ZYLOPRIM) 100 MG tablet TAKE TWO TABLETS BY MOUTH DAILY (Patient taking differently: Take 100 mg by mouth 2 (two) times daily. ) 60 tablet 2  . atorvastatin (LIPITOR) 40 MG tablet Take 1 tablet (40 mg total) by mouth daily. 90 tablet 3  . digoxin (LANOXIN) 0.125 MG tablet TAKE ONE TABLET BY MOUTH DAILY (Patient taking differently: Take 0.125 mg by mouth every other day. ) 30 tablet 3  . folic acid (FOLVITE) 1 MG tablet 1 tab po qd (Patient taking differently: Take 1 mg by mouth daily. 1 tab po qd) 90 tablet 3  . lisinopril (ZESTRIL) 20 MG tablet Take 1 tablet (20 mg total) by mouth daily. 30 tablet 4  . metoprolol succinate (TOPROL-XL) 25 MG 24 hr tablet Take 25 mg by mouth daily.    . pantoprazole (PROTONIX) 40 MG tablet TAKE ONE TABLET BY MOUTH TWICE A DAY (Patient taking differently: Take 40 mg by mouth 2 (two) times daily. ) 60 tablet 5  . XARELTO 20 MG TABS tablet Take 1 tablet (20 mg total) by mouth daily with supper. 90 tablet 3   No facility-administered medications prior to visit.   EXAM:  Vitals with BMI 07/16/2019 06/29/2019 06/29/2019  Height 5' 5.5" - -  Weight 171 lbs 3 oz - -  BMI 73.22 - -  Systolic 025 427 062  Diastolic 71 80 80  Pulse 71 - 65  O2 sat on RA today is 99%  Gen: Alert, well appearing.  Patient is oriented to person, place, time, and situation. AFFECT: pleasant, lucid thought and speech. BJS:EGBT: no injection, icteris, swelling, or exudate.  EOMI, PERRLA. Mouth: lips without lesion/swelling.  Oral mucosa pink and moist. Oropharynx without erythema, exudate, or swelling.  CV: irreg irreg, rate approx  75, no m/r. Chest is clear, no wheezing or rales. Normal symmetric air entry throughout both lung fields. No chest wall deformities or tenderness. Neuro: L hand grip essentially 5/5, symmetric.  No tremor. Left leg strength 4/5.  Ability to lift R leg signif impaired due to severe R hip DJD/pain/impaired ROM. No face droop.  Tongue midline.  EOMI, PERL.  Pertinent labs/imaging  06/29/19 COVID 19 nasopharyngial nucleic acid test POS. Lab Results  Component Value Date   CKTOTAL 132 06/29/2019   Lab Results  Component Value Date   FOLATE 33.7 06/29/2019   Lab Results  Component  Value Date   VITAMINB12 576 06/29/2019    Lab Results  Component Value Date   DIGOXIN 0.4 (L) 06/29/2019    Lab Results  Component Value Date   TSH 1.38 05/09/2019   Lab Results  Component Value Date   WBC 10.5 06/28/2019   HGB 13.2 06/28/2019   HCT 42.0 06/28/2019   MCV 94.8 06/28/2019   PLT 201 06/28/2019   Lab Results  Component Value Date   CREATININE 0.87 06/28/2019   BUN 12 06/28/2019   NA 141 06/28/2019   K 3.8 06/28/2019   CL 102 06/28/2019   CO2 31 06/28/2019   Lab Results  Component Value Date   ALT 20 06/29/2019   AST 20 06/29/2019   ALKPHOS 81 06/29/2019   BILITOT 1.2 06/29/2019   Lab Results  Component Value Date   CHOL 133 05/09/2019   Lab Results  Component Value Date   HDL 56.20 05/09/2019   Lab Results  Component Value Date   LDLCALC 68 05/09/2019   Lab Results  Component Value Date   TRIG 43.0 05/09/2019   Lab Results  Component Value Date   CHOLHDL 2 05/09/2019   Lab Results  Component Value Date   PSA 1.70 05/09/2019   PSA 1.90 07/31/2017   PSA 2.08 08/01/2016   Lab Results  Component Value Date   HGBA1C 5.9 05/09/2019    ASSESSMENT/PLAN:  1) Acute generalized weakness: suspect he had mild heat-induced illness that triggered RVR and this caused his severe weakness, diaphoresis, and pallor. Covid POS in hosp but we'll never know if covid  was truly a part of this whole scenario or not b/c he had NONE of the typical sx's of covid illness. His digoxin level was a little low in hosp but can't tell how close to his last dig dose this level was checked.  His dig dosing was changed to QOD dosing from QD dosing 03/21/19 by [facility] cardiology, and he is to go to their lab to get dig level check again any time now. Also, low dose toprol was added at that time.  He is in his normal state of health since d/c home and I made no changes today and did no new testing.  FOLLOW UP:  2 mo, [facility] Next CPE 04/2020.  Signed:  Crissie Sickles, MD           07/16/2019

## 2019-07-27 ENCOUNTER — Other Ambulatory Visit: Payer: Self-pay | Admitting: Family Medicine

## 2019-07-31 ENCOUNTER — Encounter: Payer: Self-pay | Admitting: Adult Health

## 2019-07-31 ENCOUNTER — Ambulatory Visit (INDEPENDENT_AMBULATORY_CARE_PROVIDER_SITE_OTHER): Payer: MEDICARE | Admitting: Adult Health

## 2019-07-31 ENCOUNTER — Other Ambulatory Visit: Payer: Self-pay

## 2019-07-31 VITALS — BP 140/82 | HR 88 | Ht 70.0 in | Wt 170.0 lb

## 2019-07-31 DIAGNOSIS — E785 Hyperlipidemia, unspecified: Secondary | ICD-10-CM | POA: Diagnosis not present

## 2019-07-31 DIAGNOSIS — I4821 Permanent atrial fibrillation: Secondary | ICD-10-CM

## 2019-07-31 DIAGNOSIS — I10 Essential (primary) hypertension: Secondary | ICD-10-CM | POA: Diagnosis not present

## 2019-07-31 DIAGNOSIS — I63411 Cerebral infarction due to embolism of right middle cerebral artery: Secondary | ICD-10-CM | POA: Diagnosis not present

## 2019-07-31 DIAGNOSIS — I6521 Occlusion and stenosis of right carotid artery: Secondary | ICD-10-CM

## 2019-07-31 NOTE — Progress Notes (Signed)
[facility] [address] [place]. [place] [address] [phone]       STROKE FOLLOW UP NOTE  [name] Date of Birth:  1960-04-16 Medical Record Number:  [id number]   Reason for Referral: stroke follow up    CHIEF COMPLAINT:  Chief Complaint  Patient presents with  . Follow-up    rm 5 here for a stroke f/u. Pt is having no new sx    HPI:  Today, 07/31/2019, [name] returns for stroke follow up accompanied by his wife.  Residual deficits of dysarthria and dysphagia which has been stable without worsening.  He was recently seen in ED on 06/28/2019 for generalized weakness complaints along with SOB, diaphoretic, heart racing with evidence of A. fib with RVR and COVID-19 positive.  RVR resolved prior to intervention.  No focal neurological deficits reported and CT head, CT perfusion and CTA head/neck unremarkable for acute abnormality.  He has been stable since that time he denies new or worsening stroke/TIA symptoms.  Apparently prior to symptom onset, he was sitting in the sun for a long duration.  Remains on Xarelto and atorvastatin without side effects.  Blood pressure today 140/82.  Continues to experience right hip pain which limits daily activity and ambulation.  No further concerns at this time.     History provided for purposes only Update 01/28/2019 JM: [name] is a 59 year old male being seen today for stroke follow up accompanied by his wife.  Residual stroke deficits of subjective dysphagia and occasional word finding difficulty but does endorse ongoing improvement.  He has also been having increased difficulty with right hip pain which has been limiting functional activity.  He was evaluated by home therapies with improvement of ambulation and no longer requires assistive device.  Per wife, swallowing satisfactory and no evidence of dysphagia.  He reports occasional difficulty with coughing on his saliva but no difficulty with thin liquids or  foods.  He continues on Xarelto and atorvastatin for secondary stroke prevention with side effects.  Blood pressure today satisfactory 126/83.  Denies new or worsening stroke/TIA symptoms.  Initial visit 08/15/2018 JM: He has since been discharged home with residual dysarthria, word finding difficulty and dysphasia.  Home OT evaluated but discontinued as it was felt as though he would benefit more from PT/ST which will start this Friday.  Currently ambulating with rolling walker due to dizziness/balance difficulties but denies any falls.  He has been experiencing increased coughing while eating.  He is eating regular food at home but wife does ensure pieces are caught up small.  He has remained on nectar thick liquid which is typically when he will start coughing.  Wife states he uses a coffee stirring straw to ensure he does not drink too much at once.  He has been having blood pressure medications adjusted by PCP due to potential orthostatic dizziness and hypotension likely secondary to weight loss since stroke and decreased cardiac output.  He does endorse improvement of blood pressures with medication changes and decreased dizziness episodes.  He has completely quit drinking alcohol since discharge.  Currently on Xarelto without bleeding or bruising (discharged on Eliquis after hospitalization - ?  Change in medication during SNF).  Continues on atorvastatin without myalgias.  Denies new or worsening stroke/TIA symptoms.  Stroke admission 06/06/2018: [name] is a 59 y.o. male with history of Etoh Abuse, Afib not on [place] d/t GIB, DM, peptic ulcer, hypertension, hyperlipidemia, gout who presented with AMS and  dysarthria. Found to have large subacute RMCA infarct secondary to A. fib not on AC versus right ICA high-grade stenosis with resultant severe dysarthria.  MRI unable to be performed due to metal insult.  Vessel imaging showed 70% proximal right ICA stenosis and occlusion of right vertebral artery at  its origin.  2D echo showed an EF of 35 to 40%.  LDL 103 and A1c 5.5.  It was recommended to initiate Eliquis due to atrial fibrillation and recent stroke.  Initiated atorvastatin 40 mg daily for HLD management.  Current tobacco use of smoking cessation counseling provided.  Repeat CT head on 06/07/2018 showed acute hypoxic decompensation and respiratory distress ultimately needing intubation secondary to aspiration pneumonia versus pneumonitis with successful extubation 06/09/2018.  Initially failed swallow eval with need of NG tube but was able to be discharged on modified diet.  Due to residual deficits, he was discharged to SNF for ongoing therapies.  Stroke: R LARGE MCA infarct due to A. fib not on AC vs. right ICA high-grade stenosis  Resultant severe dysarthria  CT head R LARGE MCA stroke  MRI head - not able to performed due to metal in skull  CTA H&N - 70% stenosis proximal right ICA.  Occlusion of the right vertebral artery at its origin.   2D Echo - EF 35 to 40%.  No wall motion abnormalities.  EEG - moderate global slowing indicating a moderate global encephalopathy  [employer] Virus 2  - negative  LDL - 103  HgbA1c - 5.5  UDS - negative  VTE prophylaxis - lovenox  None prior to admission, now on ASA 325mg . Pt agrees with DOAC after discussion with Dr. Debara Pickett. Will recommend eliquis on 06/16/18 to avoid hemorrhagic conversion.   Ongoing aggressive stroke risk factor management  Therapy recommendations:  SNF  Disposition:  SNF    ROS:   14 system review of systems performed and negative with exception of speech and swallowing difficulty and pain  PMH:  Past Medical History:  Diagnosis Date  . Alcohol abuse    ongoing as of 07/2017.  Quit after CVA 05/2018  . Arthritis   . Atrial fibrillation ([place])    Not candidate for anticoagulation b/c of GI bleeding; had to get transfused.  Then, pt had CVA 05/2018 and he was put on xarelto and he quit drinking.  . CVA (cerebral  vascular accident) ([place]) 05/2018   large R MCA territory infarct.  CTA neg for large cerebral vessel occlusion.  R ICA 70% stenosis, L clear. Rpt CTA neck ordered by neuro as of 01/2019.  Marland Kitchen Gastric ulcer    hx of--unclear (pt and wife deny this)  . Gout    Usually MTP joint  . History of digital clubbing    "all my adult life"--CXR 08/2016 = bronchitic changes o/w normal.  . Hyperkalemia 04/2017   Normalized with decreasing lisinopril from 40 mg qd to 20 mg qd.  . Hyperlipidemia 07/2016   Holding off on statin until pt quits drinking.  Back on statin after CVA 05/2018  . Hypertension   . Nonischemic cardiomyopathy ([place]) 2015; 2020   2015: "resolved" (w/medical therapy?).  05/2018 EF 35-40%, with TR and MR and ++LAE.  Marland Kitchen Nonrheumatic mitral valve regurgitation   . Osteoarthritis of right hip    Scheduled for surgery 05/11/15 but he then declined to get the surgery.  . Tobacco abuse    Quit 2014  . Type II diabetes mellitus ([place]) 07/2016   Dx'd by two  fasting glucoses > 126 (Hb a1c 6.2% at that time).      PSH:  Past Surgical History:  Procedure Laterality Date  . CARDIAC CATHETERIZATION  2015   Normal coronaries  . CARDIOVERSION     DCCV 2015  . TONSILLECTOMY    . TRANSTHORACIC ECHOCARDIOGRAM  06/08/2018   (in context of acute CVA): EF 35-40%, mild/mod MR, mod/sev TR, severely dilated LA.    Social History:  Social History   Socioeconomic History  . Marital status: Married    Spouse name: Not on file  . Number of children: Not on file  . Years of education: Not on file  . Highest education level: Not on file  Occupational History  . Not on file  Tobacco Use  . Smoking status: Former Smoker    Packs/day: 1.00    Years: 5.00    Pack years: 5.00    Types: Cigarettes    Quit date: 01/10/2013    Years since quitting: 6.5  . Smokeless tobacco: Never Used  Vaping Use  . Vaping Use: Former  Substance and Sexual Activity  . Alcohol use: Yes    Comment: drinks liquor daily  .  Drug use: No  . Sexual activity: Not on file  Other Topics Concern  . Not on file  Social History Narrative   Married, 2 adult children.   Educ: 11th grade   Occup: "out of work".  Worked for [employer]--was let go for not abiding by [employer] per pt/wife.   Tob: former--quit 2014.  "Of and on prior".   Alc: long hx of drinking a pint and 1 and 1/2 pint of whiskey per day x years---quit approx May 2020.Marland Kitchen   Drugs: none.   Social Determinants of Health   Financial Resource Strain:   . Difficulty of Paying Living Expenses:   Food Insecurity:   . Worried About Charity fundraiser in the Last Year:   . Arboriculturist in the Last Year:   Transportation Needs:   . Film/video editor (Medical):   Marland Kitchen Lack of Transportation (Non-Medical):   Physical Activity:   . Days of Exercise per Week:   . Minutes of Exercise per Session:   Stress:   . Feeling of Stress :   Social Connections:   . Frequency of Communication with Friends and Family:   . Frequency of Social Gatherings with Friends and Family:   . Attends Religious Services:   . Active Member of Clubs or Organizations:   . Attends Archivist Meetings:   Marland Kitchen Marital Status:   Intimate Partner Violence:   . Fear of Current or Ex-Partner:   . Emotionally Abused:   Marland Kitchen Physically Abused:   . Sexually Abused:     Family History:  Family History  Problem Relation Age of Onset  . Diabetes Mother   . Congestive Heart Failure Mother   . Alcohol abuse Father   . Hypertension Father   . Stroke Father   . Heart disease Paternal Grandfather     Medications:   Current Outpatient Medications on File Prior to Visit  Medication Sig Dispense Refill  . allopurinol (ZYLOPRIM) 100 MG tablet TAKE TWO TABLETS BY MOUTH DAILY (Patient taking differently: Take 100 mg by mouth 2 (two) times daily. ) 60 tablet 2  . atorvastatin (LIPITOR) 40 MG tablet Take 1 tablet (40 mg total) by mouth daily. 90 tablet 3  . digoxin (LANOXIN)  0.125 MG tablet TAKE ONE TABLET  BY MOUTH DAILY 30 tablet 2  . folic acid (FOLVITE) 1 MG tablet 1 tab po qd (Patient taking differently: Take 1 mg by mouth daily. 1 tab po qd) 90 tablet 3  . lisinopril (ZESTRIL) 20 MG tablet Take 1 tablet (20 mg total) by mouth daily. 30 tablet 4  . metoprolol succinate (TOPROL-XL) 25 MG 24 hr tablet Take 25 mg by mouth daily.    . pantoprazole (PROTONIX) 40 MG tablet TAKE ONE TABLET BY MOUTH TWICE A DAY (Patient taking differently: Take 40 mg by mouth 2 (two) times daily. ) 60 tablet 5  . XARELTO 20 MG TABS tablet Take 1 tablet (20 mg total) by mouth daily with supper. 90 tablet 3   No current facility-administered medications on file prior to visit.    Allergies:  No Known Allergies   Physical Exam  Vitals:   07/31/19 1031  BP: 140/82  Pulse: 88  Weight: 170 lb (77.1 kg)  Height: 5\' 10"  (1.778 m)   Body mass index is 24.39 kg/m. No exam data present  General: Frail middle-aged but elderly-appearing Caucasian male, seated, in no evident distress Head: head normocephalic and atraumatic.   Neck: supple with no carotid or supraclavicular bruits Cardiovascular: irregular rate and rhythm, no murmurs Musculoskeletal: no deformity; limited right hip ROM due to pain Skin:  no rash/petichiae Vascular:  Normal pulses all extremities   Neurologic Exam Mental Status: Awake and fully alert.   Mild to moderate dysarthria.  Oriented to place and time. Recent and remote memory intact. Attention span, concentration and fund of knowledge slightly diminished with wife helping to provide some information. Mood and affect appropriate.  Cranial Nerves: Pupils equal, briskly reactive to light. Extraocular movements full without nystagmus. Visual fields full to confrontation. Hearing intact. Facial sensation intact.  Mild left lower facial paralysis, tongue, palate moves normally and symmetrically.  Motor: Normal bulk and tone.  Equal and normal strength tested on all  extremities except difficulty testing RLE due to hip pain Sensory.: intact to touch , pinprick , position and vibratory sensation.  Coordination: Rapid alternating movements normal in all extremities. Finger-to-nose performed accurately bilaterally and heel-to-shin performed accurately on left side but unable to perform on right side due to hip pain. Gait and Station: Arises from chair with difficulty. Stance is normal. Gait demonstrates normal stride length with favoring of right leg due to hip pain and use of rolling walker Reflexes: 1+ and symmetric. Toes downgoing.      ASSESSMENT: Robert [name] is a 59 y.o. year old male presented with altered mental status and dysarthria with findings of right large MCA infarct on 06/06/2018 secondary to likely atrial fibrillation not on AC. Vascular risk factors include right ICA high-grade stenosis, EtOH use, A. fib not on AC, HTN, HLD and tobacco use.      PLAN:  1. Right MCA infarct:  -Residual deficits: Dysphagia and dysarthria.  Remains on regular diet but has occasional difficulty with saliva, hard foods or large sips of liquids.  Compensates appropriately.   -Continue Xarelto (rivaroxaban) daily  and atorvastatin for secondary stroke prevention.   -Close PCP follow-up for aggressive stroke risk factor management 2. Right ICA stenosis: Recent CTA right ICA 50% stenosis (prior 70%) and left ICA mild arthrosclerotic plaque; chronic occlusion proximal right vertebral artery.  Continue aggressive stroke risk factor management.  Prior CTA 06/2019.  Plan on repeating carotid ultrasound in 6 months for surveillance monitoring 3. Atrial fibrillation: Continue Xarelto and ongoing follow-up by cardiology  for management and monitoring 4. HTN: BP goal<130/90.  Stable today.  F/u with PCP 5. HLD: LDL goal<70.  Recent LDL 68.  Continue atorvastatin and ongoing follow-up with PCP for monitoring of lipid panel, prescribing of statin and ongoing monitoring and  management 6. Generalized weakness episode: Likely in setting of A. fib in RVR possibly occurring due to prolonged duration of sitting in heat and potentially with COVID-19 positive testing although asymptomatic to typical symptoms.  Encouraged to avoid sitting in direct sun for prolonged periods of time and to ensure adequate hydration and fluid intake.  Denies reoccurring signs or symptoms   Follow up in 6 months or call earlier if needed   I spent 30 minutes of face-to-face and non-face-to-face time with patient and wife.  This included previsit chart review, lab review, study review, order entry, electronic health record documentation, patient education regarding history of stroke, residual deficits, importance of managing stroke risk factors and answered all questions to patient and wife satisfaction   Frann Rider, AGNP-BC  [facility] [address] [place], [place] [address]  Phone [phone] Fax [phone] Note: This document was prepared with digital dictation and possible smart phrase technology. Any transcriptional errors that result from this process are unintentional.

## 2019-07-31 NOTE — Patient Instructions (Signed)
Continue Xarelto (rivaroxaban) daily  and atorvastatin for secondary stroke prevention  Continue to follow up with PCP regarding cholesterol and blood pressure management  Continue to follow with cardiology for atrial fibrillation and Xarelto management  Continue to monitor blood pressure at home  Maintain strict control of hypertension with blood pressure goal below 130/90, diabetes with hemoglobin A1c goal below 6.5% and cholesterol with LDL cholesterol (bad cholesterol) goal below 70 mg/dL. I also advised the patient to eat a healthy diet with plenty of whole grains, cereals, fruits and vegetables, exercise regularly and maintain ideal body weight.  Followup in the future with me in 6 months or call earlier if needed       Thank you for coming to see [place] at [facility]. I hope we have been able to provide you high quality care today.  You may receive a patient satisfaction survey over the next few weeks. We would appreciate your feedback and comments so that we may continue to improve ourselves and the health of our patients.

## 2019-08-02 ENCOUNTER — Ambulatory Visit: Payer: 59 | Admitting: Family Medicine

## 2019-08-09 DIAGNOSIS — Z1211 Encounter for screening for malignant neoplasm of colon: Secondary | ICD-10-CM | POA: Diagnosis not present

## 2019-08-09 DIAGNOSIS — D125 Benign neoplasm of sigmoid colon: Secondary | ICD-10-CM | POA: Diagnosis not present

## 2019-08-09 HISTORY — PX: COLONOSCOPY: SHX174

## 2019-08-09 LAB — HM COLONOSCOPY

## 2019-08-13 ENCOUNTER — Other Ambulatory Visit: Payer: Self-pay

## 2019-08-13 MED ORDER — FOLIC ACID 1 MG PO TABS: ORAL_TABLET | ORAL | 1 refills | Status: DC

## 2019-09-11 ENCOUNTER — Telehealth: Payer: Self-pay

## 2019-09-11 MED ORDER — ALLOPURINOL 100 MG PO TABS: 200.0000 mg | ORAL_TABLET | Freq: Every day | ORAL | 3 refills | Status: DC

## 2019-09-11 NOTE — Telephone Encounter (Signed)
Patient requests 90 day supply of allopurinol.

## 2019-09-11 NOTE — Telephone Encounter (Signed)
Pt has appt on 09/13/19, Rx can be filled at that time

## 2019-09-11 NOTE — Telephone Encounter (Signed)
I went ahead and filled it

## 2019-09-13 ENCOUNTER — Ambulatory Visit: Payer: Medicare HMO | Admitting: Family Medicine

## 2019-09-19 ENCOUNTER — Encounter: Payer: Self-pay | Admitting: Family Medicine

## 2019-09-21 ENCOUNTER — Encounter: Payer: Self-pay | Admitting: Family Medicine

## 2019-09-26 ENCOUNTER — Other Ambulatory Visit: Payer: Self-pay

## 2019-09-26 ENCOUNTER — Encounter: Payer: Self-pay | Admitting: Family Medicine

## 2019-09-26 ENCOUNTER — Ambulatory Visit (INDEPENDENT_AMBULATORY_CARE_PROVIDER_SITE_OTHER): Payer: Medicare HMO | Admitting: Family Medicine

## 2019-09-26 VITALS — BP 123/77 | HR 65 | Temp 97.9°F | Resp 16 | Wt 173.4 lb

## 2019-09-26 DIAGNOSIS — E1149 Type 2 diabetes mellitus with other diabetic neurological complication: Secondary | ICD-10-CM

## 2019-09-26 DIAGNOSIS — I10 Essential (primary) hypertension: Secondary | ICD-10-CM | POA: Diagnosis not present

## 2019-09-26 DIAGNOSIS — I482 Chronic atrial fibrillation, unspecified: Secondary | ICD-10-CM

## 2019-09-26 DIAGNOSIS — Z7901 Long term (current) use of anticoagulants: Secondary | ICD-10-CM

## 2019-09-26 DIAGNOSIS — G8929 Other chronic pain: Secondary | ICD-10-CM

## 2019-09-26 DIAGNOSIS — E7800 Pure hypercholesterolemia, unspecified: Secondary | ICD-10-CM | POA: Diagnosis not present

## 2019-09-26 DIAGNOSIS — Z23 Encounter for immunization: Secondary | ICD-10-CM | POA: Diagnosis not present

## 2019-09-26 DIAGNOSIS — M1611 Unilateral primary osteoarthritis, right hip: Secondary | ICD-10-CM | POA: Diagnosis not present

## 2019-09-26 DIAGNOSIS — M25551 Pain in right hip: Secondary | ICD-10-CM

## 2019-09-26 DIAGNOSIS — Z8673 Personal history of transient ischemic attack (TIA), and cerebral infarction without residual deficits: Secondary | ICD-10-CM

## 2019-09-26 DIAGNOSIS — Z79899 Other long term (current) drug therapy: Secondary | ICD-10-CM

## 2019-09-26 MED ORDER — ATORVASTATIN CALCIUM 40 MG PO TABS: 40.0000 mg | ORAL_TABLET | Freq: Every day | ORAL | 1 refills | Status: DC

## 2019-09-26 MED ORDER — PANTOPRAZOLE SODIUM 40 MG PO TBEC: 40.0000 mg | DELAYED_RELEASE_TABLET | Freq: Two times a day (BID) | ORAL | 1 refills | Status: DC

## 2019-09-26 MED ORDER — METOPROLOL SUCCINATE ER 25 MG PO TB24: 25.0000 mg | ORAL_TABLET | Freq: Every day | ORAL | 1 refills | Status: DC

## 2019-09-26 MED ORDER — LISINOPRIL 20 MG PO TABS: 20.0000 mg | ORAL_TABLET | Freq: Every day | ORAL | 1 refills | Status: DC

## 2019-09-26 NOTE — Progress Notes (Signed)
OFFICE VISIT  09/26/2019  CC:  Chief Complaint  Patient presents with  . Follow-up    [facility]   HPI:    Patient is a 59 y.o. Caucasian male with a history of alcoholism who presents for 5 month f/u hx of large R MCA territory CVA, chronic a-fib, DM 2, HTN, HLD. He also has nonischemic CM and MVR, followed by [facility] cardiologist. A/P as of last visit: "1) HTN: well controlled w/out any symptoms of low bp's. Continue lisin and metop. Lytes/cr today.  2) HLD: tolerating statin. FLP and hepatic panel today.  3) Hx of CVA: repeat CT angio neck and head showed 50% R ICA stenosis, otherwise chronic right vertebral artery occlusion with distal segment reconstitution and R PICA filling. Multiple remote infarcts which have not progressed since 05/2018 CT. Followed by neurologist.  4) DM: no home gluc monitoring.  Well controlled on no meds historically. HbA1c and fasting glucose today.  5) Constipation, chronic since CVA and stopping alcohol.  Likely due to inactivity. Instructions: buy metamucil fiber supplement and take one dose daily (directions on packaging). Also, buy over the counter senakot-S (generic is fine), 2 tabs every evening. If no BM in 4 days or so, take one capful of miralax powder (OTC, generic) 1-2 times per day until adequate BM.  6) Health maintenance exam: Reviewed age and gender appropriate health maintenance issues (prudent diet, regular exercise, health risks of tobacco and excessive alcohol, use of seatbelts, fire alarms in home, use of sunscreen).  Also reviewed age and gender appropriate health screening as well as vaccine recommendations. Vaccines: UTD including shingrix. Encouraged him to get covid vaccine. Labs: fasting HP+HbA1c (DM), hep c ab, and PSA. Prostate ca screening: DRE deferred by pt today, PSA. Colon ca screening: has never had colon ca screening-->ref GI today."  INTERIM HX: Feeling well, only c/o is chronic severe R hip pain.  DM: no home  gluc monitoring.  Eats whatever he wants.  Drinks sweet tea (about 25 oz/day) but no soft drinks. Drinks about 25 oz water/day Tries to get out and walk with cane but sometimes with walker.  HTN: no home bp monitoring.    HLD: tolerating statin fine.  A-fib: denies palpitations, racing heart, or dizziness. He still has not gotten the dig level that his cardiologist had ordered after his hospitalization for weakness back in early July.  ROS: no fevers, no CP, no SOB, no wheezing, no cough, no dizziness, no HAs, no rashes, no melena/hematochezia.  No polyuria or polydipsia.  No myalgias or arthralgias.  No focal weakness, paresthesias, or tremors.  No acute vision or hearing abnormalities. No n/v/d or abd pain.  No palpitations.   Has severe R hip pain from osteoarthritis, very limited ROM.  Takes tylenol and this helps some. He can't take opioids b/c of hx of alcoholism/risk of addiction.  Can't take NSAIDs b/c he's on DOAC for his a-fib.  Past Medical History:  Diagnosis Date  . Alcohol abuse    ongoing as of 07/2017.  Quit after CVA 05/2018  . Arthritis   . Atrial fibrillation ([place])    Not candidate for anticoagulation b/c of GI bleeding; had to get transfused.  Then, pt had CVA 05/2018 and he was put on xarelto and he quit drinking.  . CVA (cerebral vascular accident) ([place]) 05/2018   large R MCA territory infarct.  CTA neg for large cerebral vessel occlusion.  R ICA 70% stenosis, L clear. Rpt CTA neck ordered by neuro as  of 01/2019.  Marland Kitchen Gastric ulcer    hx of--unclear (pt and wife deny this)  . Gout    Usually MTP joint  . History of adenomatous polyp of colon 2021   recall 2024  . History of digital clubbing    "all my adult life"--CXR 08/2016 = bronchitic changes o/w normal.  . Hyperkalemia 04/2017   Normalized with decreasing lisinopril from 40 mg qd to 20 mg qd.  . Hyperlipidemia 07/2016   Holding off on statin until pt quits drinking.  Back on statin after CVA 05/2018  .  Hypertension   . Nonischemic cardiomyopathy ([place]) 2015; 2020   2015: "resolved" (w/medical therapy?).  05/2018 EF 35-40%, with TR and MR and ++LAE.  Marland Kitchen Nonrheumatic mitral valve regurgitation   . Osteoarthritis of right hip    Scheduled for surgery 05/11/15 but he then declined to get the surgery.  . Tobacco abuse    Quit 2014  . Type II diabetes mellitus ([place]) 07/2016   Dx'd by two fasting glucoses > 126 (Hb a1c 6.2% at that time).      Past Surgical History:  Procedure Laterality Date  . CARDIAC CATHETERIZATION  2015   Normal coronaries  . CARDIOVERSION     DCCV 2015  . COLONOSCOPY  08/09/2019   One large adenoma-->recall 3 yrs  . TONSILLECTOMY    . TRANSTHORACIC ECHOCARDIOGRAM  06/08/2018   (in context of acute CVA): EF 35-40%, mild/mod MR, mod/sev TR, severely dilated LA.    Outpatient Medications Prior to Visit  Medication Sig Dispense Refill  . allopurinol (ZYLOPRIM) 100 MG tablet Take 2 tablets (200 mg total) by mouth daily. 180 tablet 3  . atorvastatin (LIPITOR) 40 MG tablet Take 1 tablet (40 mg total) by mouth daily. 90 tablet 3  . digoxin (LANOXIN) 0.125 MG tablet TAKE ONE TABLET BY MOUTH DAILY 30 tablet 2  . folic acid (FOLVITE) 1 MG tablet 1 tab po qd 90 tablet 1  . lisinopril (ZESTRIL) 20 MG tablet Take 1 tablet (20 mg total) by mouth daily. 30 tablet 4  . metoprolol succinate (TOPROL-XL) 25 MG 24 hr tablet Take 25 mg by mouth daily.    . pantoprazole (PROTONIX) 40 MG tablet TAKE ONE TABLET BY MOUTH TWICE A DAY (Patient taking differently: Take 40 mg by mouth 2 (two) times daily. ) 60 tablet 5  . XARELTO 20 MG TABS tablet Take 1 tablet (20 mg total) by mouth daily with supper. 90 tablet 3   No facility-administered medications prior to visit.    No Known Allergies  ROS As per HPI  PE: Vitals with BMI 09/26/2019 07/31/2019 07/16/2019  Height - 5\' 10"  5' 5.5"  Weight 173 lbs 6 oz 170 lbs 171 lbs 3 oz  BMI - 60.63 01.60  Systolic 109 323 557  Diastolic 77 82 71   Pulse 65 88 71     Gen: Alert, well appearing.  Patient is oriented to person, place, time, and situation. AFFECT: pleasant, lucid thought and speech. CV: irreg irreg, rate approx 80, no murmur/rub/gallop Chest is clear, no wheezing or rales. Normal symmetric air entry throughout both lung fields. No chest wall deformities or tenderness. EXT: no clubbing or cyanosis.  no edema.  Neuro: nonfocal. R hip: severe limitation of ROM.  LABS:  Lab Results  Component Value Date   TSH 1.38 05/09/2019   Lab Results  Component Value Date   WBC 10.5 06/28/2019   HGB 13.2 06/28/2019   HCT 42.0 06/28/2019  MCV 94.8 06/28/2019   PLT 201 06/28/2019   Lab Results  Component Value Date   CREATININE 0.87 06/28/2019   BUN 12 06/28/2019   NA 141 06/28/2019   K 3.8 06/28/2019   CL 102 06/28/2019   CO2 31 06/28/2019   Lab Results  Component Value Date   ALT 20 06/29/2019   AST 20 06/29/2019   ALKPHOS 81 06/29/2019   BILITOT 1.2 06/29/2019   Lab Results  Component Value Date   CHOL 133 05/09/2019   Lab Results  Component Value Date   HDL 56.20 05/09/2019   Lab Results  Component Value Date   LDLCALC 68 05/09/2019   Lab Results  Component Value Date   TRIG 43.0 05/09/2019   Lab Results  Component Value Date   CHOLHDL 2 05/09/2019   Lab Results  Component Value Date   PSA 1.70 05/09/2019   PSA 1.90 07/31/2017   PSA 2.08 08/01/2016   Lab Results  Component Value Date   HGBA1C 5.9 05/09/2019   Lab Results  Component Value Date   DIGOXIN 0.4 (L) 06/29/2019   IMPRESSION AND PLAN:  1) DM 2: no home monitoring. Historically well controlled.  Noncompliant with diet.  No activity. HbA1c and lytes/cr today. Flu vaccine given today.  2) HTN: The current medical regimen is effective;  continue present plan and medications. Lytes/cr today.  3) HLD: tolerating statin. FLP today.  4) A-fib, rate controlled --on dig and toprol, needs to go to cardiologist's lab and  get dig level ordered back in July when dose was changed to qod. He's asymptomatic.  Continue xarelto.  5) Hx of CVA: no residual deficit except swallowing dysfunction--needs to drink through a straw. Continue statin, glucose control, bp control.  6) Severe chronic R hip pain from severe osteoarthritis in R hip. He asks to be referred to ortho surgeon to see if he is a surgical candidate--ordered referral today.  An After Visit Summary was printed and given to the patient.  FOLLOW UP: Return in about 4 months (around 01/26/2020) for routine chronic illness f/u.  Signed:  Crissie Sickles, MD           09/26/2019

## 2019-09-26 NOTE — Addendum Note (Signed)
Addended by: Octaviano Glow on: 09/26/2019 04:46 PM   Modules accepted: Orders

## 2019-09-27 LAB — LIPID PANEL
Cholesterol: 129 mg/dL (ref <200)
HDL: 63 mg/dL (ref >=40)
LDL Cholesterol (Calc): 54 mg/dL (calc)
Non-HDL Cholesterol (Calc): 66 mg/dL (calc) (ref <130)
Total CHOL/HDL Ratio: 2 (calc) (ref <5.0)
Triglycerides: 43 mg/dL (ref <150)

## 2019-09-27 LAB — BASIC METABOLIC PANEL
BUN: 15 mg/dL (ref 7–25)
CO2: 31 mmol/L (ref 20–32)
Calcium: 9.6 mg/dL (ref 8.6–10.3)
Chloride: 101 mmol/L (ref 98–110)
Creat: 0.97 mg/dL (ref 0.70–1.33)
Glucose, Bld: 96 mg/dL (ref 65–99)
Potassium: 4.9 mmol/L (ref 3.5–5.3)
Sodium: 142 mmol/L (ref 135–146)

## 2019-09-27 LAB — HEMOGLOBIN A1C
Hgb A1c MFr Bld: 5.7 % of total Hgb — ABNORMAL HIGH (ref <5.7)
Mean Plasma Glucose: 117 (calc)
eAG (mmol/L): 6.5 (calc)

## 2019-11-05 ENCOUNTER — Other Ambulatory Visit: Payer: Self-pay

## 2019-11-05 MED ORDER — DIGOXIN 125 MCG PO TABS: 125.0000 ug | ORAL_TABLET | Freq: Every day | ORAL | 2 refills | Status: DC

## 2019-11-22 DIAGNOSIS — I340 Nonrheumatic mitral (valve) insufficiency: Secondary | ICD-10-CM | POA: Diagnosis not present

## 2019-11-22 DIAGNOSIS — I4891 Unspecified atrial fibrillation: Secondary | ICD-10-CM | POA: Diagnosis not present

## 2019-11-22 DIAGNOSIS — I428 Other cardiomyopathies: Secondary | ICD-10-CM | POA: Diagnosis not present

## 2019-11-22 DIAGNOSIS — I361 Nonrheumatic tricuspid (valve) insufficiency: Secondary | ICD-10-CM | POA: Diagnosis not present

## 2019-11-22 DIAGNOSIS — I4892 Unspecified atrial flutter: Secondary | ICD-10-CM | POA: Diagnosis not present

## 2020-01-27 ENCOUNTER — Ambulatory Visit: Payer: Medicare HMO | Admitting: Family Medicine

## 2020-01-30 ENCOUNTER — Other Ambulatory Visit: Payer: Self-pay | Admitting: Family Medicine

## 2020-02-03 NOTE — Progress Notes (Deleted)
[facility] [address] [place]. [place] [address] [phone]       STROKE FOLLOW UP NOTE  [name] Date of Birth:  20-Jun-1960 Medical Record Number:  [id number]   Reason for Referral: stroke follow up    CHIEF COMPLAINT:  No chief complaint on file.   HPI:  Today, 02/04/2020, [name] returns for 13-month stroke follow-up.  Residual dysarthria and dysphagia stable without worsening.  Denies new stroke/TIA symptoms.  Remains on Xarelto and atorvastatin for secondary stroke prevention without side effects.  Lipid panel 09/2019 LDL 54.  Blood pressure today ***.     History provided for reference purposes only Update 07/31/2019 JM: [name] returns for stroke follow up accompanied by his wife.  Residual deficits of dysarthria and dysphagia which has been stable without worsening.  He was recently seen in ED on 06/28/2019 for generalized weakness complaints along with SOB, diaphoretic, heart racing with evidence of A. fib with RVR and COVID-19 positive.  RVR resolved prior to intervention.  No focal neurological deficits reported and CT head, CT perfusion and CTA head/neck unremarkable for acute abnormality.  He has been stable since that time he denies new or worsening stroke/TIA symptoms.  Apparently prior to symptom onset, he was sitting in the sun for a long duration.  Remains on Xarelto and atorvastatin without side effects.  Blood pressure today 140/82.  Continues to experience right hip pain which limits daily activity and ambulation.  No further concerns at this time.  Update 01/28/2019 JM: [name] is a 60 year old male being seen today for stroke follow up accompanied by his wife.  Residual stroke deficits of subjective dysphagia and occasional word finding difficulty but does endorse ongoing improvement.  He has also been having increased difficulty with right hip pain which has been limiting functional activity.  He was evaluated by home  therapies with improvement of ambulation and no longer requires assistive device.  Per wife, swallowing satisfactory and no evidence of dysphagia.  He reports occasional difficulty with coughing on his saliva but no difficulty with thin liquids or foods.  He continues on Xarelto and atorvastatin for secondary stroke prevention with side effects.  Blood pressure today satisfactory 126/83.  Denies new or worsening stroke/TIA symptoms.  Initial visit 08/15/2018 JM: He has since been discharged home with residual dysarthria, word finding difficulty and dysphasia.  Home OT evaluated but discontinued as it was felt as though he would benefit more from PT/ST which will start this Friday.  Currently ambulating with rolling walker due to dizziness/balance difficulties but denies any falls.  He has been experiencing increased coughing while eating.  He is eating regular food at home but wife does ensure pieces are caught up small.  He has remained on nectar thick liquid which is typically when he will start coughing.  Wife states he uses a coffee stirring straw to ensure he does not drink too much at once.  He has been having blood pressure medications adjusted by PCP due to potential orthostatic dizziness and hypotension likely secondary to weight loss since stroke and decreased cardiac output.  He does endorse improvement of blood pressures with medication changes and decreased dizziness episodes.  He has completely quit drinking alcohol since discharge.  Currently on Xarelto without bleeding or bruising (discharged on Eliquis after hospitalization - ?  Change in medication during SNF).  Continues on atorvastatin without myalgias.  Denies new or worsening stroke/TIA symptoms.  Stroke admission 06/06/2018: [name] is a 60 y.o. male with history of Etoh Abuse, Afib not on [place] d/t GIB, DM, peptic ulcer, hypertension, hyperlipidemia, gout who presented with AMS and dysarthria. Found to have large subacute RMCA infarct  secondary to A. fib not on AC versus right ICA high-grade stenosis with resultant severe dysarthria.  MRI unable to be performed due to metal insult.  Vessel imaging showed 70% proximal right ICA stenosis and occlusion of right vertebral artery at its origin.  2D echo showed an EF of 35 to 40%.  LDL 103 and A1c 5.5.  It was recommended to initiate Eliquis due to atrial fibrillation and recent stroke.  Initiated atorvastatin 40 mg daily for HLD management.  Current tobacco use of smoking cessation counseling provided.  Repeat CT head on 06/07/2018 showed acute hypoxic decompensation and respiratory distress ultimately needing intubation secondary to aspiration pneumonia versus pneumonitis with successful extubation 06/09/2018.  Initially failed swallow eval with need of NG tube but was able to be discharged on modified diet.  Due to residual deficits, he was discharged to SNF for ongoing therapies.  Stroke: R LARGE MCA infarct due to A. fib not on AC vs. right ICA high-grade stenosis  Resultant severe dysarthria  CT head R LARGE MCA stroke  MRI head - not able to performed due to metal in skull  CTA H&N - 70% stenosis proximal right ICA.  Occlusion of the right vertebral artery at its origin.   2D Echo - EF 35 to 40%.  No wall motion abnormalities.  EEG - moderate global slowing indicating a moderate global encephalopathy  [employer] Virus 2  - negative  LDL - 103  HgbA1c - 5.5  UDS - negative  VTE prophylaxis - lovenox  None prior to admission, now on ASA 325mg . Pt agrees with DOAC after discussion with Dr. Debara Pickett. Will recommend eliquis on 06/16/18 to avoid hemorrhagic conversion.   Ongoing aggressive stroke risk factor management  Therapy recommendations:  SNF  Disposition:  SNF    ROS:   14 system review of systems performed and negative with exception of speech and swallowing difficulty and pain  PMH:  Past Medical History:  Diagnosis Date  . Alcohol abuse    ongoing as of  07/2017.  Quit after CVA 05/2018  . Arthritis   . Atrial fibrillation ([place])    Not candidate for anticoagulation b/c of GI bleeding; had to get transfused.  Then, pt had CVA 05/2018 and he was put on xarelto and he quit drinking.  . CVA (cerebral vascular accident) ([place]) 05/2018   large R MCA territory infarct.  CTA neg for large cerebral vessel occlusion.  R ICA 70% stenosis, L clear. Rpt CTA neck ordered by neuro as of 01/2019.  Marland Kitchen Gastric ulcer    hx of--unclear (pt and wife deny this)  . Gout    Usually MTP joint  . History of adenomatous polyp of colon 2021   recall 2024  . History of digital clubbing    "all my adult life"--CXR 08/2016 = bronchitic changes o/w normal.  . Hyperkalemia 04/2017   Normalized with decreasing lisinopril from 40 mg qd to 20 mg qd.  . Hyperlipidemia 07/2016   Holding off on statin until pt quits drinking.  Back on statin after CVA 05/2018  . Hypertension   . Nonischemic cardiomyopathy ([place]) 2015; 2020   2015: "resolved" (w/medical therapy?).  05/2018 EF 35-40%, with TR and MR and ++LAE.  Marland Kitchen Nonrheumatic mitral valve regurgitation   .  Osteoarthritis of right hip    Scheduled for surgery 05/11/15 but he then declined to get the surgery.  . Tobacco abuse    Quit 2014  . Type II diabetes mellitus ([place]) 07/2016   Dx'd by two fasting glucoses > 126 (Hb a1c 6.2% at that time).      PSH:  Past Surgical History:  Procedure Laterality Date  . CARDIAC CATHETERIZATION  2015   Normal coronaries  . CARDIOVERSION     DCCV 2015  . COLONOSCOPY  08/09/2019   One large adenoma-->recall 3 yrs  . TONSILLECTOMY    . TRANSTHORACIC ECHOCARDIOGRAM  06/08/2018   (in context of acute CVA): EF 35-40%, mild/mod MR, mod/sev TR, severely dilated LA.    Social History:  Social History   Socioeconomic History  . Marital status: Married    Spouse name: Not on file  . Number of children: Not on file  . Years of education: Not on file  . Highest education level: Not on file   Occupational History  . Not on file  Tobacco Use  . Smoking status: Former Smoker    Packs/day: 1.00    Years: 5.00    Pack years: 5.00    Types: Cigarettes    Quit date: 01/10/2013    Years since quitting: 7.0  . Smokeless tobacco: Never Used  Vaping Use  . Vaping Use: Former  Substance and Sexual Activity  . Alcohol use: Yes    Comment: drinks liquor daily  . Drug use: No  . Sexual activity: Not on file  Other Topics Concern  . Not on file  Social History Narrative   Married, 2 adult children.   Educ: 11th grade   Occup: "out of work".  Worked for [employer]--was let go for not abiding by [employer] per pt/wife.   Tob: former--quit 2014.  "Of and on prior".   Alc: long hx of drinking a pint and 1 and 1/2 pint of whiskey per day x years---quit approx May 2020.Marland Kitchen   Drugs: none.   Social Determinants of Health   Financial Resource Strain: Not on file  Food Insecurity: Not on file  Transportation Needs: Not on file  Physical Activity: Not on file  Stress: Not on file  Social Connections: Not on file  Intimate Partner Violence: Not on file    Family History:  Family History  Problem Relation Age of Onset  . Diabetes Mother   . Congestive Heart Failure Mother   . Alcohol abuse Father   . Hypertension Father   . Stroke Father   . Heart disease Paternal Grandfather     Medications:   Current Outpatient Medications on File Prior to Visit  Medication Sig Dispense Refill  . allopurinol (ZYLOPRIM) 100 MG tablet Take 2 tablets (200 mg total) by mouth daily. 180 tablet 3  . atorvastatin (LIPITOR) 40 MG tablet Take 1 tablet (40 mg total) by mouth daily. 90 tablet 1  . digoxin (LANOXIN) 0.125 MG tablet TAKE ONE TABLET BY MOUTH DAILY 30 tablet 5  . folic acid (FOLVITE) 1 MG tablet TAKE ONE TABLET BY MOUTH DAILY 90 tablet 1  . lisinopril (ZESTRIL) 20 MG tablet Take 1 tablet (20 mg total) by mouth daily. 90 tablet 1  . metoprolol succinate (TOPROL-XL) 25 MG 24 hr  tablet Take 1 tablet (25 mg total) by mouth daily. 90 tablet 1  . pantoprazole (PROTONIX) 40 MG tablet Take 1 tablet (40 mg total) by mouth 2 (two) times daily. [place]  tablet 1  . XARELTO 20 MG TABS tablet TAKE ONE TABLET BY MOUTH DAILY WITH SUPPER 90 tablet 1   No current facility-administered medications on file prior to visit.    Allergies:  No Known Allergies   Physical Exam  There were no vitals filed for this visit. There is no height or weight on file to calculate BMI. No exam data present  General: Frail middle-aged but elderly-appearing Caucasian male, seated, in no evident distress Head: head normocephalic and atraumatic.   Neck: supple with no carotid or supraclavicular bruits Cardiovascular: irregular rate and rhythm, no murmurs Musculoskeletal: no deformity; limited right hip ROM due to pain Skin:  no rash/petichiae Vascular:  Normal pulses all extremities   Neurologic Exam Mental Status: Awake and fully alert.   Mild to moderate dysarthria.  Oriented to place and time. Recent and remote memory intact. Attention span, concentration and fund of knowledge slightly diminished with wife helping to provide some information. Mood and affect appropriate.  Cranial Nerves: Pupils equal, briskly reactive to light. Extraocular movements full without nystagmus. Visual fields full to confrontation. Hearing intact. Facial sensation intact.  Mild left lower facial paralysis, tongue, palate moves normally and symmetrically.  Motor: Normal bulk and tone.  Equal and normal strength tested on all extremities except difficulty testing RLE due to hip pain Sensory.: intact to touch , pinprick , position and vibratory sensation.  Coordination: Rapid alternating movements normal in all extremities. Finger-to-nose performed accurately bilaterally and heel-to-shin performed accurately on left side but unable to perform on right side due to hip pain. Gait and Station: Arises from chair with difficulty.  Stance is normal. Gait demonstrates normal stride length with favoring of right leg due to hip pain and use of rolling walker Reflexes: 1+ and symmetric. Toes downgoing.      ASSESSMENT: Delan Canjura is a 60 y.o. year old male presented with altered mental status and dysarthria with findings of right large MCA infarct on 06/06/2018 secondary to likely atrial fibrillation not on AC. Vascular risk factors include right ICA high-grade stenosis, EtOH use, A. fib not on AC, HTN, HLD and tobacco use.      PLAN:  1. Right MCA infarct:  -Residual deficits: Dysphagia and dysarthria.  Remains on regular diet but has occasional difficulty with saliva, hard foods or large sips of liquids.  Compensates appropriately.   -Continue Xarelto (rivaroxaban) daily  and atorvastatin for secondary stroke prevention.   -Close PCP follow-up for aggressive stroke risk factor management 2. Right ICA stenosis: Recent CTA right ICA 50% stenosis (prior 70%) and left ICA mild arthrosclerotic plaque; chronic occlusion proximal right vertebral artery.  Continue aggressive stroke risk factor management.  Prior CTA 06/2019.  Plan on repeating carotid ultrasound in 6 months for surveillance monitoring 3. Persistent atrial fibrillation: s/p DCCV 2015.  Continue Xarelto and ongoing follow-up by cardiology for management and monitoring 4. HTN: BP goal<130/90.  Stable today.  F/u with PCP 5. HLD: LDL goal<70.  Recent LDL 68.  Continue atorvastatin and ongoing follow-up with PCP for monitoring of lipid panel, prescribing of statin and ongoing monitoring and management 6. Generalized weakness episode: Likely in setting of A. fib in RVR possibly occurring due to prolonged duration of sitting in heat and potentially with COVID-19 positive testing although asymptomatic to typical symptoms.  Encouraged to avoid sitting in direct sun for prolonged periods of time and to ensure adequate hydration and fluid intake.  Denies reoccurring signs or  symptoms   Follow up in  6 months or call earlier if needed   I spent 30 minutes of face-to-face and non-face-to-face time with patient and wife.  This included previsit chart review, lab review, study review, order entry, electronic health record documentation, patient education regarding history of stroke, residual deficits, importance of managing stroke risk factors and answered all questions to patient and wife satisfaction   Frann Rider, AGNP-BC  [facility] [address] [place], [place] [address]  Phone [phone] Fax [phone] Note: This document was prepared with digital dictation and possible smart phrase technology. Any transcriptional errors that result from this process are unintentional.

## 2020-02-04 ENCOUNTER — Ambulatory Visit: Payer: MEDICARE | Admitting: Adult Health

## 2020-04-12 ENCOUNTER — Other Ambulatory Visit: Payer: Self-pay | Admitting: Family Medicine

## 2020-04-12 DIAGNOSIS — E7800 Pure hypercholesterolemia, unspecified: Secondary | ICD-10-CM

## 2020-04-13 ENCOUNTER — Telehealth: Payer: Self-pay

## 2020-04-13 DIAGNOSIS — Z79899 Other long term (current) drug therapy: Secondary | ICD-10-CM

## 2020-04-13 DIAGNOSIS — E7800 Pure hypercholesterolemia, unspecified: Secondary | ICD-10-CM

## 2020-04-13 MED ORDER — ATORVASTATIN CALCIUM 40 MG PO TABS: 1.0000 | ORAL_TABLET | Freq: Every day | ORAL | 0 refills | Status: DC

## 2020-04-13 MED ORDER — PANTOPRAZOLE SODIUM 40 MG PO TBEC: 40.0000 mg | DELAYED_RELEASE_TABLET | Freq: Two times a day (BID) | ORAL | 0 refills | Status: DC

## 2020-04-13 NOTE — Telephone Encounter (Signed)
Patient wife Freda Munro [facility]) calling about meds.  Asked for [facility].  atorvastatin (LIPITOR) 40 MG tablet [[id number]]  Pharmacy only received 30 d/s.  Patient usually gets 90 d/s Because of cost, can he please be approved for 90 d/s and send in another prescription.  CPE scheduled 5/6, wife is coming same day for her appt;  Call sheila at work if any questions/concerns [phone]  pantoprazole (PROTONIX) 40 MG tablet [[id number]]  41 d/s   [place], [place]

## 2020-04-13 NOTE — Telephone Encounter (Signed)
90 D/S SENT.  MUST KEEP UPCOMING APPT

## 2020-05-14 ENCOUNTER — Other Ambulatory Visit: Payer: Self-pay

## 2020-05-14 ENCOUNTER — Encounter: Payer: Self-pay | Admitting: Family Medicine

## 2020-05-14 NOTE — Progress Notes (Signed)
Office Note 05/15/2020  CC:  Chief Complaint  Patient presents with  . Annual Exam    Fasting    HPI:  Robert [name] is a 60 y.o. White male who is here accompanied by his wife Adela Lank for annual health maintenance exam and 8 month f/u hx of large R MCA territory CVA, chronic a-fib, DM 2, HTN, HLD. He also has NICM and MVR followed by [facility] cardiologist. A/P as of last visit: "1) DM 2: no home monitoring. Historically well controlled.  Noncompliant with diet.  No activity. HbA1c and lytes/cr today. Flu vaccine given today.  2) HTN: The current medical regimen is effective;  continue present plan and medications. Lytes/cr today.  3) HLD: tolerating statin. FLP today.  4) A-fib, rate controlled --on dig and toprol, needs to go to cardiologist's lab and get dig level ordered back in July when dose was changed to qod. He's asymptomatic.  Continue xarelto.  5) Hx of CVA: no residual deficit except swallowing dysfunction--needs to drink through a straw. Continue statin, glucose control, bp control.  6) Severe chronic R hip pain from severe osteoarthritis in R hip. He asks to be referred to ortho surgeon to see if he is a surgical candidate--ordered referral today."  INTERIM HX: Earlier this week he fell b/c tried to get out of bed too quickly and hit L chest lateral/post wall onto edge of couch.  No dizziness, no head trauma, no LOC.  DM: no home bp monitoring.  Does NOT eat diabetic diet.  No soft drinks.  Some sweet tea, lots of water. Feet->no burning, tingling, or numbness in feet.  HTN: no home monitoring.  HLD: tolerating atorva 40mg  qd.  His arthritis is bothering him a lot all the time, takes tylenol but minimal help.  Hands, hips, knees.  A-fib Currently taking dig 0.125 every other day->today was a day he did take it. Sometimes while lying in bed at night he feels heart beat fast, otherwise asymptomatic.  Past Medical History:  Diagnosis Date  . Alcohol  abuse    ongoing as of 07/2017.  Quit after CVA 05/2018  . Arthritis   . Atrial fibrillation ([place])    Not candidate for anticoagulation b/c of GI bleeding; had to get transfused.  Then, pt had CVA 05/2018 and he was put on xarelto and he quit drinking.  Marland Kitchen COVID-19 virus infection 06/2019  . CVA (cerebral vascular accident) ([place]) 05/2018   large R MCA territory infarct.  CTA neg for large cerebral vessel occlusion.  R ICA 70% stenosis, L clear. Rpt CTA neck ordered by neuro as of 01/2019.  Marland Kitchen Gastric ulcer    hx of--unclear (pt and wife deny this)  . Gout    Usually MTP joint  . History of adenomatous polyp of colon 2021   recall 2024  . History of digital clubbing    "all my adult life"--CXR 08/2016 = bronchitic changes o/w normal.  . Hyperkalemia 04/2017   Normalized with decreasing lisinopril from 40 mg qd to 20 mg qd.  . Hyperlipidemia 07/2016   Holding off on statin until pt quits drinking.  Back on statin after CVA 05/2018  . Hypertension   . Nonischemic cardiomyopathy ([place]) 2015; 2020   2015: "resolved" (w/medical therapy?).  05/2018 EF 35-40%, with TR and MR and ++LAE. 05/2019 EF 50-52%, low normal LV wall motion, grd II DD, mod MR and mod TR, mild pulm HTN.  Marland Kitchen Nonrheumatic mitral valve regurgitation   . Osteoarthritis of right  hip    Scheduled for surgery 05/11/15 but he then declined to get the surgery.  . Tobacco abuse    Quit 2014  . Type II diabetes mellitus ([place]) 07/2016   Dx'd by two fasting glucoses > 126 (Hb a1c 6.2% at that time).      Past Surgical History:  Procedure Laterality Date  . CARDIAC CATHETERIZATION  2015   Normal coronaries  . CARDIOVERSION     DCCV 2015  . COLONOSCOPY  08/09/2019   One large adenoma-->recall 3 yrs  . TONSILLECTOMY    . TRANSTHORACIC ECHOCARDIOGRAM  06/08/2018; 05/2019   05/2018 (in context of acute CVA): EF 35-40%, mild/mod MR, mod/sev TR, severely dilated LA.  05/2019 EF 50-52%, low normal LV wall motion, grd II DD, mod MR and mod TR, mild  pulm HTN.    Family History  Problem Relation Age of Onset  . Diabetes Mother   . Congestive Heart Failure Mother   . Alcohol abuse Father   . Hypertension Father   . Stroke Father   . Heart disease Paternal Grandfather     Social History   Socioeconomic History  . Marital status: Married    Spouse name: Not on file  . Number of children: Not on file  . Years of education: Not on file  . Highest education level: Not on file  Occupational History  . Not on file  Tobacco Use  . Smoking status: Former Smoker    Packs/day: 1.00    Years: 5.00    Pack years: 5.00    Types: Cigarettes    Quit date: 01/10/2013    Years since quitting: 7.3  . Smokeless tobacco: Never Used  Vaping Use  . Vaping Use: Former  Substance and Sexual Activity  . Alcohol use: Yes    Comment: drinks liquor daily  . Drug use: No  . Sexual activity: Not on file  Other Topics Concern  . Not on file  Social History Narrative   Married, 2 adult children.   Educ: 11th grade   Occup: "out of work".  Worked for [employer]--was let go for not abiding by [employer] per pt/wife.   Tob: former--quit 2014.  "Of and on prior".   Alc: long hx of drinking a pint and 1 and 1/2 pint of whiskey per day x years---quit approx May 2020.Marland Kitchen   Drugs: none.   Social Determinants of Health   Financial Resource Strain: Not on file  Food Insecurity: Not on file  Transportation Needs: Not on file  Physical Activity: Not on file  Stress: Not on file  Social Connections: Not on file  Intimate Partner Violence: Not on file    Outpatient Medications Prior to Visit  Medication Sig Dispense Refill  . allopurinol (ZYLOPRIM) 100 MG tablet Take 2 tablets (200 mg total) by mouth daily. 180 tablet 3  . atorvastatin (LIPITOR) 40 MG tablet Take 1 tablet (40 mg total) by mouth daily. 90 tablet 0  . digoxin (LANOXIN) 0.125 MG tablet TAKE ONE TABLET BY MOUTH DAILY (Patient taking differently: Every  other day) 30 tablet 5  .  folic acid (FOLVITE) 1 MG tablet TAKE ONE TABLET BY MOUTH DAILY 90 tablet 1  . pantoprazole (PROTONIX) 40 MG tablet Take 1 tablet (40 mg total) by mouth 2 (two) times daily. 180 tablet 0  . XARELTO 20 MG TABS tablet TAKE ONE TABLET BY MOUTH DAILY WITH SUPPER 90 tablet 1  . lisinopril (ZESTRIL) 20 MG tablet Take 1  tablet (20 mg total) by mouth daily. 90 tablet 1  . metoprolol succinate (TOPROL-XL) 25 MG 24 hr tablet Take 1 tablet (25 mg total) by mouth daily. 90 tablet 1   No facility-administered medications prior to visit.    No Known Allergies  ROS Review of Systems  Constitutional: Negative for appetite change, chills, fatigue and fever.  HENT: Negative for congestion, dental problem, ear pain and sore throat.   Eyes: Negative for discharge, redness and visual disturbance.  Respiratory: Negative for cough, chest tightness, shortness of breath and wheezing.   Cardiovascular: Negative for chest pain, palpitations and leg swelling.  Gastrointestinal: Negative for abdominal pain, blood in stool, diarrhea, nausea and vomiting.  Genitourinary: Negative for difficulty urinating, dysuria, flank pain, frequency, hematuria and urgency.  Musculoskeletal: Positive for arthralgias (chronic, most joints, particularly R hip). Negative for back pain, joint swelling, myalgias and neck stiffness.  Skin: Negative for pallor and rash.  Neurological: Negative for dizziness, speech difficulty, weakness and headaches.  Hematological: Negative for adenopathy. Does not bruise/bleed easily.  Psychiatric/Behavioral: Negative for confusion and sleep disturbance. The patient is not nervous/anxious.     PE; Vitals with BMI 05/15/2020 09/26/2019 07/31/2019  Height 5\' 7"  - 5\' 10"   Weight 183 lbs 10 oz 173 lbs 6 oz 170 lbs  BMI 79.02 - 40.97  Systolic 353 299 242  Diastolic 82 77 82  Pulse 73 65 88   Gen: Alert, well appearing.  Patient is oriented to person, place, time, and situation. AFFECT: pleasant, lucid  thought and speech. ENT: Ears: EACs clear, normal epithelium.  TMs with good light reflex and landmarks bilaterally.  Eyes: no injection, icteris, swelling, or exudate.  EOMI, PERRLA. Nose: no drainage or turbinate edema/swelling.  No injection or focal lesion.  Mouth: lips without lesion/swelling.  Oral mucosa pink and moist.  Dentition intact and without obvious caries or gingival swelling.  Oropharynx without erythema, exudate, or swelling.  Neck: supple/nontender.  No LAD, mass, or TM.  Carotid pulses 2+ bilaterally, without bruits. CV: RRR, no m/r/g.   Left posterolateral chest wall with mild diffuse TTP, no bruising or erythema or deformity. LUNGS: CTA bilat, nonlabored resps, good aeration in all lung fields. ABD: soft, NT, ND, BS normal.  No hepatospenomegaly or mass.  No bruits. EXT: digital clubbing is present.  No cyanosis.  He has 2+ L LL pitting edema down to ankle, just a trace of pitting in RLL.  No calf tenderness on either side. Musculoskeletal: no joint erythema, warmth, or tenderness.  Diffuse hypertrophy of all hand/finger joints with intact ROM but stiffness.  Marked impairment of R hip ROM, causing severe antalgic gait, barely able to get himself up onto exam table. Skin - no sores or suspicious lesions or rashes or color changes Foot exam - some large calluses on plantar aspects bilat.  no swelling, tenderness or skin or vascular lesions. Color and temperature is normal. Sensation is mildly diminished distally on L foot plantar surface. Peripheral pulses are palpable. Toenails are somewhat thickened and brittle.   Pertinent labs:  Lab Results  Component Value Date   TSH 1.38 05/09/2019   Lab Results  Component Value Date   WBC 10.5 06/28/2019   HGB 13.2 06/28/2019   HCT 42.0 06/28/2019   MCV 94.8 06/28/2019   PLT 201 06/28/2019   Lab Results  Component Value Date   CREATININE 0.97 09/26/2019   BUN 15 09/26/2019   NA 142 09/26/2019   K 4.9 09/26/2019   CL  101  09/26/2019   CO2 31 09/26/2019   Lab Results  Component Value Date   ALT 20 06/29/2019   AST 20 06/29/2019   ALKPHOS 81 06/29/2019   BILITOT 1.2 06/29/2019   Lab Results  Component Value Date   CHOL 129 09/26/2019   Lab Results  Component Value Date   HDL 63 09/26/2019   Lab Results  Component Value Date   LDLCALC 54 09/26/2019   Lab Results  Component Value Date   TRIG 43 09/26/2019   Lab Results  Component Value Date   CHOLHDL 2.0 09/26/2019   Lab Results  Component Value Date   PSA 1.70 05/09/2019   PSA 1.90 07/31/2017   PSA 2.08 08/01/2016   Lab Results  Component Value Date   HGBA1C 5.7 (H) 09/26/2019   ASSESSMENT AND PLAN:   1) DM 2: historically well controlled but he does not eat diabetic diet. Hba1c and urine microalb/cr today. Feet exam today.  2) HTN: controlled.  Cont toprol xl 25mg  qd. Lytes/cr today.  3) HLD: tolerating statin. FLP and hepatic panel today.  4) Health maintenance exam: Reviewed age and gender appropriate health maintenance issues (prudent diet, regular exercise, health risks of tobacco and excessive alcohol, use of seatbelts, fire alarms in home, use of sunscreen).  Also reviewed age and gender appropriate health screening as well as vaccine recommendations. Vaccines: Needs prevnar 20 then pneumococcal vaccines are complete-->given today.  Otherwise ALL UTD. Labs: fasting HP, Hba1c, urine microalb/cr, PSA. Prostate ca screening: PSA today. Colon ca screening: hx of adenoma, recall 07/2022.  5) Chronic atrial fibrillation:  Asymptomatic on low dose BB rate control and dig 0.125mg  qod. Followed by cardiology. NON-TROUGH dig level done today-->forward result to his cardiologist.  6) Chronic R hip pain d/t severe DJD: certainly needs hip replacement but his cardiac risk has been too high.  He'll get ongoing re-eval by his cardiologist.  7) L posterolat chest wall contusion: resolving.  Reassured.  An After Visit Summary was  printed and given to the patient.  FOLLOW UP:  Return in about 6 months (around 11/15/2020) for routine chronic illness f/u.  Signed:  Crissie Sickles, MD           05/15/2020

## 2020-05-15 ENCOUNTER — Encounter: Payer: Self-pay | Admitting: Family Medicine

## 2020-05-15 ENCOUNTER — Ambulatory Visit (INDEPENDENT_AMBULATORY_CARE_PROVIDER_SITE_OTHER): Payer: Medicare HMO | Admitting: Family Medicine

## 2020-05-15 VITALS — BP 127/82 | HR 73 | Temp 98.0°F | Resp 16 | Ht 67.0 in | Wt 183.6 lb

## 2020-05-15 DIAGNOSIS — Z125 Encounter for screening for malignant neoplasm of prostate: Secondary | ICD-10-CM

## 2020-05-15 DIAGNOSIS — E1149 Type 2 diabetes mellitus with other diabetic neurological complication: Secondary | ICD-10-CM | POA: Diagnosis not present

## 2020-05-15 DIAGNOSIS — I482 Chronic atrial fibrillation, unspecified: Secondary | ICD-10-CM | POA: Diagnosis not present

## 2020-05-15 DIAGNOSIS — Z0000 Encounter for general adult medical examination without abnormal findings: Secondary | ICD-10-CM

## 2020-05-15 DIAGNOSIS — Z8673 Personal history of transient ischemic attack (TIA), and cerebral infarction without residual deficits: Secondary | ICD-10-CM

## 2020-05-15 DIAGNOSIS — I10 Essential (primary) hypertension: Secondary | ICD-10-CM | POA: Diagnosis not present

## 2020-05-15 DIAGNOSIS — E7800 Pure hypercholesterolemia, unspecified: Secondary | ICD-10-CM | POA: Diagnosis not present

## 2020-05-15 DIAGNOSIS — Z23 Encounter for immunization: Secondary | ICD-10-CM

## 2020-05-15 MED ORDER — LISINOPRIL 20 MG PO TABS: 20.0000 mg | ORAL_TABLET | Freq: Every day | ORAL | 1 refills | Status: DC

## 2020-05-15 MED ORDER — METOPROLOL SUCCINATE ER 25 MG PO TB24: 25.0000 mg | ORAL_TABLET | Freq: Every day | ORAL | 1 refills | Status: DC

## 2020-05-15 NOTE — Addendum Note (Signed)
Addended by: Deveron Furlong D on: 05/15/2020 11:40 AM   Modules accepted: Orders

## 2020-05-16 LAB — CBC WITH DIFFERENTIAL/PLATELET
Absolute Monocytes: 861 cells/uL (ref 200–950)
Basophils Absolute: 32 cells/uL (ref 0–200)
Basophils Relative: 0.4 %
Eosinophils Absolute: 63 cells/uL (ref 15–500)
Eosinophils Relative: 0.8 %
HCT: 38.9 % (ref 38.5–50.0)
Hemoglobin: 13 g/dL — ABNORMAL LOW (ref 13.2–17.1)
Lymphs Abs: 1390 cells/uL (ref 850–3900)
MCH: 31 pg (ref 27.0–33.0)
MCHC: 33.4 g/dL (ref 32.0–36.0)
MCV: 92.8 fL (ref 80.0–100.0)
MPV: 11 fL (ref 7.5–12.5)
Monocytes Relative: 10.9 %
Neutro Abs: 5554 cells/uL (ref 1500–7800)
Neutrophils Relative %: 70.3 %
Platelets: 244 10*3/uL (ref 140–400)
RBC: 4.19 10*6/uL — ABNORMAL LOW (ref 4.20–5.80)
RDW: 12.4 % (ref 11.0–15.0)
Total Lymphocyte: 17.6 %
WBC: 7.9 10*3/uL (ref 3.8–10.8)

## 2020-05-16 LAB — LIPID PANEL
Cholesterol: 119 mg/dL (ref <200)
HDL: 45 mg/dL (ref >=40)
LDL Cholesterol (Calc): 60 mg/dL (calc)
Non-HDL Cholesterol (Calc): 74 mg/dL (calc) (ref <130)
Total CHOL/HDL Ratio: 2.6 (calc) (ref <5.0)
Triglycerides: 49 mg/dL (ref <150)

## 2020-05-16 LAB — COMPREHENSIVE METABOLIC PANEL
AG Ratio: 1.8 (calc) (ref 1.0–2.5)
ALT: 16 U/L (ref 9–46)
AST: 14 U/L (ref 10–35)
Albumin: 4.3 g/dL (ref 3.6–5.1)
Alkaline phosphatase (APISO): 114 U/L (ref 35–144)
BUN: 11 mg/dL (ref 7–25)
CO2: 31 mmol/L (ref 20–32)
Calcium: 9.3 mg/dL (ref 8.6–10.3)
Chloride: 101 mmol/L (ref 98–110)
Creat: 0.99 mg/dL (ref 0.70–1.25)
Globulin: 2.4 g/dL (calc) (ref 1.9–3.7)
Glucose, Bld: 91 mg/dL (ref 65–99)
Potassium: 3.8 mmol/L (ref 3.5–5.3)
Sodium: 142 mmol/L (ref 135–146)
Total Bilirubin: 1.1 mg/dL (ref 0.2–1.2)
Total Protein: 6.7 g/dL (ref 6.1–8.1)

## 2020-05-16 LAB — MICROALBUMIN / CREATININE URINE RATIO
Creatinine, Urine: 96 mg/dL (ref 20–320)
Microalb Creat Ratio: 5 mcg/mg creat (ref <30)
Microalb, Ur: 0.5 mg/dL

## 2020-05-16 LAB — TSH: TSH: 1.2 mIU/L (ref 0.40–4.50)

## 2020-05-16 LAB — PSA: PSA: 2.09 ng/mL (ref <=4.00)

## 2020-05-16 LAB — HEMOGLOBIN A1C
Hgb A1c MFr Bld: 5.9 % of total Hgb — ABNORMAL HIGH (ref <5.7)
Mean Plasma Glucose: 123 mg/dL
eAG (mmol/L): 6.8 mmol/L

## 2020-05-16 LAB — DIGOXIN LEVEL: Digoxin Level: <0.5 mcg/L — ABNORMAL LOW (ref 0.8–2.0)

## 2020-05-21 DIAGNOSIS — I10 Essential (primary) hypertension: Secondary | ICD-10-CM | POA: Diagnosis not present

## 2020-05-21 DIAGNOSIS — I482 Chronic atrial fibrillation, unspecified: Secondary | ICD-10-CM | POA: Diagnosis not present

## 2020-05-21 DIAGNOSIS — I340 Nonrheumatic mitral (valve) insufficiency: Secondary | ICD-10-CM | POA: Diagnosis not present

## 2020-05-21 DIAGNOSIS — Z8673 Personal history of transient ischemic attack (TIA), and cerebral infarction without residual deficits: Secondary | ICD-10-CM | POA: Diagnosis not present

## 2020-05-21 DIAGNOSIS — I428 Other cardiomyopathies: Secondary | ICD-10-CM | POA: Diagnosis not present

## 2020-06-29 ENCOUNTER — Other Ambulatory Visit: Payer: Self-pay | Admitting: Family Medicine

## 2020-07-05 ENCOUNTER — Other Ambulatory Visit: Payer: Self-pay | Admitting: Family Medicine

## 2020-07-05 DIAGNOSIS — Z79899 Other long term (current) drug therapy: Secondary | ICD-10-CM

## 2020-07-14 ENCOUNTER — Telehealth: Payer: Self-pay

## 2020-07-14 DIAGNOSIS — I4891 Unspecified atrial fibrillation: Secondary | ICD-10-CM | POA: Diagnosis not present

## 2020-07-14 DIAGNOSIS — I10 Essential (primary) hypertension: Secondary | ICD-10-CM | POA: Diagnosis not present

## 2020-07-14 DIAGNOSIS — M109 Gout, unspecified: Secondary | ICD-10-CM | POA: Diagnosis not present

## 2020-07-14 DIAGNOSIS — G8929 Other chronic pain: Secondary | ICD-10-CM | POA: Diagnosis not present

## 2020-07-14 DIAGNOSIS — E785 Hyperlipidemia, unspecified: Secondary | ICD-10-CM | POA: Diagnosis not present

## 2020-07-14 DIAGNOSIS — K219 Gastro-esophageal reflux disease without esophagitis: Secondary | ICD-10-CM | POA: Diagnosis not present

## 2020-07-14 DIAGNOSIS — I739 Peripheral vascular disease, unspecified: Secondary | ICD-10-CM | POA: Diagnosis not present

## 2020-07-14 DIAGNOSIS — G3184 Mild cognitive impairment, so stated: Secondary | ICD-10-CM | POA: Diagnosis not present

## 2020-07-14 DIAGNOSIS — Z008 Encounter for other general examination: Secondary | ICD-10-CM | POA: Diagnosis not present

## 2020-07-14 DIAGNOSIS — M1990 Unspecified osteoarthritis, unspecified site: Secondary | ICD-10-CM | POA: Diagnosis not present

## 2020-07-14 DIAGNOSIS — D6869 Other thrombophilia: Secondary | ICD-10-CM | POA: Diagnosis not present

## 2020-07-14 NOTE — Telephone Encounter (Signed)
Spoke with Hoyle Sauer NP from [facility] regarding PAD exam, his results read significant; L side 0.31 and R side 0.3.

## 2020-07-15 ENCOUNTER — Other Ambulatory Visit: Payer: Self-pay | Admitting: Family Medicine

## 2020-07-15 DIAGNOSIS — E7800 Pure hypercholesterolemia, unspecified: Secondary | ICD-10-CM

## 2020-07-29 ENCOUNTER — Telehealth: Payer: Self-pay | Admitting: Family Medicine

## 2020-07-29 NOTE — Telephone Encounter (Signed)
Left message for patient to schedule Annual Wellness Visit.  Please schedule with Nurse Health Advisor Leroy Kennedy, RN at [facility].

## 2020-08-15 ENCOUNTER — Other Ambulatory Visit: Payer: Self-pay | Admitting: Family Medicine

## 2020-09-02 ENCOUNTER — Other Ambulatory Visit: Payer: Self-pay | Admitting: Family Medicine

## 2020-10-12 DIAGNOSIS — I361 Nonrheumatic tricuspid (valve) insufficiency: Secondary | ICD-10-CM | POA: Diagnosis not present

## 2020-10-12 DIAGNOSIS — I4892 Unspecified atrial flutter: Secondary | ICD-10-CM | POA: Diagnosis not present

## 2020-10-12 DIAGNOSIS — I340 Nonrheumatic mitral (valve) insufficiency: Secondary | ICD-10-CM | POA: Diagnosis not present

## 2020-10-12 DIAGNOSIS — I10 Essential (primary) hypertension: Secondary | ICD-10-CM | POA: Diagnosis not present

## 2020-10-12 DIAGNOSIS — I428 Other cardiomyopathies: Secondary | ICD-10-CM | POA: Diagnosis not present

## 2020-10-12 DIAGNOSIS — I4891 Unspecified atrial fibrillation: Secondary | ICD-10-CM | POA: Diagnosis not present

## 2020-11-07 ENCOUNTER — Other Ambulatory Visit: Payer: Self-pay | Admitting: Family Medicine

## 2020-11-07 DIAGNOSIS — I10 Essential (primary) hypertension: Secondary | ICD-10-CM

## 2020-11-13 ENCOUNTER — Ambulatory Visit: Payer: Medicare HMO | Admitting: Family Medicine

## 2020-11-14 ENCOUNTER — Other Ambulatory Visit: Payer: Self-pay | Admitting: Family Medicine

## 2020-11-14 DIAGNOSIS — I10 Essential (primary) hypertension: Secondary | ICD-10-CM

## 2020-11-20 ENCOUNTER — Other Ambulatory Visit: Payer: Self-pay

## 2020-11-20 ENCOUNTER — Ambulatory Visit (INDEPENDENT_AMBULATORY_CARE_PROVIDER_SITE_OTHER): Payer: Medicare HMO | Admitting: Family Medicine

## 2020-11-20 ENCOUNTER — Encounter: Payer: Self-pay | Admitting: Family Medicine

## 2020-11-20 VITALS — BP 109/64 | HR 69 | Temp 99.0°F | Wt 182.4 lb

## 2020-11-20 DIAGNOSIS — Z23 Encounter for immunization: Secondary | ICD-10-CM | POA: Diagnosis not present

## 2020-11-20 DIAGNOSIS — M1611 Unilateral primary osteoarthritis, right hip: Secondary | ICD-10-CM

## 2020-11-20 DIAGNOSIS — I4819 Other persistent atrial fibrillation: Secondary | ICD-10-CM

## 2020-11-20 DIAGNOSIS — E7800 Pure hypercholesterolemia, unspecified: Secondary | ICD-10-CM

## 2020-11-20 DIAGNOSIS — E119 Type 2 diabetes mellitus without complications: Secondary | ICD-10-CM | POA: Diagnosis not present

## 2020-11-20 DIAGNOSIS — I10 Essential (primary) hypertension: Secondary | ICD-10-CM | POA: Diagnosis not present

## 2020-11-20 DIAGNOSIS — Z8673 Personal history of transient ischemic attack (TIA), and cerebral infarction without residual deficits: Secondary | ICD-10-CM

## 2020-11-20 MED ORDER — METOPROLOL SUCCINATE ER 25 MG PO TB24: 25.0000 mg | ORAL_TABLET | Freq: Every day | ORAL | 3 refills | Status: DC

## 2020-11-20 MED ORDER — XARELTO 20 MG PO TABS: ORAL_TABLET | ORAL | 3 refills | Status: DC

## 2020-11-20 MED ORDER — LISINOPRIL 20 MG PO TABS: 20.0000 mg | ORAL_TABLET | Freq: Every day | ORAL | 3 refills | Status: DC

## 2020-11-20 MED ORDER — ALLOPURINOL 100 MG PO TABS: 200.0000 mg | ORAL_TABLET | Freq: Every day | ORAL | 3 refills | Status: DC

## 2020-11-20 NOTE — Progress Notes (Signed)
OFFICE VISIT  11/20/2020  CC:  Chief Complaint  Patient presents with   Follow-up    6 month. RCI. Pt is    HPI:    Patient is a 60 y.o. male who presents for 6 mo f/u DM, HTN, HLD. A/P as of last visit: "1) DM 2: historically well controlled but he does not eat diabetic diet. Hba1c and urine microalb/cr today. Feet exam today.   2) HTN: controlled.  Cont toprol xl 25mg  qd. Lytes/cr today.   3) HLD: tolerating statin. FLP and hepatic panel today.   4) Health maintenance exam: Reviewed age and gender appropriate health maintenance issues (prudent diet, regular exercise, health risks of tobacco and excessive alcohol, use of seatbelts, fire alarms in home, use of sunscreen).  Also reviewed age and gender appropriate health screening as well as vaccine recommendations. Vaccines: Needs prevnar 20 then pneumococcal vaccines are complete-->given today.  Otherwise ALL UTD. Labs: fasting HP, Hba1c, urine microalb/cr, PSA. Prostate ca screening: PSA today. Colon ca screening: hx of adenoma, recall 07/2022.   5) Chronic atrial fibrillation:  Asymptomatic on low dose BB rate control and dig 0.125mg  qod. Followed by cardiology. NON-TROUGH dig level done today-->forward result to his cardiologist.   6) Chronic R hip pain d/t severe DJD: certainly needs hip replacement but his cardiac risk has been too high.  He'll get ongoing re-eval by his cardiologist.   7) L posterolat chest wall contusion: resolving.  Reassured."  INTERIM HX: All labs good at last visit. He is doing well feels fine. His limitation is his right hip pain and this causes him to have to use at least a cane when walking, lots of times a walker.  No falls.  He does not see an orthopedist.  He was told he needed hip replacement in the remote past.  He does have bone-on-bone DJD right hip, most recent x-ray in our system was 06/06/2018.  He has not been able to pursue surgery due to his other chronic medical problems plus his  fear of being put to sleep.  No home glucose or blood pressure monitoring.  He is compliant with statin without problem.  No problems with gout anymore since being on allopurinol.  10/12/20 cardiology f/u-->no changes made.  Pt asymptomatic from CV standpoint.  No new testing.  ROS as above, plus--> no fevers, no CP, no SOB, no wheezing, no cough, no dizziness, no HAs, no rashes, no melena/hematochezia.  No polyuria or polydipsia.  No myalgias. No focal weakness, paresthesias, or tremors.  No acute vision or hearing abnormalities.  No dysuria or unusual/new urinary urgency or frequency.  No recent changes in lower legs. No n/v/d or abd pain.  No palpitations.    Past Medical History:  Diagnosis Date   Alcohol abuse    ongoing as of 07/2017.  Quit after CVA 05/2018   Arthritis    Atrial fibrillation ([place])    Not candidate for anticoagulation b/c of GI bleeding; had to get transfused.  Then, pt had CVA 05/2018 and he was put on xarelto and he quit drinking.   COVID-19 virus infection 06/2019   CVA (cerebral vascular accident) ([place]) 05/2018   large R MCA territory infarct.  CTA neg for large cerebral vessel occlusion.  R ICA 70% stenosis, L clear. Rpt CTA neck ordered by neuro as of 01/2019.   Gastric ulcer    hx of--unclear (pt and wife deny this)   Gout    Usually MTP joint   History  of adenomatous polyp of colon 2021   recall 2024   History of digital clubbing    "all my adult life"--CXR 08/2016 = bronchitic changes o/w normal.   Hyperkalemia 04/2017   Normalized with decreasing lisinopril from 40 mg qd to 20 mg qd.   Hyperlipidemia 07/2016   Holding off on statin until pt quits drinking.  Back on statin after CVA 05/2018   Hypertension    Nonischemic cardiomyopathy ([place]) 2015; 2020   2015: "resolved" (w/medical therapy?).  05/2018 EF 35-40%, with TR and MR and ++LAE. 05/2019 EF 50-52%, low normal LV wall motion, grd II DD, mod MR and mod TR, mild pulm HTN.   Nonrheumatic mitral valve  regurgitation    Osteoarthritis of right hip    Scheduled for surgery 05/11/15 but he then declined to get the surgery.   Tobacco abuse    Quit 2014   Type II diabetes mellitus ([place]) 07/2016   Dx'd by two fasting glucoses > 126 (Hb a1c 6.2% at that time).      Past Surgical History:  Procedure Laterality Date   CARDIAC CATHETERIZATION  2015   Normal coronaries   CARDIOVERSION     DCCV 2015   COLONOSCOPY  08/09/2019   One large adenoma-->recall 3 yrs   TONSILLECTOMY     TRANSTHORACIC ECHOCARDIOGRAM  06/08/2018; 05/2019   05/2018 (in context of acute CVA): EF 35-40%, mild/mod MR, mod/sev TR, severely dilated LA.  05/2019 EF 50-52%, low normal LV wall motion, grd II DD, mod MR and mod TR, mild pulm HTN.    Outpatient Medications Prior to Visit  Medication Sig Dispense Refill   atorvastatin (LIPITOR) 40 MG tablet TAKE ONE TABLET BY MOUTH DAILY 90 tablet 1   digoxin (LANOXIN) 0.125 MG tablet TAKE ONE TABLET BY MOUTH DAILY (Patient taking differently: Every  other day) 30 tablet 5   folic acid (FOLVITE) 1 MG tablet TAKE ONE TABLET BY MOUTH DAILY 90 tablet 1   pantoprazole (PROTONIX) 40 MG tablet TAKE ONE TABLET BY MOUTH TWICE A DAY 180 tablet 1   allopurinol (ZYLOPRIM) 100 MG tablet TAKE TWO TABLETS BY MOUTH DAILY 180 tablet 0   lisinopril (ZESTRIL) 20 MG tablet Take 1 tablet (20 mg total) by mouth daily. 90 tablet 1   metoprolol succinate (TOPROL-XL) 25 MG 24 hr tablet Take 1 tablet (25 mg total) by mouth daily. 90 tablet 1   XARELTO 20 MG TABS tablet TAKE ONE TABLET BY MOUTH DAILY WITH SUPPER 90 tablet 0   No facility-administered medications prior to visit.    No Known Allergies  ROS As per HPI  PE: Vitals with BMI 11/20/2020 05/15/2020 09/26/2019  Height - 5\' 7"  -  Weight 182 lbs 6 oz 183 lbs 10 oz 173 lbs 6 oz  BMI - 28.78 -  Systolic 676 720 947  Diastolic 64 82 77  Pulse 69 73 65   Gen: Alert, well appearing.  Patient is oriented to person, place, time, and  situation. AFFECT: pleasant, lucid thought and speech. CV: irreg irreg, rate 70s, no m/r/g.   LUNGS: CTA bilat, nonlabored resps, good aeration in all lung fields. EXT: no clubbing or cyanosis.  no edema.  Neuro: CN 2-12 intact bilaterally, strength 5/5 in proximal and distal upper extremities and lower extremities bilaterally.  No sensory deficits.  No tremor.  No pronator drift.    LABS:  Lab Results  Component Value Date   TSH 1.20 05/15/2020   Lab Results  Component Value Date  DIGOXIN <0.5 (L) 05/15/2020   Lab Results  Component Value Date   WBC 7.9 05/15/2020   HGB 13.0 (L) 05/15/2020   HCT 38.9 05/15/2020   MCV 92.8 05/15/2020   PLT 244 05/15/2020   Lab Results  Component Value Date   CREATININE 0.99 05/15/2020   BUN 11 05/15/2020   NA 142 05/15/2020   K 3.8 05/15/2020   CL 101 05/15/2020   CO2 31 05/15/2020   Lab Results  Component Value Date   ALT 16 05/15/2020   AST 14 05/15/2020   ALKPHOS 81 06/29/2019   BILITOT 1.1 05/15/2020   Lab Results  Component Value Date   CHOL 119 05/15/2020   Lab Results  Component Value Date   HDL 45 05/15/2020   Lab Results  Component Value Date   LDLCALC 60 05/15/2020   Lab Results  Component Value Date   TRIG 49 05/15/2020   Lab Results  Component Value Date   CHOLHDL 2.6 05/15/2020   Lab Results  Component Value Date   PSA 2.09 05/15/2020   PSA 1.70 05/09/2019   PSA 1.90 07/31/2017   Lab Results  Component Value Date   HGBA1C 5.9 (H) 05/15/2020   IMPRESSION AND PLAN:  1)  diabetes.  Control has been good without medication.  He does eat what ever he wants. Fasting glucose and hemoglobin A1c today.  2.  Hypertension: Well-controlled on lisinopril 20 mg a day and Toprol-XL 25 mg a day. Electrolytes and creatinine checked today.  3.  Hyperlipidemia: Tolerating atorvastatin 40 mg a day.  LDL was 60 in May 2022.  Lipid panel and hepatic panel today.  4.  Debilitation due to severe right hip DJD.   No pain meds.  He is alters his activity level to avoid pain.  Doing well with cane and walker.  Hesitant to pursue surgery due to chronic medical problems, being on anticoagulant, and fear of anesthesia.  He has no orthopedist at this time.  An After Visit Summary was printed and given to the patient.  FOLLOW UP: Return in about 6 months (around 05/20/2021) for annual CPE (fasting).  Signed:  Crissie Sickles, MD           11/20/2020

## 2020-11-21 LAB — LIPID PANEL
Cholesterol: 127 mg/dL (ref <200)
HDL: 59 mg/dL (ref >=40)
LDL Cholesterol (Calc): 55 mg/dL (calc)
Non-HDL Cholesterol (Calc): 68 mg/dL (calc) (ref <130)
Total CHOL/HDL Ratio: 2.2 (calc) (ref <5.0)
Triglycerides: 51 mg/dL (ref <150)

## 2020-11-21 LAB — COMPREHENSIVE METABOLIC PANEL
AG Ratio: 1.9 (calc) (ref 1.0–2.5)
ALT: 14 U/L (ref 9–46)
AST: 15 U/L (ref 10–35)
Albumin: 4.6 g/dL (ref 3.6–5.1)
Alkaline phosphatase (APISO): 95 U/L (ref 35–144)
BUN: 13 mg/dL (ref 7–25)
CO2: 26 mmol/L (ref 20–32)
Calcium: 9.5 mg/dL (ref 8.6–10.3)
Chloride: 100 mmol/L (ref 98–110)
Creat: 0.91 mg/dL (ref 0.70–1.35)
Globulin: 2.4 g/dL (calc) (ref 1.9–3.7)
Glucose, Bld: 81 mg/dL (ref 65–99)
Potassium: 4 mmol/L (ref 3.5–5.3)
Sodium: 141 mmol/L (ref 135–146)
Total Bilirubin: 0.9 mg/dL (ref 0.2–1.2)
Total Protein: 7 g/dL (ref 6.1–8.1)

## 2020-11-21 LAB — HEMOGLOBIN A1C
Hgb A1c MFr Bld: 5.8 % of total Hgb — ABNORMAL HIGH (ref <5.7)
Mean Plasma Glucose: 120 mg/dL
eAG (mmol/L): 6.6 mmol/L

## 2020-12-25 ENCOUNTER — Other Ambulatory Visit: Payer: Self-pay

## 2020-12-25 MED ORDER — FOLIC ACID 1 MG PO TABS: 1.0000 mg | ORAL_TABLET | Freq: Every day | ORAL | 1 refills | Status: DC

## 2021-01-08 ENCOUNTER — Telehealth: Payer: Self-pay | Admitting: Family Medicine

## 2021-01-08 DIAGNOSIS — I10 Essential (primary) hypertension: Secondary | ICD-10-CM

## 2021-01-08 DIAGNOSIS — Z79899 Other long term (current) drug therapy: Secondary | ICD-10-CM

## 2021-01-08 MED ORDER — PANTOPRAZOLE SODIUM 40 MG PO TBEC: 40.0000 mg | DELAYED_RELEASE_TABLET | Freq: Two times a day (BID) | ORAL | 1 refills | Status: DC

## 2021-01-08 NOTE — Telephone Encounter (Signed)
Pt wife called needing refill for    pantoprazole pantoprazole (PROTONIX) 40 MG tablet   lisinopril lisinopril (ZESTRIL) 20 MG tablet   [facility] [id number] - [place], [place] Phone:  [phone]  Fax:  [phone]

## 2021-01-08 NOTE — Telephone Encounter (Signed)
Spoke with patient regarding results/recommendations.  

## 2021-01-08 NOTE — Telephone Encounter (Signed)
Pt wife called needing refill for   pantoprazole pantoprazole (PROTONIX) 40 MG tablet  lisinopril lisinopril (ZESTRIL) 20 MG tablet  [facility] [id number] - [place], [place] Phone:  [phone]  Fax:  [phone]

## 2021-01-31 ENCOUNTER — Other Ambulatory Visit: Payer: Self-pay | Admitting: Family Medicine

## 2021-01-31 DIAGNOSIS — E7800 Pure hypercholesterolemia, unspecified: Secondary | ICD-10-CM

## 2021-04-10 ENCOUNTER — Ambulatory Visit (INDEPENDENT_AMBULATORY_CARE_PROVIDER_SITE_OTHER): Payer: Medicare HMO

## 2021-04-10 DIAGNOSIS — Z122 Encounter for screening for malignant neoplasm of respiratory organs: Secondary | ICD-10-CM

## 2021-04-10 DIAGNOSIS — Z0000 Encounter for general adult medical examination without abnormal findings: Secondary | ICD-10-CM | POA: Diagnosis not present

## 2021-04-10 NOTE — Progress Notes (Signed)
? ?Subjective:  ? Robert [name] is a 61 y.o. male who presents for an Initial Medicare Annual Wellness Visit. I connected with  Robert [name] on 04/10/21 by a audio enabled telemedicine application and verified that I am speaking with the correct person using two identifiers. ? ?Patient Location: Home ? ?Provider Location: Home Office ? ?I discussed the limitations of evaluation and management by telemedicine. The patient expressed understanding and agreed to proceed. ? ? ?Review of Systems    ?Defer to PCP ?Cardiac Risk Factors include: advanced age (>73mn, >>41women);dyslipidemia;male gender;hypertension ? ?   ?Objective:  ?  ?There were no vitals filed for this visit. ?There is no height or weight on file to calculate BMI. ? ? ?  04/10/2021  ? 10:12 AM 06/20/2018  ?  4:20 PM 06/06/2018  ?  8:09 PM  ?Advanced Directives  ?Does Patient Have a Medical Advance Directive? No  No  ?Would patient like information on creating a medical advance directive? Yes (MAU/Ambulatory/Procedural Areas - Information given) No - Patient declined   ? ? ?Current Medications (verified) ?Outpatient Encounter Medications as of 04/10/2021  ?Medication Sig  ? allopurinol (ZYLOPRIM) 100 MG tablet Take 2 tablets (200 mg total) by mouth daily.  ? atorvastatin (LIPITOR) 40 MG tablet TAKE ONE TABLET BY MOUTH DAILY  ? digoxin (LANOXIN) 0.125 MG tablet TAKE ONE TABLET BY MOUTH DAILY (Patient taking differently: Every  other day)  ? folic acid (FOLVITE) 1 MG tablet Take 1 tablet (1 mg total) by mouth daily.  ? lisinopril (ZESTRIL) 20 MG tablet Take 1 tablet (20 mg total) by mouth daily.  ? metoprolol succinate (TOPROL-XL) 25 MG 24 hr tablet Take 1 tablet (25 mg total) by mouth daily.  ? pantoprazole (PROTONIX) 40 MG tablet Take 1 tablet (40 mg total) by mouth 2 (two) times daily.  ? XARELTO 20 MG TABS tablet TAKE ONE TABLET BY MOUTH DAILY WITH SUPPER  ? ?No facility-administered encounter medications on file as of 04/10/2021.  ? ? ?Allergies  (verified) ?Patient has no known allergies.  ? ?History: ?Past Medical History:  ?Diagnosis Date  ? Alcohol abuse   ? ongoing as of 07/2017.  Quit after CVA 05/2018  ? Arthritis   ? [place]   ? Not candidate for anticoagulation b/c of GI bleeding; had to get transfused.  Then, pt had CVA 05/2018 and he was put on xarelto and he quit drinking.  ? COVID-19 virus infection 06/2019  ? [place]  ? large R MCA territory infarct.  CTA neg for large cerebral vessel occlusion.  R ICA 70% stenosis, L clear. Rpt CTA neck ordered by neuro as of 01/2019.  ? Gastric ulcer   ? hx of--unclear (pt and wife deny this)  ? Gout   ? Usually MTP joint  ? History of adenomatous polyp of colon 2021  ? recall 2024  ? History of digital clubbing   ? "all my adult life"--CXR 08/2016 = bronchitic changes o/w normal.  ? Hyperkalemia 04/2017  ? Normalized with decreasing lisinopril from 40 mg qd to 20 mg qd.  ? Hyperlipidemia 07/2016  ? Holding off on statin until pt quits drinking.  Back on statin after CVA 05/2018  ? Hypertension   ? Nonischemic cardiomyopathy ([place] 2015; 2020  ? 2015: "resolved" (w/medical therapy?).  05/2018 EF 35-40%, with TR and MR and ++LAE. 05/2019 EF 50-52%, low normal LV wall motion, grd II DD, mod MR and mod TR, mild  pulm HTN.  ? Nonrheumatic mitral valve regurgitation   ? Osteoarthritis of right hip   ? Scheduled for surgery 05/11/15 but he then declined to get the surgery.  ? Tobacco abuse   ? Quit 2014  ? Type II diabetes mellitus ([place]) 07/2016  ? Dx'd by two fasting glucoses > 126 (Hb a1c 6.2% at that time).    ? ?Past Surgical History:  ?Procedure Laterality Date  ? CARDIAC CATHETERIZATION  2015  ? Normal coronaries  ? CARDIOVERSION    ? DCCV 2015  ? COLONOSCOPY  08/09/2019  ? One large adenoma-->recall 3 yrs  ? TONSILLECTOMY    ? TRANSTHORACIC ECHOCARDIOGRAM  06/08/2018; 05/2019  ? 05/2018 (in context of acute CVA): EF 35-40%, mild/mod MR, mod/sev TR, severely dilated  LA.  05/2019 EF 50-52%, low normal LV wall motion, grd II DD, mod MR and mod TR, mild pulm HTN.  ? ?Family History  ?Problem Relation Age of Onset  ? Diabetes Mother   ? Congestive Heart Failure Mother   ? Alcohol abuse Father   ? Hypertension Father   ? Stroke Father   ? Heart disease Paternal Grandfather   ? ?Social History  ? ?Socioeconomic History  ? Marital status: Married  ?  Spouse name: Not on file  ? Number of children: Not on file  ? Years of education: Not on file  ? Highest education level: Not on file  ?Occupational History  ? Not on file  ?Tobacco Use  ? Smoking status: Former  ?  Packs/day: 1.00  ?  Years: 5.00  ?  Pack years: 5.00  ?  Types: Cigarettes  ?  Quit date: 01/10/2013  ?  Years since quitting: 8.2  ? Smokeless tobacco: Never  ?Vaping Use  ? Vaping Use: Former  ?Substance and Sexual Activity  ? Alcohol use: Yes  ?  Comment: drinks liquor daily  ? Drug use: No  ? Sexual activity: Not on file  ?Other Topics Concern  ? Not on file  ?Social History Narrative  ? Married, 2 adult children.  ? Educ: 11th grade  ? Occup: "out of work".  Worked for [employer]--was let go for not abiding by [employer] per pt/wife.  ? Tob: former--quit 2014.  "Of and on prior".  ? Alc: long hx of drinking a pint and 1 and 1/2 pint of whiskey per day x years---quit approx May 2020..  ? Drugs: none.  ? ?Social Determinants of Health  ? ?Financial Resource Strain: Low Risk   ? Difficulty of Paying Living Expenses: Not hard at all  ?Food Insecurity: No Food Insecurity  ? Worried About Charity fundraiser in the Last Year: Never true  ? Ran Out of Food in the Last Year: Never true  ?Transportation Needs: No Transportation Needs  ? Lack of Transportation (Medical): No  ? Lack of Transportation (Non-Medical): No  ?Physical Activity: Inactive  ? Days of Exercise per Week: 0 days  ? Minutes of Exercise per Session: 0 min  ?Stress: No Stress Concern Present  ? Feeling of Stress : Not at all  ?Social Connections: Moderately  Isolated  ? Frequency of Communication with Friends and Family: More than three times a week  ? Frequency of Social Gatherings with Friends and Family: Once a week  ? Attends Religious Services: Never  ? Active Member of Clubs or Organizations: No  ? Attends Archivist Meetings: Never  ? Marital Status: Married  ? ? ?Tobacco Counseling ?Counseling  given: Not Answered ? ? ?Clinical Intake: ? ?Pre-visit preparation completed: Yes ? ?Pain : 0-10 ?Pain Type: Chronic pain ?Pain Location: Hip ?Pain Orientation: Right ?Pain Onset: More than a month ago ? ?  ? ?BMI - recorded: 24.25 ?Nutritional Status: BMI of 19-24  Normal ?Nutritional Risks: None ?Diabetes: No ? ?How often do you need to have someone help you when you read instructions, pamphlets, or other written materials from your doctor or pharmacy?: 2 - Rarely ?What is the last grade level you completed in school?: 11th ? ?Diabetic?No ? ?Interpreter Needed?: No ? ?Information entered by :: Toria C., RMA ? ? ?Activities of Daily Living ? ?  04/10/2021  ? 10:13 AM  ?In your present state of health, do you have any difficulty performing the following activities:  ?Hearing? 0  ?Vision? 0  ?Difficulty concentrating or making decisions? 1  ?Walking or climbing stairs? 1  ?Dressing or bathing? 1  ?Doing errands, shopping? 1  ?Preparing Food and eating ? Y  ?Using the Toilet? N  ?In the past six months, have you accidently leaked urine? Y  ?Do you have problems with loss of bowel control? N  ?Managing your Medications? Y  ?Managing your Finances? Y  ?Housekeeping or managing your Housekeeping? Y  ? ? ?Patient Care Team: ?McGowen, Adrian Blackwater, MD as PCP - General (Family Medicine) ?Powers, Elyse Jarvis, MD as Consulting Physician (Cardiology) ?Lucienne Capers, MD as Consulting Physician (Gastroenterology) ?Marina Goodell, MD as Consulting Physician (Cardiology) ?Domingo Cocking, Rex H, OD (Optometry) ? ?Indicate any recent Medical Services you may have received from other than  [facility] providers in the past year (date may be approximate). ? ?   ?Assessment:  ? This is a routine wellness examination for Leonard. ? ?Hearing/Vision screen ?No results found. ? ?Dietary issues and exercise activities

## 2021-04-19 ENCOUNTER — Ambulatory Visit (INDEPENDENT_AMBULATORY_CARE_PROVIDER_SITE_OTHER): Payer: Medicare HMO | Admitting: Family Medicine

## 2021-04-19 ENCOUNTER — Encounter: Payer: Self-pay | Admitting: Family Medicine

## 2021-04-19 VITALS — BP 134/74 | HR 59 | Temp 98.0°F | Ht 67.0 in | Wt 197.6 lb

## 2021-04-19 DIAGNOSIS — E7800 Pure hypercholesterolemia, unspecified: Secondary | ICD-10-CM

## 2021-04-19 DIAGNOSIS — Z79899 Other long term (current) drug therapy: Secondary | ICD-10-CM

## 2021-04-19 DIAGNOSIS — H9193 Unspecified hearing loss, bilateral: Secondary | ICD-10-CM | POA: Diagnosis not present

## 2021-04-19 DIAGNOSIS — I4891 Unspecified atrial fibrillation: Secondary | ICD-10-CM | POA: Diagnosis not present

## 2021-04-19 DIAGNOSIS — E119 Type 2 diabetes mellitus without complications: Secondary | ICD-10-CM | POA: Diagnosis not present

## 2021-04-19 DIAGNOSIS — M1611 Unilateral primary osteoarthritis, right hip: Secondary | ICD-10-CM

## 2021-04-19 LAB — MAGNESIUM: Magnesium: 1.7 mg/dL (ref 1.5–2.5)

## 2021-04-19 LAB — BASIC METABOLIC PANEL
BUN: 18 mg/dL (ref 6–23)
CO2: 32 mEq/L (ref 19–32)
Calcium: 9.4 mg/dL (ref 8.4–10.5)
Chloride: 99 mEq/L (ref 96–112)
Creatinine, Ser: 1.02 mg/dL (ref 0.40–1.50)
GFR: 79.43 mL/min (ref >60.00)
Glucose, Bld: 103 mg/dL — ABNORMAL HIGH (ref 70–99)
Potassium: 4.1 mEq/L (ref 3.5–5.1)
Sodium: 139 mEq/L (ref 135–145)

## 2021-04-19 LAB — HEMOGLOBIN A1C: Hgb A1c MFr Bld: 6.3 % (ref 4.6–6.5)

## 2021-04-19 MED ORDER — FOLIC ACID 1 MG PO TABS: 1.0000 mg | ORAL_TABLET | Freq: Every day | ORAL | 1 refills | Status: DC

## 2021-04-19 MED ORDER — PANTOPRAZOLE SODIUM 40 MG PO TBEC: 40.0000 mg | DELAYED_RELEASE_TABLET | Freq: Two times a day (BID) | ORAL | 1 refills | Status: DC

## 2021-04-19 MED ORDER — ATORVASTATIN CALCIUM 40 MG PO TABS: 40.0000 mg | ORAL_TABLET | Freq: Every day | ORAL | 1 refills | Status: DC

## 2021-04-19 NOTE — Addendum Note (Signed)
Addended by: Octaviano Glow on: 04/19/2021 11:54 AM ? ? Modules accepted: Orders ? ?

## 2021-04-19 NOTE — Progress Notes (Signed)
OFFICE VISIT ? ?04/19/2021 ? ?CC:  ?Chief Complaint  ?Patient presents with  ? Hip Pain  ?  Right,   ? ? ?Patient is a 61 y.o. male who presents for 5 mo follow-up chronic right hip pain, diabetes, hypertension, and hyperlipidemia. ?A/P as of last visit: ?"1)  diabetes.  Control has been good without medication.  He does eat what ever he wants. ?Fasting glucose and hemoglobin A1c today. ? ?2.  Hypertension: Well-controlled on lisinopril 20 mg a day and Toprol-XL 25 mg a day. ?Electrolytes and creatinine checked today. ? ?3.  Hyperlipidemia: Tolerating atorvastatin 40 mg a day.  LDL was 60 in May 2022.  Lipid panel and hepatic panel today. ? ?4.  Debilitation due to severe right hip DJD.  No pain meds.  He is alters his activity level to avoid pain.  Doing well with cane and walker.  Hesitant to pursue surgery due to chronic medical problems, being on anticoagulant, and fear of anesthesia.  He has no orthopedist at this time.". ? ?INTERIM HX: ?Doing well except for his chronic right hip pain.   ?He states he is now ready to see an orthopedic surgeon to consider surgery again. ?He has a known severe osteoarthritis of the right hip--bone-on-bone. ? ?No home blood pressure or glucose monitoring data today. ?He admits he continues to eat in an unhealthy manner.  He does not exercise any due to his chronic hip pain.  He has gained 15 pounds in the last 5 months. ?All labs were normal last visit. ? ? ?ROS as above, plus--> no fevers, no CP, no SOB, no wheezing, no cough, no dizziness, no HAs, no rashes, no melena/hematochezia.  No polyuria or polydipsia.  No myalgias.  No focal weakness, paresthesias, or tremors.  No acute vision or hearing abnormalities.  No dysuria or unusual/new urinary urgency or frequency.  No recent changes in lower legs. ?No n/v/d or abd pain.  No palpitations.   ? ?Past Medical History:  ?Diagnosis Date  ? Alcohol abuse   ? ongoing as of 07/2017.  Quit after CVA 05/2018  ? Arthritis   ? Atrial  fibrillation ([place])   ? Not candidate for anticoagulation b/c of GI bleeding; had to get transfused.  Then, pt had CVA 05/2018 and he was put on xarelto and he quit drinking.  ? COVID-19 virus infection 06/2019  ? CVA (cerebral vascular accident) ([place]) 05/2018  ? large R MCA territory infarct.  CTA neg for large cerebral vessel occlusion.  R ICA 70% stenosis, L clear. Rpt CTA neck ordered by neuro as of 01/2019.  ? Gastric ulcer   ? hx of--unclear (pt and wife deny this)  ? Gout   ? Usually MTP joint  ? History of adenomatous polyp of colon 2021  ? recall 2024  ? History of digital clubbing   ? "all my adult life"--CXR 08/2016 = bronchitic changes o/w normal.  ? Hyperkalemia 04/2017  ? Normalized with decreasing lisinopril from 40 mg qd to 20 mg qd.  ? Hyperlipidemia 07/2016  ? Holding off on statin until pt quits drinking.  Back on statin after CVA 05/2018  ? Hypertension   ? Nonischemic cardiomyopathy ([place]) 2015; 2020  ? 2015: "resolved" (w/medical therapy?).  05/2018 EF 35-40%, with TR and MR and ++LAE. 05/2019 EF 50-52%, low normal LV wall motion, grd II DD, mod MR and mod TR, mild pulm HTN.  ? Nonrheumatic mitral valve regurgitation   ? Osteoarthritis of right hip   ?  Scheduled for surgery 05/11/15 but he then declined to get the surgery.  ? Tobacco abuse   ? Quit 2014  ? Type II diabetes mellitus ([place]) 07/2016  ? Dx'd by two fasting glucoses > 126 (Hb a1c 6.2% at that time).    ? ? ?Past Surgical History:  ?Procedure Laterality Date  ? CARDIAC CATHETERIZATION  2015  ? Normal coronaries  ? CARDIOVERSION    ? DCCV 2015  ? COLONOSCOPY  08/09/2019  ? One large adenoma-->recall 3 yrs  ? TONSILLECTOMY    ? TRANSTHORACIC ECHOCARDIOGRAM  06/08/2018; 05/2019  ? 05/2018 (in context of acute CVA): EF 35-40%, mild/mod MR, mod/sev TR, severely dilated LA.  05/2019 EF 50-52%, low normal LV wall motion, grd II DD, mod MR and mod TR, mild pulm HTN.  ? ? ?Outpatient Medications Prior to Visit  ?Medication Sig Dispense Refill  ?  allopurinol (ZYLOPRIM) 100 MG tablet Take 2 tablets (200 mg total) by mouth daily. 180 tablet 3  ? digoxin (LANOXIN) 0.125 MG tablet TAKE ONE TABLET BY MOUTH DAILY (Patient taking differently: Every  other day) 30 tablet 5  ? lisinopril (ZESTRIL) 20 MG tablet Take 1 tablet (20 mg total) by mouth daily. 90 tablet 3  ? metoprolol succinate (TOPROL-XL) 25 MG 24 hr tablet Take 1 tablet (25 mg total) by mouth daily. 90 tablet 3  ? XARELTO 20 MG TABS tablet TAKE ONE TABLET BY MOUTH DAILY WITH SUPPER 90 tablet 3  ? atorvastatin (LIPITOR) 40 MG tablet TAKE ONE TABLET BY MOUTH DAILY 90 tablet 1  ? folic acid (FOLVITE) 1 MG tablet Take 1 tablet (1 mg total) by mouth daily. 90 tablet 1  ? pantoprazole (PROTONIX) 40 MG tablet Take 1 tablet (40 mg total) by mouth 2 (two) times daily. 180 tablet 1  ? ?No facility-administered medications prior to visit.  ? ? ?No Known Allergies ? ?ROS ?As per HPI ? ?PE: ? ?  04/19/2021  ? 10:22 AM 11/20/2020  ?  1:09 PM 05/15/2020  ?  9:36 AM  ?Vitals with BMI  ?Height '5\' 7"'$   '5\' 7"'$   ?Weight 197 lbs 10 oz 182 lbs 6 oz 183 lbs 10 oz  ?BMI 30.94  28.75  ?Systolic 263 785 885  ?Diastolic 74 64 82  ?Pulse 59 69 73  ? ? ? ?Physical Exam ? ?Gen: Alert, well appearing.  Patient is oriented to person, place, time, and situation. ?AFFECT: pleasant, lucid thought and speech. ?No further exam today. ? ?LABS:  ?Last CBC ?Lab Results  ?Component Value Date  ? WBC 7.9 05/15/2020  ? HGB 13.0 (L) 05/15/2020  ? HCT 38.9 05/15/2020  ? MCV 92.8 05/15/2020  ? MCH 31.0 05/15/2020  ? RDW 12.4 05/15/2020  ? PLT 244 05/15/2020  ? ?Last metabolic panel ?Lab Results  ?Component Value Date  ? GLUCOSE 81 11/20/2020  ? NA 141 11/20/2020  ? K 4.0 11/20/2020  ? CL 100 11/20/2020  ? CO2 26 11/20/2020  ? BUN 13 11/20/2020  ? CREATININE 0.91 11/20/2020  ? GFRNONAA >60 06/28/2019  ? CALCIUM 9.5 11/20/2020  ? PHOS 2.9 06/13/2018  ? PROT 7.0 11/20/2020  ? ALBUMIN 4.0 06/29/2019  ? BILITOT 0.9 11/20/2020  ? ALKPHOS 81 06/29/2019  ? AST  15 11/20/2020  ? ALT 14 11/20/2020  ? ANIONGAP 8 06/28/2019  ? ?Last hemoglobin A1c ?Lab Results  ?Component Value Date  ? HGBA1C 5.8 (H) 11/20/2020  ? ?Lab Results  ?Component Value Date  ? CHOL 127 11/20/2020  ?  HDL 59 11/20/2020  ? [place] 55 11/20/2020  ? TRIG 51 11/20/2020  ? CHOLHDL 2.2 11/20/2020  ? ?IMPRESSION AND PLAN: ? ?#1 type 2 diabetes.  No meds. ?Last A1c excellent--5.8%.  Diet is not excellent. ?Hemoglobin A1c today. ? ?#2 hypertension.  Well-controlled on lisinopril 20 mg a day and Toprol-XL 25 mg a day. ?Electrolytes and creatinine checked today. ? ?#3 hyperlipidemia.  Continue atorvastatin 40 mg a day. ?Last LDL was 55 about 5 months ago. ?Plan repeat lipids and hepatic panel in 6 months. ? ?#4 osteoarthritis right hip. ? He was told he needed hip replacement in the remote past.  He does have bone-on-bone DJD.  His most recent x-ray in our system was 06/06/2018.  He has not been able to pursue surgery due to his other chronic medical problems plus his fear of being put to sleep. ?He is now ready to readdress the possibility of surgery and has asked for referral to Dr. Tama High today. ? ?#5 hearing loss, chronic. ?He requests referral to audiology clinic so I ordered this today. ? ?An After Visit Summary was printed and given to the patient. ? ?FOLLOW UP: Return in about 6 months (around 10/19/2021) for annual CPE (fasting). ? ?Signed:  Crissie Sickles, MD           04/19/2021 ? ?

## 2021-04-27 ENCOUNTER — Ambulatory Visit (INDEPENDENT_AMBULATORY_CARE_PROVIDER_SITE_OTHER): Payer: Medicare HMO

## 2021-04-27 DIAGNOSIS — Z87891 Personal history of nicotine dependence: Secondary | ICD-10-CM | POA: Diagnosis not present

## 2021-04-27 DIAGNOSIS — Z122 Encounter for screening for malignant neoplasm of respiratory organs: Secondary | ICD-10-CM

## 2021-05-05 ENCOUNTER — Encounter: Payer: Self-pay | Admitting: Family Medicine

## 2021-05-11 DIAGNOSIS — H903 Sensorineural hearing loss, bilateral: Secondary | ICD-10-CM | POA: Diagnosis not present

## 2021-05-24 ENCOUNTER — Other Ambulatory Visit: Payer: Self-pay | Admitting: Family Medicine

## 2021-05-31 DIAGNOSIS — M25551 Pain in right hip: Secondary | ICD-10-CM | POA: Diagnosis not present

## 2021-05-31 DIAGNOSIS — M1611 Unilateral primary osteoarthritis, right hip: Secondary | ICD-10-CM | POA: Diagnosis not present

## 2021-06-04 DIAGNOSIS — I10 Essential (primary) hypertension: Secondary | ICD-10-CM | POA: Diagnosis not present

## 2021-06-04 DIAGNOSIS — I428 Other cardiomyopathies: Secondary | ICD-10-CM | POA: Diagnosis not present

## 2021-06-04 DIAGNOSIS — R040 Epistaxis: Secondary | ICD-10-CM | POA: Diagnosis not present

## 2021-06-04 DIAGNOSIS — I4892 Unspecified atrial flutter: Secondary | ICD-10-CM | POA: Diagnosis not present

## 2021-06-04 DIAGNOSIS — I4891 Unspecified atrial fibrillation: Secondary | ICD-10-CM | POA: Diagnosis not present

## 2021-06-04 DIAGNOSIS — I340 Nonrheumatic mitral (valve) insufficiency: Secondary | ICD-10-CM | POA: Diagnosis not present

## 2021-06-04 DIAGNOSIS — Z01818 Encounter for other preprocedural examination: Secondary | ICD-10-CM | POA: Diagnosis not present

## 2021-06-09 ENCOUNTER — Encounter: Payer: Self-pay | Admitting: Family Medicine

## 2021-06-10 ENCOUNTER — Telehealth: Payer: Self-pay

## 2021-06-10 NOTE — Telephone Encounter (Signed)
Form faxed

## 2021-06-10 NOTE — Telephone Encounter (Signed)
Signed and put in box to go up front. Signed:  Crissie Sickles, MD           06/10/2021

## 2021-06-10 NOTE — Telephone Encounter (Signed)
Placed surgical clearance on PCP desk to review and sign, if appropriate. Pt's last ov was 04/19/21 along with labs completed a1c, bmet and magnesium. Ov note along with recent labs printed

## 2021-07-06 DIAGNOSIS — I083 Combined rheumatic disorders of mitral, aortic and tricuspid valves: Secondary | ICD-10-CM | POA: Diagnosis not present

## 2021-07-06 DIAGNOSIS — I517 Cardiomegaly: Secondary | ICD-10-CM | POA: Diagnosis not present

## 2021-07-12 ENCOUNTER — Other Ambulatory Visit: Payer: Self-pay | Admitting: Orthopedic Surgery

## 2021-07-12 DIAGNOSIS — M25551 Pain in right hip: Secondary | ICD-10-CM

## 2021-07-16 ENCOUNTER — Ambulatory Visit
Admission: RE | Admit: 2021-07-16 | Discharge: 2021-07-16 | Disposition: A | Discharge status: Home / Self Care | Payer: Medicare HMO | Source: Ambulatory Visit | Attending: Orthopedic Surgery | Admitting: Orthopedic Surgery

## 2021-07-16 DIAGNOSIS — M25551 Pain in right hip: Secondary | ICD-10-CM

## 2021-07-16 DIAGNOSIS — Z01818 Encounter for other preprocedural examination: Secondary | ICD-10-CM | POA: Diagnosis not present

## 2021-07-16 DIAGNOSIS — M1611 Unilateral primary osteoarthritis, right hip: Secondary | ICD-10-CM | POA: Diagnosis not present

## 2021-07-21 NOTE — Patient Instructions (Signed)
DUE TO SPACE LIMITATIONS, ONLY TWO VISITORS  (aged 62 and older) ARE ALLOWED TO COME WITH YOU AND STAY IN THE WAITING ROOM DURING YOUR PRE OP AND PROCEDURE.   **NO VISITORS ARE ALLOWED IN THE SHORT STAY AREA OR RECOVERY ROOM!!**  IF YOU WILL BE ADMITTED INTO THE HOSPITAL YOU ARE ALLOWED ONLY FOUR SUPPORT PEOPLE DURING VISITATION HOURS (7 AM -8PM)   The support person(s) must pass our screening, and use Hand sanitizing gel. Visitors GUEST BADGE MUST BE WORN VISIBLY  One adult visitor may remain with you overnight and MUST be in the room by 8 P.M.   You are not required to quarantine at this time prior to your surgery. However, you must do this: Hand Hygiene often Do NOT share personal items Notify your provider if you are in close contact with someone who has COVID or you develop fever 100.4 or greater, new onset of sneezing, cough, sore throat, shortness of breath or body aches.       Your procedure is scheduled on:  Tuesday  August 03, 2021  Report to [facility] Main Entrance.  Report to admitting at: 10:30  AM  +++++Call this number if you have any questions or problems the morning of surgery [phone]  Do not eat food :After Midnight the night prior to your surgery/procedure.  After Midnight you may have the following liquids until  10:00 AM DAY OF SURGERY  Clear Liquid Diet Water Black Coffee (sugar ok, NO MILK/CREAM OR CREAMERS)  Tea (sugar ok, NO MILK/CREAM OR CREAMERS) regular and decaf                             Plain Jell-O (NO RED)                                           Fruit ices (not with fruit pulp, NO RED)                                     Popsicles (NO RED)                                                                  Juice: apple, WHITE grape, WHITE cranberry Sports drinks like Gatorade (NO RED) Clear broth(vegetable,chicken,beef)                    The day of surgery:  Drink ONE (1) Pre-Surgery G2 at   10:00 AM the morning of surgery.  Drink in one sitting. Do not sip.  This drink was given to you during your hospital  pre-op appointment visit. Nothing else to drink after completing the Pre-Surgery G2.   If you have questions, please contact your surgeon's office.   FOLLOW ANY ADDITIONAL PRE OP INSTRUCTIONS YOU RECEIVED FROM YOUR SURGEON'S OFFICE!!!   Oral Hygiene is also important to reduce your risk of infection.        Remember - BRUSH YOUR TEETH THE MORNING OF SURGERY WITH YOUR REGULAR TOOTHPASTE   Take ONLY  these medicines the morning of surgery with A SIP OF WATER:  Metoprolol (Toprol), Allopurinol, Digoxin (If correct day to take) and if needed, you make take Pantoprazole (Protonix)   These are anesthesia recommendations for holding your anticoagulants.  Please contact your prescribing physician to confirm IF it is safe to hold your anticoagulants for this length of time.  Xarelto Rivaroxaban   72 hours                     You may not have any metal on your body including jewelry, and body piercing  Do not wear lotions, powders, perfumes or deodorant  Men may shave face and neck.  Contacts, Hearing Aids, dentures or bridgework may not be worn into surgery.   You may bring a small overnight bag with you on the day of surgery, only pack items that are not valuable .[facility] IS NOT RESPONSIBLE   FOR VALUABLES THAT ARE LOST OR STOLEN.   DO NOT [place]. PHARMACY WILL DISPENSE MEDICATIONS LISTED ON YOUR MEDICATION LIST TO YOU DURING YOUR ADMISSION [place]!   Special Instructions: Bring a copy of your healthcare power of attorney and living will documents the day of surgery, if you wish to have them scanned into your [facility] Records- EPIC  Please read over the following fact sheets you were given: IF YOU HAVE QUESTIONS ABOUT YOUR PRE-OP INSTRUCTIONS, PLEASE CALL [phone]  ([place])   [place] - Preparing for Surgery Before surgery, you can play an  important role.  Because skin is not sterile, your skin needs to be as free of germs as possible.  You can reduce the number of germs on your skin by washing with CHG (chlorahexidine gluconate) soap before surgery.  CHG is an antiseptic cleaner which kills germs and bonds with the skin to continue killing germs even after washing. Please DO NOT use if you have an allergy to CHG or antibacterial soaps.  If your skin becomes reddened/irritated stop using the CHG and inform your nurse when you arrive at Short Stay. Do not shave (including legs and underarms) for at least 48 hours prior to the first CHG shower.  You may shave your face/neck.  Please follow these instructions carefully:  1.  Shower with CHG Soap the night before surgery and the  morning of surgery.  2.  If you choose to wash your hair, wash your hair first as usual with your normal  shampoo.  3.  After you shampoo, rinse your hair and body thoroughly to remove the shampoo.                             4.  Use CHG as you would any other liquid soap.  You can apply chg directly to the skin and wash.  Gently with a scrungie or clean washcloth.  5.  Apply the CHG Soap to your body ONLY FROM THE NECK DOWN.   Do not use on face/ open                           Wound or open sores. Avoid contact with eyes, ears mouth and genitals (private parts).                       Wash face,  Genitals (private parts) with your normal soap.  6.  Wash thoroughly, paying special attention to the area where your  surgery  will be performed.  7.  Thoroughly rinse your body with warm water from the neck down.  8.  DO NOT shower/wash with your normal soap after using and rinsing off the CHG Soap.            9.  Pat yourself dry with a clean towel.            10.  Wear clean pajamas.            11.  Place clean sheets on your bed the night of your first shower and do not  sleep with pets.  ON THE DAY OF SURGERY : Do not apply any lotions/deodorants the  morning of surgery.  Please wear clean clothes to the hospital/surgery center.    FAILURE TO FOLLOW THESE INSTRUCTIONS MAY RESULT IN THE CANCELLATION OF YOUR SURGERY  PATIENT SIGNATURE_________________________________  NURSE SIGNATURE__________________________________  ________________________________________________________________________     Adam Phenix    An incentive spirometer is a tool that can help keep your lungs clear and active. This tool measures how well you are filling your lungs with each breath. Taking long deep breaths may help reverse or decrease the chance of developing breathing (pulmonary) problems (especially infection) following: A long period of time when you are unable to move or be active. BEFORE THE PROCEDURE  If the spirometer includes an indicator to show your best effort, your nurse or respiratory therapist will set it to a desired goal. If possible, sit up straight or lean slightly forward. Try not to slouch. Hold the incentive spirometer in an upright position. INSTRUCTIONS FOR USE  Sit on the edge of your bed if possible, or sit up as far as you can in bed or on a chair. Hold the incentive spirometer in an upright position. Breathe out normally. Place the mouthpiece in your mouth and seal your lips tightly around it. Breathe in slowly and as deeply as possible, raising the piston or the ball toward the top of the column. Hold your breath for 3-5 seconds or for as long as possible. Allow the piston or ball to fall to the bottom of the column. Remove the mouthpiece from your mouth and breathe out normally. Rest for a few seconds and repeat Steps 1 through 7 at least 10 times every 1-2 hours when you are awake. Take your time and take a few normal breaths between deep breaths. The spirometer may include an indicator to show your best effort. Use the indicator as a goal to work toward during each repetition. After each set of 10 deep breaths,  practice coughing to be sure your lungs are clear. If you have an incision (the cut made at the time of surgery), support your incision when coughing by placing a pillow or rolled up towels firmly against it. Once you are able to get out of bed, walk around indoors and cough well. You may stop using the incentive spirometer when instructed by your caregiver.  RISKS AND COMPLICATIONS Take your time so you do not get dizzy or light-headed. If you are in pain, you may need to take or ask for pain medication before doing incentive spirometry. It is harder to take a deep breath if you are having pain. AFTER USE Rest and breathe slowly and easily. It can be helpful to keep track of a log of your progress. Your caregiver can provide you with a simple table to  help with this. If you are using the spirometer at home, follow these instructions: [place] IF:  You are having difficultly using the spirometer. You have trouble using the spirometer as often as instructed. Your pain medication is not giving enough relief while using the spirometer. You develop fever of 100.5 F (38.1 C) or higher.                                                                                                    SEEK IMMEDIATE MEDICAL CARE IF:  You cough up bloody sputum that had not been present before. You develop fever of 102 F (38.9 C) or greater. You develop worsening pain at or near the incision site. MAKE SURE YOU:  Understand these instructions. Will watch your condition. Will get help right away if you are not doing well or get worse. Document Released: 05/09/2006 Document Revised: 03/21/2011 Document Reviewed: 07/10/2006 [facility] Patient Information 2014 [place], [place].

## 2021-07-21 NOTE — Progress Notes (Addendum)
COVID Vaccine received:  '[x]'$  No '[]'$  Yes  Date of any COVID positive Test in last 90 days: none  PCP - Shawnie Dapper, MD   Medical Clearance in Chart.  Cardiologist - Marina Goodell, MD  [facility] cardiology- Jule Ser.   Cleared patient after his ECHO  Chest x-ray - 2020  CT Chest 04-27-2021  EPIC EKG -  06-28-19  Epic Stress Test -  ECHO - 07-06-21  CEW Cardiac Cath - 2015 Cardioversion (A.fib) - 2015  Pacemaker/ICD device last checked: Date:       '[x]'$  N/A Spinal Cord Stimulator:'[x]'$  No '[]'$  Yes   Other Implants:   History of Sleep Apnea? '[]'$  No '[x]'$  Yes  '[]'$  unknown Sleep Study Date:   CPAP used?- '[x]'$  No  PATIENT REFUSES CPAP  Does the patient monitor blood sugar? '[x]'$  No '[]'$  Yes  '[]'$  N/A  Blood Thinner Instructions:  Xarelto  Per Dr. Aurea Graff office, he is to stop 3 days prior to surgery.  Per Cardiology, patient says he is to stop Xarelto 2 days prior to surgery. I have asked him to clarify this when he is seen for his preop visit on Friday with Dr. Alvan Dame.    Activity level:  Can go up a flight of stairs and perform activities of daily living without stopping and       without symptoms of chest pain or shortness of breath.'[]'$  No '[]'$  Yes  '[x]'$  N/A  Able to do exercises without symptoms '[]'$  No '[]'$  Yes  '[x]'$  N/A  Patient able to complete ADLs without assistance '[]'$  No '[]'$  Yes  '[x]'$  N/A    Comments: Patient is HOH but no hearing aids  Anesthesia review: A.Fib, Hx CVA, NICM, MVR (mild on Echo)  Patient denies shortness of breath, fever, cough and chest pain at PAT appointment  Patient verbalized understanding and agreement to the Pre-Surgical Instructions that were given to them at this PAT appointment. Patient was also educated of the need to review these PAT instructions again prior to his/her surgery.I reviewed the appropriate phone numbers to call if they have any and questions or concerns.

## 2021-07-22 ENCOUNTER — Encounter (HOSPITAL_COMMUNITY)
Admission: RE | Admit: 2021-07-22 | Discharge: 2021-07-22 | Disposition: A | Discharge status: Home / Self Care | Payer: Medicare HMO | Source: Ambulatory Visit | Attending: Orthopedic Surgery | Admitting: Orthopedic Surgery

## 2021-07-22 ENCOUNTER — Encounter (HOSPITAL_COMMUNITY): Payer: Self-pay

## 2021-07-22 ENCOUNTER — Other Ambulatory Visit: Payer: Self-pay

## 2021-07-22 VITALS — BP 137/82 | HR 65 | Temp 98.4°F | Resp 20 | Ht 69.0 in | Wt 192.0 lb

## 2021-07-22 DIAGNOSIS — R7303 Prediabetes: Secondary | ICD-10-CM | POA: Diagnosis not present

## 2021-07-22 DIAGNOSIS — M1611 Unilateral primary osteoarthritis, right hip: Secondary | ICD-10-CM | POA: Insufficient documentation

## 2021-07-22 DIAGNOSIS — Z01812 Encounter for preprocedural laboratory examination: Secondary | ICD-10-CM | POA: Diagnosis present

## 2021-07-22 DIAGNOSIS — Z789 Other specified health status: Secondary | ICD-10-CM | POA: Diagnosis not present

## 2021-07-22 DIAGNOSIS — E119 Type 2 diabetes mellitus without complications: Secondary | ICD-10-CM

## 2021-07-22 DIAGNOSIS — Z01818 Encounter for other preprocedural examination: Secondary | ICD-10-CM

## 2021-07-22 HISTORY — DX: Paralytic syndrome, unspecified: G83.9

## 2021-07-22 HISTORY — DX: Sleep apnea, unspecified: G47.30

## 2021-07-22 HISTORY — DX: Prediabetes: R73.03

## 2021-07-22 LAB — SURGICAL PCR SCREEN
MRSA, PCR: NEGATIVE
Staphylococcus aureus: NEGATIVE

## 2021-07-22 LAB — TYPE AND SCREEN
ABO/RH(D): O POS
Antibody Screen: NEGATIVE

## 2021-07-22 LAB — COMPREHENSIVE METABOLIC PANEL
ALT: 15 U/L (ref 0–44)
AST: 17 U/L (ref 15–41)
Albumin: 4.6 g/dL (ref 3.5–5.0)
Alkaline Phosphatase: 79 U/L (ref 38–126)
Anion gap: 8 (ref 5–15)
BUN: 15 mg/dL (ref 8–23)
CO2: 28 mmol/L (ref 22–32)
Calcium: 9.3 mg/dL (ref 8.9–10.3)
Chloride: 104 mmol/L (ref 98–111)
Creatinine, Ser: 0.97 mg/dL (ref 0.61–1.24)
GFR, Estimated: >60 mL/min (ref >60)
Glucose, Bld: 103 mg/dL — ABNORMAL HIGH (ref 70–99)
Potassium: 4.3 mmol/L (ref 3.5–5.1)
Sodium: 140 mmol/L (ref 135–145)
Total Bilirubin: 1 mg/dL (ref 0.3–1.2)
Total Protein: 7.9 g/dL (ref 6.5–8.1)

## 2021-07-22 LAB — CBC
HCT: 43.1 % (ref 39.0–52.0)
Hemoglobin: 13.7 g/dL (ref 13.0–17.0)
MCH: 29.7 pg (ref 26.0–34.0)
MCHC: 31.8 g/dL (ref 30.0–36.0)
MCV: 93.5 fL (ref 80.0–100.0)
Platelets: 243 10*3/uL (ref 150–400)
RBC: 4.61 MIL/uL (ref 4.22–5.81)
RDW: 13.3 % (ref 11.5–15.5)
WBC: 5.8 10*3/uL (ref 4.0–10.5)
nRBC: 0 % (ref 0.0–0.2)

## 2021-07-22 LAB — HEMOGLOBIN A1C
Hgb A1c MFr Bld: 6 % — ABNORMAL HIGH (ref 4.8–5.6)
Mean Plasma Glucose: 125.5 mg/dL

## 2021-07-22 LAB — GLUCOSE, CAPILLARY: Glucose-Capillary: 96 mg/dL (ref 70–99)

## 2021-07-27 NOTE — Anesthesia Preprocedure Evaluation (Addendum)
Anesthesia Evaluation  Patient identified by MRN, date of birth, ID band Patient awake    Reviewed: Allergy & Precautions, H&P , NPO status , Patient's Chart, lab work & pertinent test results  Airway Mallampati: II   Neck ROM: full    Dental   Pulmonary sleep apnea , former smoker,    breath sounds clear to auscultation       Cardiovascular hypertension, + CAD and +CHF  + dysrhythmias Atrial Fibrillation  Rhythm:irregular Rate:Normal  H/o non-ischemic cardiomyopathy with prior EF in 30% range.  Cardiology clearance not in [place]. Recent TTE showing normal EF and mild MR.   Neuro/Psych CVA, Residual Symptoms    GI/Hepatic PUD, (+)     substance abuse  alcohol use,   Endo/Other  diabetes  Renal/GU      Musculoskeletal  (+) Arthritis ,   Abdominal   Peds  Hematology   Anesthesia Other Findings   Reproductive/Obstetrics                            Anesthesia Physical Anesthesia Plan  ASA: 3  Anesthesia Plan: Spinal and MAC   Post-op Pain Management:    Induction: Intravenous  PONV Risk Score and Plan: 1 and Propofol infusion, Ondansetron, Midazolam and Treatment may vary due to age or medical condition  Airway Management Planned: Simple Face Mask  Additional Equipment:   Intra-op Plan:   Post-operative Plan:   Informed Consent: I have reviewed the patients History and Physical, chart, labs and discussed the procedure including the risks, benefits and alternatives for the proposed anesthesia with the patient or authorized representative who has indicated his/her understanding and acceptance.     Dental advisory given  Plan Discussed with: CRNA, Anesthesiologist and Surgeon  Anesthesia Plan Comments: (See PAT note 07/22/2021)       Anesthesia Quick Evaluation

## 2021-07-27 NOTE — Progress Notes (Signed)
Anesthesia Chart Review   Case: [id number] Date/Time: 08/03/21 1240   Procedure: TOTAL HIP ARTHROPLASTY ANTERIOR APPROACH (Right: Hip)   Anesthesia type: Spinal   Pre-op diagnosis: Right hip osteoarthritis   Location: [place] 09 / [facility] ORS   Surgeons: Paralee Cancel, MD       DISCUSSION:61 y.o. former smoker with h/o HTN, atrial fibrillation on Xarelto, nonischemic cardiomyopathy, CVA, sleep apnea, right hip OA scheduled for above procedure 08/03/2021 with Dr. Paralee Cancel.   Pt last seen by cardiology 06/04/2021. Per OV note, "Preop evaluation prior to right hip surgery - patient denies chest pain, shortness of breath, palpitations.  He has very limited mobility due to right hip pain but he can walk up to 50 yards.  Patient has h/o severe NICMY with LVEF 25-30% since 2015, h/o stroke, permanent Afib. Previous cardiac echo with LV EF 50-52% from 2021 as above. Cardiac cath revealed normal coronaries from 2015 as above. I will repeat cardiac echo. If echo is unremarkable patient can proceed with right hip surgery-considering comorbidities patient has some risk of perioperative ischemic events but if echo is unremarkable the risk would not be high.  I would avoid fluctuation of the blood pressure or heart rate during surgery. I would keep potassium between 4.0 and 5.0 and magnesium between 2.0 and 2.5. "  Echo 07/06/2021 with EF 50-55%, mild mitral valve regurgitation.   Anticipate pt can proceed with planned procedure barring acute status change.   VS: BP 137/82   Pulse 65   Temp 36.9 C (Oral)   Resp 20   Ht '5\' 9"'$  (1.753 m)   Wt 87.1 kg   SpO2 98%   BMI 28.35 kg/m   PROVIDERS: [place], Adrian Blackwater, MD is PCP   Marina Goodell, MD is Cardiologist  LABS: Labs reviewed: Acceptable for surgery. (all labs ordered are listed, but only abnormal results are displayed)  Labs Reviewed  HEMOGLOBIN A1C - Abnormal; Notable for the following components:      Result Value   Hgb A1c MFr Bld 6.0  (*)    All other components within normal limits  COMPREHENSIVE METABOLIC PANEL - Abnormal; Notable for the following components:   Glucose, Bld 103 (*)    All other components within normal limits  SURGICAL PCR SCREEN  CBC  GLUCOSE, CAPILLARY  TYPE AND SCREEN     IMAGES:   EKG:   CV: Echo 07/06/2021 Mitral Valve: There is mild posterior annular calcification.    Mitral Valve: There is mild regurgitation with a centrally directed  jet. EROA 0.09cm2.    Aortic Valve: The aortic valve is tricuspid. The leaflets are mildly  thickened and exhibit normal excursion.    Right Atrium: Right atrium is mildly dilated.    Left Ventricle: Systolic function is low normal. EF: 50-55%.    Left Ventricle: Doppler parameters are indeterminate for diastolic  function.    Left Ventricle: There is mild concentric hypertrophy.    Left Ventricle: Wall motion is low normal.    Tricuspid Valve: The right ventricular systolic pressure is mildly  elevated - about 42 mmHg.   By previous study from May 2021 LVEF was 50 to 52%.  So since previous  study LV systolic function is not significant different. Past Medical History:  Diagnosis Date   Alcohol abuse    ongoing as of 07/2017.  Quit after CVA 05/2018   Arthritis    Atrial fibrillation ([place])    Not candidate for anticoagulation b/c of GI bleeding;  had to get transfused.  Then, pt had CVA 05/2018 and he was put on xarelto and he quit drinking.   Coronary atherosclerosis    Noted on lung cancer screening CT   COVID-19 virus infection 06/2019   CVA (cerebral vascular accident) ([place]) 05/2018   large R MCA territory infarct.  CTA neg for large cerebral vessel occlusion.  R ICA 70% stenosis, L clear. Rpt CTA neck ordered by neuro as of 01/2019.   Gastric ulcer    hx of--unclear (pt and wife deny this)   Gout    Usually MTP joint   History of adenomatous polyp of colon 2021   recall 2024   History of digital clubbing    "all my adult life"--CXR  08/2016 = bronchitic changes o/w normal.   Hyperkalemia 04/2017   Normalized with decreasing lisinopril from 40 mg qd to 20 mg qd.   Hyperlipidemia 07/2016   Holding off on statin until pt quits drinking.  Back on statin after CVA 05/2018   Hypertension    Nonischemic cardiomyopathy ([place]) 2015; 2020   2015: "resolved" (w/medical therapy?).  05/2018 EF 35-40%, with TR and MR and ++LAE. 05/2019 EF 50-52%, low normal LV wall motion, grd II DD, mod MR and mod TR, mild pulm HTN.   Nonrheumatic mitral valve regurgitation    Osteoarthritis of right hip    Surgery being planned as of 05/2021 consult with Dr. Alvan Dame   Paralysis [facility])    some left side deficit from CVA in 2020   Pre-diabetes    Screening for lung cancer    04/2021 CT showed no lesions/nodules.  Repeat 1 year   Sleep apnea    Tobacco abuse    Quit 2014    Past Surgical History:  Procedure Laterality Date   CARDIAC CATHETERIZATION  2015   Normal coronaries   CARDIOVERSION     DCCV 2015   COLONOSCOPY  08/09/2019   One large adenoma-->recall 3 yrs   TONSILLECTOMY     TRANSTHORACIC ECHOCARDIOGRAM  06/08/2018; 05/2019   05/2018 (in context of acute CVA): EF 35-40%, mild/mod MR, mod/sev TR, severely dilated LA.  05/2019 EF 50-52%, low normal LV wall motion, grd II DD, mod MR and mod TR, mild pulm HTN.    MEDICATIONS:  allopurinol (ZYLOPRIM) 100 MG tablet   atorvastatin (LIPITOR) 40 MG tablet   digoxin (LANOXIN) 2.500 MG tablet   folic acid (FOLVITE) 1 MG tablet   lisinopril (ZESTRIL) 20 MG tablet   metoprolol succinate (TOPROL-XL) 25 MG 24 hr tablet   pantoprazole (PROTONIX) 40 MG tablet   XARELTO 20 MG TABS tablet   No current facility-administered medications for this encounter.    Konrad Felix Ward, PA-C [facility] Pre-Surgical Testing [phone]

## 2021-08-03 ENCOUNTER — Encounter (HOSPITAL_COMMUNITY): Payer: Self-pay | Admitting: Orthopedic Surgery

## 2021-08-03 ENCOUNTER — Ambulatory Visit (HOSPITAL_COMMUNITY): Payer: Medicare HMO

## 2021-08-03 ENCOUNTER — Other Ambulatory Visit: Payer: Self-pay

## 2021-08-03 ENCOUNTER — Observation Stay (HOSPITAL_COMMUNITY)
Admission: RE | Admit: 2021-08-03 | Discharge: 2021-08-05 | Disposition: A | Discharge status: Home / Self Care | Payer: Medicare HMO | Attending: Orthopedic Surgery | Admitting: Orthopedic Surgery

## 2021-08-03 ENCOUNTER — Observation Stay (HOSPITAL_COMMUNITY): Payer: Medicare HMO

## 2021-08-03 ENCOUNTER — Ambulatory Visit (HOSPITAL_BASED_OUTPATIENT_CLINIC_OR_DEPARTMENT_OTHER): Payer: Medicare HMO | Admitting: Anesthesiology

## 2021-08-03 ENCOUNTER — Ambulatory Visit (HOSPITAL_COMMUNITY): Payer: Medicare HMO | Admitting: Physician Assistant

## 2021-08-03 ENCOUNTER — Encounter (HOSPITAL_COMMUNITY)
Admission: RE | Disposition: A | Discharge status: Home / Self Care | Payer: Self-pay | Source: Home / Self Care | Attending: Orthopedic Surgery

## 2021-08-03 DIAGNOSIS — Z8616 Personal history of COVID-19: Secondary | ICD-10-CM | POA: Insufficient documentation

## 2021-08-03 DIAGNOSIS — I509 Heart failure, unspecified: Secondary | ICD-10-CM

## 2021-08-03 DIAGNOSIS — Z79899 Other long term (current) drug therapy: Secondary | ICD-10-CM | POA: Diagnosis not present

## 2021-08-03 DIAGNOSIS — Z7901 Long term (current) use of anticoagulants: Secondary | ICD-10-CM | POA: Diagnosis not present

## 2021-08-03 DIAGNOSIS — R7303 Prediabetes: Secondary | ICD-10-CM

## 2021-08-03 DIAGNOSIS — I110 Hypertensive heart disease with heart failure: Secondary | ICD-10-CM | POA: Diagnosis not present

## 2021-08-03 DIAGNOSIS — I2510 Atherosclerotic heart disease of native coronary artery without angina pectoris: Secondary | ICD-10-CM | POA: Insufficient documentation

## 2021-08-03 DIAGNOSIS — M1611 Unilateral primary osteoarthritis, right hip: Secondary | ICD-10-CM | POA: Diagnosis not present

## 2021-08-03 DIAGNOSIS — E119 Type 2 diabetes mellitus without complications: Secondary | ICD-10-CM | POA: Diagnosis not present

## 2021-08-03 DIAGNOSIS — I10 Essential (primary) hypertension: Secondary | ICD-10-CM | POA: Diagnosis not present

## 2021-08-03 DIAGNOSIS — Z8673 Personal history of transient ischemic attack (TIA), and cerebral infarction without residual deficits: Secondary | ICD-10-CM | POA: Diagnosis not present

## 2021-08-03 DIAGNOSIS — Z471 Aftercare following joint replacement surgery: Secondary | ICD-10-CM | POA: Diagnosis not present

## 2021-08-03 DIAGNOSIS — Z96641 Presence of right artificial hip joint: Secondary | ICD-10-CM | POA: Diagnosis not present

## 2021-08-03 DIAGNOSIS — I4891 Unspecified atrial fibrillation: Secondary | ICD-10-CM | POA: Diagnosis not present

## 2021-08-03 DIAGNOSIS — Z87891 Personal history of nicotine dependence: Secondary | ICD-10-CM

## 2021-08-03 HISTORY — PX: TOTAL HIP ARTHROPLASTY: SHX124

## 2021-08-03 LAB — GLUCOSE, CAPILLARY
Glucose-Capillary: 106 mg/dL — ABNORMAL HIGH (ref 70–99)
Glucose-Capillary: 88 mg/dL (ref 70–99)

## 2021-08-03 SURGERY — ARTHROPLASTY, HIP, TOTAL, ANTERIOR APPROACH
Anesthesia: Monitor Anesthesia Care | Site: Hip | Laterality: Right

## 2021-08-03 MED ORDER — DEXAMETHASONE SODIUM PHOSPHATE 10 MG/ML IJ SOLN
8.0000 mg | Freq: Once | INTRAMUSCULAR | Status: AC
  Administered 2021-08-03: 8 mg via INTRAVENOUS

## 2021-08-03 MED ORDER — DIGOXIN 125 MCG PO TABS
0.1250 mg | ORAL_TABLET | ORAL | Status: DC
  Administered 2021-08-05: 0.125 mg via ORAL
  Filled 2021-08-03: qty 1

## 2021-08-03 MED ORDER — CEFAZOLIN SODIUM-DEXTROSE 2-4 GM/100ML-% IV SOLN
2.0000 g | INTRAVENOUS | Status: AC
  Administered 2021-08-03: 2 g via INTRAVENOUS
  Filled 2021-08-03: qty 100

## 2021-08-03 MED ORDER — LIDOCAINE HCL (PF) 2 % IJ SOLN
INTRAMUSCULAR | Status: AC
  Filled 2021-08-03: qty 5

## 2021-08-03 MED ORDER — ACETAMINOPHEN 325 MG PO TABS: 325.0000 mg | ORAL_TABLET | Freq: Four times a day (QID) | ORAL | Status: DC | PRN

## 2021-08-03 MED ORDER — PHENYLEPHRINE HCL (PRESSORS) 10 MG/ML IV SOLN
INTRAVENOUS | Status: AC
  Filled 2021-08-03: qty 1

## 2021-08-03 MED ORDER — CEFAZOLIN SODIUM-DEXTROSE 2-4 GM/100ML-% IV SOLN
2.0000 g | Freq: Four times a day (QID) | INTRAVENOUS | Status: AC
  Administered 2021-08-03 – 2021-08-04 (×2): 2 g via INTRAVENOUS
  Filled 2021-08-03 (×2): qty 100

## 2021-08-03 MED ORDER — FENTANYL CITRATE PF 50 MCG/ML IJ SOSY: 25.0000 ug | PREFILLED_SYRINGE | INTRAMUSCULAR | Status: DC | PRN

## 2021-08-03 MED ORDER — METOPROLOL SUCCINATE ER 25 MG PO TB24
25.0000 mg | ORAL_TABLET | Freq: Every day | ORAL | Status: DC
  Administered 2021-08-04 – 2021-08-05 (×2): 25 mg via ORAL
  Filled 2021-08-03 (×2): qty 1

## 2021-08-03 MED ORDER — METHOCARBAMOL 500 MG PO TABS
500.0000 mg | ORAL_TABLET | Freq: Four times a day (QID) | ORAL | Status: DC | PRN
  Administered 2021-08-03 – 2021-08-05 (×3): 500 mg via ORAL
  Filled 2021-08-03 (×3): qty 1

## 2021-08-03 MED ORDER — TRANEXAMIC ACID-NACL 1000-0.7 MG/100ML-% IV SOLN
1000.0000 mg | Freq: Once | INTRAVENOUS | Status: AC
  Administered 2021-08-03: 1000 mg via INTRAVENOUS
  Filled 2021-08-03: qty 100

## 2021-08-03 MED ORDER — ATORVASTATIN CALCIUM 40 MG PO TABS
40.0000 mg | ORAL_TABLET | Freq: Every day | ORAL | Status: DC
  Administered 2021-08-04 – 2021-08-05 (×2): 40 mg via ORAL
  Filled 2021-08-03 (×2): qty 1

## 2021-08-03 MED ORDER — DOCUSATE SODIUM 100 MG PO CAPS
100.0000 mg | ORAL_CAPSULE | Freq: Two times a day (BID) | ORAL | Status: DC
Start: 2021-08-03 — End: 2021-08-05
  Administered 2021-08-03 – 2021-08-04 (×3): 100 mg via ORAL
  Filled 2021-08-03 (×5): qty 1

## 2021-08-03 MED ORDER — PHENOL 1.4 % MT LIQD: 1.0000 | OROMUCOSAL | Status: DC | PRN

## 2021-08-03 MED ORDER — ORAL CARE MOUTH RINSE: 15.0000 mL | Freq: Once | OROMUCOSAL | Status: AC

## 2021-08-03 MED ORDER — LACTATED RINGERS IV SOLN: INTRAVENOUS | Status: DC

## 2021-08-03 MED ORDER — OXYCODONE HCL 5 MG PO TABS: 5.0000 mg | ORAL_TABLET | Freq: Once | ORAL | Status: DC | PRN

## 2021-08-03 MED ORDER — MORPHINE SULFATE (PF) 2 MG/ML IV SOLN: 0.5000 mg | INTRAVENOUS | Status: DC | PRN

## 2021-08-03 MED ORDER — HYDROCODONE-ACETAMINOPHEN 5-325 MG PO TABS
1.0000 | ORAL_TABLET | ORAL | Status: DC | PRN
  Administered 2021-08-03 – 2021-08-04 (×2): 2 via ORAL
  Filled 2021-08-03 (×2): qty 2

## 2021-08-03 MED ORDER — PANTOPRAZOLE SODIUM 40 MG PO TBEC
40.0000 mg | DELAYED_RELEASE_TABLET | Freq: Two times a day (BID) | ORAL | Status: DC
  Administered 2021-08-03 – 2021-08-05 (×4): 40 mg via ORAL
  Filled 2021-08-03 (×4): qty 1

## 2021-08-03 MED ORDER — DIPHENHYDRAMINE HCL 12.5 MG/5ML PO ELIX: 12.5000 mg | ORAL_SOLUTION | ORAL | Status: DC | PRN

## 2021-08-03 MED ORDER — PROPOFOL 500 MG/50ML IV EMUL
INTRAVENOUS | Status: AC
  Filled 2021-08-03: qty 50

## 2021-08-03 MED ORDER — DEXAMETHASONE SODIUM PHOSPHATE 10 MG/ML IJ SOLN
INTRAMUSCULAR | Status: AC
  Filled 2021-08-03: qty 1

## 2021-08-03 MED ORDER — GLYCOPYRROLATE 0.2 MG/ML IJ SOLN
INTRAMUSCULAR | Status: DC | PRN
  Administered 2021-08-03: .2 mg via INTRAVENOUS

## 2021-08-03 MED ORDER — PROPOFOL 1000 MG/100ML IV EMUL
INTRAVENOUS | Status: AC
  Filled 2021-08-03: qty 100

## 2021-08-03 MED ORDER — FOLIC ACID 1 MG PO TABS
1.0000 mg | ORAL_TABLET | Freq: Every day | ORAL | Status: DC
  Administered 2021-08-04 – 2021-08-05 (×2): 1 mg via ORAL
  Filled 2021-08-03 (×2): qty 1

## 2021-08-03 MED ORDER — METOCLOPRAMIDE HCL 5 MG/ML IJ SOLN: 5.0000 mg | Freq: Three times a day (TID) | INTRAMUSCULAR | Status: DC | PRN

## 2021-08-03 MED ORDER — RIVAROXABAN 10 MG PO TABS
20.0000 mg | ORAL_TABLET | Freq: Every day | ORAL | Status: DC
  Administered 2021-08-04: 20 mg via ORAL
  Filled 2021-08-03: qty 2

## 2021-08-03 MED ORDER — PHENYLEPHRINE HCL-NACL 20-0.9 MG/250ML-% IV SOLN
INTRAVENOUS | Status: DC | PRN
  Administered 2021-08-03: 40 ug/min via INTRAVENOUS

## 2021-08-03 MED ORDER — METHOCARBAMOL 500 MG IVPB - SIMPLE MED: 500.0000 mg | Freq: Four times a day (QID) | INTRAVENOUS | Status: DC | PRN

## 2021-08-03 MED ORDER — DEXMEDETOMIDINE (PRECEDEX) IN NS 20 MCG/5ML (4 MCG/ML) IV SYRINGE
PREFILLED_SYRINGE | INTRAVENOUS | Status: DC | PRN
  Administered 2021-08-03: 4 ug via INTRAVENOUS
  Administered 2021-08-03: 8 ug via INTRAVENOUS

## 2021-08-03 MED ORDER — BUPIVACAINE IN DEXTROSE 0.75-8.25 % IT SOLN
INTRATHECAL | Status: DC | PRN
  Administered 2021-08-03: 1.8 mL via INTRATHECAL

## 2021-08-03 MED ORDER — ONDANSETRON HCL 4 MG/2ML IJ SOLN: 4.0000 mg | Freq: Four times a day (QID) | INTRAMUSCULAR | Status: DC | PRN

## 2021-08-03 MED ORDER — BISACODYL 10 MG RE SUPP: 10.0000 mg | Freq: Every day | RECTAL | Status: DC | PRN

## 2021-08-03 MED ORDER — LISINOPRIL 20 MG PO TABS
20.0000 mg | ORAL_TABLET | Freq: Every day | ORAL | Status: DC
  Administered 2021-08-04: 20 mg via ORAL
  Filled 2021-08-03: qty 1

## 2021-08-03 MED ORDER — PROPOFOL 10 MG/ML IV BOLUS
INTRAVENOUS | Status: DC | PRN
  Administered 2021-08-03: 20 mg via INTRAVENOUS
  Administered 2021-08-03: 10 mg via INTRAVENOUS

## 2021-08-03 MED ORDER — POLYETHYLENE GLYCOL 3350 17 G PO PACK
17.0000 g | PACK | Freq: Every day | ORAL | Status: DC | PRN
  Filled 2021-08-03: qty 1

## 2021-08-03 MED ORDER — PROPOFOL 500 MG/50ML IV EMUL
INTRAVENOUS | Status: DC | PRN
  Administered 2021-08-03: 50 ug/kg/min via INTRAVENOUS

## 2021-08-03 MED ORDER — ONDANSETRON HCL 4 MG PO TABS: 4.0000 mg | ORAL_TABLET | Freq: Four times a day (QID) | ORAL | Status: DC | PRN

## 2021-08-03 MED ORDER — FENTANYL CITRATE (PF) 100 MCG/2ML IJ SOLN
INTRAMUSCULAR | Status: AC
  Filled 2021-08-03: qty 2

## 2021-08-03 MED ORDER — METOCLOPRAMIDE HCL 5 MG PO TABS: 5.0000 mg | ORAL_TABLET | Freq: Three times a day (TID) | ORAL | Status: DC | PRN

## 2021-08-03 MED ORDER — FERROUS SULFATE 325 (65 FE) MG PO TABS
325.0000 mg | ORAL_TABLET | Freq: Three times a day (TID) | ORAL | Status: DC
  Administered 2021-08-04 – 2021-08-05 (×5): 325 mg via ORAL
  Filled 2021-08-03 (×5): qty 1

## 2021-08-03 MED ORDER — MIDAZOLAM HCL 2 MG/2ML IJ SOLN
INTRAMUSCULAR | Status: AC
  Filled 2021-08-03: qty 2

## 2021-08-03 MED ORDER — GLYCOPYRROLATE 0.2 MG/ML IJ SOLN
INTRAMUSCULAR | Status: AC
  Filled 2021-08-03: qty 1

## 2021-08-03 MED ORDER — SODIUM CHLORIDE 0.9 % IV SOLN: INTRAVENOUS | Status: DC

## 2021-08-03 MED ORDER — MIDAZOLAM HCL 5 MG/5ML IJ SOLN
INTRAMUSCULAR | Status: DC | PRN
  Administered 2021-08-03 (×2): 1 mg via INTRAVENOUS

## 2021-08-03 MED ORDER — POVIDONE-IODINE 10 % EX SWAB
2.0000 | Freq: Once | CUTANEOUS | Status: AC
  Administered 2021-08-03: 2 via TOPICAL

## 2021-08-03 MED ORDER — FENTANYL CITRATE (PF) 100 MCG/2ML IJ SOLN
INTRAMUSCULAR | Status: DC | PRN
  Administered 2021-08-03: 50 ug via INTRAVENOUS
  Administered 2021-08-03 (×2): 25 ug via INTRAVENOUS

## 2021-08-03 MED ORDER — SODIUM CHLORIDE 0.9 % IR SOLN
Status: DC | PRN
  Administered 2021-08-03: 1000 mL

## 2021-08-03 MED ORDER — ALLOPURINOL 100 MG PO TABS
200.0000 mg | ORAL_TABLET | Freq: Every day | ORAL | Status: DC
  Administered 2021-08-04 – 2021-08-05 (×2): 200 mg via ORAL
  Filled 2021-08-03 (×2): qty 2

## 2021-08-03 MED ORDER — TRANEXAMIC ACID-NACL 1000-0.7 MG/100ML-% IV SOLN
1000.0000 mg | INTRAVENOUS | Status: AC
  Administered 2021-08-03: 1000 mg via INTRAVENOUS
  Filled 2021-08-03: qty 100

## 2021-08-03 MED ORDER — HYDROCODONE-ACETAMINOPHEN 7.5-325 MG PO TABS
1.0000 | ORAL_TABLET | ORAL | Status: DC | PRN
  Administered 2021-08-03 – 2021-08-04 (×2): 1 via ORAL
  Administered 2021-08-05 (×3): 2 via ORAL
  Filled 2021-08-03 (×2): qty 2
  Filled 2021-08-03 (×2): qty 1
  Filled 2021-08-03: qty 2

## 2021-08-03 MED ORDER — ONDANSETRON HCL 4 MG/2ML IJ SOLN
INTRAMUSCULAR | Status: AC
  Filled 2021-08-03: qty 2

## 2021-08-03 MED ORDER — DEXAMETHASONE SODIUM PHOSPHATE 10 MG/ML IJ SOLN
10.0000 mg | Freq: Once | INTRAMUSCULAR | Status: AC
  Administered 2021-08-04: 10 mg via INTRAVENOUS
  Filled 2021-08-03: qty 1

## 2021-08-03 MED ORDER — CHLORHEXIDINE GLUCONATE 0.12 % MT SOLN
15.0000 mL | Freq: Once | OROMUCOSAL | Status: AC
  Administered 2021-08-03: 15 mL via OROMUCOSAL

## 2021-08-03 MED ORDER — ONDANSETRON HCL 4 MG/2ML IJ SOLN
INTRAMUSCULAR | Status: DC | PRN
  Administered 2021-08-03: 4 mg via INTRAVENOUS

## 2021-08-03 MED ORDER — LIDOCAINE HCL (CARDIAC) PF 100 MG/5ML IV SOSY
PREFILLED_SYRINGE | INTRAVENOUS | Status: DC | PRN
  Administered 2021-08-03: 50 mg via INTRAVENOUS

## 2021-08-03 MED ORDER — OXYCODONE HCL 5 MG/5ML PO SOLN: 5.0000 mg | Freq: Once | ORAL | Status: DC | PRN

## 2021-08-03 MED ORDER — MENTHOL 3 MG MT LOZG
1.0000 | LOZENGE | OROMUCOSAL | Status: DC | PRN
Start: 2021-08-03 — End: 2021-08-05

## 2021-08-03 MED ORDER — PHENYLEPHRINE HCL-NACL 20-0.9 MG/250ML-% IV SOLN
INTRAVENOUS | Status: AC
  Filled 2021-08-03: qty 250

## 2021-08-03 SURGICAL SUPPLY — 44 items
BAG COUNTER SPONGE SURGICOUNT (BAG) ×1 IMPLANT
BAG DECANTER FOR FLEXI CONT (MISCELLANEOUS) IMPLANT
BAG ZIPLOCK 12X15 (MISCELLANEOUS) IMPLANT
BLADE SAG 18X100X1.27 (BLADE) ×2 IMPLANT
COVER PERINEAL POST (MISCELLANEOUS) ×2 IMPLANT
COVER SURGICAL LIGHT HANDLE (MISCELLANEOUS) ×2 IMPLANT
CUP ACET PNNCL SECTR W/GRIP 56 (Hips) IMPLANT
DERMABOND ADVANCED (GAUZE/BANDAGES/DRESSINGS) ×1
DERMABOND ADVANCED .7 DNX12 (GAUZE/BANDAGES/DRESSINGS) ×1 IMPLANT
DRAPE FOOT SWITCH (DRAPES) ×2 IMPLANT
DRAPE STERI IOBAN 125X83 (DRAPES) ×2 IMPLANT
DRAPE U-SHAPE 47X51 STRL (DRAPES) ×4 IMPLANT
DRESSING AQUACEL AG SP 3.5X10 (GAUZE/BANDAGES/DRESSINGS) ×1 IMPLANT
DRSG AQUACEL AG ADV 3.5X10 (GAUZE/BANDAGES/DRESSINGS) ×1 IMPLANT
DRSG AQUACEL AG SP 3.5X10 (GAUZE/BANDAGES/DRESSINGS) ×2
DURAPREP 26ML APPLICATOR (WOUND CARE) ×2 IMPLANT
ELECT REM PT RETURN 15FT ADLT (MISCELLANEOUS) ×2 IMPLANT
ELIMINATOR HOLE APEX DEPUY (Hips) ×1 IMPLANT
GLOVE BIO SURGEON STRL SZ 6 (GLOVE) ×2 IMPLANT
GLOVE BIOGEL PI IND STRL 6.5 (GLOVE) ×1 IMPLANT
GLOVE BIOGEL PI IND STRL 7.5 (GLOVE) ×1 IMPLANT
GLOVE BIOGEL PI INDICATOR 6.5 (GLOVE) ×1
GLOVE BIOGEL PI INDICATOR 7.5 (GLOVE) ×1
GLOVE ORTHO TXT STRL SZ7.5 (GLOVE) ×4 IMPLANT
GOWN STRL REUS W/ TWL LRG LVL3 (GOWN DISPOSABLE) ×3 IMPLANT
GOWN STRL REUS W/TWL LRG LVL3 (GOWN DISPOSABLE) ×3
HEAD CERAMIC DELTA 36 PLUS 1.5 (Hips) ×1 IMPLANT
HOLDER FOLEY CATH W/STRAP (MISCELLANEOUS) ×2 IMPLANT
KIT TURNOVER KIT A (KITS) IMPLANT
PACK ANTERIOR HIP CUSTOM (KITS) ×2 IMPLANT
PINN SECTOR W/GRIP ACE CUP 56 (Hips) ×2 IMPLANT
PINNACLE ALTRX PLUS 4 N 36X56 (Hips) ×1 IMPLANT
SCREW 6.5MMX25MM (Screw) ×1 IMPLANT
SLEEVE SUCTION 125 (MISCELLANEOUS) ×2 IMPLANT
STEM FEMORAL SZ9 HIGH ACTIS (Stem) ×1 IMPLANT
SUT MNCRL AB 4-0 PS2 18 (SUTURE) ×2 IMPLANT
SUT STRATAFIX 0 PDS 27 VIOLET (SUTURE) ×2
SUT VIC AB 1 CT1 36 (SUTURE) ×6 IMPLANT
SUT VIC AB 2-0 CT1 27 (SUTURE) ×2
SUT VIC AB 2-0 CT1 TAPERPNT 27 (SUTURE) ×2 IMPLANT
SUTURE STRATFX 0 PDS 27 VIOLET (SUTURE) ×1 IMPLANT
TRAY FOLEY MTR SLVR 16FR STAT (SET/KITS/TRAYS/PACK) ×1 IMPLANT
TUBE SUCTION HIGH CAP CLEAR NV (SUCTIONS) ×2 IMPLANT
WATER STERILE IRR 1000ML POUR (IV SOLUTION) ×2 IMPLANT

## 2021-08-03 NOTE — H&P (Signed)
TOTAL HIP ADMISSION H&P  Patient is admitted for right total hip arthroplasty.  Subjective:  Chief Complaint: right hip pain  HPI: Robert [name], 61 y.o. male, has a history of pain and functional disability in the right hip(s) due to arthritis and patient has failed non-surgical conservative treatments for greater than 12 weeks to include NSAID's and/or analgesics and activity modification.  Onset of symptoms was gradual starting 2 years ago with gradually worsening course since that time.The patient noted no past surgery on the right hip(s).  Patient currently rates pain in the right hip at 8 out of 10 with activity. Patient has worsening of pain with activity and weight bearing, pain that interfers with activities of daily living, and pain with passive range of motion. Patient has evidence of joint space narrowing by imaging studies. This condition presents safety issues increasing the risk of falls.  There is no current active infection.  Patient Active Problem List   Diagnosis Date Noted   Atrial fibrillation with RVR ([place]) 06/29/2019   General weakness 06/29/2019   Encounter for orogastric (OG) tube placement    AKI (acute kidney injury) ([place])    Acute idiopathic gout of right ankle    Leukocytosis    Oropharyngeal dysphagia    Family history of lower GI bleeding    Altered mental status    Endotracheal tube present    History of CVA with residual deficit 06/06/2018   Alcohol abuse 06/06/2018   Hyperglycemia 08/02/2016   Type II diabetes mellitus ([place]) 07/10/2016   Hyperlipidemia 03/19/2015   Hip arthritis 02/12/2015   Acute posthemorrhagic anemia 02/24/2013   Anemia 02/24/2013   Hypertension 02/24/2013   A-fib ([place]) 02/06/2013   Tricuspid regurgitation 02/06/2013   Non-ischemic cardiomyopathy ([place]) 02/06/2013   Febrile 02/05/2013   Nondependent alcohol abuse, continuous drinking behavior 02/05/2013   Past Medical History:  Diagnosis Date   Alcohol abuse    ongoing as of  07/2017.  Quit after CVA 05/2018   Arthritis    Atrial fibrillation ([place])    Not candidate for anticoagulation b/c of GI bleeding; had to get transfused.  Then, pt had CVA 05/2018 and he was put on xarelto and he quit drinking.   Coronary atherosclerosis    Noted on lung cancer screening CT   COVID-19 virus infection 06/2019   CVA (cerebral vascular accident) ([place]) 05/2018   large R MCA territory infarct.  CTA neg for large cerebral vessel occlusion.  R ICA 70% stenosis, L clear. Rpt CTA neck ordered by neuro as of 01/2019.   Gastric ulcer    hx of--unclear (pt and wife deny this)   Gout    Usually MTP joint   History of adenomatous polyp of colon 2021   recall 2024   History of digital clubbing    "all my adult life"--CXR 08/2016 = bronchitic changes o/w normal.   Hyperkalemia 04/2017   Normalized with decreasing lisinopril from 40 mg qd to 20 mg qd.   Hyperlipidemia 07/2016   Holding off on statin until pt quits drinking.  Back on statin after CVA 05/2018   Hypertension    Nonischemic cardiomyopathy ([place]) 2015; 2020   2015: "resolved" (w/medical therapy?).  05/2018 EF 35-40%, with TR and MR and ++LAE. 05/2019 EF 50-52%, low normal LV wall motion, grd II DD, mod MR and mod TR, mild pulm HTN.   Nonrheumatic mitral valve regurgitation    Osteoarthritis of right hip    Surgery being planned as of 05/2021 consult  with Dr. Alvan Dame   Paralysis [facility])    some left side deficit from CVA in 2020   Pre-diabetes    Screening for lung cancer    04/2021 CT showed no lesions/nodules.  Repeat 1 year   Sleep apnea    Tobacco abuse    Quit 2014    Past Surgical History:  Procedure Laterality Date   CARDIAC CATHETERIZATION  2015   Normal coronaries   CARDIOVERSION     DCCV 2015   COLONOSCOPY  08/09/2019   One large adenoma-->recall 3 yrs   TONSILLECTOMY     TRANSTHORACIC ECHOCARDIOGRAM  06/08/2018; 05/2019   05/2018 (in context of acute CVA): EF 35-40%, mild/mod MR, mod/sev TR, severely dilated LA.   05/2019 EF 50-52%, low normal LV wall motion, grd II DD, mod MR and mod TR, mild pulm HTN.    No current facility-administered medications for this encounter.   Current Outpatient Medications  Medication Sig Dispense Refill Last Dose   allopurinol (ZYLOPRIM) 100 MG tablet Take 2 tablets (200 mg total) by mouth daily. 180 tablet 3    atorvastatin (LIPITOR) 40 MG tablet Take 1 tablet (40 mg total) by mouth daily. 90 tablet 1    digoxin (LANOXIN) 0.125 MG tablet TAKE ONE TABLET BY MOUTH DAILY (Patient taking differently: Take 0.125 mg by mouth every other day.) 30 tablet 5    folic acid (FOLVITE) 1 MG tablet Take 1 tablet (1 mg total) by mouth daily. 90 tablet 1    lisinopril (ZESTRIL) 20 MG tablet Take 1 tablet (20 mg total) by mouth daily. 90 tablet 3    metoprolol succinate (TOPROL-XL) 25 MG 24 hr tablet Take 1 tablet (25 mg total) by mouth daily. 90 tablet 3    pantoprazole (PROTONIX) 40 MG tablet Take 1 tablet (40 mg total) by mouth 2 (two) times daily. 180 tablet 1    XARELTO 20 MG TABS tablet TAKE ONE TABLET BY MOUTH DAILY WITH SUPPER 90 tablet 3    No Known Allergies  Social History   Tobacco Use   Smoking status: Former    Packs/day: 1.00    Years: 5.00    Total pack years: 5.00    Types: Cigarettes    Quit date: 01/10/2013    Years since quitting: 8.5   Smokeless tobacco: Never  Substance Use Topics   Alcohol use: Not Currently    Comment: drinks liquor daily    Family History  Problem Relation Age of Onset   Diabetes Mother    Congestive Heart Failure Mother    Alcohol abuse Father    Hypertension Father    Stroke Father    Heart disease Paternal Grandfather      Review of Systems  Constitutional:  Negative for chills and fever.  Respiratory:  Negative for cough and shortness of breath.   Cardiovascular:  Negative for chest pain.  Gastrointestinal:  Negative for nausea and vomiting.  Musculoskeletal:  Positive for arthralgias.     Objective:  Physical  Exam Well nourished and well developed. General: Alert and oriented x3, cooperative and pleasant, no acute distress. Head: normocephalic, atraumatic, neck supple. Eyes: EOMI.  Musculoskeletal: Right hip exam: Significantly limited range of motion of his right hip with a obvious external rotation contracture with active hip flexion as well as identified on attempted passive range of motion He does demonstrate the technique in which he uses to put on shoes and socks by extending his hip and externally rotating it behind his other  leg to dress himself. Range of motion is painful and not stressed today No lower extremity edema, erythema or calf tenderness Noted residual weakness in his left upper and lower extremities at 5 -/5  Calves soft and nontender. Motor function intact in LE. Strength 5/5 LE bilaterally. Neuro: Distal pulses 2+. Sensation to light touch intact in LE.  Vital signs in last 24 hours:    Labs:   Estimated body mass index is 28.35 kg/m as calculated from the following:   Height as of 07/22/21: '5\' 9"'$  (1.753 m).   Weight as of 07/22/21: 87.1 kg.   Imaging Review Plain radiographs demonstrate severe degenerative joint disease of the right hip(s). The bone quality appears to be adequate for age and reported activity level.      Assessment/Plan:  End stage arthritis, right hip(s)  The patient history, physical examination, clinical judgement of the provider and imaging studies are consistent with end stage degenerative joint disease of the right hip(s) and total hip arthroplasty is deemed medically necessary. The treatment options including medical management, injection therapy, arthroscopy and arthroplasty were discussed at length. The risks and benefits of total hip arthroplasty were presented and reviewed. The risks due to aseptic loosening, infection, stiffness, dislocation/subluxation,  thromboembolic complications and other imponderables were discussed.  The  patient acknowledged the explanation, agreed to proceed with the plan and consent was signed. Patient is being admitted for inpatient treatment for surgery, pain control, PT, OT, prophylactic antibiotics, VTE prophylaxis, progressive ambulation and ADL's and discharge planning.The patient is planning to be discharged  home.  Therapy Plans: HEP Disposition: Home with brother Gwynneth Macleod) Planned DVT Prophylaxis: Xarelto 20 mg DME needed: none PCP: Dr. Anitra Lauth, clearance received Cardiologist: Dr. Geanie Berlin, clearance received (a fib & CVA) TXA: IV Allergies: NKDA Anesthesia Concerns: none BMI: 30.5 Last HgbA1c: 5.8%   Other: - hydrocodone, robaxin, tylenol - no NSAIDs Cr. 1 - Remote hx of alcohol abuse & CVA   Costella Hatcher, PA-C Orthopedic Surgery [facility] [place] [phone]

## 2021-08-03 NOTE — Transfer of Care (Signed)
Immediate Anesthesia Transfer of Care Note  Patient: Robert [name]  Procedure(s) Performed: TOTAL HIP ARTHROPLASTY ANTERIOR APPROACH (Right: Hip)  Patient Location: PACU  Anesthesia Type:Spinal  Level of Consciousness: awake  Airway & Oxygen Therapy: Patient Spontanous Breathing and Patient connected to face mask oxygen  Post-op Assessment: Report given to RN and Post -op Vital signs reviewed and stable  Post vital signs: Reviewed and stable  Last Vitals:  Vitals Value Taken Time  BP 107/65 08/03/21 1500  Temp    Pulse 88 08/03/21 1501  Resp 14 08/03/21 1502  SpO2 100 % 08/03/21 1501  Vitals shown include unvalidated device data.  Last Pain:  Vitals:   08/03/21 1050  TempSrc:   PainSc: 0-No pain         Complications: No notable events documented.

## 2021-08-03 NOTE — Evaluation (Signed)
Physical Therapy Evaluation Patient Details Name: Robert [name] MRN: [id number] DOB: 03-08-1960 Today's Date: 08/03/2021  History of Present Illness  Pt is a 61yo male presenting s/p R-THA, AA on 08/03/21. PMH: ETOH, OA, Afib, hx of CVA, gout, HLD, HTN, DM  Clinical Impression  Robert [name] is a 61 y.o. male POD 0 s/p R-THA,AA. Patient reports modified independence using SPC for mobility at baseline. Patient is now limited by functional impairments (see PT problem list below) and requires min assist for bed mobility and transfers, pt became dizzy during first steps of ambulation with RW so directed pt to sit in recliner, BP 108/70, further mobility deferred, RN in room. Patient instructed in exercise to facilitate ROM and circulation to manage edema. Provided incentive spirometer and with Vcs pt able to achieve 1797m. Patient will benefit from continued skilled PT interventions to address impairments and progress towards PLOF. Acute PT will follow to progress mobility and stair training in preparation for safe discharge home.       Recommendations for follow up therapy are one component of a multi-disciplinary discharge planning process, led by the attending physician.  Recommendations may be updated based on patient status, additional functional criteria and insurance authorization.  Follow Up Recommendations Follow physician's recommendations for discharge plan and follow up therapies      Assistance Recommended at Discharge Intermittent Supervision/Assistance  Patient can return home with the following  A little help with walking and/or transfers;A little help with bathing/dressing/bathroom;Assistance with cooking/housework;Assist for transportation;Help with stairs or ramp for entrance    Equipment Recommendations None recommended by PT  Recommendations for Other Services       Functional Status Assessment Patient has had a recent decline in their functional status and demonstrates the  ability to make significant improvements in function in a reasonable and predictable amount of time.     Precautions / Restrictions Precautions Precautions: Fall Restrictions Weight Bearing Restrictions: No RLE Weight Bearing: Weight bearing as tolerated Other Position/Activity Restrictions: wbat      Mobility  Bed Mobility Overal bed mobility: Needs Assistance Bed Mobility: Supine to Sit     Supine to sit: Min assist     General bed mobility comments: Min assist for trunk elevation    Transfers Overall transfer level: Needs assistance Equipment used: Rolling walker (2 wheels) Transfers: Sit to/from Stand, Bed to chair/wheelchair/BSC Sit to Stand: Min assist, From elevated surface   Step pivot transfers: Min guard       General transfer comment: Min assist for steadying of RW during transfer from elevated surface. Step pivot: min guard with max verbal cuing for step pivot transfer to recliner secondary to anxiety, pt reporting dizziness and flushing. Upon reaching recliner, BP taken in sitting 108/70 Hr 65. Pt symptom report improved with increased time sitting; encouraged pt to eat dinner, further mobility deferred, RN in room upon exit.    Ambulation/Gait               General Gait Details: deferred  Stairs            Wheelchair Mobility    Modified Rankin (Stroke Patients Only)       Balance Overall balance assessment: Needs assistance Sitting-balance support: Feet supported, No upper extremity supported Sitting balance-Leahy Scale: Fair     Standing balance support: Reliant on assistive device for balance, During functional activity, Bilateral upper extremity supported Standing balance-Leahy Scale: Poor  Pertinent Vitals/Pain Pain Assessment Pain Assessment: 0-10 Pain Score: 1  Pain Location: R hip Pain Descriptors / Indicators: Operative site guarding, Discomfort Pain Intervention(s): Limited  activity within patient's tolerance, Monitored during session, Repositioned, Ice applied    Home Living Family/patient expects to be discharged to:: Private residence Living Arrangements: Spouse/significant other;Children;Other relatives Available Help at Discharge: Family;Available 24 hours/day Type of Home: House Home Access: Stairs to enter Entrance Stairs-Rails: Right;Left;Can reach both Entrance Stairs-Number of Steps: 2   Home Layout: One level Home Equipment: Conservation officer, nature (2 wheels);Hand held shower head;BSC/3in1;Shower seat;Cane - single point;Grab bars - tub/shower      Prior Function Prior Level of Function : Independent/Modified Independent             Mobility Comments: SPC at all times ADLs Comments: uses adaptive equipment to dress self     Hand Dominance        Extremity/Trunk Assessment   Upper Extremity Assessment Upper Extremity Assessment: LUE deficits/detail LUE Deficits / Details: left-sided residual weakness post-CVA reported by pt LUE Sensation: WNL    Lower Extremity Assessment Lower Extremity Assessment: RLE deficits/detail;LLE deficits/detail RLE Deficits / Details: MMT ank DF/PF 5/5 RLE Sensation: WNL LLE Deficits / Details: MMT ank DF/PF 4/5 LLE Sensation: WNL    Cervical / Trunk Assessment Cervical / Trunk Assessment: Normal  Communication   Communication: Expressive difficulties (dysarthra)  Cognition Arousal/Alertness: Awake/alert Behavior During Therapy: Anxious Overall Cognitive Status: Within Functional Limits for tasks assessed                                 General Comments: Pt generally anxious about status, recovery, which AD he will use (repeated questions).        General Comments      Exercises Total Joint Exercises Ankle Circles/Pumps: AROM, Both, 10 reps   Assessment/Plan    PT Assessment Patient needs continued PT services  PT Problem List Decreased strength;Decreased range of  motion;Decreased activity tolerance;Decreased balance;Decreased mobility;Decreased coordination;Pain       PT Treatment Interventions DME instruction;Gait training;Stair training;Functional mobility training;Therapeutic activities;Therapeutic exercise;Balance training;Neuromuscular re-education;Patient/family education    PT Goals (Current goals can be found in the Care Plan section)  Acute Rehab PT Goals Patient Stated Goal: To walk without using any device PT Goal Formulation: With patient Time For Goal Achievement: 08/10/21 Potential to Achieve Goals: Good    Frequency 7X/week     Co-evaluation               AM-PAC PT "6 Clicks" Mobility  Outcome Measure Help needed turning from your back to your side while in a flat bed without using bedrails?: A Little Help needed moving from lying on your back to sitting on the side of a flat bed without using bedrails?: A Little Help needed moving to and from a bed to a chair (including a wheelchair)?: A Little Help needed standing up from a chair using your arms (e.g., wheelchair or bedside chair)?: A Little Help needed to walk in hospital room?: A Little Help needed climbing 3-5 steps with a railing? : A Lot 6 Click Score: 17    End of Session Equipment Utilized During Treatment: Gait belt Activity Tolerance: Treatment limited secondary to medical complications (Comment) (dizziness) Patient left: in chair;with call bell/phone within reach;with chair alarm set;with nursing/sitter in room;with SCD's reapplied Nurse Communication: Mobility status PT Visit Diagnosis: Pain;Difficulty in walking, not elsewhere classified (R26.2) Pain -  Right/Left: Right Pain - part of body: Hip    Time: 5615-3794 PT Time Calculation (min) (ACUTE ONLY): 32 min   Charges:   PT Evaluation $PT Eval Low Complexity: 1 Low PT Treatments $Therapeutic Activity: 8-22 mins        Coolidge Breeze, PT, DPT [facility] Rehabilitation Department Office:  [phone] Pager: [phone]  Coolidge Breeze 08/03/2021, 7:36 PM

## 2021-08-03 NOTE — Interval H&P Note (Signed)
History and Physical Interval Note:  08/03/2021 11:05 AM  Robert [name]  has presented today for surgery, with the diagnosis of Right hip osteoarthritis.  The various methods of treatment have been discussed with the patient and family. After consideration of risks, benefits and other options for treatment, the patient has consented to  Procedure(s): TOTAL HIP ARTHROPLASTY ANTERIOR APPROACH (Right) as a surgical intervention.  The patient's history has been reviewed, patient examined, no change in status, stable for surgery.  I have reviewed the patient's chart and labs.  Questions were answered to the patient's satisfaction.     Mauri Pole

## 2021-08-03 NOTE — Anesthesia Procedure Notes (Signed)
Spinal  Patient location during procedure: OR Start time: 08/03/2021 12:40 PM End time: 08/03/2021 12:45 PM Reason for block: surgical anesthesia Staffing Performed: anesthesiologist and resident/CRNA  Anesthesiologist: Albertha Ghee, MD Resident/CRNA: Garrel Ridgel, CRNA Performed by: Albertha Ghee, MD Authorized by: Albertha Ghee, MD   Preanesthetic Checklist Completed: patient identified, IV checked, risks and benefits discussed, surgical consent, monitors and equipment checked, pre-op evaluation and timeout performed Spinal Block Patient position: right lateral decubitus Prep: DuraPrep Patient monitoring: cardiac monitor, continuous pulse ox and blood pressure Approach: midline Location: L3-4 Injection technique: single-shot Needle Needle type: Pencan  Needle gauge: 24 G Needle length: 9 cm Assessment Sensory level: T10 Events: CSF return and second provider Additional Notes Functioning IV was confirmed and monitors were applied. Sterile prep and drape, including hand hygiene and sterile gloves were used. The patient was positioned and the spine was prepped. The skin was anesthetized with lidocaine.  Free flow of clear CSF was obtained prior to injecting local anesthetic into the CSF.  The spinal needle aspirated freely following injection.  The needle was carefully withdrawn.  The patient tolerated the procedure well.

## 2021-08-03 NOTE — Discharge Instructions (Signed)

## 2021-08-03 NOTE — Op Note (Signed)
NAME:  Robert [name]                ACCOUNT NO.: [id number]      MEDICAL RECORD NO.: [id number]      FACILITY:  [facility]      PHYSICIAN:  Mauri Pole  DATE OF BIRTH:  02-20-1960     DATE OF PROCEDURE:  08/03/2021                                 OPERATIVE REPORT         PREOPERATIVE DIAGNOSIS: Right  hip osteoarthritis.      POSTOPERATIVE DIAGNOSIS:  Right hip osteoarthritis.      PROCEDURE:  Right total hip replacement through an anterior approach   utilizing DePuy THR system, component size 54 mm Pinnacle Gription cup, a size 36+4 neutral   Altrex liner, a size 9 Hi Actis stem with a 36+1.5 delta ceramic   ball.      SURGEON:  Pietro Cassis. Alvan Dame, M.D.      ASSISTANT:  Costella Hatcher, PA-C     ANESTHESIA:  Spinal.      SPECIMENS:  None.      COMPLICATIONS:  None.      BLOOD LOSS:  500 cc     DRAINS:  None.      INDICATION OF THE PROCEDURE:  Robert [name] is a 61 y.o. male who had   presented to office for evaluation of right hip pain.  Radiographs revealed   progressive degenerative changes with bone-on-bone   articulation of the  hip joint, including subchondral cystic changes and osteophytes.  The patient had painful limited range of   motion significantly affecting their overall quality of life and function.  The patient was failing to    respond to conservative measures including medications and/or injections and activity modification and at this point was ready   to proceed with more definitive measures.  Consent was obtained for   benefit of pain relief.  Specific risks of infection, DVT, component   failure, dislocation, neurovascular injury, and need for revision surgery were reviewed in the office.     PROCEDURE IN DETAIL:  The patient was brought to operative theater.   Once adequate anesthesia, preoperative antibiotics, 2 gm of Ancef, 1 gm of Tranexamic Acid, and 10 mg of Decadron were administered, the patient was positioned supine on the  [employer] table.  Once the patient was safely positioned with adequate padding of boney prominences we predraped out the hip, and used fluoroscopy to confirm orientation of the pelvis.      The right hip was then prepped and draped from proximal iliac crest to   mid thigh with a shower curtain technique.      Time-out was performed identifying the patient, planned procedure, and the appropriate extremity.     An incision was then made 2 cm lateral to the   anterior superior iliac spine extending over the orientation of the   tensor fascia lata muscle and sharp dissection was carried down to the   fascia of the muscle.      The fascia was then incised.  The muscle belly was identified and swept   laterally and retractor placed along the superior neck.  Following   cauterization of the circumflex vessels and removing some pericapsular   fat, a second cobra retractor was placed on the inferior neck.  A T-capsulotomy was made along the line of the   superior neck to the trochanteric fossa, then extended proximally and   distally.  Tag sutures were placed and the retractors were then placed   intracapsular.  We then identified the trochanteric fossa and   orientation of my neck cut and then made a neck osteotomy with the femur on traction.  The femoral   head was removed without difficulty or complication.  Traction was let   off and retractors were placed posterior and anterior around the   acetabulum.      The labrum and foveal tissue were debrided.  I began reaming with a 47 mm   reamer and reamed up to 55 mm reamer with good bony bed preparation and a 56 mm  cup was chosen.  The final 56 mm Pinnacle Gription cup was then impacted under fluoroscopy to confirm the depth of penetration and orientation with respect to   Abduction and forward flexion.  A screw was placed into the ilium.  Following this I spent time removing significant peri-acetabular osteophytes.  Based on the condition of his  hip particularly the acetabulum I decide to place a trial liner first.  After trial reductions the final 36+4 neutral Altrex liner was impacted with good visualized rim fit.  The cup was positioned anatomically within the acetabular portion of the pelvis.      At this point, the femur was rolled to 100 degrees.  Further capsule was   released off the inferior aspect of the femoral neck.  I then   released the superior capsule proximally.  With the leg in a neutral position the hook was placed laterally   along the femur under the vastus lateralis origin and elevated manually and then held in position using the hook attachment on the bed.  The leg was then extended and adducted with the leg rolled to 100   degrees of external rotation.  Retractors were placed along the medial calcar and posteriorly over the greater trochanter.  Once the proximal femur was fully   exposed, I used a box osteotome to set orientation.  I then began   broaching with the starting chili pepper broach and passed this by hand and then broached up to 9.  With the 9 broach in place I chose a high offset neck and did several trial reductions.  The offset was appropriate, leg lengths   appeared to be equal best matched with the +1.5 head ball trial confirmed radiographically.   Given these findings, I went ahead and dislocated the hip, repositioned all   retractors and positioned the right hip in the extended and abducted position.  The final 9 Hi Actis stem was   chosen and it was impacted down to the level of neck cut.  Based on this   and the trial reductions, a final 36+1.5 delta ceramic ball was chosen and   impacted onto a clean and dry trunnion, and the hip was reduced.  The   hip had been irrigated throughout the case again at this point.  I did   reapproximate the superior capsular leaflet to the anterior leaflet   using #1 Vicryl.  The fascia of the   tensor fascia lata muscle was then reapproximated using #1 Vicryl  and #0 Stratafix sutures.  The   remaining wound was closed with 2-0 Vicryl and running 4-0 Monocryl.   The hip was cleaned, dried, and dressed sterilely using Dermabond and  Aquacel dressing.  The patient was then brought   to recovery room in stable condition tolerating the procedure well.    Costella Hatcher, PA-C was present for the entirety of the case involved from   preoperative positioning, perioperative retractor management, general   facilitation of the case, as well as primary wound closure as assistant.            Pietro Cassis Alvan Dame, M.D.        08/03/2021 12:29 PM

## 2021-08-04 ENCOUNTER — Encounter (HOSPITAL_COMMUNITY): Payer: Self-pay | Admitting: Orthopedic Surgery

## 2021-08-04 DIAGNOSIS — Z8616 Personal history of COVID-19: Secondary | ICD-10-CM | POA: Diagnosis not present

## 2021-08-04 DIAGNOSIS — I10 Essential (primary) hypertension: Secondary | ICD-10-CM | POA: Diagnosis not present

## 2021-08-04 DIAGNOSIS — I4891 Unspecified atrial fibrillation: Secondary | ICD-10-CM | POA: Diagnosis not present

## 2021-08-04 DIAGNOSIS — Z7901 Long term (current) use of anticoagulants: Secondary | ICD-10-CM | POA: Diagnosis not present

## 2021-08-04 DIAGNOSIS — Z8673 Personal history of transient ischemic attack (TIA), and cerebral infarction without residual deficits: Secondary | ICD-10-CM | POA: Diagnosis not present

## 2021-08-04 DIAGNOSIS — M1611 Unilateral primary osteoarthritis, right hip: Secondary | ICD-10-CM | POA: Diagnosis not present

## 2021-08-04 DIAGNOSIS — I2510 Atherosclerotic heart disease of native coronary artery without angina pectoris: Secondary | ICD-10-CM | POA: Diagnosis not present

## 2021-08-04 DIAGNOSIS — M9689 Other intraoperative and postprocedural complications and disorders of the musculoskeletal system: Secondary | ICD-10-CM | POA: Diagnosis not present

## 2021-08-04 DIAGNOSIS — Z87891 Personal history of nicotine dependence: Secondary | ICD-10-CM | POA: Diagnosis not present

## 2021-08-04 DIAGNOSIS — E119 Type 2 diabetes mellitus without complications: Secondary | ICD-10-CM | POA: Diagnosis not present

## 2021-08-04 DIAGNOSIS — Z79899 Other long term (current) drug therapy: Secondary | ICD-10-CM | POA: Diagnosis not present

## 2021-08-04 LAB — CBC
HCT: 33.9 % — ABNORMAL LOW (ref 39.0–52.0)
Hemoglobin: 10.7 g/dL — ABNORMAL LOW (ref 13.0–17.0)
MCH: 29.6 pg (ref 26.0–34.0)
MCHC: 31.6 g/dL (ref 30.0–36.0)
MCV: 93.9 fL (ref 80.0–100.0)
Platelets: 198 10*3/uL (ref 150–400)
RBC: 3.61 MIL/uL — ABNORMAL LOW (ref 4.22–5.81)
RDW: 13 % (ref 11.5–15.5)
WBC: 11.9 10*3/uL — ABNORMAL HIGH (ref 4.0–10.5)
nRBC: 0 % (ref 0.0–0.2)

## 2021-08-04 LAB — BASIC METABOLIC PANEL
Anion gap: 10 (ref 5–15)
BUN: 16 mg/dL (ref 8–23)
CO2: 27 mmol/L (ref 22–32)
Calcium: 8.9 mg/dL (ref 8.9–10.3)
Chloride: 100 mmol/L (ref 98–111)
Creatinine, Ser: 1.02 mg/dL (ref 0.61–1.24)
GFR, Estimated: >60 mL/min (ref >60)
Glucose, Bld: 176 mg/dL — ABNORMAL HIGH (ref 70–99)
Potassium: 4.1 mmol/L (ref 3.5–5.1)
Sodium: 137 mmol/L (ref 135–145)

## 2021-08-04 MED ORDER — SODIUM CHLORIDE 0.9 % IV BOLUS
250.0000 mL | Freq: Once | INTRAVENOUS | Status: AC
  Administered 2021-08-04: 250 mL via INTRAVENOUS

## 2021-08-04 NOTE — Progress Notes (Signed)
Physical Therapy Treatment Patient Details Name: Robert [name] MRN: [id number] DOB: July 30, 1960 Today's Date: 08/04/2021   History of Present Illness Pt is a 61yo male presenting s/p R-THA, AA on 08/03/21. PMH: ETOH, OA, Afib, hx of CVA, gout, HLD, HTN, DM    PT Comments    Pt is progressing well with mobility, he tolerated increased ambulation distance of 180' with RW without loss of balance. Reviewed THA HEP. Noted plan for radiation to R hip prior to DC home. Will plan to do stair training tomorrow.     Recommendations for follow up therapy are one component of a multi-disciplinary discharge planning process, led by the attending physician.  Recommendations may be updated based on patient status, additional functional criteria and insurance authorization.  Follow Up Recommendations  Follow physician's recommendations for discharge plan and follow up therapies     Assistance Recommended at Discharge Intermittent Supervision/Assistance  Patient can return home with the following A little help with walking and/or transfers;A little help with bathing/dressing/bathroom;Assistance with cooking/housework;Assist for transportation;Help with stairs or ramp for entrance   Equipment Recommendations  None recommended by PT    Recommendations for Other Services       Precautions / Restrictions Precautions Precautions: Fall Restrictions Weight Bearing Restrictions: No RLE Weight Bearing: Weight bearing as tolerated Other Position/Activity Restrictions: wbat     Mobility  Bed Mobility Overal bed mobility: Needs Assistance Bed Mobility: Supine to Sit     Supine to sit: Supervision     General bed mobility comments: VCs for use of gait belt as leg lifter    Transfers Overall transfer level: Needs assistance Equipment used: Rolling walker (2 wheels) Transfers: Sit to/from Stand Sit to Stand: Min guard, From elevated surface           General transfer comment: VCs hand  placement    Ambulation/Gait Ambulation/Gait assistance: Min guard Gait Distance (Feet): 180 Feet Assistive device: Rolling walker (2 wheels) Gait Pattern/deviations: Step-to pattern, Trunk flexed Gait velocity: decr     General Gait Details: VCs posture and positioning in RW, pt reports he had 2 inch leg length discrepancy prior to surgery and walked with flexed trunk at baseline   Stairs             Wheelchair Mobility    Modified Rankin (Stroke Patients Only)       Balance Overall balance assessment: Needs assistance Sitting-balance support: Feet supported, No upper extremity supported Sitting balance-Leahy Scale: Fair     Standing balance support: Reliant on assistive device for balance, During functional activity, Bilateral upper extremity supported Standing balance-Leahy Scale: Poor                              Cognition Arousal/Alertness: Awake/alert Behavior During Therapy: WFL for tasks assessed/performed Overall Cognitive Status: Within Functional Limits for tasks assessed                                          Exercises Total Joint Exercises Ankle Circles/Pumps: AROM, Both, 10 reps Quad Sets: AROM, Right, 5 reps, Supine Short Arc Quad: AROM, Right, 10 reps, Supine Heel Slides: AAROM, Right, 10 reps, Supine Hip ABduction/ADduction: AAROM, Right, 10 reps, Supine Long Arc Quad: AROM, Right, Seated, 20 reps    General Comments        Pertinent Vitals/Pain Pain Assessment Pain  Score: 4  Pain Location: R hip with walking Pain Descriptors / Indicators: Operative site guarding, Discomfort Pain Intervention(s): Limited activity within patient's tolerance, Monitored during session, Premedicated before session, Ice applied, Repositioned    Home Living                          Prior Function            PT Goals (current goals can now be found in the care plan section) Acute Rehab PT Goals Patient Stated  Goal: To walk without using any device PT Goal Formulation: With patient Time For Goal Achievement: 08/10/21 Potential to Achieve Goals: Good Progress towards PT goals: Progressing toward goals    Frequency    7X/week      PT Plan Current plan remains appropriate    Co-evaluation              AM-PAC PT "6 Clicks" Mobility   Outcome Measure  Help needed turning from your back to your side while in a flat bed without using bedrails?: A Little Help needed moving from lying on your back to sitting on the side of a flat bed without using bedrails?: A Little Help needed moving to and from a bed to a chair (including a wheelchair)?: A Little Help needed standing up from a chair using your arms (e.g., wheelchair or bedside chair)?: A Little Help needed to walk in hospital room?: A Little Help needed climbing 3-5 steps with a railing? : A Lot 6 Click Score: 17    End of Session Equipment Utilized During Treatment: Gait belt Activity Tolerance: Patient tolerated treatment well Patient left: with call bell/phone within reach;in bed;with bed alarm set Nurse Communication: Mobility status PT Visit Diagnosis: Pain;Difficulty in walking, not elsewhere classified (R26.2) Pain - Right/Left: Right Pain - part of body: Hip     Time: 0093-8182 PT Time Calculation (min) (ACUTE ONLY): 26 min  Charges:  $Gait Training: 8-22 mins $Therapeutic Exercise: 8-22 mins                     Blondell Reveal Kistler PT 08/04/2021  Acute Rehabilitation Services  Office [phone]

## 2021-08-04 NOTE — Progress Notes (Signed)
Physical Therapy Treatment Patient Details Name: Robert [name] MRN: [id number] DOB: 1960-04-04 Today's Date: 08/04/2021   History of Present Illness Pt is a 61yo male presenting s/p R-THA, AA on 08/03/21. PMH: ETOH, OA, Afib, hx of CVA, gout, HLD, HTN, DM    PT Comments    Pt is progressing well with mobility, he ambulated 150' with RW. Initiated THA HEP. Noted pt is to have radiation tx to R hip prior to DC home.    Recommendations for follow up therapy are one component of a multi-disciplinary discharge planning process, led by the attending physician.  Recommendations may be updated based on patient status, additional functional criteria and insurance authorization.  Follow Up Recommendations  Follow physician's recommendations for discharge plan and follow up therapies     Assistance Recommended at Discharge Intermittent Supervision/Assistance  Patient can return home with the following A little help with walking and/or transfers;A little help with bathing/dressing/bathroom;Assistance with cooking/housework;Assist for transportation;Help with stairs or ramp for entrance   Equipment Recommendations  None recommended by PT    Recommendations for Other Services       Precautions / Restrictions Precautions Precautions: Fall Restrictions Weight Bearing Restrictions: No RLE Weight Bearing: Weight bearing as tolerated Other Position/Activity Restrictions: wbat     Mobility  Bed Mobility Overal bed mobility: Needs Assistance Bed Mobility: Supine to Sit     Supine to sit: Min assist     General bed mobility comments: Min assist for trunk elevation    Transfers Overall transfer level: Needs assistance Equipment used: Rolling walker (2 wheels) Transfers: Sit to/from Stand Sit to Stand: Min guard, From elevated surface           General transfer comment: VCs hand placement    Ambulation/Gait Ambulation/Gait assistance: Min guard Gait Distance (Feet): 150  Feet Assistive device: Rolling walker (2 wheels) Gait Pattern/deviations: Step-to pattern, Trunk flexed Gait velocity: decr     General Gait Details: VCs posture and positioning in RW, pt reports he had 2 inch leg length discrepancy prior to surgery and walked with flexed trunk at baseline   Stairs             Wheelchair Mobility    Modified Rankin (Stroke Patients Only)       Balance Overall balance assessment: Needs assistance Sitting-balance support: Feet supported, No upper extremity supported Sitting balance-Leahy Scale: Fair     Standing balance support: Reliant on assistive device for balance, During functional activity, Bilateral upper extremity supported Standing balance-Leahy Scale: Poor                              Cognition Arousal/Alertness: Awake/alert Behavior During Therapy: WFL for tasks assessed/performed Overall Cognitive Status: Within Functional Limits for tasks assessed                                          Exercises Total Joint Exercises Ankle Circles/Pumps: AROM, Both, 10 reps Short Arc Quad: AROM, Right, 10 reps, Supine Heel Slides: AAROM, Right, 10 reps, Supine Hip ABduction/ADduction: AAROM, Right, 10 reps, Supine Long Arc Quad: AROM, Right, 10 reps, Seated    General Comments        Pertinent Vitals/Pain Pain Assessment Pain Score: 3  Pain Location: R hip Pain Descriptors / Indicators: Operative site guarding, Discomfort Pain Intervention(s): Limited activity within patient's tolerance, Monitored  during session, Premedicated before session, Ice applied    Home Living                          Prior Function            PT Goals (current goals can now be found in the care plan section) Acute Rehab PT Goals Patient Stated Goal: To walk without using any device PT Goal Formulation: With patient Time For Goal Achievement: 08/10/21 Potential to Achieve Goals: Good Progress towards  PT goals: Progressing toward goals    Frequency    7X/week      PT Plan Current plan remains appropriate    Co-evaluation              AM-PAC PT "6 Clicks" Mobility   Outcome Measure  Help needed turning from your back to your side while in a flat bed without using bedrails?: A Little Help needed moving from lying on your back to sitting on the side of a flat bed without using bedrails?: A Little Help needed moving to and from a bed to a chair (including a wheelchair)?: A Little Help needed standing up from a chair using your arms (e.g., wheelchair or bedside chair)?: A Little Help needed to walk in hospital room?: A Little Help needed climbing 3-5 steps with a railing? : A Lot 6 Click Score: 17    End of Session Equipment Utilized During Treatment: Gait belt Activity Tolerance: Patient tolerated treatment well Patient left: in chair;with chair alarm set;with call bell/phone within reach Nurse Communication: Mobility status PT Visit Diagnosis: Pain;Difficulty in walking, not elsewhere classified (R26.2) Pain - Right/Left: Right Pain - part of body: Hip     Time: 7903-8333 PT Time Calculation (min) (ACUTE ONLY): 42 min  Charges:  $Gait Training: 8-22 mins $Therapeutic Exercise: 8-22 mins $Therapeutic Activity: 8-22 mins                     Blondell Reveal Kistler PT 08/04/2021  Acute Rehabilitation Services  Office [phone]

## 2021-08-04 NOTE — Progress Notes (Signed)
Patient ID: Robert [name], male   DOB: 1960-05-06, 61 y.o.   MRN: [id number] Subjective: 1 Day Post-Op Procedure(s) (LRB): TOTAL HIP ARTHROPLASTY ANTERIOR APPROACH (Right)    Patient reports pain as mild to moderate. No events noted  Objective:   VITALS:   Vitals:   08/04/21 0337 08/04/21 0603  BP: 104/70 99/61  Pulse: 66 71  Resp: 17 17  Temp: 98.7 F (37.1 C) 98.1 F (36.7 C)  SpO2: 98% 100%    Neurovascular intact Incision: dressing C/D/I - right hip Moves RLE well  LABS Recent Labs    08/04/21 0346  HGB 10.7*  HCT 33.9*  WBC 11.9*  PLT 198    Recent Labs    08/04/21 0346  NA 137  K 4.1  BUN 16  CREATININE 1.02  GLUCOSE 176*    No results for input(s): "LABPT", "INR" in the last 72 hours.   Assessment/Plan: 1 Day Post-Op Procedure(s) (LRB): TOTAL HIP ARTHROPLASTY ANTERIOR APPROACH (Right)   Advance diet Up with therapy - WBAT  Due to significant peri-articular osteophytes and the effort to remove them he is at increased risk to develop HO Will consult Radiation Oncology to assist with single XRT to the right hip prior to discharge Anticipate discharge tomorrow due to these arrangements as well as the significant challenges associated with the procedure itself
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
## 2021-08-04 NOTE — Consult Note (Signed)
Radiation Oncology         [phone] ________________________________  Name: Robert [name] MRN: [id number]  Date of Service: 08/04/21 DOB: Jan 06, 1961  CC:McGowen, Adrian Blackwater, MD  No ref. provider found     REFERRING PHYSICIAN: No ref. provider found   DIAGNOSIS: Right Hip Osteoarthritis with high risks of postoperative heterotopic ossification  HISTORY OF PRESENT ILLNESS:Robert [name] is a 61 y.o. male who is seen for an initial consultation visit regarding risks of developing postoperative heterotopic ossification. He has had debilitating right hip osteoarthritis and was seen by Dr. Alvan Dame to consider joint replacement. He was taken to the OR yesterday for total right hip arthroplasty and at the time of surgery, significant peri-articular osteophytes were encountered.  With this surgical finding, the patient is felt to be at significant risk for the development of heterotopic ossification which could limit range of motion and create similar symptoms as he had prior to surgery. We have therefore been asked to see the patient today for consideration of postoperative radiation treatment for the prevention of heterotopic ossification postoperatively.    PREVIOUS RADIATION THERAPY: No   PAST MEDICAL HISTORY:  has a past medical history of Alcohol abuse, Arthritis, Atrial fibrillation ([place]), Coronary atherosclerosis, COVID-19 virus infection (06/2019), CVA (cerebral vascular accident) ([place]) (05/2018), Gastric ulcer, Gout, History of adenomatous polyp of colon (2021), History of digital clubbing, Hyperkalemia (04/2017), Hyperlipidemia (07/2016), Hypertension, Nonischemic cardiomyopathy ([place]) (2015; 2020), Nonrheumatic mitral valve regurgitation, Osteoarthritis of right hip, Paralysis ([place]), Pre-diabetes, Screening for lung cancer, Sleep apnea, and Tobacco abuse.     PAST SURGICAL HISTORY: Past Surgical History:  Procedure Laterality Date   CARDIAC CATHETERIZATION  2015   Normal coronaries    CARDIOVERSION     D[facility]V 2015   COLONOSCOPY  08/09/2019   One large adenoma-->recall 3 yrs   TONSILLECTOMY     TRANSTHORACIC ECHOCARDIOGRAM  06/08/2018; 05/2019   05/2018 (in context of acute CVA): EF 35-40%, mild/mod MR, mod/sev TR, severely dilated LA.  05/2019 EF 50-52%, low normal LV wall motion, grd II DD, mod MR and mod TR, mild pulm HTN.     FAMILY HISTORY: family history includes Alcohol abuse in his father; Congestive Heart Failure in his mother; Diabetes in his mother; Heart disease in his paternal grandfather; Hypertension in his father; Stroke in his father.   SOCIAL HISTORY:  reports that he quit smoking about 8 years ago. His smoking use included cigarettes. He has a 5.00 pack-year smoking history. He has never used smokeless tobacco. He reports that he does not currently use alcohol. He reports that he does not use drugs. The patient is married and lives in [place] with his wife. His daughter Velna Hatchet helps with his medical decision making.    ALLERGIES: Patient has no known allergies.   MEDICATIONS:  Current Facility-Administered Medications  Medication Dose Route Frequency Provider Last Rate Last Admin   0.9 %  sodium chloride infusion   Intravenous Continuous Irving Copas, PA-C 75 mL/hr at 08/03/21 2101 New Bag at 08/03/21 2101   acetaminophen (TYLENOL) tablet 325-650 mg  325-650 mg Oral Q6H PRN Irving Copas, PA-C       allopurinol (ZYLOPRIM) tablet 200 mg  200 mg Oral Daily Irving Copas, PA-C   200 mg at 08/04/21 0816   atorvastatin (LIPITOR) tablet 40 mg  40 mg Oral Daily Irving Copas, PA-C   40 mg at 08/04/21 9326   bisacodyl (DULCOLAX) suppository 10 mg  10 mg Rectal Daily PRN Lu Duffel,  Nicola Girt, PA-C       [START ON 08/05/2021] digoxin (LANOXIN) tablet 0.125 mg  0.125 mg Oral QODAY Irving Copas, PA-C       diphenhydrAMINE (BENADRYL) 12.5 MG/5ML elixir 12.5-25 mg  12.5-25 mg Oral Q4H PRN Irving Copas, PA-C       docusate sodium (COLACE) capsule  100 mg  100 mg Oral BID Irving Copas, PA-C   100 mg at 08/04/21 6503   ferrous sulfate tablet 325 mg  325 mg Oral TID PC Irving Copas, PA-C   325 mg at 54/65/68 1275   folic acid (FOLVITE) tablet 1 mg  1 mg Oral Daily Irving Copas, PA-C   1 mg at 08/04/21 0816   HYDROcodone-acetaminophen (NORCO) 7.5-325 MG per tablet 1-2 tablet  1-2 tablet Oral Q4H PRN Irving Copas, PA-C   1 tablet at 08/04/21 1051   HYDROcodone-acetaminophen (NORCO/VICODIN) 5-325 MG per tablet 1-2 tablet  1-2 tablet Oral Q4H PRN Irving Copas, PA-C   2 tablet at 08/04/21 1700   lisinopril (ZESTRIL) tablet 20 mg  20 mg Oral Daily Irving Copas, PA-C   20 mg at 08/04/21 1051   menthol-cetylpyridinium (CEPACOL) lozenge 3 mg  1 lozenge Oral PRN Irving Copas, PA-C       Or   phenol (CHLORASEPTIC) mouth spray 1 spray  1 spray Mouth/Throat PRN Irving Copas, PA-C       methocarbamol (ROBAXIN) tablet 500 mg  500 mg Oral Q6H PRN Irving Copas, PA-C   500 mg at 08/03/21 1911   Or   methocarbamol (ROBAXIN) 500 mg in dextrose 5 % 50 mL IVPB  500 mg Intravenous Q6H PRN Irving Copas, PA-C       metoCLOPramide (REGLAN) tablet 5-10 mg  5-10 mg Oral Q8H PRN Irving Copas, PA-C       Or   metoCLOPramide (REGLAN) injection 5-10 mg  5-10 mg Intravenous Q8H PRN Irving Copas, PA-C       metoprolol succinate (TOPROL-XL) 24 hr tablet 25 mg  25 mg Oral Daily Irving Copas, PA-C   25 mg at 08/04/21 1051   morphine (PF) 2 MG/ML injection 0.5-1 mg  0.5-1 mg Intravenous Q2H PRN Irving Copas, PA-C       ondansetron [facility]) tablet 4 mg  4 mg Oral Q6H PRN Irving Copas, PA-C       Or   ondansetron (ZOFRAN) injection 4 mg  4 mg Intravenous Q6H PRN Irving Copas, PA-C       pantoprazole (PROTONIX) EC tablet 40 mg  40 mg Oral BID Irving Copas, PA-C   40 mg at 08/04/21 1749   polyethylene glycol (MIRALAX / GLYCOLAX) packet 17 g  17 g Oral Daily PRN Irving Copas, PA-C        rivaroxaban (XARELTO) tablet 20 mg  20 mg Oral Q supper Irving Copas, PA-C   20 mg at 08/04/21 4496     REVIEW OF SYSTEMS:  On review of systems, the patient reports that he is doing okay but is tired and still having some discomfort from his surgery. No other complaints are verbalized.      PHYSICAL EXAM:  height is 5' 8.9" (1.75 m) and weight is 191 lb 12.8 oz (87 kg). His oral temperature is 98.9 F (37.2 C). His blood pressure is 132/85 and his pulse is 88. His respiration is 18 and oxygen saturation is 100%.  Unable to assess due to encounter type.  ECOG = 4  0 - Asymptomatic (Fully active, able to carry on all predisease activities without restriction)  1 - Symptomatic but completely ambulatory (Restricted in physically strenuous activity but ambulatory and able to carry out work of a light or sedentary nature. For example, light housework, office work)  2 - Symptomatic, <50% in bed during the day (Ambulatory and capable of all self care but unable to carry out any work activities. Up and about more than 50% of waking hours)  3 - Symptomatic, >50% in bed, but not bedbound (Capable of only limited self-care, confined to bed or chair 50% or more of waking hours)  4 - Bedbound (Completely disabled. Cannot carry on any self-care. Totally confined to bed or chair)  5 - Death   Eustace Pen MM, Creech RH, Tormey DC, et al. [phone]). "Toxicity and response criteria of the [facility]". [place] Oncol. 5 (6): 649-55   LABORATORY DATA:  Lab Results  Component Value Date   WBC 11.9 (H) 08/04/2021   HGB 10.7 (L) 08/04/2021   HCT 33.9 (L) 08/04/2021   MCV 93.9 08/04/2021   PLT 198 08/04/2021   Lab Results  Component Value Date   NA 137 08/04/2021   K 4.1 08/04/2021   CL 100 08/04/2021   CO2 27 08/04/2021   Lab Results  Component Value Date   ALT 15 07/22/2021   AST 17 07/22/2021   ALKPHOS 79 07/22/2021   BILITOT 1.0 07/22/2021      RADIOGRAPHY:  DG Pelvis Portable  Result Date: 08/03/2021 CLINICAL DATA:  Postop hip replacement EXAM: PORTABLE PELVIS 1-2 VIEWS COMPARISON:  08/03/2021, CT 07/16/2021 FINDINGS: Status post right hip replacement with intact hardware and normal alignment. Pubic symphysis is intact. There is no fracture. Gas in the soft tissues consistent with recent surgery. IMPRESSION: Status post right hip replacement with expected postsurgical changes. Electronically Signed   By: Donavan Foil M.D.   On: 08/03/2021 16:10   DG HIP UNILAT WITH PELVIS 1V RIGHT  Result Date: 08/03/2021 CLINICAL DATA:  Total right hip arthroplasty. EXAM: DG HIP (WITH OR WITHOUT PELVIS) 1V RIGHT COMPARISON:  06/06/2018 radiographs and CT scan 07/16/2021 FINDINGS: Well seated components of a total right hip arthroplasty. No complicating features are identified. IMPRESSION: Well seated components of a total right hip arthroplasty. Electronically Signed   By: Marijo Sanes M.D.   On: 08/03/2021 14:45   DG C-Arm 1-60 Min-No Report  Result Date: 08/03/2021 Fluoroscopy was utilized by the requesting physician.  No radiographic interpretation.   DG C-Arm 1-60 Min-No Report  Result Date: 08/03/2021 Fluoroscopy was utilized by the requesting physician.  No radiographic interpretation.   CT 3D RECON AT SCANNER  Result Date: 07/16/2021 CLINICAL DATA:  Nonspecific (abnormal) findings on radiological and other examination of musculoskeletal system right hip osteoarthritis. EXAM: 3-DIMENSIONAL CT IMAGE RENDERING ON ACQUISITION WORKSTATION TECHNIQUE: 3-dimensional CT images were rendered by post-processing of the original CT data on an acquisition workstation. The 3-dimensional CT images were interpreted and findings were reported in the accompanying complete CT report for this study COMPARISON:  None Available. FINDINGS: 3D imaging finding consistent with advanced right hip osteoarthritis as described in the previous report. IMPRESSION: Advanced right hip  osteoarthritis. Electronically Signed   By: Keane Police D.O.   On: 07/16/2021 13:58   CT HIP RIGHT WO CONTRAST  Result Date: 07/16/2021 CLINICAL DATA:  Right hip pain, preop. EXAM: CT OF THE RIGHT  HIP WITHOUT CONTRAST TECHNIQUE: Multidetector CT imaging of the right hip was performed according to the standard protocol. Multiplanar CT image reconstructions were also generated. RADIATION DOSE REDUCTION: This exam was performed according to the departmental dose-optimization program which includes automated exposure control, adjustment of the mA and/or kV according to patient size and/or use of iterative reconstruction technique. COMPARISON:  Radiographs dated Jun 06, 2018 FINDINGS: Bones/Joint/Cartilage Advanced right hip osteoarthritis with complete loss of the articular cartilage, bone-on-bone articulation and subchondral cystic changes. There is osseous remodeling of the acetabulum. There are bulky marginal osteophytes as well as heterotopic ossification. Ligaments Suboptimally assessed by CT. Muscles and Tendons Muscles are normal in bulk. No intramuscular hematoma or fluid collection. Enthesopathy of the hamstring origin. Soft tissues Skin and subcutaneous soft tissues are within normal limits. IMPRESSION: 1. Advanced right hip osteoarthritis with bone-on-bone articulation, subchondral cystic changes and bulky osteophytes. Osseous remodeling of the acetabulum as well as heterotopic ossification. This may be sequela of developmental dysplasia of the hip or reactive arthritis secondary to trauma. 2.  Hamstring origin enthesopathy. 3.  Skin and subcutaneous soft tissues are unremarkable. Electronically Signed   By: Keane Police D.O.   On: 07/16/2021 13:30       IMPRESSION:  Right Hip Osteoarthritis with high risks of postoperative heterotopic ossification. The patient is felt to be at high risk for developing postoperative heterotopic ossiciation due to significant peri-articular osteophytes seen in the  joint space at the time of surgery. The patient is a good candidate for one fraction of postoperative radiation treatment for the prevention of the development of heterotopic ossification. I have discussed the rationale of this treatment with the patient. I have discussed the possible/expected benefit of such a treatment. I have also discussed the possible side effects and risks of treatment as well. All of the patient's questions have been answered. The patient will undergo simulation and one fraction of external beam radiation treatment. This will be completed to a dose of 7 Gy. This treatment will be completed tomorrow, on postoperative day #2.       ________________________________  In a visit lasting 50 minutes, greater than 50% of the time was spent by phone and in floor time discussing the patient's condition, in preparation for the discussion, and coordinating the patient's care.     Carola Rhine, [facility]    **Disclaimer: This note was dictated with voice recognition software. Similar sounding words can inadvertently be transcribed and this note may contain transcription errors which may not have been corrected upon publication of note.**

## 2021-08-04 NOTE — Plan of Care (Signed)
  Problem: Education: Goal: Knowledge of General Education information will improve Description: Including pain rating scale, medication(s)/side effects and non-pharmacologic comfort measures Outcome: Progressing   Problem: Activity: Goal: Risk for activity intolerance will decrease Outcome: Progressing   Problem: Pain Managment: Goal: General experience of comfort will improve Outcome: Progressing   

## 2021-08-05 ENCOUNTER — Encounter (HOSPITAL_COMMUNITY): Payer: Self-pay | Admitting: Orthopedic Surgery

## 2021-08-05 ENCOUNTER — Encounter: Payer: Self-pay | Admitting: Radiation Oncology

## 2021-08-05 ENCOUNTER — Ambulatory Visit
Admission: RE | Admit: 2021-08-05 | Discharge: 2021-08-05 | Disposition: A | Discharge status: Home / Self Care | Payer: Medicare HMO | Source: Ambulatory Visit | Attending: Radiation Oncology | Admitting: Radiation Oncology

## 2021-08-05 ENCOUNTER — Ambulatory Visit
Admit: 2021-08-05 | Discharge: 2021-08-05 | Disposition: A | Discharge status: Home / Self Care | Payer: MEDICARE | Attending: Radiation Oncology | Admitting: Radiation Oncology

## 2021-08-05 DIAGNOSIS — Z510 Encounter for antineoplastic radiation therapy: Secondary | ICD-10-CM | POA: Diagnosis not present

## 2021-08-05 DIAGNOSIS — Z8616 Personal history of COVID-19: Secondary | ICD-10-CM | POA: Diagnosis not present

## 2021-08-05 DIAGNOSIS — I2510 Atherosclerotic heart disease of native coronary artery without angina pectoris: Secondary | ICD-10-CM | POA: Diagnosis not present

## 2021-08-05 DIAGNOSIS — M1611 Unilateral primary osteoarthritis, right hip: Secondary | ICD-10-CM | POA: Diagnosis not present

## 2021-08-05 DIAGNOSIS — Z7901 Long term (current) use of anticoagulants: Secondary | ICD-10-CM | POA: Diagnosis not present

## 2021-08-05 DIAGNOSIS — Z79899 Other long term (current) drug therapy: Secondary | ICD-10-CM | POA: Diagnosis not present

## 2021-08-05 DIAGNOSIS — Z96641 Presence of right artificial hip joint: Secondary | ICD-10-CM

## 2021-08-05 DIAGNOSIS — Z87891 Personal history of nicotine dependence: Secondary | ICD-10-CM | POA: Diagnosis not present

## 2021-08-05 DIAGNOSIS — E119 Type 2 diabetes mellitus without complications: Secondary | ICD-10-CM | POA: Diagnosis not present

## 2021-08-05 DIAGNOSIS — I4891 Unspecified atrial fibrillation: Secondary | ICD-10-CM | POA: Diagnosis not present

## 2021-08-05 DIAGNOSIS — I10 Essential (primary) hypertension: Secondary | ICD-10-CM | POA: Diagnosis not present

## 2021-08-05 DIAGNOSIS — Z8673 Personal history of transient ischemic attack (TIA), and cerebral infarction without residual deficits: Secondary | ICD-10-CM | POA: Diagnosis not present

## 2021-08-05 DIAGNOSIS — M9689 Other intraoperative and postprocedural complications and disorders of the musculoskeletal system: Secondary | ICD-10-CM | POA: Diagnosis not present

## 2021-08-05 LAB — CBC
HCT: 31.7 % — ABNORMAL LOW (ref 39.0–52.0)
Hemoglobin: 10.4 g/dL — ABNORMAL LOW (ref 13.0–17.0)
MCH: 30.2 pg (ref 26.0–34.0)
MCHC: 32.8 g/dL (ref 30.0–36.0)
MCV: 92.2 fL (ref 80.0–100.0)
Platelets: 205 10*3/uL (ref 150–400)
RBC: 3.44 MIL/uL — ABNORMAL LOW (ref 4.22–5.81)
RDW: 13.3 % (ref 11.5–15.5)
WBC: 14.2 10*3/uL — ABNORMAL HIGH (ref 4.0–10.5)
nRBC: 0 % (ref 0.0–0.2)

## 2021-08-05 MED ORDER — HYDROCODONE-ACETAMINOPHEN 5-325 MG PO TABS: 1.0000 | ORAL_TABLET | ORAL | 0 refills | Status: DC | PRN

## 2021-08-05 MED ORDER — DOCUSATE SODIUM 100 MG PO CAPS: 100.0000 mg | ORAL_CAPSULE | Freq: Two times a day (BID) | ORAL | 0 refills | Status: DC

## 2021-08-05 MED ORDER — POLYETHYLENE GLYCOL 3350 17 G PO PACK: 17.0000 g | PACK | Freq: Every day | ORAL | 0 refills | Status: AC | PRN

## 2021-08-05 MED ORDER — ONDANSETRON HCL 4 MG PO TABS: 4.0000 mg | ORAL_TABLET | Freq: Four times a day (QID) | ORAL | 0 refills | Status: DC | PRN

## 2021-08-05 MED ORDER — METHOCARBAMOL 500 MG PO TABS: 500.0000 mg | ORAL_TABLET | Freq: Four times a day (QID) | ORAL | 0 refills | Status: DC | PRN

## 2021-08-05 NOTE — Plan of Care (Signed)
  Problem: Education: Goal: Knowledge of General Education information will improve Description: Including pain rating scale, medication(s)/side effects and non-pharmacologic comfort measures Outcome: Adequate for Discharge   Problem: Health Behavior/Discharge Planning: Goal: Ability to manage health-related needs will improve Outcome: Adequate for Discharge   Problem: Clinical Measurements: Goal: Ability to maintain clinical measurements within normal limits will improve Outcome: Adequate for Discharge Goal: Will remain free from infection Outcome: Adequate for Discharge Goal: Diagnostic test results will improve Outcome: Adequate for Discharge Goal: Respiratory complications will improve Outcome: Adequate for Discharge Goal: Cardiovascular complication will be avoided Outcome: Adequate for Discharge   Problem: Activity: Goal: Risk for activity intolerance will decrease Outcome: Adequate for Discharge   Problem: Nutrition: Goal: Adequate nutrition will be maintained Outcome: Adequate for Discharge   Problem: Coping: Goal: Level of anxiety will decrease Outcome: Adequate for Discharge   Problem: Elimination: Goal: Will not experience complications related to bowel motility Outcome: Adequate for Discharge Goal: Will not experience complications related to urinary retention Outcome: Adequate for Discharge   Problem: Pain Managment: Goal: General experience of comfort will improve Outcome: Adequate for Discharge   Problem: Safety: Goal: Ability to remain free from injury will improve Outcome: Adequate for Discharge   Problem: Skin Integrity: Goal: Risk for impaired skin integrity will decrease Outcome: Adequate for Discharge   Problem: Education: Goal: Knowledge of the prescribed therapeutic regimen will improve Outcome: Adequate for Discharge Goal: Understanding of discharge needs will improve Outcome: Adequate for Discharge Goal: Individualized Educational  Video(s) Outcome: Adequate for Discharge   Problem: Activity: Goal: Ability to avoid complications of mobility impairment will improve Outcome: Adequate for Discharge Goal: Ability to tolerate increased activity will improve Outcome: Adequate for Discharge   Problem: Clinical Measurements: Goal: Postoperative complications will be avoided or minimized Outcome: Adequate for Discharge   Problem: Pain Management: Goal: Pain level will decrease with appropriate interventions Outcome: Adequate for Discharge   Problem: Skin Integrity: Goal: Will show signs of wound healing Outcome: Adequate for Discharge   Problem: Acute Rehab PT Goals(only PT should resolve) Goal: Pt Will Ambulate Outcome: Adequate for Discharge Goal: Pt Will Go Up/Down Stairs Outcome: Adequate for Discharge Goal: Pt/caregiver will Perform Home Exercise Program Outcome: Adequate for Discharge   Pt discharged home with his wife. Discharge teaching done with pt and written information given.  RN spoke to wife and brother on the phone. Instructed to only shower with supervision.

## 2021-08-05 NOTE — Anesthesia Postprocedure Evaluation (Signed)
Anesthesia Post Note  Patient: Robert [name]  Procedure(s) Performed: TOTAL HIP ARTHROPLASTY ANTERIOR APPROACH (Right: Hip)     Patient location during evaluation: PACU Anesthesia Type: MAC and Spinal Level of consciousness: oriented and awake and alert Pain management: pain level controlled Vital Signs Assessment: post-procedure vital signs reviewed and stable Respiratory status: spontaneous breathing, respiratory function stable and patient connected to nasal cannula oxygen Cardiovascular status: blood pressure returned to baseline and stable Postop Assessment: no headache, no backache and no apparent nausea or vomiting Anesthetic complications: no   No notable events documented.  Last Vitals:  Vitals:   08/04/21 2202 08/05/21 0613  BP: 103/68 115/74  Pulse: 88 86  Resp: 17 17  Temp: 36.9 C 36.6 C  SpO2: 97% 97%    Last Pain:  Vitals:   08/05/21 0613  TempSrc: Oral  PainSc:                  [place]

## 2021-08-05 NOTE — Progress Notes (Signed)
Physical Therapy Treatment Patient Details Name: Robert [name] MRN: [id number] DOB: 1960/12/19 Today's Date: 08/05/2021   History of Present Illness Pt is a 61yo male presenting s/p R-THA, AA on 08/03/21. PMH: ETOH, OA, Afib, hx of CVA, gout, HLD, HTN, DM    PT Comments    Pt very cooperative and progressing well with mobility.  Pt up to ambulate increased distance in hall, negotiated stairs, and up to bathroom for toileting.  Pt hopeful for dc home this date after radiation.  Recommendations for follow up therapy are one component of a multi-disciplinary discharge planning process, led by the attending physician.  Recommendations may be updated based on patient status, additional functional criteria and insurance authorization.  Follow Up Recommendations  Follow physician's recommendations for discharge plan and follow up therapies     Assistance Recommended at Discharge Intermittent Supervision/Assistance  Patient can return home with the following A little help with walking and/or transfers;A little help with bathing/dressing/bathroom;Assistance with cooking/housework;Assist for transportation;Help with stairs or ramp for entrance   Equipment Recommendations  None recommended by PT    Recommendations for Other Services       Precautions / Restrictions Precautions Precautions: Fall Restrictions Weight Bearing Restrictions: No RLE Weight Bearing: Weight bearing as tolerated     Mobility  Bed Mobility Overal bed mobility: Needs Assistance Bed Mobility: Sit to Supine       Sit to supine: Supervision   General bed mobility comments: up from R side of bed and requiring no assist with R LE    Transfers Overall transfer level: Needs assistance Equipment used: Rolling walker (2 wheels) Transfers: Sit to/from Stand Sit to Stand: Min guard, Supervision           General transfer comment: VCs hand placement    Ambulation/Gait Ambulation/Gait assistance: Min guard,  Supervision Gait Distance (Feet): 300 Feet Assistive device: Rolling walker (2 wheels) Gait Pattern/deviations: Trunk flexed, Step-to pattern, Step-through pattern, Antalgic Gait velocity: decr     General Gait Details: VCs posture and positioning in RW, pt reports he had 2 inch leg length discrepancy prior to surgery and walked with flexed trunk at baseline   Stairs Stairs: Yes Stairs assistance: Min guard Stair Management: Two rails, Step to pattern, Forwards Number of Stairs: 5 General stair comments: 2+3 stairs with cues for sequence   Wheelchair Mobility    Modified Rankin (Stroke Patients Only)       Balance Overall balance assessment: Needs assistance Sitting-balance support: Feet supported, No upper extremity supported Sitting balance-Leahy Scale: Good     Standing balance support: No upper extremity supported Standing balance-Leahy Scale: Fair                              Cognition Arousal/Alertness: Awake/alert Behavior During Therapy: WFL for tasks assessed/performed Overall Cognitive Status: Within Functional Limits for tasks assessed                                 General Comments: Pt generally anxious about status, recovery, which AD he will use (repeated questions).        Exercises      General Comments        Pertinent Vitals/Pain Pain Assessment Pain Assessment: 0-10 Pain Score: 5  Pain Location: R hip with walking Pain Descriptors / Indicators: Operative site guarding, Discomfort Pain Intervention(s): Limited activity within patient's tolerance, Monitored during  session, Premedicated before session    Home Living                          Prior Function            PT Goals (current goals can now be found in the care plan section) Acute Rehab PT Goals Patient Stated Goal: To walk without using any device PT Goal Formulation: With patient Time For Goal Achievement: 08/10/21 Potential to  Achieve Goals: Good Progress towards PT goals: Progressing toward goals    Frequency    7X/week      PT Plan Current plan remains appropriate    Co-evaluation              AM-PAC PT "6 Clicks" Mobility   Outcome Measure  Help needed turning from your back to your side while in a flat bed without using bedrails?: A Little Help needed moving from lying on your back to sitting on the side of a flat bed without using bedrails?: A Little Help needed moving to and from a bed to a chair (including a wheelchair)?: A Little Help needed standing up from a chair using your arms (e.g., wheelchair or bedside chair)?: A Little Help needed to walk in hospital room?: A Little Help needed climbing 3-5 steps with a railing? : A Little 6 Click Score: 18    End of Session Equipment Utilized During Treatment: Gait belt Activity Tolerance: Patient tolerated treatment well Patient left: with call bell/phone within reach;in bed;with bed alarm set Nurse Communication: Mobility status PT Visit Diagnosis: Pain;Difficulty in walking, not elsewhere classified (R26.2) Pain - Right/Left: Right Pain - part of body: Hip     Time: 1000-1033 PT Time Calculation (min) (ACUTE ONLY): 33 min  Charges:  $Gait Training: 8-22 mins $Therapeutic Activity: 8-22 mins                     [place] Pager [phone] Office [phone]    Leith Hedlund 08/05/2021, 12:43 PM

## 2021-08-05 NOTE — Progress Notes (Signed)
Subjective: 2 Days Post-Op Procedure(s) (LRB): TOTAL HIP ARTHROPLASTY ANTERIOR APPROACH (Right) Patient reports pain as mild.   Patient seen in rounds for Dr. Alvan Dame. Patient is resting in bed on exam this morning. He was speaking with family on the phone about discharge today. He does feel ready to get home today. Ambulated 180 feet with PT yesterday.  We will continue therapy today.   Objective: Vital signs in last 24 hours: Temp:  [97.8 F (36.6 C)-98.9 F (37.2 C)] 97.8 F (36.6 C) (07/27 9741) Pulse Rate:  [78-88] 86 (07/27 0613) Resp:  [17-18] 17 (07/27 0613) BP: (103-132)/(68-85) 115/74 (07/27 0613) SpO2:  [97 %-100 %] 97 % (07/27 0613)  Intake/Output from previous day:  Intake/Output Summary (Last 24 hours) at 08/05/2021 0738 Last data filed at 08/05/2021 0600 Gross per 24 hour  Intake 1470.29 ml  Output 1225 ml  Net 245.29 ml     Intake/Output this shift: No intake/output data recorded.  Labs: Recent Labs    08/04/21 0346 08/05/21 0342  HGB 10.7* 10.4*   Recent Labs    08/04/21 0346 08/05/21 0342  WBC 11.9* 14.2*  RBC 3.61* 3.44*  HCT 33.9* 31.7*  PLT 198 205   Recent Labs    08/04/21 0346  NA 137  K 4.1  CL 100  CO2 27  BUN 16  CREATININE 1.02  GLUCOSE 176*  CALCIUM 8.9   No results for input(s): "LABPT", "INR" in the last 72 hours.  Exam: General - Patient is Alert and Oriented Extremity - Neurologically intact Sensation intact distally Intact pulses distally Dorsiflexion/Plantar flexion intact Dressing - dressing C/D/I Motor Function - intact, moving foot and toes well on exam.   Past Medical History:  Diagnosis Date   Alcohol abuse    ongoing as of 07/2017.  Quit after CVA 05/2018   Arthritis    Atrial fibrillation ([place])    Not candidate for anticoagulation b/c of GI bleeding; had to get transfused.  Then, pt had CVA 05/2018 and he was put on xarelto and he quit drinking.   Coronary atherosclerosis    Noted on lung cancer  screening CT   COVID-19 virus infection 06/2019   CVA (cerebral vascular accident) ([place]) 05/2018   large R MCA territory infarct.  CTA neg for large cerebral vessel occlusion.  R ICA 70% stenosis, L clear. Rpt CTA neck ordered by neuro as of 01/2019.   Gastric ulcer    hx of--unclear (pt and wife deny this)   Gout    Usually MTP joint   History of adenomatous polyp of colon 2021   recall 2024   History of digital clubbing    "all my adult life"--CXR 08/2016 = bronchitic changes o/w normal.   Hyperkalemia 04/2017   Normalized with decreasing lisinopril from 40 mg qd to 20 mg qd.   Hyperlipidemia 07/2016   Holding off on statin until pt quits drinking.  Back on statin after CVA 05/2018   Hypertension    Nonischemic cardiomyopathy ([place]) 2015; 2020   2015: "resolved" (w/medical therapy?).  05/2018 EF 35-40%, with TR and MR and ++LAE. 05/2019 EF 50-52%, low normal LV wall motion, grd II DD, mod MR and mod TR, mild pulm HTN.   Nonrheumatic mitral valve regurgitation    Osteoarthritis of right hip    Surgery being planned as of 05/2021 consult with Dr. Alvan Dame   Paralysis [facility])    some left side deficit from CVA in 2020   Pre-diabetes  Screening for lung cancer    04/2021 CT showed no lesions/nodules.  Repeat 1 year   Sleep apnea    Tobacco abuse    Quit 2014    Assessment/Plan: 2 Days Post-Op Procedure(s) (LRB): TOTAL HIP ARTHROPLASTY ANTERIOR APPROACH (Right) Principal Problem:   S/P total right hip arthroplasty  Estimated body mass index is 28.41 kg/m as calculated from the following:   Height as of this encounter: 5' 8.9" (1.75 m).   Weight as of this encounter: 87 kg. Advance diet Up with therapy D/C IV fluids  DVT Prophylaxis - Xarelto Weight bearing as tolerated.  Hgb stable at 10.4 this AM.  Plan is to go Home after hospital stay. Plan for discharge today after meeting goals with therapy and after radiation treatment. Follow up in the office in 2 weeks.   Griffith Citron,  PA-C Orthopedic Surgery [phone] 08/05/2021, 7:38 AM

## 2021-08-05 NOTE — Progress Notes (Signed)
Physical Therapy Treatment Patient Details Name: Robert [name] MRN: [id number] DOB: [birth year] Today's Date: 08/05/2021   History of Present Illness Pt is a 61yo male presenting s/p R-THA, AA on 08/03/21. PMH: ETOH, OA, Afib, hx of CVA, gout, HLD, HTN, DM    PT Comments    Pt continues very cooperative and performed HEP with assist.  Written instruction provided and reviewed with therex progression included.  Car tranfers reviewed.  Pt with many questions asked and answered.   Recommendations for follow up therapy are one component of a multi-disciplinary discharge planning process, led by the attending physician.  Recommendations may be updated based on patient status, additional functional criteria and insurance authorization.  Follow Up Recommendations  Follow physician's recommendations for discharge plan and follow up therapies     Assistance Recommended at Discharge Intermittent Supervision/Assistance  Patient can return home with the following A little help with walking and/or transfers;A little help with bathing/dressing/bathroom;Assistance with cooking/housework;Assist for transportation;Help with stairs or ramp for entrance   Equipment Recommendations  None recommended by PT    Recommendations for Other Services       Precautions / Restrictions Precautions Precautions: Fall Restrictions Weight Bearing Restrictions: No RLE Weight Bearing: Weight bearing as tolerated     Mobility  Bed Mobility Overal bed mobility: Needs Assistance Bed Mobility: Sit to Supine       Sit to supine: Supervision   General bed mobility comments: up from R side of bed and requiring no assist with R LE    Transfers Overall transfer level: Needs assistance Equipment used: Rolling walker (2 wheels) Transfers: Sit to/from Stand Sit to Stand: Min guard, Supervision           General transfer comment: VCs hand placement    Ambulation/Gait Ambulation/Gait assistance: Min guard,  Supervision Gait Distance (Feet): 300 Feet Assistive device: Rolling walker (2 wheels) Gait Pattern/deviations: Trunk flexed, Step-to pattern, Step-through pattern, Antalgic Gait velocity: decr     General Gait Details: VCs posture and positioning in RW, pt reports he had 2 inch leg length discrepancy prior to surgery and walked with flexed trunk at baseline   Stairs Stairs: Yes Stairs assistance: Min guard Stair Management: Two rails, Step to pattern, Forwards Number of Stairs: 5 General stair comments: 2+3 stairs with cues for sequence   Wheelchair Mobility    Modified Rankin (Stroke Patients Only)       Balance Overall balance assessment: Needs assistance Sitting-balance support: Feet supported, No upper extremity supported Sitting balance-Leahy Scale: Good     Standing balance support: No upper extremity supported Standing balance-Leahy Scale: Fair                              Cognition Arousal/Alertness: Awake/alert Behavior During Therapy: WFL for tasks assessed/performed Overall Cognitive Status: Within Functional Limits for tasks assessed                                 General Comments: Pt generally anxious about status, recovery, which AD he will use (repeated questions).        Exercises Total Joint Exercises Ankle Circles/Pumps: AROM, Both, 10 reps Quad Sets: AROM, Right, Supine, 10 reps Heel Slides: AAROM, Right, Supine, 20 reps Hip ABduction/ADduction: AAROM, Right, Supine, 15 reps Long Arc Quad: AROM, Right, Seated, 10 reps    General Comments        Pertinent  Vitals/Pain Pain Assessment Pain Assessment: 0-10 Pain Score: 5  Pain Location: R hip Pain Descriptors / Indicators: Operative site guarding, Discomfort Pain Intervention(s): Limited activity within patient's tolerance    Home Living                          Prior Function            PT Goals (current goals can now be found in the care  plan section) Acute Rehab PT Goals Patient Stated Goal: To walk without using any device PT Goal Formulation: With patient Time For Goal Achievement: 08/10/21 Potential to Achieve Goals: Good Progress towards PT goals: Progressing toward goals    Frequency    7X/week      PT Plan Current plan remains appropriate    Co-evaluation              AM-PAC PT "6 Clicks" Mobility   Outcome Measure  Help needed turning from your back to your side while in a flat bed without using bedrails?: A Little Help needed moving from lying on your back to sitting on the side of a flat bed without using bedrails?: A Little Help needed moving to and from a bed to a chair (including a wheelchair)?: A Little Help needed standing up from a chair using your arms (e.g., wheelchair or bedside chair)?: A Little Help needed to walk in hospital room?: A Little Help needed climbing 3-5 steps with a railing? : A Little 6 Click Score: 18    End of Session Equipment Utilized During Treatment: Gait belt Activity Tolerance: Patient tolerated treatment well Patient left: with call bell/phone within reach;in bed;with bed alarm set Nurse Communication: Mobility status PT Visit Diagnosis: Pain;Difficulty in walking, not elsewhere classified (R26.2) Pain - Right/Left: Right Pain - part of body: Hip     Time: 3382-5053 PT Time Calculation (min) (ACUTE ONLY): 31 min  Charges:  $Gait Training: 8-22 mins $Therapeutic Exercise: 23-37 mins $Therapeutic Activity: 8-22 mins                     [place] Pager [phone] Office [phone]    Katelind Pytel 08/05/2021, 12:47 PM

## 2021-08-11 NOTE — Discharge Summary (Signed)
Physician Discharge Summary   Patient ID: Robert [name] MRN: [id number] DOB/AGE: [birth year] 61 y.o.  Admit date: 08/03/2021 Discharge date: 08/05/2021  Primary Diagnosis: Right  hip osteoarthritis.   Admission Diagnoses:  Past Medical History:  Diagnosis Date   Alcohol abuse    ongoing as of 07/2017.  Quit after CVA 05/2018   Arthritis    Atrial fibrillation ([place])    Not candidate for anticoagulation b/c of GI bleeding; had to get transfused.  Then, pt had CVA 05/2018 and he was put on xarelto and he quit drinking.   Coronary atherosclerosis    Noted on lung cancer screening CT   COVID-19 virus infection 06/2019   CVA (cerebral vascular accident) ([place]) 05/2018   large R MCA territory infarct.  CTA neg for large cerebral vessel occlusion.  R ICA 70% stenosis, L clear. Rpt CTA neck ordered by neuro as of 01/2019.   Gastric ulcer    hx of--unclear (pt and wife deny this)   Gout    Usually MTP joint   History of adenomatous polyp of colon 2021   recall 2024   History of digital clubbing    "all my adult life"--CXR 08/2016 = bronchitic changes o/w normal.   Hyperkalemia 04/2017   Normalized with decreasing lisinopril from 40 mg qd to 20 mg qd.   Hyperlipidemia 07/2016   Holding off on statin until pt quits drinking.  Back on statin after CVA 05/2018   Hypertension    Nonischemic cardiomyopathy ([place]) 2015; 2020   2015: "resolved" (w/medical therapy?).  05/2018 EF 35-40%, with TR and MR and ++LAE. 05/2019 EF 50-52%, low normal LV wall motion, grd II DD, mod MR and mod TR, mild pulm HTN.   Nonrheumatic mitral valve regurgitation    Osteoarthritis of right hip    Surgery being planned as of 05/2021 consult with Dr. Alvan Dame   Paralysis [facility])    some left side deficit from CVA in 2020   Pre-diabetes    Screening for lung cancer    04/2021 CT showed no lesions/nodules.  Repeat 1 year   Sleep apnea    Tobacco abuse    Quit 2014   Discharge Diagnoses:   Principal Problem:   S/P total right hip  arthroplasty  Estimated body mass index is 28.41 kg/m as calculated from the following:   Height as of this encounter: 5' 8.9" (1.75 m).   Weight as of this encounter: 87 kg.  Procedure:  Procedure(s) (LRB): TOTAL HIP ARTHROPLASTY ANTERIOR APPROACH (Right)   Consults:  Interventional Radiology  HPI:  Robert [name] is a 61 y.o. male who had   presented to office for evaluation of right hip pain.  Radiographs revealed   progressive degenerative changes with bone-on-bone   articulation of the  hip joint, including subchondral cystic changes and osteophytes.  The patient had painful limited range of   motion significantly affecting their overall quality of life and function.  The patient was failing to    respond to conservative measures including medications and/or injections and activity modification and at this point was ready   to proceed with more definitive measures.  Consent was obtained for   benefit of pain relief.  Specific risks of infection, DVT, component   failure, dislocation, neurovascular injury, and need for revision surgery were reviewed in the office.  Laboratory Data: Admission on 08/03/2021, Discharged on 08/05/2021  Component Date Value Ref Range Status   Glucose-Capillary 08/03/2021 106 (H)  70 - 99 mg/dL Final  Glucose reference range applies only to samples taken after fasting for at least 8 hours.   Glucose-Capillary 08/03/2021 88  70 - 99 mg/dL Final   Glucose reference range applies only to samples taken after fasting for at least 8 hours.   WBC 08/04/2021 11.9 (H)  4.0 - 10.5 K/uL Final   RBC 08/04/2021 3.61 (L)  4.22 - 5.81 MIL/uL Final   Hemoglobin 08/04/2021 10.7 (L)  13.0 - 17.0 g/dL Final   HCT 08/04/2021 33.9 (L)  39.0 - 52.0 % Final   MCV 08/04/2021 93.9  80.0 - 100.0 fL Final   MCH 08/04/2021 29.6  26.0 - 34.0 pg Final   MCHC 08/04/2021 31.6  30.0 - 36.0 g/dL Final   RDW 08/04/2021 13.0  11.5 - 15.5 % Final   Platelets 08/04/2021 198  150 - 400  K/uL Final   nRBC 08/04/2021 0.0  0.0 - 0.2 % Final   Performed at [facility], [place] [address] [place], [place] [address]   Sodium 08/04/2021 137  135 - 145 mmol/L Final   Potassium 08/04/2021 4.1  3.5 - 5.1 mmol/L Final   Chloride 08/04/2021 100  98 - 111 mmol/L Final   CO2 08/04/2021 27  22 - 32 mmol/L Final   Glucose, Bld 08/04/2021 176 (H)  70 - 99 mg/dL Final   Glucose reference range applies only to samples taken after fasting for at least 8 hours.   BUN 08/04/2021 16  8 - 23 mg/dL Final   Creatinine, Ser 08/04/2021 1.02  0.61 - 1.24 mg/dL Final   Calcium 08/04/2021 8.9  8.9 - 10.3 mg/dL Final   GFR, Estimated 08/04/2021 >60  >60 mL/min Final   Comment: (NOTE) Calculated using the CKD-EPI Creatinine Equation (2021)    Anion gap 08/04/2021 10  5 - 15 Final   Performed at [facility], [place] [address], [place], [place] [address]   WBC 08/05/2021 14.2 (H)  4.0 - 10.5 K/uL Final   RBC 08/05/2021 3.44 (L)  4.22 - 5.81 MIL/uL Final   Hemoglobin 08/05/2021 10.4 (L)  13.0 - 17.0 g/dL Final   HCT 08/05/2021 31.7 (L)  39.0 - 52.0 % Final   MCV 08/05/2021 92.2  80.0 - 100.0 fL Final   MCH 08/05/2021 30.2  26.0 - 34.0 pg Final   MCHC 08/05/2021 32.8  30.0 - 36.0 g/dL Final   RDW 08/05/2021 13.3  11.5 - 15.5 % Final   Platelets 08/05/2021 205  150 - 400 K/uL Final   nRBC 08/05/2021 0.0  0.0 - 0.2 % Final   Performed at [facility], [place] [address] [place], [place] [address]  Hospital Outpatient Visit on 07/22/2021  Component Date Value Ref Range Status   MRSA, PCR 07/22/2021 NEGATIVE  NEGATIVE Final   Staphylococcus aureus 07/22/2021 NEGATIVE  NEGATIVE Final   Comment: (NOTE) The Xpert SA Assay (FDA approved for NASAL specimens in patients 51 years of age and older), is one component of a comprehensive surveillance program. It is not intended to diagnose infection nor to guide or monitor treatment. Performed at [facility], [place] [address] [place], [place] [address]    ABO/RH(D) 07/22/2021 O POS   Final   Antibody Screen 07/22/2021 NEG   Final   Sample Expiration 07/22/2021 08/05/2021,2359   Final   Extend sample reason 07/22/2021    Final  Value:NO TRANSFUSIONS OR PREGNANCY IN THE PAST 3 MONTHS Performed at [place] [address], [place], [place] [address]    Hgb A1c MFr Bld 07/22/2021 6.0 (H)  4.8 - 5.6 % Final   Comment: (NOTE) Pre diabetes:          5.7%-6.4%  Diabetes:              >6.4%  Glycemic control for   <7.0% adults with diabetes    Mean Plasma Glucose 07/22/2021 125.5  mg/dL Final   Performed at [place] [address] [place], [place] [address]   Sodium 07/22/2021 140  135 - 145 mmol/L Final   Potassium 07/22/2021 4.3  3.5 - 5.1 mmol/L Final   Chloride 07/22/2021 104  98 - 111 mmol/L Final   CO2 07/22/2021 28  22 - 32 mmol/L Final   Glucose, Bld 07/22/2021 103 (H)  70 - 99 mg/dL Final   Glucose reference range applies only to samples taken after fasting for at least 8 hours.   BUN 07/22/2021 15  8 - 23 mg/dL Final   Creatinine, Ser 07/22/2021 0.97  0.61 - 1.24 mg/dL Final   Calcium 07/22/2021 9.3  8.9 - 10.3 mg/dL Final   Total Protein 07/22/2021 7.9  6.5 - 8.1 g/dL Final   Albumin 07/22/2021 4.6  3.5 - 5.0 g/dL Final   AST 07/22/2021 17  15 - 41 U/L Final   ALT 07/22/2021 15  0 - 44 U/L Final   Alkaline Phosphatase 07/22/2021 79  38 - 126 U/L Final   Total Bilirubin 07/22/2021 1.0  0.3 - 1.2 mg/dL Final   GFR, Estimated 07/22/2021 >60  >60 mL/min Final   Comment: (NOTE) Calculated using the CKD-EPI Creatinine Equation (2021)    Anion gap 07/22/2021 8  5 - 15 Final   Performed at [facility], [place] [address], [place], [place] [address]   WBC 07/22/2021 5.8  4.0 - 10.5 K/uL Final   RBC 07/22/2021 4.61  4.22 - 5.81 MIL/uL Final   Hemoglobin 07/22/2021 13.7  13.0 - 17.0 g/dL Final   HCT  07/22/2021 43.1  39.0 - 52.0 % Final   MCV 07/22/2021 93.5  80.0 - 100.0 fL Final   MCH 07/22/2021 29.7  26.0 - 34.0 pg Final   MCHC 07/22/2021 31.8  30.0 - 36.0 g/dL Final   RDW 07/22/2021 13.3  11.5 - 15.5 % Final   Platelets 07/22/2021 243  150 - 400 K/uL Final   nRBC 07/22/2021 0.0  0.0 - 0.2 % Final   Performed at [facility], [place] [address] [place], [place] [address]   Glucose-Capillary 07/22/2021 96  70 - 99 mg/dL Final   Glucose reference range applies only to samples taken after fasting for at least 8 hours.     X-Rays:DG Pelvis Portable  Result Date: 08/03/2021 CLINICAL DATA:  Postop hip replacement EXAM: PORTABLE PELVIS 1-2 VIEWS COMPARISON:  08/03/2021, CT 07/16/2021 FINDINGS: Status post right hip replacement with intact hardware and normal alignment. Pubic symphysis is intact. There is no fracture. Gas in the soft tissues consistent with recent surgery. IMPRESSION: Status post right hip replacement with expected postsurgical changes. Electronically Signed   By: Donavan Foil M.D.   On: 08/03/2021 16:10   DG HIP UNILAT WITH PELVIS 1V RIGHT  Result Date: 08/03/2021 CLINICAL DATA:  Total right hip arthroplasty. EXAM: DG HIP (WITH OR WITHOUT PELVIS) 1V RIGHT COMPARISON:  06/06/2018 radiographs and CT  scan 07/16/2021 FINDINGS: Well seated components of a total right hip arthroplasty. No complicating features are identified. IMPRESSION: Well seated components of a total right hip arthroplasty. Electronically Signed   By: Marijo Sanes M.D.   On: 08/03/2021 14:45   DG C-Arm 1-60 Min-No Report  Result Date: 08/03/2021 Fluoroscopy was utilized by the requesting physician.  No radiographic interpretation.   DG C-Arm 1-60 Min-No Report  Result Date: 08/03/2021 Fluoroscopy was utilized by the requesting physician.  No radiographic interpretation.   CT 3D RECON AT SCANNER  Result Date: 07/16/2021 CLINICAL DATA:  Nonspecific (abnormal) findings on radiological and  other examination of musculoskeletal system right hip osteoarthritis. EXAM: 3-DIMENSIONAL CT IMAGE RENDERING ON ACQUISITION WORKSTATION TECHNIQUE: 3-dimensional CT images were rendered by post-processing of the original CT data on an acquisition workstation. The 3-dimensional CT images were interpreted and findings were reported in the accompanying complete CT report for this study COMPARISON:  None Available. FINDINGS: 3D imaging finding consistent with advanced right hip osteoarthritis as described in the previous report. IMPRESSION: Advanced right hip osteoarthritis. Electronically Signed   By: Keane Police D.O.   On: 07/16/2021 13:58   CT HIP RIGHT WO CONTRAST  Result Date: 07/16/2021 CLINICAL DATA:  Right hip pain, preop. EXAM: CT OF THE RIGHT HIP WITHOUT CONTRAST TECHNIQUE: Multidetector CT imaging of the right hip was performed according to the standard protocol. Multiplanar CT image reconstructions were also generated. RADIATION DOSE REDUCTION: This exam was performed according to the departmental dose-optimization program which includes automated exposure control, adjustment of the mA and/or kV according to patient size and/or use of iterative reconstruction technique. COMPARISON:  Radiographs dated Jun 06, 2018 FINDINGS: Bones/Joint/Cartilage Advanced right hip osteoarthritis with complete loss of the articular cartilage, bone-on-bone articulation and subchondral cystic changes. There is osseous remodeling of the acetabulum. There are bulky marginal osteophytes as well as heterotopic ossification. Ligaments Suboptimally assessed by CT. Muscles and Tendons Muscles are normal in bulk. No intramuscular hematoma or fluid collection. Enthesopathy of the hamstring origin. Soft tissues Skin and subcutaneous soft tissues are within normal limits. IMPRESSION: 1. Advanced right hip osteoarthritis with bone-on-bone articulation, subchondral cystic changes and bulky osteophytes. Osseous remodeling of the acetabulum  as well as heterotopic ossification. This may be sequela of developmental dysplasia of the hip or reactive arthritis secondary to trauma. 2.  Hamstring origin enthesopathy. 3.  Skin and subcutaneous soft tissues are unremarkable. Electronically Signed   By: Keane Police D.O.   On: 07/16/2021 13:30    EKG: Orders placed or performed during the hospital encounter of 06/28/19   ED EKG   ED EKG   EKG 12-Lead   EKG 12-Lead   EKG     Hospital Course: Josias Tomerlin is a 61 y.o. who was admitted to [facility]. They were brought to the operating room on 08/03/2021 and underwent Procedure(s): [place].  Patient tolerated the procedure well and was later transferred to the recovery room and then to the orthopaedic floor for postoperative care. They were given PO and IV analgesics for pain control following their surgery. They were given 24 hours of postoperative antibiotics of  Anti-infectives (From admission, onward)    Start     Dose/Rate Route Frequency Ordered Stop   08/03/21 2000  ceFAZolin (ANCEF) IVPB 2g/100 mL premix        2 g 200 mL/hr over 30 Minutes Intravenous Every 6 hours 08/03/21 1749 08/04/21 0307   08/03/21 1030  ceFAZolin (ANCEF) IVPB  2g/100 mL premix        2 g 200 mL/hr over 30 Minutes Intravenous On call to O.R. 08/03/21 1022 08/03/21 1244      and started on DVT prophylaxis in the form of Xarelto.   PT and OT were ordered for total joint protocol. Discharge planning consulted to help with postop disposition and equipment needs. Patient had a good night on the evening of surgery. They started to get up OOB with therapy on POD #0. Patient was seen in rounds on POD #1. One time radiation treatment was ordered for prevention of heterotopic ossification. Continued to work with therapy into POD #2. Received radiation treatment on POD #2. Pt was seen during rounds on day two and was ready to go home pending progress with therapy. Pt worked with  therapy for two additional sessions and was meeting their goals. he was discharged to home later that day in stable condition.  Diet: Regular diet Activity: WBAT Follow-up: in 2 weeks Disposition: Home Discharged Condition: good   Discharge Instructions     Call MD / Call 911   Complete by: As directed    If you experience chest pain or shortness of breath, CALL 911 and be transported to the hospital emergency room.  If you develope a fever above 101 F, pus (white drainage) or increased drainage or redness at the wound, or calf pain, call your surgeon's office.   Change dressing   Complete by: As directed    Maintain surgical dressing until follow up in the clinic. If the edges start to pull up, may reinforce with tape. If the dressing is no longer working, may remove and cover with gauze and tape, but must keep the area dry and clean.  Call with any questions or concerns.   Constipation Prevention   Complete by: As directed    Drink plenty of fluids.  Prune juice may be helpful.  You may use a stool softener, such as Colace (over the counter) 100 mg twice a day.  Use MiraLax (over the counter) for constipation as needed.   Diet - low sodium heart healthy   Complete by: As directed    Increase activity slowly as tolerated   Complete by: As directed    Weight bearing as tolerated with assist device (walker, cane, etc) as directed, use it as long as suggested by your surgeon or therapist, typically at least 4-6 weeks.   Post-operative opioid taper instructions:   Complete by: As directed    POST-OPERATIVE OPIOID TAPER INSTRUCTIONS: It is important to wean off of your opioid medication as soon as possible. If you do not need pain medication after your surgery it is ok to stop day one. Opioids include: Codeine, Hydrocodone(Norco, Vicodin), Oxycodone(Percocet, oxycontin) and hydromorphone amongst others.  Long term and even short term use of opiods can cause: Increased pain  response Dependence Constipation Depression Respiratory depression And more.  Withdrawal symptoms can include Flu like symptoms Nausea, vomiting And more Techniques to manage these symptoms Hydrate well Eat regular healthy meals Stay active Use relaxation techniques(deep breathing, meditating, yoga) Do Not substitute Alcohol to help with tapering If you have been on opioids for less than two weeks and do not have pain than it is ok to stop all together.  Plan to wean off of opioids This plan should start within one week post op of your joint replacement. Maintain the same interval or time between taking each dose and first decrease the dose.  Cut  the total daily intake of opioids by one tablet each day Next start to increase the time between doses. The last dose that should be eliminated is the evening dose.      TED hose   Complete by: As directed    Use stockings (TED hose) for 2 weeks on both leg(s).  You may remove them at night for sleeping.      Allergies as of 08/05/2021   No Known Allergies      Medication List     TAKE these medications    allopurinol 100 MG tablet Commonly known as: ZYLOPRIM Take 2 tablets (200 mg total) by mouth daily.   atorvastatin 40 MG tablet Commonly known as: LIPITOR Take 1 tablet (40 mg total) by mouth daily.   digoxin 0.125 MG tablet Commonly known as: LANOXIN TAKE ONE TABLET BY MOUTH DAILY What changed: when to take this   docusate sodium 100 MG capsule Commonly known as: COLACE Take 1 capsule (100 mg total) by mouth 2 (two) times daily.   folic acid 1 MG tablet Commonly known as: FOLVITE Take 1 tablet (1 mg total) by mouth daily.   HYDROcodone-acetaminophen 5-325 MG tablet Commonly known as: NORCO/VICODIN Take 1-2 tablets by mouth every 4 (four) hours as needed for severe pain. Start with 1 tablet every 4 hours as needed. Take 2 only for severe pain.   lisinopril 20 MG tablet Commonly known as: ZESTRIL Take 1  tablet (20 mg total) by mouth daily.   methocarbamol 500 MG tablet Commonly known as: ROBAXIN Take 1 tablet (500 mg total) by mouth every 6 (six) hours as needed for muscle spasms.   metoprolol succinate 25 MG 24 hr tablet Commonly known as: TOPROL-XL Take 1 tablet (25 mg total) by mouth daily.   ondansetron 4 MG tablet Commonly known as: ZOFRAN Take 1 tablet (4 mg total) by mouth every 6 (six) hours as needed for nausea.   pantoprazole 40 MG tablet Commonly known as: PROTONIX Take 1 tablet (40 mg total) by mouth 2 (two) times daily.   polyethylene glycol 17 g packet Commonly known as: MIRALAX / GLYCOLAX Take 17 g by mouth daily as needed for mild constipation.   Xarelto 20 MG Tabs tablet Generic drug: rivaroxaban TAKE ONE TABLET BY MOUTH DAILY WITH SUPPER               Discharge Care Instructions  (From admission, onward)           Start     Ordered   08/05/21 0000  Change dressing       Comments: Maintain surgical dressing until follow up in the clinic. If the edges start to pull up, may reinforce with tape. If the dressing is no longer working, may remove and cover with gauze and tape, but must keep the area dry and clean.  Call with any questions or concerns.   08/05/21 0741            Follow-up Information     Paralee Cancel, MD. Schedule an appointment as soon as possible for a visit in 2 week(s).   Specialty: Orthopedic Surgery Contact information: [address] [place] [address] [phone]                 Signed: Griffith Citron, PA-C Orthopedic Surgery 08/11/2021, 1:03 PM

## 2021-08-18 NOTE — Progress Notes (Signed)
  Radiation Oncology         [phone] ________________________________  Name: Robert [name] MRN: [id number]  Date: 08/05/2021  DOB: 1960/03/06  End of Treatment Note  Diagnosis:   Postoperative heterotopic ossification     Indication for treatment:  curative       Radiation treatment date:   08/05/21  Site/dose:   The patient was treated to a dose of 7 Gy to the right hip. This was accomplished with AP and PA fields.  Narrative: The patient tolerated radiation treatment relatively well.   No difficulties.  Plan: The patient has completed radiation treatment. The patient will return to radiation oncology clinic on an as needed basis. ________________________________     Carola Rhine, PAC

## 2021-08-20 DIAGNOSIS — Z5189 Encounter for other specified aftercare: Secondary | ICD-10-CM | POA: Diagnosis not present

## 2021-09-22 DIAGNOSIS — Z5189 Encounter for other specified aftercare: Secondary | ICD-10-CM | POA: Diagnosis not present

## 2021-09-23 NOTE — Therapy (Unsigned)
OUTPATIENT PHYSICAL THERAPY LOWER EXTREMITY EVALUATION   Patient Name: Aristides Luckey MRN: [id number] DOB:09-23-1960, 61 y.o., male Today's Date: 09/28/2021   PT End of Session - 09/28/21 1631     Visit Number 1    Number of Visits 16    Date for PT Re-Evaluation 11/23/21    PT Start Time 4403    PT Stop Time 1708    PT Time Calculation (min) 45 min             Past Medical History:  Diagnosis Date   Alcohol abuse    ongoing as of 07/2017.  Quit after CVA 05/2018   Arthritis    Atrial fibrillation ([place])    Not candidate for anticoagulation b/c of GI bleeding; had to get transfused.  Then, pt had CVA 05/2018 and he was put on xarelto and he quit drinking.   Coronary atherosclerosis    Noted on lung cancer screening CT   COVID-19 virus infection 06/2019   CVA (cerebral vascular accident) ([place]) 05/2018   large R MCA territory infarct.  CTA neg for large cerebral vessel occlusion.  R ICA 70% stenosis, L clear. Rpt CTA neck ordered by neuro as of 01/2019.   Gastric ulcer    hx of--unclear (pt and wife deny this)   Gout    Usually MTP joint   History of adenomatous polyp of colon 2021   recall 2024   History of digital clubbing    "all my adult life"--CXR 08/2016 = bronchitic changes o/w normal.   Hyperkalemia 04/2017   Normalized with decreasing lisinopril from 40 mg qd to 20 mg qd.   Hyperlipidemia 07/2016   Holding off on statin until pt quits drinking.  Back on statin after CVA 05/2018   Hypertension    Nonischemic cardiomyopathy ([place]) 2015; 2020   2015: "resolved" (w/medical therapy?).  05/2018 EF 35-40%, with TR and MR and ++LAE. 05/2019 EF 50-52%, low normal LV wall motion, grd II DD, mod MR and mod TR, mild pulm HTN.   Nonrheumatic mitral valve regurgitation    Osteoarthritis of right hip    Surgery being planned as of 05/2021 consult with Dr. Alvan Dame   Paralysis [facility])    some left side deficit from CVA in 2020   Pre-diabetes    Screening for lung cancer    04/2021 CT  showed no lesions/nodules.  Repeat 1 year   Sleep apnea    Tobacco abuse    Quit 2014   Past Surgical History:  Procedure Laterality Date   CARDIAC CATHETERIZATION  2015   Normal coronaries   CARDIOVERSION     DCCV 2015   COLONOSCOPY  08/09/2019   One large adenoma-->recall 3 yrs   TONSILLECTOMY     TOTAL HIP ARTHROPLASTY Right 08/03/2021   Procedure: TOTAL HIP ARTHROPLASTY ANTERIOR APPROACH;  Surgeon: Paralee Cancel, MD;  Location: [facility];  Service: Orthopedics;  Laterality: Right;   TRANSTHORACIC ECHOCARDIOGRAM  06/08/2018; 05/2019   05/2018 (in context of acute CVA): EF 35-40%, mild/mod MR, mod/sev TR, severely dilated LA.  05/2019 EF 50-52%, low normal LV wall motion, grd II DD, mod MR and mod TR, mild pulm HTN.   Patient Active Problem List   Diagnosis Date Noted   S/P total right hip arthroplasty 08/03/2021   Atrial fibrillation with RVR ([place]) 06/29/2019   General weakness 06/29/2019   Encounter for orogastric (OG) tube placement    AKI (acute kidney injury) ([place])    Acute idiopathic gout of right  ankle    Leukocytosis    Oropharyngeal dysphagia    Family history of lower GI bleeding    Altered mental status    Endotracheal tube present    History of CVA with residual deficit 06/06/2018   Alcohol abuse 06/06/2018   Hyperglycemia 08/02/2016   Type II diabetes mellitus ([place]) 07/10/2016   Hyperlipidemia 03/19/2015   Hip arthritis 02/12/2015   Acute posthemorrhagic anemia 02/24/2013   Anemia 02/24/2013   Hypertension 02/24/2013   A-fib ([place]) 02/06/2013   Tricuspid regurgitation 02/06/2013   Non-ischemic cardiomyopathy ([place]) 02/06/2013   Febrile 02/05/2013   Nondependent alcohol abuse, continuous drinking behavior 02/05/2013    PCP: Dr Shawnie Dapper  REFERRING PROVIDER: Irving Copas, PA C Dr Paralee Cancel  REFERRING DIAG: Rt THA - anterior approach  THERAPY DIAG:  Pain in right hip  Other symptoms and signs involving the musculoskeletal system  Other  abnormalities of gait and mobility  Muscle weakness (generalized)  Rationale for Evaluation and Treatment Rehabilitation  ONSET DATE: 08/03/21  SUBJECTIVE:   SUBJECTIVE STATEMENT: Patient reports that he has had Rt hip pain over the past 10 years with progressive increase in symptoms in the past 1-2 years. He is walking in his home with cane. Some weakness on Lt side from stroke in 2020 and still has difficulty with swallowing.  PERTINENT HISTORY: CVA 05/2018; Arthritis; AODM; elevated cholesterol; A-fib; bilat hearing loss   PAIN:  Are you having pain? Yes: NPRS scale: 0/10 Pain location: hip  Pain description: none Aggravating factors: prolonged standing or walking  Relieving factors: rest   PRECAUTIONS: Anterior hip  WEIGHT BEARING RESTRICTIONS No  FALLS:  Has patient fallen in last 6 months? 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 Advanced right hip osteoarthritis with bone-on-bone articulation,subchondral cystic changes and bulky osteophytes. Osseous remodeling of the acetabulum as well as heterotopic ossification. This may be sequela of developmental dysplasia of the hip or reactive arthritis secondary to trauma. 2.  Hamstring origin enthesopathy. 3.  Skin and subcutaneous soft tissues are unremarkable.  PATIENT SURVEYS:  FOTO 36  COGNITION:  Overall cognitive status: Within functional limits  for tasks assessed     SENSATION: WFL   MUSCLE LENGTH: Hamstrings: Right 40 deg; Left 45 deg Thomas test: unable to preform due to stiffness and tightness in bilat hips Rt > Lt  POSTURE: forward head, decreased lumbar lordosis, increased thoracic kyphosis, weight shift left  PALPATION: Significant muscular tightness to palpation in the Rt anterior hip in area of incision as well as proximal hip and proximal thigh anteriorly and laterally.   LOWER EXTREMITY ROM: assessed with pt in supine and sitting   Active ROM Right eval Left eval  Hip flexion 80 100  Hip extension -20 -10  Hip abduction 15 23  Hip adduction neutral neutral  Hip internal rotation  Tight  Tight   Hip external rotation Tight  Tight   Knee flexion 90 100  Knee extension -10 -10  Ankle dorsiflexion    Ankle plantarflexion    Ankle inversion    Ankle eversion     (Blank rows = not tested)  LOWER EXTREMITY MMT:  assessed with patient in supine and sitting  MMT Right eval Left eval  Hip flexion 3+/5 4+/5  Hip extension 3/5 3/5  Hip abduction 3/5 3/5  Hip adduction    Hip internal rotation    Hip external rotation    Knee flexion 4+/5 5/5  Knee extension 4+/5 5/5   Ankle dorsiflexion    Ankle plantarflexion    Ankle inversion    Ankle eversion     (Blank rows = not tested)   GAIT: Distance walked: 40 Assistive device utilized: Single point cane Level of assistance: Complete Independence Comments: abnormal gait pattern related to Rt THA and Lt hemiparesis    TODAY'S TREATMENT: Therapeutic exercise: Quad set with towel behind knee 5 sec hold x 10 Hamstring stretch w/strap 30 sec x 2 Heel side with strap for hip/knee flexion x 5  Glut set supine 5 sec x 5 Bridge 5 sec hold x 5  Sit to stand x 5   Placed 3/8 in heel lift in Rt shoe to improve leg length therefore improving standing alignment and gait pattern   PATIENT EDUCATION:  Education details: POC; HEP Person educated:  Patient Education method: Consulting civil engineer, Media planner, Corporate treasurer cues, Verbal cues, and Handouts Education comprehension: verbalized understanding, returned demonstration, verbal cues required, tactile cues required, and needs further education   HOME EXERCISE PROGRAM: Access Code: [email or web address] URL: [email or web address] Date: 09/28/2021 Prepared by: Gillermo Murdoch  Exercises - Supine Quad Set  - 2 x daily - 7 x weekly - 1 sets - 10 reps - 5 sec  hold - Hooklying Hamstring Stretch with Strap  - 2 x daily - 7 x weekly - 1 sets - 3 reps - 30 sec  hold - Hooklying Gluteal Sets  - 2 x daily - 7 x weekly - 1 sets - 10 reps - 5 sec  hold - Beginner Bridge  - 2 x daily - 7 x weekly - 1 sets - 10 reps - 5 sec  hold - Supine Heel Slide with Strap  - 2 x daily - 7 x weekly - 1 sets - 10 reps - 10 sec  hold - Sit to Stand with Armchair  - 2 x daily - 7 x weekly - 1 sets - 10 reps - 5 sec  hold  ASSESSMENT:  CLINICAL IMPRESSION: Patient is a 61 y.o. male who was seen today for physical therapy evaluation and treatment for rehab following Rt THA - anterior approach 08/03/21. He has residual hemiparesis with abnormal tone Lt U/LE's following CVA 5/20. Patient did not have home health PT following Rt THA and presents today with poor posture and alignment; significant leg length difference with Rt LE shorter than Lt. He has severe stiffness and decreased mobility/ROM in Rt hip; weakness; decreased transfers, decreased balance; abnormal gait pattern; sedentary lifestyle. Patient will benefit from PT to address problems identified.    OBJECTIVE IMPAIRMENTS decreased activity tolerance, decreased balance, decreased endurance, decreased mobility, difficulty walking, decreased ROM, decreased strength, increased muscle spasms, impaired flexibility, improper body mechanics, postural dysfunction, and pain.   ACTIVITY LIMITATIONS carrying, lifting, bending, sitting, standing, squatting, stairs, transfers, bed  mobility, bathing, toileting, dressing, and locomotion level  PARTICIPATION  LIMITATIONS: driving, shopping, community activity, occupation, and yard work  PERSONAL FACTORS 3+ comorbidities: arthritis; a-fib; AODM; hypertension; CVA with Lt hemiparesis are also affecting patient's functional outcome.   REHAB POTENTIAL: Good  CLINICAL DECISION MAKING: Stable/uncomplicated  EVALUATION COMPLEXITY: Low   GOALS: Goals reviewed with patient? Yes   LONG TERM GOALS: Target date: 11/23/2021   Improve ROM/mobility/tissue extensibility Rt hip and LE to allow patient to achieve 95 to 100 deg hip flexion and 0 degrees hip extension  Baseline:  Goal status: INITIAL  2.  Improve strength Rt LE to 4+/5 throughout  Baseline:  Goal status: INITIAL  3.  Improve gait pattern with patient to demonstrate reciprocal gait pattern with more upright posture and alignment  Baseline:  Goal status: INITIAL  4.  Independent in all transitional movements and transfers  Baseline:  Goal status: INITIAL  5.  Independent in HEP (including aquatic program as indicated) Baseline:  Goal status: INITIAL  6.  Improve functional limitation score to 61 Baseline:  Goal status: INITIAL   PLAN: PT FREQUENCY: 2x/week  PT DURATION: 8 weeks  PLANNED INTERVENTIONS: Therapeutic exercises, Therapeutic activity, Neuromuscular re-education, Balance training, Gait training, Patient/Family education, Self Care, DME instructions, Aquatic Therapy, Dry Needling, Electrical stimulation, Cryotherapy, Moist heat, Taping, Ultrasound, Manual therapy, and Re-evaluation  PLAN FOR NEXT SESSION: review and progress exercises; assess response to heel insert; practice transfers and transitional movements; work on posture and alignment in standing; gait training; manual work as indicated to anterior lateral Rt hip musculature   Drayson Dorko Nilda Simmer, PT, MPH  09/28/2021, 5:39 PM

## 2021-09-27 ENCOUNTER — Other Ambulatory Visit: Payer: Self-pay | Admitting: Family Medicine

## 2021-09-27 DIAGNOSIS — I10 Essential (primary) hypertension: Secondary | ICD-10-CM

## 2021-09-27 NOTE — Telephone Encounter (Signed)
Clinical pharmacist contacted me to request refill be sent in for [facility] for his lisinopril.  I have pended this order--> please verify pharmacy with patient.  Robert [name] will you call Robert [name] and have him make an appointment for routine follow-up with me?   Marland Kitchen

## 2021-09-28 ENCOUNTER — Encounter: Payer: Self-pay | Admitting: Rehabilitative and Restorative Service Providers"

## 2021-09-28 ENCOUNTER — Ambulatory Visit: Payer: Medicare HMO | Attending: Student | Admitting: Rehabilitative and Restorative Service Providers"

## 2021-09-28 ENCOUNTER — Other Ambulatory Visit: Payer: Self-pay

## 2021-09-28 DIAGNOSIS — M6281 Muscle weakness (generalized): Secondary | ICD-10-CM | POA: Diagnosis not present

## 2021-09-28 DIAGNOSIS — R29898 Other symptoms and signs involving the musculoskeletal system: Secondary | ICD-10-CM | POA: Diagnosis not present

## 2021-09-28 DIAGNOSIS — M25551 Pain in right hip: Secondary | ICD-10-CM | POA: Insufficient documentation

## 2021-09-28 DIAGNOSIS — R2689 Other abnormalities of gait and mobility: Secondary | ICD-10-CM | POA: Insufficient documentation

## 2021-09-28 NOTE — Telephone Encounter (Signed)
LM for patient regarding medication. Pt needs follow up with provider

## 2021-10-01 ENCOUNTER — Ambulatory Visit: Payer: Medicare HMO | Admitting: Physical Therapy

## 2021-10-01 ENCOUNTER — Encounter: Payer: Self-pay | Admitting: Physical Therapy

## 2021-10-01 ENCOUNTER — Telehealth: Payer: Self-pay | Admitting: *Deleted

## 2021-10-01 ENCOUNTER — Encounter: Payer: Self-pay | Admitting: *Deleted

## 2021-10-01 DIAGNOSIS — R2689 Other abnormalities of gait and mobility: Secondary | ICD-10-CM | POA: Diagnosis not present

## 2021-10-01 DIAGNOSIS — M25551 Pain in right hip: Secondary | ICD-10-CM

## 2021-10-01 DIAGNOSIS — M6281 Muscle weakness (generalized): Secondary | ICD-10-CM

## 2021-10-01 DIAGNOSIS — R29898 Other symptoms and signs involving the musculoskeletal system: Secondary | ICD-10-CM | POA: Diagnosis not present

## 2021-10-01 NOTE — Telephone Encounter (Signed)
Attempted to contact pt regarding medication. Unable to LVM

## 2021-10-01 NOTE — Therapy (Signed)
OUTPATIENT PHYSICAL THERAPY   Patient Name: Robert [name] MRN: [id number] DOB:December 22, 1960, 61 y.o., male Today's Date: 10/01/2021   PT End of Session - 10/01/21 1011     Visit Number 2    Number of Visits 16    Date for PT Re-Evaluation 11/23/21    PT Start Time 0930    PT Stop Time 1012    PT Time Calculation (min) 42 min    Activity Tolerance Patient tolerated treatment well    Behavior During Therapy [facility] for tasks assessed/performed              Past Medical History:  Diagnosis Date   Alcohol abuse    ongoing as of 07/2017.  Quit after CVA 05/2018   Arthritis    Atrial fibrillation ([place])    Not candidate for anticoagulation b/c of GI bleeding; had to get transfused.  Then, pt had CVA 05/2018 and he was put on xarelto and he quit drinking.   Coronary atherosclerosis    Noted on lung cancer screening CT   COVID-19 virus infection 06/2019   CVA (cerebral vascular accident) ([place]) 05/2018   large R MCA territory infarct.  CTA neg for large cerebral vessel occlusion.  R ICA 70% stenosis, L clear. Rpt CTA neck ordered by neuro as of 01/2019.   Gastric ulcer    hx of--unclear (pt and wife deny this)   Gout    Usually MTP joint   History of adenomatous polyp of colon 2021   recall 2024   History of digital clubbing    "all my adult life"--CXR 08/2016 = bronchitic changes o/w normal.   Hyperkalemia 04/2017   Normalized with decreasing lisinopril from 40 mg qd to 20 mg qd.   Hyperlipidemia 07/2016   Holding off on statin until pt quits drinking.  Back on statin after CVA 05/2018   Hypertension    Nonischemic cardiomyopathy ([place]) 2015; 2020   2015: "resolved" (w/medical therapy?).  05/2018 EF 35-40%, with TR and MR and ++LAE. 05/2019 EF 50-52%, low normal LV wall motion, grd II DD, mod MR and mod TR, mild pulm HTN.   Nonrheumatic mitral valve regurgitation    Osteoarthritis of right hip    Surgery being planned as of 05/2021 consult with Dr. Alvan Dame   Paralysis [facility])    some left  side deficit from CVA in 2020   Pre-diabetes    Screening for lung cancer    04/2021 CT showed no lesions/nodules.  Repeat 1 year   Sleep apnea    Tobacco abuse    Quit 2014   Past Surgical History:  Procedure Laterality Date   CARDIAC CATHETERIZATION  2015   Normal coronaries   CARDIOVERSION     DCCV 2015   COLONOSCOPY  08/09/2019   One large adenoma-->recall 3 yrs   TONSILLECTOMY     TOTAL HIP ARTHROPLASTY Right 08/03/2021   Procedure: TOTAL HIP ARTHROPLASTY ANTERIOR APPROACH;  Surgeon: Paralee Cancel, MD;  Location: [facility];  Service: Orthopedics;  Laterality: Right;   TRANSTHORACIC ECHOCARDIOGRAM  06/08/2018; 05/2019   05/2018 (in context of acute CVA): EF 35-40%, mild/mod MR, mod/sev TR, severely dilated LA.  05/2019 EF 50-52%, low normal LV wall motion, grd II DD, mod MR and mod TR, mild pulm HTN.   Patient Active Problem List   Diagnosis Date Noted   S/P total right hip arthroplasty 08/03/2021   Atrial fibrillation with RVR ([place]) 06/29/2019   General weakness 06/29/2019   Encounter for orogastric (OG) tube  placement    AKI (acute kidney injury) ([place])    Acute idiopathic gout of right ankle    Leukocytosis    Oropharyngeal dysphagia    Family history of lower GI bleeding    Altered mental status    Endotracheal tube present    History of CVA with residual deficit 06/06/2018   Alcohol abuse 06/06/2018   Hyperglycemia 08/02/2016   Type II diabetes mellitus ([place]) 07/10/2016   Hyperlipidemia 03/19/2015   Hip arthritis 02/12/2015   Acute posthemorrhagic anemia 02/24/2013   Anemia 02/24/2013   Hypertension 02/24/2013   A-fib ([place]) 02/06/2013   Tricuspid regurgitation 02/06/2013   Non-ischemic cardiomyopathy ([place]) 02/06/2013   Febrile 02/05/2013   Nondependent alcohol abuse, continuous drinking behavior 02/05/2013    PCP: Dr Shawnie Dapper  REFERRING PROVIDER: Irving Copas, PA C Dr Paralee Cancel  REFERRING DIAG: Rt THA - anterior approach  THERAPY DIAG:  Pain in  right hip  Other symptoms and signs involving the musculoskeletal system  Other abnormalities of gait and mobility  Muscle weakness (generalized)  Rationale for Evaluation and Treatment Rehabilitation  ONSET DATE: 08/03/21  SUBJECTIVE:   SUBJECTIVE STATEMENT: Patient states "I am more stiff because it is the morning". He states he has no pain, just feels stiff until he "gets moving"  PERTINENT HISTORY: CVA 05/2018; Arthritis; AODM; elevated cholesterol; A-fib; bilat hearing loss   PAIN:  Are you having pain? Yes: NPRS scale: 0/10 Pain location: hip  Pain description: none Aggravating factors: prolonged standing or walking  Relieving factors: rest   PRECAUTIONS: Anterior hip  WEIGHT BEARING RESTRICTIONS No   PATIENT GOALS wants to get out and walk and exercise - has a side walk that is level   OBJECTIVE:   DIAGNOSTIC FINDINGS: CT Rt hip 07/16/21 - 1. Advanced right hip osteoarthritis with bone-on-bone articulation,subchondral cystic changes and bulky osteophytes. Osseous remodeling of the acetabulum as well as heterotopic ossification. This may be sequela of developmental dysplasia of the hip or reactive arthritis secondary to trauma. 2.  Hamstring origin enthesopathy. 3.  Skin and subcutaneous soft tissues are unremarkable.  PATIENT SURVEYS:  FOTO 36  COGNITION:  Overall cognitive status: Within functional limits for tasks assessed     SENSATION: WFL   MUSCLE LENGTH: Hamstrings: Right 40 deg; Left 45 deg Thomas test: unable to preform due to stiffness and tightness in bilat hips Rt > Lt  POSTURE: forward head, decreased lumbar lordosis, increased thoracic kyphosis, weight shift left  PALPATION: Significant muscular tightness to palpation in the Rt anterior hip in area of incision as well as proximal hip and proximal thigh anteriorly and laterally.   LOWER EXTREMITY ROM: assessed with pt in supine and sitting   Active ROM Right eval Left eval  Hip flexion  80 100  Hip extension -20 -10  Hip abduction 15 23  Hip adduction neutral neutral  Hip internal rotation Tight  Tight   Hip external rotation Tight  Tight   Knee flexion 90 100  Knee extension -10 -10  Ankle dorsiflexion    Ankle plantarflexion    Ankle inversion    Ankle eversion     (Blank rows = not tested)  LOWER EXTREMITY MMT:  assessed with patient in supine and sitting  MMT Right eval Left eval  Hip flexion 3+/5 4+/5  Hip extension 3/5 3/5  Hip abduction 3/5 3/5  Hip adduction    Hip internal rotation    Hip external rotation    Knee flexion 4+/5 5/5  Knee extension 4+/5 5/5   Ankle dorsiflexion    Ankle plantarflexion    Ankle inversion    Ankle eversion     (Blank rows = not tested)   GAIT: Distance walked: 40 Assistive device utilized: Single point cane Level of assistance: Complete Independence Comments: abnormal gait pattern related to Rt THA and Lt hemiparesis    TODAY'S TREATMENT: 10/01/21 Nustep L5 x 5 min for warm up  Standing: Step up 6'' step forward and laterally x 10 each Hip abd x 20 yellow TB Hip ext x 20 yellow TB - max cuing for posture HS curl yellow TB x 20  Supine: Bridge 10 x 5 sec hold  Seated: Sit to stand multiple reps with facilitation for forward wt shift and alignment. Focus on forward flexion to improve hip ROM  Manual: Hip joint mobs to improve flexion grade 2-3 PROM Rt hip all directions to tolerance and tissue limits  Eval: Therapeutic exercise: Quad set with towel behind knee 5 sec hold x 10 Hamstring stretch w/strap 30 sec x 2 Heel side with strap for hip/knee flexion x 5  Glut set supine 5 sec x 5 Bridge 5 sec hold x 5  Sit to stand x 5   Placed 3/8 in heel lift in Rt shoe to improve leg length therefore improving standing alignment and gait pattern   PATIENT EDUCATION:  Education details: POC; HEP Person educated: Patient Education method: Consulting civil engineer, Media planner, Corporate treasurer cues, Verbal cues, and  Handouts Education comprehension: verbalized understanding, returned demonstration, verbal cues required, tactile cues required, and needs further education   HOME EXERCISE PROGRAM: Access Code: [email or web address] URL: [email or web address] Date: 09/28/2021 Prepared by: Gillermo Murdoch  Exercises - Supine Quad Set  - 2 x daily - 7 x weekly - 1 sets - 10 reps - 5 sec  hold - Hooklying Hamstring Stretch with Strap  - 2 x daily - 7 x weekly - 1 sets - 3 reps - 30 sec  hold - Hooklying Gluteal Sets  - 2 x daily - 7 x weekly - 1 sets - 10 reps - 5 sec  hold - Beginner Bridge  - 2 x daily - 7 x weekly - 1 sets - 10 reps - 5 sec  hold - Supine Heel Slide with Strap  - 2 x daily - 7 x weekly - 1 sets - 10 reps - 10 sec  hold - Sit to Stand with Armchair  - 2 x daily - 7 x weekly - 1 sets - 10 reps - 5 sec  hold  ASSESSMENT:  CLINICAL IMPRESSION: Pt with good response to progression of strengthening. Good tolerance to Rt hip jt mobs. Requires max cuing and facilitation for hip and trunk flexion with sit <> stand   GOALS: Goals reviewed with patient? Yes   LONG TERM GOALS: Target date: 11/23/2021   Improve ROM/mobility/tissue extensibility Rt hip and LE to allow patient to achieve 95 to 100 deg hip flexion and 0 degrees hip extension  Baseline:  Goal status: INITIAL  2.  Improve strength Rt LE to 4+/5 throughout  Baseline:  Goal status: INITIAL  3.  Improve gait pattern with patient to demonstrate reciprocal gait pattern with more upright posture and alignment  Baseline:  Goal status: INITIAL  4.  Independent in all transitional movements and transfers  Baseline:  Goal status: INITIAL  5.  Independent in HEP (including aquatic program as indicated) Baseline:  Goal status: INITIAL  6.  Improve functional limitation score to  61 Baseline:  Goal status: INITIAL   PLAN: PT FREQUENCY: 2x/week  PT DURATION: 8 weeks  PLANNED INTERVENTIONS: Therapeutic exercises, Therapeutic  activity, Neuromuscular re-education, Balance training, Gait training, Patient/Family education, Self Care, DME instructions, Aquatic Therapy, Dry Needling, Electrical stimulation, Cryotherapy, Moist heat, Taping, Ultrasound, Manual therapy, and Re-evaluation  PLAN FOR NEXT SESSION: review and progress exercises; practice transfers and transitional movements; work on posture and alignment in standing; gait training; manual work as indicated to anterior lateral Rt hip musculature   Nathasha Fiorillo, PT 10/01/2021, 10:12 AM

## 2021-10-01 NOTE — Patient Outreach (Signed)
  Care Coordination   Initial Visit Note   10/01/2021 Name: Robert [name] MRN: [id number] DOB: 23-Dec-1960  Robert [name] is a 61 y.o. year old male who sees McGowen, Robert Blackwater, MD for primary care. I spoke with  Robert [name] by phone today.  What matters to the patients health and wellness today?  No needs    Goals Addressed               This Visit's Progress     COMPLETED: No needs (pt-stated)        Care Coordination Interventions: Reviewed medications with patient and discussed adherence to all medications Reviewed scheduled/upcoming provider appointments including pending appointments and verified AWV completed on 04/10/2021 Screening for signs and symptoms of depression related to chronic disease state  Assessed social determinant of health barriers         SDOH assessments and interventions completed:  Yes  SDOH Interventions Today    Flowsheet Row Most Recent Value  SDOH Interventions   Food Insecurity Interventions Intervention Not Indicated  Housing Interventions Intervention Not Indicated  Transportation Interventions Intervention Not Indicated  Utilities Interventions Intervention Not Indicated        Care Coordination Interventions Activated:  Yes  Care Coordination Interventions:  Yes, provided   Follow up plan: No further intervention required.   Encounter Outcome:  Pt. Visit Completed

## 2021-10-01 NOTE — Patient Instructions (Signed)
Visit Information  Thank you for taking time to visit with me today. Please don't hesitate to contact me if I can be of assistance to you.   Following are the goals we discussed today:   Goals Addressed               This Visit's Progress     COMPLETED: No needs (pt-stated)        Care Coordination Interventions: Reviewed medications with patient and discussed adherence to all medications Reviewed scheduled/upcoming provider appointments including pending appointments and verified AWV completed on 04/10/2021 Screening for signs and symptoms of depression related to chronic disease state  Assessed social determinant of health barriers         Visit Information  Thank you for taking time to visit with me today. Please don't hesitate to contact me if I can be of assistance to you.   Following are the goals we discussed today:   Goals Addressed               This Visit's Progress     COMPLETED: No needs (pt-stated)        Care Coordination Interventions: Reviewed medications with patient and discussed adherence to all medications Reviewed scheduled/upcoming provider appointments including pending appointments and verified AWV completed on 04/10/2021 Screening for signs and symptoms of depression related to chronic disease state  Assessed social determinant of health barriers         Please call the care guide team at [phone] if you need to cancel or reschedule your appointment.   If you are experiencing a Mental Health or [place] or need someone to talk to, please call the Suicide and Crisis Lifeline: 988  The patient verbalized understanding of instructions, educational materials, and care plan provided today and DECLINED offer to receive copy of patient instructions, educational materials, and care plan.   No further follow up required: No needs  Raina Mina, RN Care Management Coordinator [place] Office [phone]

## 2021-10-05 ENCOUNTER — Encounter: Payer: Self-pay | Admitting: Physical Therapy

## 2021-10-05 ENCOUNTER — Ambulatory Visit: Payer: Medicare HMO | Admitting: Physical Therapy

## 2021-10-05 DIAGNOSIS — M6281 Muscle weakness (generalized): Secondary | ICD-10-CM | POA: Diagnosis not present

## 2021-10-05 DIAGNOSIS — R29898 Other symptoms and signs involving the musculoskeletal system: Secondary | ICD-10-CM

## 2021-10-05 DIAGNOSIS — M25551 Pain in right hip: Secondary | ICD-10-CM | POA: Diagnosis not present

## 2021-10-05 DIAGNOSIS — R2689 Other abnormalities of gait and mobility: Secondary | ICD-10-CM | POA: Diagnosis not present

## 2021-10-05 NOTE — Therapy (Signed)
OUTPATIENT PHYSICAL THERAPY   Patient Name: Robert [name] MRN: [id number] DOB:May 06, 1960, 61 y.o., male Today's Date: 10/05/2021   PT End of Session - 10/05/21 1528     Visit Number 3    Number of Visits 16    Date for PT Re-Evaluation 11/23/21    PT Start Time 1528    PT Stop Time 1610    PT Time Calculation (min) 42 min    Activity Tolerance Patient tolerated treatment well    Behavior During Therapy [name] for tasks assessed/performed              Past Medical History:  Diagnosis Date   Alcohol abuse    ongoing as of 07/2017.  Quit after CVA 05/2018   Arthritis    Atrial fibrillation ([place])    Not candidate for anticoagulation b/c of GI bleeding; had to get transfused.  Then, pt had CVA 05/2018 and he was put on xarelto and he quit drinking.   Coronary atherosclerosis    Noted on lung cancer screening CT   COVID-19 virus infection 06/2019   CVA (cerebral vascular accident) ([place]) 05/2018   large R MCA territory infarct.  CTA neg for large cerebral vessel occlusion.  R ICA 70% stenosis, L clear. Rpt CTA neck ordered by neuro as of 01/2019.   Gastric ulcer    hx of--unclear (pt and wife deny this)   Gout    Usually MTP joint   History of adenomatous polyp of colon 2021   recall 2024   History of digital clubbing    "all my adult life"--CXR 08/2016 = bronchitic changes o/w normal.   Hyperkalemia 04/2017   Normalized with decreasing lisinopril from 40 mg qd to 20 mg qd.   Hyperlipidemia 07/2016   Holding off on statin until pt quits drinking.  Back on statin after CVA 05/2018   Hypertension    Nonischemic cardiomyopathy ([place]) 2015; 2020   2015: "resolved" (w/medical therapy?).  05/2018 EF 35-40%, with TR and MR and ++LAE. 05/2019 EF 50-52%, low normal LV wall motion, grd II DD, mod MR and mod TR, mild pulm HTN.   Nonrheumatic mitral valve regurgitation    Osteoarthritis of right hip    Surgery being planned as of 05/2021 consult with Dr. Alvan [name]   Paralysis [facility])    some left  side deficit from CVA in 2020   Pre-diabetes    Screening for lung cancer    04/2021 CT showed no lesions/nodules.  Repeat 1 year   Sleep apnea    Tobacco abuse    Quit 2014   Past Surgical History:  Procedure Laterality Date   CARDIAC CATHETERIZATION  2015   Normal coronaries   CARDIOVERSION     DCCV 2015   COLONOSCOPY  08/09/2019   One large adenoma-->recall 3 yrs   TONSILLECTOMY     TOTAL HIP ARTHROPLASTY Right 08/03/2021   Procedure: TOTAL HIP ARTHROPLASTY ANTERIOR APPROACH;  Surgeon: Robert Cancel, MD;  Location: [facility];  Service: Orthopedics;  Laterality: Right;   TRANSTHORACIC ECHOCARDIOGRAM  06/08/2018; 05/2019   05/2018 (in context of acute CVA): EF 35-40%, mild/mod MR, mod/sev TR, severely dilated LA.  05/2019 EF 50-52%, low normal LV wall motion, grd II DD, mod MR and mod TR, mild pulm HTN.   Patient Active Problem List   Diagnosis Date Noted   S/P total right hip arthroplasty 08/03/2021   Atrial fibrillation with RVR ([place]) 06/29/2019   General weakness 06/29/2019   Encounter for orogastric (OG) tube  placement    AKI (acute kidney injury) ([place])    Acute idiopathic gout of right ankle    Leukocytosis    Oropharyngeal dysphagia    Family history of lower GI bleeding    Altered mental status    Endotracheal tube present    History of CVA with residual deficit 06/06/2018   Alcohol abuse 06/06/2018   Hyperglycemia 08/02/2016   Type II diabetes mellitus ([place]) 07/10/2016   Hyperlipidemia 03/19/2015   Hip arthritis 02/12/2015   Acute posthemorrhagic anemia 02/24/2013   Anemia 02/24/2013   Hypertension 02/24/2013   A-fib ([place]) 02/06/2013   Tricuspid regurgitation 02/06/2013   Non-ischemic cardiomyopathy ([place]) 02/06/2013   Febrile 02/05/2013   Nondependent alcohol abuse, continuous drinking behavior 02/05/2013    PCP: Dr Robert [name]  REFERRING PROVIDER: Irving Copas, PA C Dr Robert [name]  REFERRING DIAG: Rt THA - anterior approach  THERAPY DIAG:  Pain in  right hip  Other symptoms and signs involving the musculoskeletal system  Other abnormalities of gait and mobility  Muscle weakness (generalized)  Rationale for Evaluation and Treatment Rehabilitation  ONSET DATE: 08/03/21  SUBJECTIVE:   SUBJECTIVE STATEMENT: Patient states he's been walking more. Pt reports he got radiation on the right hip to try and get rid of scar tissue.   PERTINENT HISTORY: CVA 05/2018; Arthritis; AODM; elevated cholesterol; A-fib; bilat hearing loss   PAIN:  Are you having pain? Yes: NPRS scale: 0/10 Pain location: hip  Pain description: none Aggravating factors: prolonged standing or walking  Relieving factors: rest   PRECAUTIONS: Anterior hip  WEIGHT BEARING RESTRICTIONS No   PATIENT GOALS wants to get out and walk and exercise - has a side walk that is level   OBJECTIVE:   DIAGNOSTIC FINDINGS: CT Rt hip 07/16/21 - 1. Advanced right hip osteoarthritis with bone-on-bone articulation,subchondral cystic changes and bulky osteophytes. Osseous remodeling of the acetabulum as well as heterotopic ossification. This may be sequela of developmental dysplasia of the hip or reactive arthritis secondary to trauma. 2.  Hamstring origin enthesopathy. 3.  Skin and subcutaneous soft tissues are unremarkable.  PATIENT SURVEYS:  FOTO 36  COGNITION:  Overall cognitive status: Within functional limits for tasks assessed     SENSATION: WFL   MUSCLE LENGTH: Hamstrings: Right 40 deg; Left 45 deg Thomas test: unable to preform due to stiffness and tightness in bilat hips Rt > Lt  POSTURE: forward head, decreased lumbar lordosis, increased thoracic kyphosis, weight shift left  PALPATION: Significant muscular tightness to palpation in the Rt anterior hip in area of incision as well as proximal hip and proximal thigh anteriorly and laterally.   LOWER EXTREMITY ROM: assessed with pt in supine and sitting   Active ROM Right eval Left eval  Hip flexion 80 100   Hip extension -20 -10  Hip abduction 15 23  Hip adduction neutral neutral  Hip internal rotation Tight  Tight   Hip external rotation Tight  Tight   Knee flexion 90 100  Knee extension -10 -10  Ankle dorsiflexion    Ankle plantarflexion    Ankle inversion    Ankle eversion     (Blank rows = not tested)  LOWER EXTREMITY MMT:  assessed with patient in supine and sitting  MMT Right eval Left eval  Hip flexion 3+/5 4+/5  Hip extension 3/5 3/5  Hip abduction 3/5 3/5  Hip adduction    Hip internal rotation    Hip external rotation    Knee flexion 4+/5 5/5  Knee extension 4+/5 5/5   Ankle dorsiflexion    Ankle plantarflexion    Ankle inversion    Ankle eversion     (Blank rows = not tested)   GAIT: Distance walked: 40 Assistive device utilized: Single point cane Level of assistance: Complete Independence Comments: abnormal gait pattern related to Rt THA and Lt hemiparesis    TODAY'S TREATMENT: 10/05/21 Nustep L5 x 5 min for warm up  Supine Hip flexor stretch 2x30 sec Butterfly stretch 2x30 sec Low trunk rotation x30 sec  Manual therapy Hip flexor and extensor hip mobilization grade II to III Contract/relax for TFL stretch and glute stretch STM & TPR R TFL/hip flexors Scar massage  Sitting Forward lean 2x10 with inhale/exhale L<>R lean 2x10 Ant/post pelvic tilt x10 Upright sitting balance x1 min  Self care Discussed sitting posture -- pt to practice sitting with feet flat   10/01/21 Nustep L5 x 5 min for warm up  Standing: Step up 6'' step forward and laterally x 10 each Hip abd x 20 yellow TB Hip ext x 20 yellow TB - max cuing for posture HS curl yellow TB x 20  Supine: Bridge 10 x 5 sec hold  Seated: Sit to stand multiple reps with facilitation for forward wt shift and alignment. Focus on forward flexion to improve hip ROM  Manual: Hip joint mobs to improve flexion grade 2-3 PROM Rt hip all directions to tolerance and tissue  limits  Eval: Therapeutic exercise: Quad set with towel behind knee 5 sec hold x 10 Hamstring stretch w/strap 30 sec x 2 Heel side with strap for hip/knee flexion x 5  Glut set supine 5 sec x 5 Bridge 5 sec hold x 5  Sit to stand x 5   Placed 3/8 in heel lift in Rt shoe to improve leg length therefore improving standing alignment and gait pattern   PATIENT EDUCATION:  Education details: POC; HEP Person educated: Patient Education method: Consulting civil engineer, Media planner, Corporate treasurer cues, Verbal cues, and Handouts Education comprehension: verbalized understanding, returned demonstration, verbal cues required, tactile cues required, and needs further education   HOME EXERCISE PROGRAM: Access Code: [email or web address] URL: [email or web address] Date: 09/28/2021 Prepared by: Gillermo Murdoch  Exercises - Supine Quad Set  - 2 x daily - 7 x weekly - 1 sets - 10 reps - 5 sec  hold - Hooklying Hamstring Stretch with Strap  - 2 x daily - 7 x weekly - 1 sets - 3 reps - 30 sec  hold - Hooklying Gluteal Sets  - 2 x daily - 7 x weekly - 1 sets - 10 reps - 5 sec  hold - Beginner Bridge  - 2 x daily - 7 x weekly - 1 sets - 10 reps - 5 sec  hold - Supine Heel Slide with Strap  - 2 x daily - 7 x weekly - 1 sets - 10 reps - 10 sec  hold - Sit to Stand with Armchair  - 2 x daily - 7 x weekly - 1 sets - 10 reps - 5 sec  hold  ASSESSMENT:  CLINICAL IMPRESSION: Session focused on trying to improve hip mobility and ROM. Provided hip mobilizations, stretches, and manual work. Increased education on improving sitting posture to allow stretch in hip joint/posterior capsule. Pt requires multiple cues for improved posture.    GOALS: Goals reviewed with patient? Yes   LONG TERM GOALS: Target date: 11/23/2021   Improve ROM/mobility/tissue extensibility Rt hip and LE to allow patient to achieve 95  to 100 deg hip flexion and 0 degrees hip extension  Baseline:  Goal status: INITIAL  2.  Improve strength Rt LE  to 4+/5 throughout  Baseline:  Goal status: INITIAL  3.  Improve gait pattern with patient to demonstrate reciprocal gait pattern with more upright posture and alignment  Baseline:  Goal status: INITIAL  4.  Independent in all transitional movements and transfers  Baseline:  Goal status: INITIAL  5.  Independent in HEP (including aquatic program as indicated) Baseline:  Goal status: INITIAL  6.  Improve functional limitation score to 61 Baseline:  Goal status: INITIAL   PLAN: PT FREQUENCY: 2x/week  PT DURATION: 8 weeks  PLANNED INTERVENTIONS: Therapeutic exercises, Therapeutic activity, Neuromuscular re-education, Balance training, Gait training, Patient/Family education, Self Care, DME instructions, Aquatic Therapy, Dry Needling, Electrical stimulation, Cryotherapy, Moist heat, Taping, Ultrasound, Manual therapy, and Re-evaluation  PLAN FOR NEXT SESSION: review and progress exercises; practice transfers and transitional movements; work on posture and alignment in standing; gait training; manual work as indicated to anterior lateral Rt hip musculature   Shritha Bresee April Gordy Levan, PT, DPT 10/05/2021, 3:29 PM

## 2021-10-05 NOTE — Telephone Encounter (Signed)
LVM for pt's wife regarding medication. He needs to schedule provider follow up as well.

## 2021-10-08 ENCOUNTER — Encounter: Payer: Self-pay | Admitting: Physical Therapy

## 2021-10-08 ENCOUNTER — Telehealth: Payer: Self-pay

## 2021-10-08 ENCOUNTER — Ambulatory Visit: Payer: Medicare HMO | Admitting: Physical Therapy

## 2021-10-08 DIAGNOSIS — M6281 Muscle weakness (generalized): Secondary | ICD-10-CM

## 2021-10-08 DIAGNOSIS — M25551 Pain in right hip: Secondary | ICD-10-CM

## 2021-10-08 DIAGNOSIS — R29898 Other symptoms and signs involving the musculoskeletal system: Secondary | ICD-10-CM | POA: Diagnosis not present

## 2021-10-08 DIAGNOSIS — R2689 Other abnormalities of gait and mobility: Secondary | ICD-10-CM

## 2021-10-08 MED ORDER — XARELTO 20 MG PO TABS: ORAL_TABLET | ORAL | 0 refills | Status: DC

## 2021-10-08 NOTE — Telephone Encounter (Signed)
Left detailed message advising 30 day supply sent until patient is seen for scheduled appointment, okay per DPR.

## 2021-10-08 NOTE — Therapy (Signed)
OUTPATIENT PHYSICAL THERAPY   Patient Name: Robert [name] MRN: [id number] DOB:June 28, 1960, 61 y.o., male Today's Date: 10/08/2021   PT End of Session - 10/08/21 1015     Visit Number 4    Number of Visits 16    Date for PT Re-Evaluation 11/23/21    PT Start Time 0930    PT Stop Time 1015    PT Time Calculation (min) 45 min    Activity Tolerance Patient tolerated treatment well    Behavior During Therapy [facility] for tasks assessed/performed               Past Medical History:  Diagnosis Date   Alcohol abuse    ongoing as of 07/2017.  Quit after CVA 05/2018   Arthritis    Atrial fibrillation ([place])    Not candidate for anticoagulation b/c of GI bleeding; had to get transfused.  Then, pt had CVA 05/2018 and he was put on xarelto and he quit drinking.   Coronary atherosclerosis    Noted on lung cancer screening CT   COVID-19 virus infection 06/2019   CVA (cerebral vascular accident) ([place]) 05/2018   large R MCA territory infarct.  CTA neg for large cerebral vessel occlusion.  R ICA 70% stenosis, L clear. Rpt CTA neck ordered by neuro as of 01/2019.   Gastric ulcer    hx of--unclear (pt and wife deny this)   Gout    Usually MTP joint   History of adenomatous polyp of colon 2021   recall 2024   History of digital clubbing    "all my adult life"--CXR 08/2016 = bronchitic changes o/w normal.   Hyperkalemia 04/2017   Normalized with decreasing lisinopril from 40 mg qd to 20 mg qd.   Hyperlipidemia 07/2016   Holding off on statin until pt quits drinking.  Back on statin after CVA 05/2018   Hypertension    Nonischemic cardiomyopathy ([place]) 2015; 2020   2015: "resolved" (w/medical therapy?).  05/2018 EF 35-40%, with TR and MR and ++LAE. 05/2019 EF 50-52%, low normal LV wall motion, grd II DD, mod MR and mod TR, mild pulm HTN.   Nonrheumatic mitral valve regurgitation    Osteoarthritis of right hip    Surgery being planned as of 05/2021 consult with Dr. Alvan Dame   Paralysis [facility])    some left  side deficit from CVA in 2020   Pre-diabetes    Screening for lung cancer    04/2021 CT showed no lesions/nodules.  Repeat 1 year   Sleep apnea    Tobacco abuse    Quit 2014   Past Surgical History:  Procedure Laterality Date   CARDIAC CATHETERIZATION  2015   Normal coronaries   CARDIOVERSION     DCCV 2015   COLONOSCOPY  08/09/2019   One large adenoma-->recall 3 yrs   TONSILLECTOMY     TOTAL HIP ARTHROPLASTY Right 08/03/2021   Procedure: TOTAL HIP ARTHROPLASTY ANTERIOR APPROACH;  Surgeon: Paralee Cancel, MD;  Location: [facility];  Service: Orthopedics;  Laterality: Right;   TRANSTHORACIC ECHOCARDIOGRAM  06/08/2018; 05/2019   05/2018 (in context of acute CVA): EF 35-40%, mild/mod MR, mod/sev TR, severely dilated LA.  05/2019 EF 50-52%, low normal LV wall motion, grd II DD, mod MR and mod TR, mild pulm HTN.   Patient Active Problem List   Diagnosis Date Noted   S/P total right hip arthroplasty 08/03/2021   Atrial fibrillation with RVR ([place]) 06/29/2019   General weakness 06/29/2019   Encounter for orogastric (OG)  tube placement    AKI (acute kidney injury) ([place])    Acute idiopathic gout of right ankle    Leukocytosis    Oropharyngeal dysphagia    Family history of lower GI bleeding    Altered mental status    Endotracheal tube present    History of CVA with residual deficit 06/06/2018   Alcohol abuse 06/06/2018   Hyperglycemia 08/02/2016   Type II diabetes mellitus ([place]) 07/10/2016   Hyperlipidemia 03/19/2015   Hip arthritis 02/12/2015   Acute posthemorrhagic anemia 02/24/2013   Anemia 02/24/2013   Hypertension 02/24/2013   A-fib ([place]) 02/06/2013   Tricuspid regurgitation 02/06/2013   Non-ischemic cardiomyopathy ([place]) 02/06/2013   Febrile 02/05/2013   Nondependent alcohol abuse, continuous drinking behavior 02/05/2013    PCP: Dr Shawnie Dapper  REFERRING PROVIDER: Irving Copas, PA C Dr Paralee Cancel  REFERRING DIAG: Rt THA - anterior approach  THERAPY DIAG:  Pain in  right hip  Other symptoms and signs involving the musculoskeletal system  Other abnormalities of gait and mobility  Muscle weakness (generalized)  Rationale for Evaluation and Treatment Rehabilitation  ONSET DATE: 08/03/21  SUBJECTIVE:   SUBJECTIVE STATEMENT: Patient states he feels "stiff" this morning. No c/o pain  PERTINENT HISTORY: CVA 05/2018; Arthritis; AODM; elevated cholesterol; A-fib; bilat hearing loss   PAIN:  Are you having pain? NO   PRECAUTIONS: Anterior hip  WEIGHT BEARING RESTRICTIONS No   PATIENT GOALS wants to get out and walk and exercise - has a side walk that is level   OBJECTIVE:    LOWER EXTREMITY ROM: assessed with pt in supine and sitting   Active ROM Right eval Left eval Right 9/29  Hip flexion 80 100 82  Hip extension -20 -10 -7  Hip abduction 15 23   Hip adduction neutral neutral   Hip internal rotation Tight  Tight    Hip external rotation Tight  Tight    Knee flexion 90 100   Knee extension -10 -10   Ankle dorsiflexion     Ankle plantarflexion     Ankle inversion     Ankle eversion      (Blank rows = not tested)  LOWER EXTREMITY MMT:  assessed with patient in supine and sitting  MMT Right eval Left eval  Hip flexion 3+/5 4+/5  Hip extension 3/5 3/5  Hip abduction 3/5 3/5  Hip adduction    Hip internal rotation    Hip external rotation    Knee flexion 4+/5 5/5  Knee extension 4+/5 5/5   Ankle dorsiflexion    Ankle plantarflexion    Ankle inversion    Ankle eversion     (Blank rows = not tested)   GAIT: Distance walked: 40 Assistive device utilized: Single point cane Level of assistance: Complete Independence Comments: abnormal gait pattern related to Rt THA and Lt hemiparesis    TODAY'S TREATMENT: 10/08/21 Nustep L5 x 5 min warm up  Standing: Step ups 6'' step x 10  Hip abd yellow TB x 15 Hip ext yellow TB x 15 HS curl yellow TB x 15  Sitting: Forward lean x 10 with inhale/exhale Ant/post pelvic  tilt x 10  Supine: Hip flexor stretch Butterfly stretch LTR  Manual: Long axis hip distraction, jt mobs grade 2-3 for hip flex/ext PROM into hip flexion and extension  10/05/21 Nustep L5 x 5 min for warm up  Supine Hip flexor stretch 2x30 sec Butterfly stretch 2x30 sec Low trunk rotation x30 sec  Manual therapy Hip  flexor and extensor hip mobilization grade II to III Contract/relax for TFL stretch and glute stretch STM & TPR R TFL/hip flexors Scar massage  Sitting Forward lean 2x10 with inhale/exhale L<>R lean 2x10 Ant/post pelvic tilt x10 Upright sitting balance x1 min  Self care Discussed sitting posture -- pt to practice sitting with feet flat   10/01/21 Nustep L5 x 5 min for warm up  Standing: Step up 6'' step forward and laterally x 10 each Hip abd x 20 yellow TB Hip ext x 20 yellow TB - max cuing for posture HS curl yellow TB x 20  Supine: Bridge 10 x 5 sec hold  Seated: Sit to stand multiple reps with facilitation for forward wt shift and alignment. Focus on forward flexion to improve hip ROM  Manual: Hip joint mobs to improve flexion grade 2-3 PROM Rt hip all directions to tolerance and tissue limits  Eval: Therapeutic exercise: Quad set with towel behind knee 5 sec hold x 10 Hamstring stretch w/strap 30 sec x 2 Heel side with strap for hip/knee flexion x 5  Glut set supine 5 sec x 5 Bridge 5 sec hold x 5  Sit to stand x 5   Placed 3/8 in heel lift in Rt shoe to improve leg length therefore improving standing alignment and gait pattern   PATIENT EDUCATION:  Education details: POC; HEP Person educated: Patient Education method: Consulting civil engineer, Media planner, Corporate treasurer cues, Verbal cues, and Handouts Education comprehension: verbalized understanding, returned demonstration, verbal cues required, tactile cues required, and needs further education   HOME EXERCISE PROGRAM: Access Code: [email or web address] URL: [email or web address] Date:  09/28/2021 Prepared by: Gillermo Murdoch  Exercises - Supine Quad Set  - 2 x daily - 7 x weekly - 1 sets - 10 reps - 5 sec  hold - Hooklying Hamstring Stretch with Strap  - 2 x daily - 7 x weekly - 1 sets - 3 reps - 30 sec  hold - Hooklying Gluteal Sets  - 2 x daily - 7 x weekly - 1 sets - 10 reps - 5 sec  hold - Beginner Bridge  - 2 x daily - 7 x weekly - 1 sets - 10 reps - 5 sec  hold - Supine Heel Slide with Strap  - 2 x daily - 7 x weekly - 1 sets - 10 reps - 10 sec  hold - Sit to Stand with Armchair  - 2 x daily - 7 x weekly - 1 sets - 10 reps - 5 sec  hold  ASSESSMENT:  CLINICAL IMPRESSION: Pt making improvements in hip extension, still very limited in hip flexion. Pt requires cues for posture and technique with standing exercises   GOALS: Goals reviewed with patient? Yes   LONG TERM GOALS: Target date: 11/23/2021   Improve ROM/mobility/tissue extensibility Rt hip and LE to allow patient to achieve 95 to 100 deg hip flexion and 0 degrees hip extension  Baseline:  Goal status: INITIAL  2.  Improve strength Rt LE to 4+/5 throughout  Baseline:  Goal status: INITIAL  3.  Improve gait pattern with patient to demonstrate reciprocal gait pattern with more upright posture and alignment  Baseline:  Goal status: INITIAL  4.  Independent in all transitional movements and transfers  Baseline:  Goal status: INITIAL  5.  Independent in HEP (including aquatic program as indicated) Baseline:  Goal status: INITIAL  6.  Improve functional limitation score to 61 Baseline:  Goal status: INITIAL   PLAN: PT FREQUENCY: 2x/week  PT DURATION: 8 weeks  PLANNED INTERVENTIONS: Therapeutic exercises, Therapeutic activity, Neuromuscular re-education, Balance training, Gait training, Patient/Family education, Self Care, DME instructions, Aquatic Therapy, Dry Needling, Electrical stimulation, Cryotherapy, Moist heat, Taping, Ultrasound, Manual therapy, and Re-evaluation  PLAN FOR NEXT SESSION:  review and progress exercises; practice transfers and transitional movements; work on posture and alignment in standing; gait training; manual work as indicated to anterior lateral Rt hip musculature   Cambria Osten, PT, DPT 10/08/2021, 10:16 AM

## 2021-10-08 NOTE — Telephone Encounter (Signed)
Patient wife Robert [name] [facility]) calling refill request for patient. Robert [name] [facility]  Patient is scheduled to see Dr. Anitra Lauth on 10/9.  XARELTO 20 MG TABS tablet [[id number]]

## 2021-10-12 ENCOUNTER — Ambulatory Visit: Payer: Medicare HMO | Attending: Orthopedic Surgery | Admitting: Physical Therapy

## 2021-10-12 ENCOUNTER — Encounter: Payer: Self-pay | Admitting: Physical Therapy

## 2021-10-12 DIAGNOSIS — R2689 Other abnormalities of gait and mobility: Secondary | ICD-10-CM | POA: Diagnosis not present

## 2021-10-12 DIAGNOSIS — R29898 Other symptoms and signs involving the musculoskeletal system: Secondary | ICD-10-CM | POA: Insufficient documentation

## 2021-10-12 DIAGNOSIS — M25551 Pain in right hip: Secondary | ICD-10-CM | POA: Insufficient documentation

## 2021-10-12 DIAGNOSIS — M6281 Muscle weakness (generalized): Secondary | ICD-10-CM | POA: Insufficient documentation

## 2021-10-12 NOTE — Therapy (Signed)
OUTPATIENT PHYSICAL THERAPY   Patient Name: Robert [name] MRN: [id number] DOB:[birth year], 61 y.o., male Today's Date: 10/12/2021   PT End of Session - 10/12/21 1656     Visit Number 5    Number of Visits 16    Date for PT Re-Evaluation 11/23/21    PT Start Time 1610    PT Stop Time 1657    PT Time Calculation (min) 47 min    Activity Tolerance Patient tolerated treatment well    Behavior During Therapy [facility] for tasks assessed/performed                Past Medical History:  Diagnosis Date   Alcohol abuse    ongoing as of 07/2017.  Quit after CVA 05/2018   Arthritis    Atrial fibrillation ([place])    Not candidate for anticoagulation b/c of GI bleeding; had to get transfused.  Then, pt had CVA 05/2018 and he was put on xarelto and he quit drinking.   Coronary atherosclerosis    Noted on lung cancer screening CT   COVID-19 virus infection 06/2019   CVA (cerebral vascular accident) ([place]) 05/2018   large R MCA territory infarct.  CTA neg for large cerebral vessel occlusion.  R ICA 70% stenosis, L clear. Rpt CTA neck ordered by neuro as of 01/2019.   Gastric ulcer    hx of--unclear (pt and wife deny this)   Gout    Usually MTP joint   History of adenomatous polyp of colon 2021   recall 2024   History of digital clubbing    "all my adult life"--CXR 08/2016 = bronchitic changes o/w normal.   Hyperkalemia 04/2017   Normalized with decreasing lisinopril from 40 mg qd to 20 mg qd.   Hyperlipidemia 07/2016   Holding off on statin until pt quits drinking.  Back on statin after CVA 05/2018   Hypertension    Nonischemic cardiomyopathy ([place]) 2015; 2020   2015: "resolved" (w/medical therapy?).  05/2018 EF 35-40%, with TR and MR and ++LAE. 05/2019 EF 50-52%, low normal LV wall motion, grd II DD, mod MR and mod TR, mild pulm HTN.   Nonrheumatic mitral valve regurgitation    Osteoarthritis of right hip    Surgery being planned as of 05/2021 consult with Dr. Alvan Dame   Paralysis [facility])    some  left side deficit from CVA in 2020   Pre-diabetes    Screening for lung cancer    04/2021 CT showed no lesions/nodules.  Repeat 1 year   Sleep apnea    Tobacco abuse    Quit 2014   Past Surgical History:  Procedure Laterality Date   CARDIAC CATHETERIZATION  2015   Normal coronaries   CARDIOVERSION     DCCV 2015   COLONOSCOPY  08/09/2019   One large adenoma-->recall 3 yrs   TONSILLECTOMY     TOTAL HIP ARTHROPLASTY Right 08/03/2021   Procedure: TOTAL HIP ARTHROPLASTY ANTERIOR APPROACH;  Surgeon: Paralee Cancel, MD;  Location: [facility];  Service: Orthopedics;  Laterality: Right;   TRANSTHORACIC ECHOCARDIOGRAM  06/08/2018; 05/2019   05/2018 (in context of acute CVA): EF 35-40%, mild/mod MR, mod/sev TR, severely dilated LA.  05/2019 EF 50-52%, low normal LV wall motion, grd II DD, mod MR and mod TR, mild pulm HTN.   Patient Active Problem List   Diagnosis Date Noted   S/P total right hip arthroplasty 08/03/2021   Atrial fibrillation with RVR ([place]) 06/29/2019   General weakness 06/29/2019   Encounter for orogastric (  OG) tube placement    AKI (acute kidney injury) ([place])    Acute idiopathic gout of right ankle    Leukocytosis    Oropharyngeal dysphagia    Family history of lower GI bleeding    Altered mental status    Endotracheal tube present    History of CVA with residual deficit 06/06/2018   Alcohol abuse 06/06/2018   Hyperglycemia 08/02/2016   Type II diabetes mellitus ([place]) 07/10/2016   Hyperlipidemia 03/19/2015   Hip arthritis 02/12/2015   Acute posthemorrhagic anemia 02/24/2013   Anemia 02/24/2013   Hypertension 02/24/2013   A-fib ([place]) 02/06/2013   Tricuspid regurgitation 02/06/2013   Non-ischemic cardiomyopathy ([place]) 02/06/2013   Febrile 02/05/2013   Nondependent alcohol abuse, continuous drinking behavior 02/05/2013    PCP: Dr Shawnie Dapper  REFERRING PROVIDER: Irving Copas, PA C Dr Paralee Cancel  REFERRING DIAG: Rt THA - anterior approach  THERAPY DIAG:   Pain in right hip  Other symptoms and signs involving the musculoskeletal system  Other abnormalities of gait and mobility  Muscle weakness (generalized)  Rationale for Evaluation and Treatment Rehabilitation  ONSET DATE: 08/03/21  SUBJECTIVE:   SUBJECTIVE STATEMENT: Patient states he feels stiff right now but usually feels ok when he gets up in the morning  PERTINENT HISTORY: CVA 05/2018; Arthritis; AODM; elevated cholesterol; A-fib; bilat hearing loss   PAIN:  Are you having pain? NO   PRECAUTIONS: Anterior hip  WEIGHT BEARING RESTRICTIONS No   PATIENT GOALS wants to get out and walk and exercise - has a side walk that is level   OBJECTIVE:    LOWER EXTREMITY ROM: assessed with pt in supine and sitting   Active ROM Right eval Left eval Right 9/29  Hip flexion 80 100 82  Hip extension -20 -10 -7  Hip abduction 15 23   Hip adduction neutral neutral   Hip internal rotation Tight  Tight    Hip external rotation Tight  Tight    Knee flexion 90 100   Knee extension -10 -10   Ankle dorsiflexion     Ankle plantarflexion     Ankle inversion     Ankle eversion      (Blank rows = not tested)  LOWER EXTREMITY MMT:  assessed with patient in supine and sitting  MMT Right eval Left eval  Hip flexion 3+/5 4+/5  Hip extension 3/5 3/5  Hip abduction 3/5 3/5  Hip adduction    Hip internal rotation    Hip external rotation    Knee flexion 4+/5 5/5  Knee extension 4+/5 5/5   Ankle dorsiflexion    Ankle plantarflexion    Ankle inversion    Ankle eversion     (Blank rows = not tested)   TODAY'S TREATMENT: 10/12/21 Nustep L5 x 5 min  Tap up 8'' step x 20 Side step 8' x10 Backward walk 8' x 10  Forward lean with inhale/exhale Sit <> stand 2 x 3 with focus on forward wt shift  LTR Hip flexor stretch Heel slide with LEs on physioball  Manual: Long axis hip distraction, jt mobs grade 2-3 for hip flex/ext PROM into hip flexion and  extension  10/08/21 Nustep L5 x 5 min warm up  Standing: Step ups 6'' step x 10  Hip abd yellow TB x 15 Hip ext yellow TB x 15 HS curl yellow TB x 15  Sitting: Forward lean x 10 with inhale/exhale Ant/post pelvic tilt x 10  Supine: Hip flexor stretch Butterfly stretch  LTR  Manual: Long axis hip distraction, jt mobs grade 2-3 for hip flex/ext PROM into hip flexion and extension  10/05/21 Nustep L5 x 5 min for warm up  Supine Hip flexor stretch 2x30 sec Butterfly stretch 2x30 sec Low trunk rotation x30 sec  Manual therapy Hip flexor and extensor hip mobilization grade II to III Contract/relax for TFL stretch and glute stretch STM & TPR R TFL/hip flexors Scar massage  Sitting Forward lean 2x10 with inhale/exhale L<>R lean 2x10 Ant/post pelvic tilt x10 Upright sitting balance x1 min  Self care Discussed sitting posture -- pt to practice sitting with feet flat   PATIENT EDUCATION:  Education details: POC; HEP Person educated: Patient Education method: Consulting civil engineer, Demonstration, Tactile cues, Verbal cues, and Handouts Education comprehension: verbalized understanding, returned demonstration, verbal cues required, tactile cues required, and needs further education   HOME EXERCISE PROGRAM: Access Code: [email or web address] URL: [email or web address] Date: 09/28/2021 Prepared by: Gillermo Murdoch  Exercises - Supine Quad Set  - 2 x daily - 7 x weekly - 1 sets - 10 reps - 5 sec  hold - Hooklying Hamstring Stretch with Strap  - 2 x daily - 7 x weekly - 1 sets - 3 reps - 30 sec  hold - Hooklying Gluteal Sets  - 2 x daily - 7 x weekly - 1 sets - 10 reps - 5 sec  hold - Beginner Bridge  - 2 x daily - 7 x weekly - 1 sets - 10 reps - 5 sec  hold - Supine Heel Slide with Strap  - 2 x daily - 7 x weekly - 1 sets - 10 reps - 10 sec  hold - Sit to Stand with Armchair  - 2 x daily - 7 x weekly - 1 sets - 10 reps - 5 sec  hold  ASSESSMENT:  CLINICAL IMPRESSION: Continued  pt education on importance of forward lean/wt shift for sit to stand to improve hip flexion. Continued to emphasize importance of stretching and strengthening at home   GOALS: Goals reviewed with patient? Yes   LONG TERM GOALS: Target date: 11/23/2021   Improve ROM/mobility/tissue extensibility Rt hip and LE to allow patient to achieve 95 to 100 deg hip flexion and 0 degrees hip extension  Baseline:  Goal status: INITIAL  2.  Improve strength Rt LE to 4+/5 throughout  Baseline:  Goal status: INITIAL  3.  Improve gait pattern with patient to demonstrate reciprocal gait pattern with more upright posture and alignment  Baseline:  Goal status: INITIAL  4.  Independent in all transitional movements and transfers  Baseline:  Goal status: INITIAL  5.  Independent in HEP (including aquatic program as indicated) Baseline:  Goal status: INITIAL  6.  Improve functional limitation score to 61 Baseline:  Goal status: INITIAL   PLAN: PT FREQUENCY: 2x/week  PT DURATION: 8 weeks  PLANNED INTERVENTIONS: Therapeutic exercises, Therapeutic activity, Neuromuscular re-education, Balance training, Gait training, Patient/Family education, Self Care, DME instructions, Aquatic Therapy, Dry Needling, Electrical stimulation, Cryotherapy, Moist heat, Taping, Ultrasound, Manual therapy, and Re-evaluation  PLAN FOR NEXT SESSION: review and progress exercises; practice transfers and transitional movements; work on posture and alignment in standing; gait training; manual work as indicated to anterior lateral Rt hip musculature   Gary Gabrielsen, PT 10/12/2021, 4:57 PM

## 2021-10-15 ENCOUNTER — Encounter: Payer: Self-pay | Admitting: Physical Therapy

## 2021-10-15 ENCOUNTER — Ambulatory Visit: Payer: Medicare HMO | Admitting: Physical Therapy

## 2021-10-15 DIAGNOSIS — M25551 Pain in right hip: Secondary | ICD-10-CM

## 2021-10-15 DIAGNOSIS — R2689 Other abnormalities of gait and mobility: Secondary | ICD-10-CM | POA: Diagnosis not present

## 2021-10-15 DIAGNOSIS — M6281 Muscle weakness (generalized): Secondary | ICD-10-CM

## 2021-10-15 DIAGNOSIS — R29898 Other symptoms and signs involving the musculoskeletal system: Secondary | ICD-10-CM | POA: Diagnosis not present

## 2021-10-15 NOTE — Therapy (Signed)
OUTPATIENT PHYSICAL THERAPY   Patient Name: Robert [name] MRN: [id number] DOB:1960-09-22, 61 y.o., male Today's Date: 10/15/2021   PT End of Session - 10/15/21 1007     Visit Number 6    Number of Visits 16    Date for PT Re-Evaluation 11/23/21    PT Start Time 0925    PT Stop Time 1007    PT Time Calculation (min) 42 min    Activity Tolerance Patient tolerated treatment well    Behavior During Therapy [facility] for tasks assessed/performed                 Past Medical History:  Diagnosis Date   Alcohol abuse    ongoing as of 07/2017.  Quit after CVA 05/2018   Arthritis    Atrial fibrillation ([place])    Not candidate for anticoagulation b/c of GI bleeding; had to get transfused.  Then, pt had CVA 05/2018 and he was put on xarelto and he quit drinking.   Coronary atherosclerosis    Noted on lung cancer screening CT   COVID-19 virus infection 06/2019   CVA (cerebral vascular accident) ([place]) 05/2018   large R MCA territory infarct.  CTA neg for large cerebral vessel occlusion.  R ICA 70% stenosis, L clear. Rpt CTA neck ordered by neuro as of 01/2019.   Gastric ulcer    hx of--unclear (pt and wife deny this)   Gout    Usually MTP joint   History of adenomatous polyp of colon 2021   recall 2024   History of digital clubbing    "all my adult life"--CXR 08/2016 = bronchitic changes o/w normal.   Hyperkalemia 04/2017   Normalized with decreasing lisinopril from 40 mg qd to 20 mg qd.   Hyperlipidemia 07/2016   Holding off on statin until pt quits drinking.  Back on statin after CVA 05/2018   Hypertension    Nonischemic cardiomyopathy ([place]) 2015; 2020   2015: "resolved" (w/medical therapy?).  05/2018 EF 35-40%, with TR and MR and ++LAE. 05/2019 EF 50-52%, low normal LV wall motion, grd II DD, mod MR and mod TR, mild pulm HTN.   Nonrheumatic mitral valve regurgitation    Osteoarthritis of right hip    Surgery being planned as of 05/2021 consult with Dr. Alvan Dame   Paralysis [facility])    some  left side deficit from CVA in 2020   Pre-diabetes    Screening for lung cancer    04/2021 CT showed no lesions/nodules.  Repeat 1 year   Sleep apnea    Tobacco abuse    Quit 2014   Past Surgical History:  Procedure Laterality Date   CARDIAC CATHETERIZATION  2015   Normal coronaries   CARDIOVERSION     DCCV 2015   COLONOSCOPY  08/09/2019   One large adenoma-->recall 3 yrs   TONSILLECTOMY     TOTAL HIP ARTHROPLASTY Right 08/03/2021   Procedure: TOTAL HIP ARTHROPLASTY ANTERIOR APPROACH;  Surgeon: Paralee Cancel, MD;  Location: [facility];  Service: Orthopedics;  Laterality: Right;   TRANSTHORACIC ECHOCARDIOGRAM  06/08/2018; 05/2019   05/2018 (in context of acute CVA): EF 35-40%, mild/mod MR, mod/sev TR, severely dilated LA.  05/2019 EF 50-52%, low normal LV wall motion, grd II DD, mod MR and mod TR, mild pulm HTN.   Patient Active Problem List   Diagnosis Date Noted   S/P total right hip arthroplasty 08/03/2021   Atrial fibrillation with RVR ([place]) 06/29/2019   General weakness 06/29/2019   Encounter for  orogastric (OG) tube placement    AKI (acute kidney injury) ([place])    Acute idiopathic gout of right ankle    Leukocytosis    Oropharyngeal dysphagia    Family history of lower GI bleeding    Altered mental status    Endotracheal tube present    History of CVA with residual deficit 06/06/2018   Alcohol abuse 06/06/2018   Hyperglycemia 08/02/2016   Type II diabetes mellitus ([place]) 07/10/2016   Hyperlipidemia 03/19/2015   Hip arthritis 02/12/2015   Acute posthemorrhagic anemia 02/24/2013   Anemia 02/24/2013   Hypertension 02/24/2013   A-fib ([place]) 02/06/2013   Tricuspid regurgitation 02/06/2013   Non-ischemic cardiomyopathy ([place]) 02/06/2013   Febrile 02/05/2013   Nondependent alcohol abuse, continuous drinking behavior 02/05/2013    PCP: Dr Shawnie Dapper  REFERRING PROVIDER: Irving Copas, PA C Dr Paralee Cancel  REFERRING DIAG: Rt THA - anterior approach  THERAPY DIAG:   Pain in right hip  Other symptoms and signs involving the musculoskeletal system  Other abnormalities of gait and mobility  Muscle weakness (generalized)  Rationale for Evaluation and Treatment Rehabilitation  ONSET DATE: 08/03/21  SUBJECTIVE:   SUBJECTIVE STATEMENT: Patient states he has been stretching at home. He states he was able to walk without his cane at home this morning  PERTINENT HISTORY: CVA 05/2018; Arthritis; AODM; elevated cholesterol; A-fib; bilat hearing loss   PAIN:  Are you having pain? NO   PRECAUTIONS: Anterior hip  WEIGHT BEARING RESTRICTIONS No   PATIENT GOALS wants to get out and walk and exercise - has a side walk that is level   OBJECTIVE:    LOWER EXTREMITY ROM: assessed with pt in supine and sitting   Active ROM Right eval Left eval Right 9/29  Hip flexion 80 100 82  Hip extension -20 -10 -7  Hip abduction 15 23   Hip adduction neutral neutral   Hip internal rotation Tight  Tight    Hip external rotation Tight  Tight    Knee flexion 90 100   Knee extension -10 -10   Ankle dorsiflexion     Ankle plantarflexion     Ankle inversion     Ankle eversion      (Blank rows = not tested)  LOWER EXTREMITY MMT:  assessed with patient in supine and sitting  MMT Right eval Left eval  Hip flexion 3+/5 4+/5  Hip extension 3/5 3/5  Hip abduction 3/5 3/5  Hip adduction    Hip internal rotation    Hip external rotation    Knee flexion 4+/5 5/5  Knee extension 4+/5 5/5   Ankle dorsiflexion    Ankle plantarflexion    Ankle inversion    Ankle eversion     (Blank rows = not tested)  Gait: (10/6) gait without AD 69' with step through pattern, close supervision, decreased knee and hip flexion on Rt  TODAY'S TREATMENT: 10/15/21 Nustep L5 x 5 min  Gait training without AD with cues for hip/knee flexion on Rt 75', 50'  Seated: Forward trunk flexion x 10 with inhale/exhale    Sit <> stand x 5 with focus on forward wt  shift    Standing   Side lunge to Rt 10 x 5 sec hold   Tap ups 10'' step 2 x 10 focus on hip and knee flexion ROM   Hip extension 2 x 10 cues to reduce forward lean  Supine: Heel slide with LEs on phisoball x 10 with 5 sec hold and  overpressure LTR x 10 with overpressure Butterfly stretch 3 x 30 sec with overpressure  Manual: PROM hip flexion and extension Jt mobs for hip flex and ext grade 2-3  10/12/21 Nustep L5 x 5 min  Tap up 8'' step x 20 Side step 8' x10 Backward walk 8' x 10  Forward lean with inhale/exhale Sit <> stand 2 x 3 with focus on forward wt shift  LTR Hip flexor stretch Heel slide with LEs on physioball  Manual: Long axis hip distraction, jt mobs grade 2-3 for hip flex/ext PROM into hip flexion and extension  10/08/21 Nustep L5 x 5 min warm up  Standing: Step ups 6'' step x 10  Hip abd yellow TB x 15 Hip ext yellow TB x 15 HS curl yellow TB x 15  Sitting: Forward lean x 10 with inhale/exhale Ant/post pelvic tilt x 10  Supine: Hip flexor stretch Butterfly stretch LTR  Manual: Long axis hip distraction, jt mobs grade 2-3 for hip flex/ext PROM into hip flexion and extension  PATIENT EDUCATION:  Education details: POC; HEP Person educated: Patient Education method: Consulting civil engineer, Demonstration, Corporate treasurer cues, Verbal cues, and Handouts Education comprehension: verbalized understanding, returned demonstration, verbal cues required, tactile cues required, and needs further education   HOME EXERCISE PROGRAM: Access Code: [email or web address] URL: [email or web address] Date: 09/28/2021 Prepared by: Gillermo Murdoch  Exercises - Supine Quad Set  - 2 x daily - 7 x weekly - 1 sets - 10 reps - 5 sec  hold - Hooklying Hamstring Stretch with Strap  - 2 x daily - 7 x weekly - 1 sets - 3 reps - 30 sec  hold - Hooklying Gluteal Sets  - 2 x daily - 7 x weekly - 1 sets - 10 reps - 5 sec  hold - Beginner Bridge  - 2 x daily - 7 x weekly - 1 sets - 10 reps -  5 sec  hold - Supine Heel Slide with Strap  - 2 x daily - 7 x weekly - 1 sets - 10 reps - 10 sec  hold - Sit to Stand with Armchair  - 2 x daily - 7 x weekly - 1 sets - 10 reps - 5 sec  hold  ASSESSMENT:  CLINICAL IMPRESSION: Pt with improvements in form with self stretching. Continues with resistance to PROM into hip flexion. Improved gait without AD   GOALS: Goals reviewed with patient? Yes   LONG TERM GOALS: Target date: 11/23/2021   Improve ROM/mobility/tissue extensibility Rt hip and LE to allow patient to achieve 95 to 100 deg hip flexion and 0 degrees hip extension  Baseline:  Goal status: INITIAL  2.  Improve strength Rt LE to 4+/5 throughout  Baseline:  Goal status: INITIAL  3.  Improve gait pattern with patient to demonstrate reciprocal gait pattern with more upright posture and alignment  Baseline:  Goal status: INITIAL  4.  Independent in all transitional movements and transfers  Baseline:  Goal status: INITIAL  5.  Independent in HEP (including aquatic program as indicated) Baseline:  Goal status: INITIAL  6.  Improve functional limitation score to 61 Baseline:  Goal status: INITIAL   PLAN: PT FREQUENCY: 2x/week  PT DURATION: 8 weeks  PLANNED INTERVENTIONS: Therapeutic exercises, Therapeutic activity, Neuromuscular re-education, Balance training, Gait training, Patient/Family education, Self Care, DME instructions, Aquatic Therapy, Dry Needling, Electrical stimulation, Cryotherapy, Moist heat, Taping, Ultrasound, Manual therapy, and Re-evaluation  PLAN FOR NEXT SESSION: review and progress exercises; practice transfers and transitional movements; work  on posture and alignment in standing; gait training; manual work as indicated to anterior lateral Rt hip musculature   Nyisha Clippard, PT 10/15/2021, 10:08 AM

## 2021-10-18 ENCOUNTER — Ambulatory Visit (INDEPENDENT_AMBULATORY_CARE_PROVIDER_SITE_OTHER): Payer: Medicare HMO | Admitting: Family Medicine

## 2021-10-18 ENCOUNTER — Encounter: Payer: Self-pay | Admitting: Family Medicine

## 2021-10-18 VITALS — BP 122/79 | HR 73 | Temp 97.5°F | Ht 68.9 in | Wt 184.4 lb

## 2021-10-18 DIAGNOSIS — E119 Type 2 diabetes mellitus without complications: Secondary | ICD-10-CM

## 2021-10-18 DIAGNOSIS — Z125 Encounter for screening for malignant neoplasm of prostate: Secondary | ICD-10-CM

## 2021-10-18 DIAGNOSIS — Z0000 Encounter for general adult medical examination without abnormal findings: Secondary | ICD-10-CM

## 2021-10-18 DIAGNOSIS — Z7901 Long term (current) use of anticoagulants: Secondary | ICD-10-CM | POA: Diagnosis not present

## 2021-10-18 DIAGNOSIS — Z79899 Other long term (current) drug therapy: Secondary | ICD-10-CM

## 2021-10-18 DIAGNOSIS — I4891 Unspecified atrial fibrillation: Secondary | ICD-10-CM

## 2021-10-18 DIAGNOSIS — E7800 Pure hypercholesterolemia, unspecified: Secondary | ICD-10-CM

## 2021-10-18 DIAGNOSIS — Z23 Encounter for immunization: Secondary | ICD-10-CM | POA: Diagnosis not present

## 2021-10-18 DIAGNOSIS — I10 Essential (primary) hypertension: Secondary | ICD-10-CM

## 2021-10-18 LAB — POCT GLYCOSYLATED HEMOGLOBIN (HGB A1C)
HbA1c POC (<> result, manual entry): 5.6 % (ref 4.0–5.6)
HbA1c, POC (controlled diabetic range): 5.6 % (ref 0.0–7.0)
HbA1c, POC (prediabetic range): 5.6 % — AB (ref 5.7–6.4)
Hemoglobin A1C: 5.6 % (ref 4.0–5.6)

## 2021-10-18 LAB — PSA, MEDICARE: PSA: 2.99 ng/ml (ref 0.10–4.00)

## 2021-10-18 MED ORDER — ATORVASTATIN CALCIUM 40 MG PO TABS
40.0000 mg | ORAL_TABLET | Freq: Every day | ORAL | 1 refills | Status: DC
Start: 2021-10-18 — End: 2022-04-18

## 2021-10-18 MED ORDER — FOLIC ACID 1 MG PO TABS
1.0000 mg | ORAL_TABLET | Freq: Every day | ORAL | 1 refills | Status: DC
Start: 2021-10-18 — End: 2022-04-18

## 2021-10-18 MED ORDER — LISINOPRIL 20 MG PO TABS: 20.0000 mg | ORAL_TABLET | Freq: Every day | ORAL | 1 refills | Status: DC

## 2021-10-18 MED ORDER — PANTOPRAZOLE SODIUM 40 MG PO TBEC: 40.0000 mg | DELAYED_RELEASE_TABLET | Freq: Two times a day (BID) | ORAL | 1 refills | Status: DC

## 2021-10-18 MED ORDER — XARELTO 20 MG PO TABS
ORAL_TABLET | ORAL | 1 refills | Status: DC
Start: 2021-10-18 — End: 2022-02-14

## 2021-10-18 MED ORDER — DIGOXIN 125 MCG PO TABS: 0.1250 mg | ORAL_TABLET | ORAL | 1 refills | Status: DC

## 2021-10-18 MED ORDER — ALLOPURINOL 100 MG PO TABS: 200.0000 mg | ORAL_TABLET | Freq: Every day | ORAL | 1 refills | Status: DC

## 2021-10-18 NOTE — Progress Notes (Signed)
Office Note 10/18/2021  CC:  Chief Complaint  Patient presents with   Diabetes   Hypertension   Hyperlipidemia   HPI:  Patient is a 61 y.o. male who is here for annual health maintenance exam and 35-monthfollow-up diabetes, hypertension, and hyperlipidemia.. A/P as of last visit: "#1 type 2 diabetes.  No meds. Last A1c excellent--5.8%.  Diet is not excellent. Hemoglobin A1c today.   #2 hypertension.  Well-controlled on lisinopril 20 mg a day and Toprol-XL 25 mg a day. Electrolytes and creatinine checked today.   #3 hyperlipidemia.  Continue atorvastatin 40 mg a day. Last LDL was 55 about 5 months ago. Plan repeat lipids and hepatic panel in 6 months.   #4 osteoarthritis right hip.  He was told he needed hip replacement in the remote past.  He does have bone-on-bone DJD.  His most recent x-ray in our system was 06/06/2018.  He has not been able to pursue surgery due to his other chronic medical problems plus his fear of being put to sleep. He is now ready to readdress the possibility of surgery and has asked for referral to Dr. OTama Hightoday.   #5 hearing loss, chronic. He requests referral to audiology clinic so I ordered this today."  INTERIM HX: RBlairsays he is doing fine.  On 08/03/2021 patient underwent right total hip arthroplasty by Dr. OAlvan Dame  He got radiation treatment to prevent heterotopic ossification. States [place]  Says he uses a cane to assist with ambulation, no use of walker anymore. He states he only took oxycodone and Robaxin for a few days after his surgery.  No home glucose monitoring. No home blood pressure monitoring.   Past Medical History:  Diagnosis Date   Alcohol abuse    ongoing as of 07/2017.  Quit after CVA 05/2018   Arthritis    Atrial fibrillation ([place]    Not candidate for anticoagulation b/c of GI bleeding; had to get transfused.  Then, pt had CVA 05/2018 and he was put on xarelto and he quit drinking.    Coronary atherosclerosis    Noted on lung cancer screening CT   COVID-19 virus infection 06/2019   CVA (cerebral vascular accident) [place] 05[place]   large R MCA territory infarct.  CTA neg for large cerebral vessel occlusion.  R ICA 70% stenosis, L clear. Rpt CTA neck ordered by neuro as of 01/2019.   Gastric ulcer    hx of--unclear (pt and wife deny this)   Gout    Usually MTP joint   History of adenomatous polyp of colon 2021   recall 2024   History of digital clubbing    "all my adult life"--CXR 08/2016 = bronchitic changes o/w normal.   Hyperkalemia 04/2017   Normalized with decreasing lisinopril from 40 mg qd to 20 mg qd.   Hyperlipidemia 07/2016   Holding off on statin until pt quits drinking.  Back on statin after CVA 05/2018   Hypertension    Nonischemic cardiomyopathy ([facility] 2015; 2020   2015: "resolved" (w/medical therapy?).  05/2018 EF 35-40%, with TR and MR and ++LAE. 05/2019 EF 50-52%, low normal LV wall motion, grd II DD, mod MR and mod TR, mild pulm HTN.   Nonrheumatic mitral valve regurgitation    Osteoarthritis of right hip    Surgery being planned as of 05/2021 consult with Dr. OAlvan Dame  [facility] ([facility]    some left side deficit from CVA in 2020   Pre-diabetes  Screening for lung cancer    04/2021 CT showed no lesions/nodules.  Repeat 1 year   Sleep apnea    Tobacco abuse    Quit 2014    Past Surgical History:  Procedure Laterality Date   CARDIAC CATHETERIZATION  2015   Normal coronaries   CARDIOVERSION     DCCV 2015   COLONOSCOPY  08/09/2019   One large adenoma-->recall 3 yrs   TONSILLECTOMY     TOTAL HIP ARTHROPLASTY Right 08/03/2021   Procedure: TOTAL HIP ARTHROPLASTY ANTERIOR APPROACH;  Surgeon: Paralee Cancel, MD;  Location: [facility];  Service: Orthopedics;  Laterality: Right;   TRANSTHORACIC ECHOCARDIOGRAM  06/08/2018; 05/2019   05/2018 (in context of acute CVA): EF 35-40%, mild/mod MR, mod/sev TR, severely dilated LA.  05/2019 EF 50-52%, low normal LV wall  motion, grd II DD, mod MR and mod TR, mild pulm HTN.   TRANSTHORACIC ECHOCARDIOGRAM     07/06/21 EF 50-55%,normal WM, mild elev RVP, mild MR.    Family History  Problem Relation Age of Onset   Diabetes Mother    Congestive Heart Failure Mother    Alcohol abuse Father    Hypertension Father    Stroke Father    Heart disease Paternal Grandfather     Social History   Socioeconomic History   Marital status: Married    Spouse name: Not on file   Number of children: Not on file   Years of education: Not on file   Highest education level: Not on file  Occupational History   Not on file  Tobacco Use   Smoking status: Former    Packs/day: 1.00    Years: 5.00    Total pack years: 5.00    Types: Cigarettes    Quit date: 01/10/2013    Years since quitting: 8.7   Smokeless tobacco: Never  Vaping Use   Vaping Use: Former  Substance and Sexual Activity   Alcohol use: Not Currently    Comment: drinks liquor daily   Drug use: No   Sexual activity: Not on file  Other Topics Concern   Not on file  Social History Narrative   Married, 2 adult children.   Educ: 11th grade   Occup: "out of work".  Worked for [employer]--was let go for not abiding by [employer] per pt/wife.   Tob: former--quit 2014.  "Of and on prior".   Alc: long hx of drinking a pint and 1 and 1/2 pint of whiskey per day x years---quit approx May 2020.Marland Kitchen   Drugs: none.   Social Determinants of Health   Financial Resource Strain: Low Risk  (04/10/2021)   Overall Financial Resource Strain (CARDIA)    Difficulty of Paying Living Expenses: Not hard at all  Food Insecurity: No Food Insecurity (10/01/2021)   Hunger Vital Sign    Worried About Running Out of Food in the Last Year: Never true    Ran Out of Food in the Last Year: Never true  Transportation Needs: No Transportation Needs (10/01/2021)   PRAPARE - Hydrologist (Medical): No    Lack of Transportation (Non-Medical): No   Physical Activity: Inactive (04/10/2021)   Exercise Vital Sign    Days of Exercise per Week: 0 days    Minutes of Exercise per Session: 0 min  Stress: No Stress Concern Present (04/10/2021)   [place]    Feeling of Stress : Not at all  Social Connections:  Moderately Isolated (04/10/2021)   Social Connection and Isolation Panel [NHANES]    Frequency of Communication with Friends and Family: More than three times a week    Frequency of Social Gatherings with Friends and Family: Once a week    Attends Religious Services: Never    Marine scientist or Organizations: No    Attends Archivist Meetings: Never    Marital Status: Married  Human resources officer Violence: Not At Risk (04/10/2021)   Humiliation, Afraid, Rape, and Kick questionnaire    Fear of Current or Ex-Partner: No    Emotionally Abused: No    Physically Abused: No    Sexually Abused: No    Outpatient Medications Prior to Visit  Medication Sig Dispense Refill   metoprolol succinate (TOPROL-XL) 25 MG 24 hr tablet Take 1 tablet (25 mg total) by mouth daily. 90 tablet 3   ondansetron (ZOFRAN) 4 MG tablet Take 1 tablet (4 mg total) by mouth every 6 (six) hours as needed for nausea. 20 tablet 0   polyethylene glycol (MIRALAX / GLYCOLAX) 17 g packet Take 17 g by mouth daily as needed for mild constipation. 14 each 0   allopurinol (ZYLOPRIM) 100 MG tablet Take 2 tablets (200 mg total) by mouth daily. 180 tablet 3   atorvastatin (LIPITOR) 40 MG tablet Take 1 tablet (40 mg total) by mouth daily. 90 tablet 1   digoxin (LANOXIN) 0.125 MG tablet TAKE ONE TABLET BY MOUTH DAILY (Patient taking differently: Take 0.125 mg by mouth every other day.) 30 tablet 5   docusate sodium (COLACE) 100 MG capsule Take 1 capsule (100 mg total) by mouth 2 (two) times daily. (Patient not taking: Reported on 10/18/2021) 10 capsule 0   folic acid (FOLVITE) 1 MG tablet Take 1 tablet (1 mg  total) by mouth daily. 90 tablet 1   HYDROcodone-acetaminophen (NORCO/VICODIN) 5-325 MG tablet Take 1-2 tablets by mouth every 4 (four) hours as needed for severe pain. Start with 1 tablet every 4 hours as needed. Take 2 only for severe pain. (Patient not taking: Reported on 10/18/2021) 42 tablet 0   lisinopril (ZESTRIL) 20 MG tablet Take 1 tablet (20 mg total) by mouth daily. 90 tablet 3   methocarbamol (ROBAXIN) 500 MG tablet Take 1 tablet (500 mg total) by mouth every 6 (six) hours as needed for muscle spasms. (Patient not taking: Reported on 10/18/2021) 40 tablet 0   pantoprazole (PROTONIX) 40 MG tablet Take 1 tablet (40 mg total) by mouth 2 (two) times daily. 180 tablet 1   XARELTO 20 MG TABS tablet TAKE ONE TABLET BY MOUTH DAILY WITH SUPPER 30 tablet 0   No facility-administered medications prior to visit.    No Known Allergies  ROS Review of Systems  Constitutional:  Negative for appetite change, chills, fatigue and fever.  HENT:  Negative for congestion, dental problem, ear pain and sore throat.   Eyes:  Negative for discharge, redness and visual disturbance.  Respiratory:  Negative for cough, chest tightness, shortness of breath and wheezing.   Cardiovascular:  Negative for chest pain, palpitations and leg swelling.  Gastrointestinal:  Negative for abdominal pain, blood in stool, diarrhea, nausea and vomiting.  Genitourinary:  Negative for difficulty urinating, dysuria, flank pain, frequency, hematuria and urgency.  Musculoskeletal:  Negative for arthralgias, back pain, joint swelling, myalgias and neck stiffness.  Skin:  Negative for pallor and rash.  Neurological:  Negative for dizziness, speech difficulty, weakness and headaches.  Hematological:  Negative for adenopathy. Does  not bruise/bleed easily.  Psychiatric/Behavioral:  Negative for confusion and sleep disturbance. The patient is not nervous/anxious.     PE;    10/18/2021    1:16 PM 08/05/2021    9:36 AM 08/05/2021     6:13 AM  Vitals with BMI  Height 5' 8.9"    Weight 184 lbs 6 oz    BMI 18.56    Systolic [phone]  Diastolic 79 65 74  Pulse 73 93 86     Gen: Alert, well appearing.  Patient is oriented to person, place, time, and situation. AFFECT: pleasant, lucid thought and speech. ENT: Ears: EACs clear, normal epithelium.  TMs with good light reflex and landmarks bilaterally.  Eyes: no injection, icteris, swelling, or exudate.  EOMI, PERRLA. Nose: no drainage or turbinate edema/swelling.  No injection or focal lesion.  Mouth: lips without lesion/swelling.  Oral mucosa pink and moist.  Dentition intact and without obvious caries or gingival swelling.  Oropharynx without erythema, exudate, or swelling.  Neck: supple/nontender.  No LAD, mass, or TM.  Carotid pulses 2+ bilaterally, without bruits. CV: Irregularly irregular, no m/r/g.   LUNGS: CTA bilat, nonlabored resps, good aeration in all lung fields. ABD: soft, NT, ND, BS normal.  No hepatospenomegaly or mass.  No bruits. EXT: no clubbing, cyanosis, or edema.  Musculoskeletal: no joint swelling, erythema, warmth, or tenderness.  ROM of all joints intact. Right hip range of motion very limited. Skin - no sores or suspicious lesions or rashes or color changes Foot exam - no swelling, tenderness or skin or vascular lesions. Color and temperature is normal. Sensation is intact. Peripheral pulses are palpable. Toenails are normal.  Pertinent labs:  Lab Results  Component Value Date   TSH 1.20 05/15/2020   Lab Results  Component Value Date   WBC 14.2 (H) 08/05/2021   HGB 10.4 (L) 08/05/2021   HCT 31.7 (L) 08/05/2021   MCV 92.2 08/05/2021   PLT 205 08/05/2021   Lab Results  Component Value Date   CREATININE 1.02 08/04/2021   BUN 16 08/04/2021   NA 137 08/04/2021   K 4.1 08/04/2021   CL 100 08/04/2021   CO2 27 08/04/2021   Lab Results  Component Value Date   ALT 15 07/22/2021   AST 17 07/22/2021   ALKPHOS 79 07/22/2021   BILITOT  1.0 07/22/2021   Lab Results  Component Value Date   CHOL 127 11/20/2020   Lab Results  Component Value Date   HDL 59 11/20/2020   Lab Results  Component Value Date   LDLCALC 55 11/20/2020   Lab Results  Component Value Date   TRIG 51 11/20/2020   Lab Results  Component Value Date   CHOLHDL 2.2 11/20/2020   Lab Results  Component Value Date   PSA 2.09 05/15/2020   PSA 1.70 05/09/2019   PSA 1.90 07/31/2017   Lab Results  Component Value Date   HGBA1C 5.6 10/18/2021   HGBA1C 5.6 10/18/2021   HGBA1C 5.6 (A) 10/18/2021   HGBA1C 5.6 10/18/2021   ASSESSMENT AND PLAN:   #1 diabetes, well controlled with diet only. A1c today 5.6% Feet exam today: Normal today. Urine microalbumin/creatinine today.  2.  Hypertension, well controlled on Toprol-XL 25 mg a day and lisinopril 20 mg a day. Electrolytes and creatinine today.  3.  Hypercholesterolemia, doing well on atorvastatin 40 mg a day. Lipid panel and hepatic panel today.  4.  Status post right total hip arthroplasty 02/03/2021. Continue rehab.  He  has follow-up with his orthopedist on the 23rd of this month.  5.  Chronic atrial fibrillation. Stable/rate controlled on digoxin 0.125 mg a day and Toprol-XL 25 mg a day.  Stroke prophylaxis with Xarelto 20 mg a day. Electrolytes and CBC today.  6. Health maintenance exam: Reviewed age and gender appropriate health maintenance issues (prudent diet, regular exercise, health risks of tobacco and excessive alcohol, use of seatbelts, fire alarms in home, use of sunscreen).  Also reviewed age and gender appropriate health screening as well as vaccine recommendations. Vaccines: Flu today. Labs: HP, a1c, psa Prostate ca screening: PSA today. Colon ca screening: recall 07/2022  An After Visit Summary was printed and given to the patient.  FOLLOW UP:  No follow-ups on file.  Signed:  Crissie Sickles, MD           10/18/2021

## 2021-10-19 ENCOUNTER — Ambulatory Visit: Payer: Medicare HMO | Admitting: Physical Therapy

## 2021-10-19 ENCOUNTER — Encounter: Payer: Self-pay | Admitting: Physical Therapy

## 2021-10-19 DIAGNOSIS — M25551 Pain in right hip: Secondary | ICD-10-CM

## 2021-10-19 DIAGNOSIS — M6281 Muscle weakness (generalized): Secondary | ICD-10-CM | POA: Diagnosis not present

## 2021-10-19 DIAGNOSIS — R29898 Other symptoms and signs involving the musculoskeletal system: Secondary | ICD-10-CM

## 2021-10-19 DIAGNOSIS — R2689 Other abnormalities of gait and mobility: Secondary | ICD-10-CM | POA: Diagnosis not present

## 2021-10-19 LAB — COMPREHENSIVE METABOLIC PANEL
AG Ratio: 1.5 (calc) (ref 1.0–2.5)
ALT: 11 U/L (ref 9–46)
AST: 14 U/L (ref 10–35)
Albumin: 4.7 g/dL (ref 3.6–5.1)
Alkaline phosphatase (APISO): 107 U/L (ref 35–144)
BUN: 17 mg/dL (ref 7–25)
CO2: 31 mmol/L (ref 20–32)
Calcium: 9.7 mg/dL (ref 8.6–10.3)
Chloride: 100 mmol/L (ref 98–110)
Creat: 0.94 mg/dL (ref 0.70–1.35)
Globulin: 3.1 g/dL (calc) (ref 1.9–3.7)
Glucose, Bld: 92 mg/dL (ref 65–99)
Potassium: 4.4 mmol/L (ref 3.5–5.3)
Sodium: 141 mmol/L (ref 135–146)
Total Bilirubin: 0.6 mg/dL (ref 0.2–1.2)
Total Protein: 7.8 g/dL (ref 6.1–8.1)

## 2021-10-19 LAB — LIPID PANEL
Cholesterol: 152 mg/dL (ref <200)
HDL: 65 mg/dL (ref >=40)
LDL Cholesterol (Calc): 73 mg/dL (calc)
Non-HDL Cholesterol (Calc): 87 mg/dL (calc) (ref <130)
Total CHOL/HDL Ratio: 2.3 (calc) (ref <5.0)
Triglycerides: 61 mg/dL (ref <150)

## 2021-10-19 LAB — MICROALBUMIN / CREATININE URINE RATIO
Creatinine, Urine: 78 mg/dL (ref 20–320)
Microalb Creat Ratio: 23 mcg/mg creat (ref <30)
Microalb, Ur: 1.8 mg/dL

## 2021-10-19 LAB — CBC
HCT: 39.9 % (ref 38.5–50.0)
Hemoglobin: 13 g/dL — ABNORMAL LOW (ref 13.2–17.1)
MCH: 28.2 pg (ref 27.0–33.0)
MCHC: 32.6 g/dL (ref 32.0–36.0)
MCV: 86.6 fL (ref 80.0–100.0)
MPV: 11.2 fL (ref 7.5–12.5)
Platelets: 327 10*3/uL (ref 140–400)
RBC: 4.61 10*6/uL (ref 4.20–5.80)
RDW: 12.8 % (ref 11.0–15.0)
WBC: 6 10*3/uL (ref 3.8–10.8)

## 2021-10-19 NOTE — Therapy (Signed)
OUTPATIENT PHYSICAL THERAPY   Patient Name: Robert [name] MRN: [id number] DOB:1960/09/01, 61 y.o., male Today's Date: 10/19/2021   PT End of Session - 10/19/21 1612     Visit Number 7    Number of Visits 16    Date for PT Re-Evaluation 11/23/21    PT Start Time 4098    PT Stop Time 1612    PT Time Calculation (min) 42 min    Activity Tolerance Patient tolerated treatment well    Behavior During Therapy [facility] for tasks assessed/performed                  Past Medical History:  Diagnosis Date   Alcohol abuse    ongoing as of 07/2017.  Quit after CVA 05/2018   Arthritis    Atrial fibrillation ([place])    Not candidate for anticoagulation b/c of GI bleeding; had to get transfused.  Then, pt had CVA 05/2018 and he was put on xarelto and he quit drinking.   Coronary atherosclerosis    Noted on lung cancer screening CT   COVID-19 virus infection 06/2019   CVA (cerebral vascular accident) ([place]) 05/2018   large R MCA territory infarct.  CTA neg for large cerebral vessel occlusion.  R ICA 70% stenosis, L clear. Rpt CTA neck ordered by neuro as of 01/2019.   Gastric ulcer    hx of--unclear (pt and wife deny this)   Gout    Usually MTP joint   History of adenomatous polyp of colon 2021   recall 2024   History of digital clubbing    "all my adult life"--CXR 08/2016 = bronchitic changes o/w normal.   Hyperkalemia 04/2017   Normalized with decreasing lisinopril from 40 mg qd to 20 mg qd.   Hyperlipidemia 07/2016   Holding off on statin until pt quits drinking.  Back on statin after CVA 05/2018   Hypertension    Nonischemic cardiomyopathy ([place]) 2015; 2020   2015: "resolved" (w/medical therapy?).  05/2018 EF 35-40%, with TR and MR and ++LAE. 05/2019 EF 50-52%, low normal LV wall motion, grd II DD, mod MR and mod TR, mild pulm HTN.   Nonrheumatic mitral valve regurgitation    Osteoarthritis of right hip    Surgery being planned as of 05/2021 consult with Dr. Alvan Dame   Paralysis [facility])     some left side deficit from CVA in 2020   Pre-diabetes    Screening for lung cancer    04/2021 CT showed no lesions/nodules.  Repeat 1 year   Sleep apnea    Tobacco abuse    Quit 2014   Past Surgical History:  Procedure Laterality Date   CARDIAC CATHETERIZATION  2015   Normal coronaries   CARDIOVERSION     DCCV 2015   COLONOSCOPY  08/09/2019   One large adenoma-->recall 3 yrs   TONSILLECTOMY     TOTAL HIP ARTHROPLASTY Right 08/03/2021   Procedure: TOTAL HIP ARTHROPLASTY ANTERIOR APPROACH;  Surgeon: Paralee Cancel, MD;  Location: [facility];  Service: Orthopedics;  Laterality: Right;   TRANSTHORACIC ECHOCARDIOGRAM  06/08/2018; 05/2019   05/2018 (in context of acute CVA): EF 35-40%, mild/mod MR, mod/sev TR, severely dilated LA.  05/2019 EF 50-52%, low normal LV wall motion, grd II DD, mod MR and mod TR, mild pulm HTN.   TRANSTHORACIC ECHOCARDIOGRAM     07/06/21 EF 50-55%,normal WM, mild elev RVP, mild MR.   Patient Active Problem List   Diagnosis Date Noted   S/P total right hip arthroplasty  08/03/2021   Atrial fibrillation with RVR ([place]) 06/29/2019   General weakness 06/29/2019   Encounter for orogastric (OG) tube placement    AKI (acute kidney injury) ([place])    Acute idiopathic gout of right ankle    Leukocytosis    Oropharyngeal dysphagia    Family history of lower GI bleeding    Altered mental status    Endotracheal tube present    History of CVA with residual deficit 06/06/2018   Alcohol abuse 06/06/2018   Hyperglycemia 08/02/2016   Type II diabetes mellitus ([place]) 07/10/2016   Hyperlipidemia 03/19/2015   Hip arthritis 02/12/2015   Acute posthemorrhagic anemia 02/24/2013   Anemia 02/24/2013   Hypertension 02/24/2013   A-fib ([place]) 02/06/2013   Tricuspid regurgitation 02/06/2013   Non-ischemic cardiomyopathy ([place]) 02/06/2013   Febrile 02/05/2013   Nondependent alcohol abuse, continuous drinking behavior 02/05/2013    PCP: Dr Shawnie Dapper  REFERRING PROVIDER: Irving Copas, PA C Dr Paralee Cancel  REFERRING DIAG: Rt THA - anterior approach  THERAPY DIAG:  Pain in right hip  Other symptoms and signs involving the musculoskeletal system  Other abnormalities of gait and mobility  Muscle weakness (generalized)  Rationale for Evaluation and Treatment Rehabilitation  ONSET DATE: 08/03/21  SUBJECTIVE:   SUBJECTIVE STATEMENT: Patient states he walked to his mailbox and back this afternoon and that he is more sore from that. He states he still uses the cane for walking longer distance and walking outdoors.  PERTINENT HISTORY: CVA 05/2018; Arthritis; AODM; elevated cholesterol; A-fib; bilat hearing loss   PAIN:  Are you having pain? 2/10 Rt hip, soreness   PRECAUTIONS: Anterior hip  WEIGHT BEARING RESTRICTIONS No   PATIENT GOALS wants to get out and walk and exercise - has a side walk that is level   OBJECTIVE:    LOWER EXTREMITY ROM: assessed with pt in supine and sitting   Active ROM Right eval Left eval Right 9/29 Right 10/10  Hip flexion 80 100 82 82  Hip extension -20 -10 -7 -4  Hip abduction 15 23    Hip adduction neutral neutral    Hip internal rotation Tight  Tight     Hip external rotation Tight  Tight     Knee flexion 90 100    Knee extension -10 -10    Ankle dorsiflexion      Ankle plantarflexion      Ankle inversion      Ankle eversion       (Blank rows = not tested)  LOWER EXTREMITY MMT:  assessed with patient in supine and sitting  MMT Right eval Left eval  Hip flexion 3+/5 4+/5  Hip extension 3/5 3/5  Hip abduction 3/5 3/5  Hip adduction    Hip internal rotation    Hip external rotation    Knee flexion 4+/5 5/5  Knee extension 4+/5 5/5   Ankle dorsiflexion    Ankle plantarflexion    Ankle inversion    Ankle eversion     (Blank rows = not tested)  Gait: (10/6) gait without AD 5' with step through pattern, close supervision, decreased knee and hip flexion on Rt  TODAY'S  TREATMENT: 10/19/21 Nustep L5 x 5 min warm up  Standing: Forward lunge on 8'' step for self stretch Tap ups 12'' step 2 x 10 focus on hip flexion ROM Side lunge to Rt 10 x 5 sec hold Hip extension 2 x 10  Supine: Heel slide with LEs on physioball 2 x 10  with overpressure  Seated: Sit <> stand 2 x 5 with focus on forward wt shift Forward wt shift  x 10 with inhale/exhale Forward trunk flexion with hold 3 x 1 min Attempted hip flexion seated - unable without UE assist  Manual: PROM hip flexion and extension Jt mobs for hip flex and ext grade 2-3  10/15/21 Nustep L5 x 5 min  Gait training without AD with cues for hip/knee flexion on Rt 75', 50'  Seated: Forward trunk flexion x 10 with inhale/exhale    Sit <> stand x 5 with focus on forward wt shift    Standing   Side lunge to Rt 10 x 5 sec hold   Tap ups 10'' step 2 x 10 focus on hip and knee flexion ROM   Hip extension 2 x 10 cues to reduce forward lean   Supine: Heel slide with LEs on phisoball x 10 with 5 sec hold and overpressure LTR x 10 with overpressure Butterfly stretch 3 x 30 sec with overpressure  Manual: PROM hip flexion and extension Jt mobs for hip flex and ext grade 2-3  10/12/21 Nustep L5 x 5 min  Tap up 8'' step x 20 Side step 8' x10 Backward walk 8' x 10  Forward lean with inhale/exhale Sit <> stand 2 x 3 with focus on forward wt shift  LTR Hip flexor stretch Heel slide with LEs on physioball  Manual: Long axis hip distraction, jt mobs grade 2-3 for hip flex/ext PROM into hip flexion and extension  PATIENT EDUCATION:  Education details: POC; HEP Person educated: Patient Education method: Consulting civil engineer, Demonstration, Tactile cues, Verbal cues, and Handouts Education comprehension: verbalized understanding, returned demonstration, verbal cues required, tactile cues required, and needs further education   HOME EXERCISE PROGRAM: Access Code: [email or web address] URL:  [email or web address] Date: 09/28/2021 Prepared by: Gillermo Murdoch  Exercises - Supine Quad Set  - 2 x daily - 7 x weekly - 1 sets - 10 reps - 5 sec  hold - Hooklying Hamstring Stretch with Strap  - 2 x daily - 7 x weekly - 1 sets - 3 reps - 30 sec  hold - Hooklying Gluteal Sets  - 2 x daily - 7 x weekly - 1 sets - 10 reps - 5 sec  hold - Beginner Bridge  - 2 x daily - 7 x weekly - 1 sets - 10 reps - 5 sec  hold - Supine Heel Slide with Strap  - 2 x daily - 7 x weekly - 1 sets - 10 reps - 10 sec  hold - Sit to Stand with Armchair  - 2 x daily - 7 x weekly - 1 sets - 10 reps - 5 sec  hold  ASSESSMENT:  CLINICAL IMPRESSION: Pt continues to improve hip extension ROM. No change in hip flexion ROM since eval despite multiple modalities attempted. Continued to encourage pt to stretch hip flexion as much as possible at home   GOALS: Goals reviewed with patient? Yes   LONG TERM GOALS: Target date: 11/23/2021   Improve ROM/mobility/tissue extensibility Rt hip and LE to allow patient to achieve 95 to 100 deg hip flexion and 0 degrees hip extension  Baseline:  Goal status: INITIAL  2.  Improve strength Rt LE to 4+/5 throughout  Baseline:  Goal status: INITIAL  3.  Improve gait pattern with patient to demonstrate reciprocal gait pattern with more upright posture and alignment  Baseline:  Goal status: INITIAL  4.  Independent in all transitional movements  and transfers  Baseline:  Goal status: INITIAL  5.  Independent in HEP (including aquatic program as indicated) Baseline:  Goal status: INITIAL  6.  Improve functional limitation score to 61 Baseline:  Goal status: INITIAL   PLAN: PT FREQUENCY: 2x/week  PT DURATION: 8 weeks  PLANNED INTERVENTIONS: Therapeutic exercises, Therapeutic activity, Neuromuscular re-education, Balance training, Gait training, Patient/Family education, Self Care, DME instructions, Aquatic Therapy, Dry Needling, Electrical stimulation,  Cryotherapy, Moist heat, Taping, Ultrasound, Manual therapy, and Re-evaluation  PLAN FOR NEXT SESSION: review and progress exercises; practice transfers and transitional movements; work on posture and alignment in standing; gait training; manual work as indicated to anterior lateral Rt hip musculature   Reagan Klemz, PT 10/19/2021, 4:12 PM

## 2021-10-22 ENCOUNTER — Encounter: Payer: Self-pay | Admitting: Physical Therapy

## 2021-10-22 ENCOUNTER — Ambulatory Visit: Payer: Medicare HMO | Admitting: Physical Therapy

## 2021-10-22 DIAGNOSIS — R29898 Other symptoms and signs involving the musculoskeletal system: Secondary | ICD-10-CM

## 2021-10-22 DIAGNOSIS — R2689 Other abnormalities of gait and mobility: Secondary | ICD-10-CM | POA: Diagnosis not present

## 2021-10-22 DIAGNOSIS — M25551 Pain in right hip: Secondary | ICD-10-CM

## 2021-10-22 DIAGNOSIS — M6281 Muscle weakness (generalized): Secondary | ICD-10-CM | POA: Diagnosis not present

## 2021-10-22 NOTE — Therapy (Signed)
OUTPATIENT PHYSICAL THERAPY   Patient Name: Robert [name] MRN: [id number] DOB:01-30-1960, 61 y.o., male Today's Date: 10/22/2021   PT End of Session - 10/22/21 0849     Visit Number 8    Number of Visits 16    Date for PT Re-Evaluation 11/23/21    PT Start Time 0849    PT Stop Time 0930    PT Time Calculation (min) 41 min    Activity Tolerance Patient tolerated treatment well    Behavior During Therapy [facility] for tasks assessed/performed                  Past Medical History:  Diagnosis Date   Alcohol abuse    ongoing as of 07/2017.  Quit after CVA 05/2018   Arthritis    Atrial fibrillation ([place])    Not candidate for anticoagulation b/c of GI bleeding; had to get transfused.  Then, pt had CVA 05/2018 and he was put on xarelto and he quit drinking.   Coronary atherosclerosis    Noted on lung cancer screening CT   COVID-19 virus infection 06/2019   CVA (cerebral vascular accident) ([place]) 05/2018   large R MCA territory infarct.  CTA neg for large cerebral vessel occlusion.  R ICA 70% stenosis, L clear. Rpt CTA neck ordered by neuro as of 01/2019.   Gastric ulcer    hx of--unclear (pt and wife deny this)   Gout    Usually MTP joint   History of adenomatous polyp of colon 2021   recall 2024   History of digital clubbing    "all my adult life"--CXR 08/2016 = bronchitic changes o/w normal.   Hyperkalemia 04/2017   Normalized with decreasing lisinopril from 40 mg qd to 20 mg qd.   Hyperlipidemia 07/2016   Holding off on statin until pt quits drinking.  Back on statin after CVA 05/2018   Hypertension    Nonischemic cardiomyopathy ([place]) 2015; 2020   2015: "resolved" (w/medical therapy?).  05/2018 EF 35-40%, with TR and MR and ++LAE. 05/2019 EF 50-52%, low normal LV wall motion, grd II DD, mod MR and mod TR, mild pulm HTN.   Nonrheumatic mitral valve regurgitation    Osteoarthritis of right hip    Surgery being planned as of 05/2021 consult with Dr. Alvan Dame   Paralysis [facility])     some left side deficit from CVA in 2020   Pre-diabetes    Screening for lung cancer    04/2021 CT showed no lesions/nodules.  Repeat 1 year   Sleep apnea    Tobacco abuse    Quit 2014   Past Surgical History:  Procedure Laterality Date   CARDIAC CATHETERIZATION  2015   Normal coronaries   CARDIOVERSION     DCCV 2015   COLONOSCOPY  08/09/2019   One large adenoma-->recall 3 yrs   TONSILLECTOMY     TOTAL HIP ARTHROPLASTY Right 08/03/2021   Procedure: TOTAL HIP ARTHROPLASTY ANTERIOR APPROACH;  Surgeon: Paralee Cancel, MD;  Location: [facility];  Service: Orthopedics;  Laterality: Right;   TRANSTHORACIC ECHOCARDIOGRAM  06/08/2018; 05/2019   05/2018 (in context of acute CVA): EF 35-40%, mild/mod MR, mod/sev TR, severely dilated LA.  05/2019 EF 50-52%, low normal LV wall motion, grd II DD, mod MR and mod TR, mild pulm HTN.   TRANSTHORACIC ECHOCARDIOGRAM     07/06/21 EF 50-55%,normal WM, mild elev RVP, mild MR.   Patient Active Problem List   Diagnosis Date Noted   S/P total right hip arthroplasty  08/03/2021   Atrial fibrillation with RVR ([place]) 06/29/2019   General weakness 06/29/2019   Encounter for orogastric (OG) tube placement    AKI (acute kidney injury) ([place])    Acute idiopathic gout of right ankle    Leukocytosis    Oropharyngeal dysphagia    Family history of lower GI bleeding    Altered mental status    Endotracheal tube present    History of CVA with residual deficit 06/06/2018   Alcohol abuse 06/06/2018   Hyperglycemia 08/02/2016   Type II diabetes mellitus ([place]) 07/10/2016   Hyperlipidemia 03/19/2015   Hip arthritis 02/12/2015   Acute posthemorrhagic anemia 02/24/2013   Anemia 02/24/2013   Hypertension 02/24/2013   A-fib ([place]) 02/06/2013   Tricuspid regurgitation 02/06/2013   Non-ischemic cardiomyopathy ([place]) 02/06/2013   Febrile 02/05/2013   Nondependent alcohol abuse, continuous drinking behavior 02/05/2013    PCP: Dr Shawnie Dapper  REFERRING PROVIDER: Irving Copas, PA C Dr Paralee Cancel  REFERRING DIAG: Rt THA - anterior approach  THERAPY DIAG:  Pain in right hip  Other symptoms and signs involving the musculoskeletal system  Other abnormalities of gait and mobility  Muscle weakness (generalized)  Rationale for Evaluation and Treatment Rehabilitation  ONSET DATE: 08/03/21  SUBJECTIVE:   SUBJECTIVE STATEMENT: Pt states his hip is feeling good this morning. Got stiff during the car ride. Pt states he walked 50 yards yesterday.   PERTINENT HISTORY: CVA 05/2018; Arthritis; AODM; elevated cholesterol; A-fib; bilat hearing loss   PAIN:  Are you having pain? None   PRECAUTIONS: Anterior hip  WEIGHT BEARING RESTRICTIONS No   PATIENT GOALS wants to get out and walk and exercise - has a side walk that is level   OBJECTIVE:    LOWER EXTREMITY ROM: assessed with pt in supine and sitting   Active ROM Right eval Left eval Right 9/29 Right 10/10  Hip flexion 80 100 82 82  Hip extension -20 -10 -7 -4  Hip abduction 15 23    Hip adduction neutral neutral    Hip internal rotation Tight  Tight     Hip external rotation Tight  Tight     Knee flexion 90 100    Knee extension -10 -10    Ankle dorsiflexion      Ankle plantarflexion      Ankle inversion      Ankle eversion       (Blank rows = not tested)  LOWER EXTREMITY MMT:  assessed with patient in supine and sitting  MMT Right eval Left eval  Hip flexion 3+/5 4+/5  Hip extension 3/5 3/5  Hip abduction 3/5 3/5  Hip adduction    Hip internal rotation    Hip external rotation    Knee flexion 4+/5 5/5  Knee extension 4+/5 5/5   Ankle dorsiflexion    Ankle plantarflexion    Ankle inversion    Ankle eversion     (Blank rows = not tested)  Gait: (10/6) gait without AD 87' with step through pattern, close supervision, decreased knee and hip flexion on Rt  TODAY'S TREATMENT: 10/22/21 Nustep L6 x 5 min warm up  Standing: Forward lunge on 8" step 2x10 R&L with MWM  for hip flexion and hip ext   Sitting Feet flat lean forward/hip hinge 2x10  Supine Modified thomas stretch x 2 min Hip flexion with leg off EOB 2x10 Quad set 2x10 Hamstring stretch 2x30 sec  Manual therapy: Scar massage PROM hip flexion and extension Jt mobs  for hip flex and ext grade 2-3  Gait training X75' SPC, cues for heel/toe, knee swing, cane on left, narrower BOS   10/19/21 Nustep L5 x 5 min warm up  Standing: Forward lunge on 8'' step for self stretch Tap ups 12'' step 2 x 10 focus on hip flexion ROM Side lunge to Rt 10 x 5 sec hold Hip extension 2 x 10  Supine: Heel slide with LEs on physioball 2 x 10 with overpressure  Seated: Sit <> stand 2 x 5 with focus on forward wt shift Forward wt shift  x 10 with inhale/exhale Forward trunk flexion with hold 3 x 1 min Attempted hip flexion seated - unable without UE assist  Manual: PROM hip flexion and extension Jt mobs for hip flex and ext grade 2-3  10/15/21 Nustep L5 x 5 min  Gait training without AD with cues for hip/knee flexion on Rt 75', 50'  Seated: Forward trunk flexion x 10 with inhale/exhale    Sit <> stand x 5 with focus on forward wt shift    Standing   Side lunge to Rt 10 x 5 sec hold   Tap ups 10'' step 2 x 10 focus on hip and knee flexion ROM   Hip extension 2 x 10 cues to reduce forward lean   Supine: Heel slide with LEs on phisoball x 10 with 5 sec hold and overpressure LTR x 10 with overpressure Butterfly stretch 3 x 30 sec with overpressure  Manual: PROM hip flexion and extension Jt mobs for hip flex and ext grade 2-3   PATIENT EDUCATION:  Education details: POC; HEP Person educated: Patient Education method: Consulting civil engineer, Demonstration, Corporate treasurer cues, Verbal cues, and Handouts Education comprehension: verbalized understanding, returned demonstration, verbal cues required, tactile cues required, and needs further education   HOME EXERCISE PROGRAM: Access Code: [email or web address] URL:  [email or web address] Date: 09/28/2021 Prepared by: Gillermo Murdoch  Exercises - Supine Quad Set  - 2 x daily - 7 x weekly - 1 sets - 10 reps - 5 sec  hold - Hooklying Hamstring Stretch with Strap  - 2 x daily - 7 x weekly - 1 sets - 3 reps - 30 sec  hold - Hooklying Gluteal Sets  - 2 x daily - 7 x weekly - 1 sets - 10 reps - 5 sec  hold - Beginner Bridge  - 2 x daily - 7 x weekly - 1 sets - 10 reps - 5 sec  hold - Supine Heel Slide with Strap  - 2 x daily - 7 x weekly - 1 sets - 10 reps - 10 sec  hold - Sit to Stand with Armchair  - 2 x daily - 7 x weekly - 1 sets - 10 reps - 5 sec  hold  ASSESSMENT:  CLINICAL IMPRESSION: Hip extension is improving -- able to see improvements in gait due to this after giving pt cues. Continued to work on hip flexion; however, this remains the most hypomobile. Continued to stretch and strengthen as able.    GOALS: Goals reviewed with patient? Yes   LONG TERM GOALS: Target date: 11/23/2021   Improve ROM/mobility/tissue extensibility Rt hip and LE to allow patient to achieve 95 to 100 deg hip flexion and 0 degrees hip extension  Baseline:  Goal status: INITIAL  2.  Improve strength Rt LE to 4+/5 throughout  Baseline:  Goal status: INITIAL  3.  Improve gait pattern with patient to demonstrate reciprocal gait pattern with more upright posture and  alignment  Baseline:  Goal status: INITIAL  4.  Independent in all transitional movements and transfers  Baseline:  Goal status: INITIAL  5.  Independent in HEP (including aquatic program as indicated) Baseline:  Goal status: INITIAL  6.  Improve functional limitation score to 61 Baseline:  Goal status: INITIAL   PLAN: PT FREQUENCY: 2x/week  PT DURATION: 8 weeks  PLANNED INTERVENTIONS: Therapeutic exercises, Therapeutic activity, Neuromuscular re-education, Balance training, Gait training, Patient/Family education, Self Care, DME instructions, Aquatic Therapy, Dry Needling,  Electrical stimulation, Cryotherapy, Moist heat, Taping, Ultrasound, Manual therapy, and Re-evaluation  PLAN FOR NEXT SESSION: review and progress exercises; practice transfers and transitional movements; work on posture and alignment in standing; gait training; manual work as indicated to anterior lateral Rt hip musculature   Eulonda Andalon April Gordy Levan, PT, DPT 10/22/2021, 8:49 AM

## 2021-10-25 ENCOUNTER — Ambulatory Visit: Payer: Medicare HMO | Admitting: Physical Therapy

## 2021-10-27 ENCOUNTER — Ambulatory Visit: Payer: Medicare HMO | Admitting: Physical Therapy

## 2021-10-27 ENCOUNTER — Encounter: Payer: Self-pay | Admitting: Physical Therapy

## 2021-10-27 DIAGNOSIS — M6281 Muscle weakness (generalized): Secondary | ICD-10-CM | POA: Diagnosis not present

## 2021-10-27 DIAGNOSIS — R2689 Other abnormalities of gait and mobility: Secondary | ICD-10-CM | POA: Diagnosis not present

## 2021-10-27 DIAGNOSIS — M25551 Pain in right hip: Secondary | ICD-10-CM | POA: Diagnosis not present

## 2021-10-27 DIAGNOSIS — R29898 Other symptoms and signs involving the musculoskeletal system: Secondary | ICD-10-CM | POA: Diagnosis not present

## 2021-10-27 NOTE — Therapy (Signed)
OUTPATIENT PHYSICAL THERAPY   Patient Name: Robert [name] MRN: [id number] DOB:April 19, 1960, 61 y.o., male Today's Date: 10/27/2021   PT End of Session - 10/27/21 1611     Visit Number 9    Number of Visits 16    Date for PT Re-Evaluation 11/23/21    PT Start Time 9449    PT Stop Time 1609    PT Time Calculation (min) 45 min    Activity Tolerance Patient tolerated treatment well    Behavior During Therapy [facility] for tasks assessed/performed                   Past Medical History:  Diagnosis Date   Alcohol abuse    ongoing as of 07/2017.  Quit after CVA 05/2018   Arthritis    Atrial fibrillation ([place])    Not candidate for anticoagulation b/c of GI bleeding; had to get transfused.  Then, pt had CVA 05/2018 and he was put on xarelto and he quit drinking.   Coronary atherosclerosis    Noted on lung cancer screening CT   COVID-19 virus infection 06/2019   CVA (cerebral vascular accident) ([place]) 05/2018   large R MCA territory infarct.  CTA neg for large cerebral vessel occlusion.  R ICA 70% stenosis, L clear. Rpt CTA neck ordered by neuro as of 01/2019.   Gastric ulcer    hx of--unclear (pt and wife deny this)   Gout    Usually MTP joint   History of adenomatous polyp of colon 2021   recall 2024   History of digital clubbing    "all my adult life"--CXR 08/2016 = bronchitic changes o/w normal.   Hyperkalemia 04/2017   Normalized with decreasing lisinopril from 40 mg qd to 20 mg qd.   Hyperlipidemia 07/2016   Holding off on statin until pt quits drinking.  Back on statin after CVA 05/2018   Hypertension    Nonischemic cardiomyopathy ([place]) 2015; 2020   2015: "resolved" (w/medical therapy?).  05/2018 EF 35-40%, with TR and MR and ++LAE. 05/2019 EF 50-52%, low normal LV wall motion, grd II DD, mod MR and mod TR, mild pulm HTN.   Nonrheumatic mitral valve regurgitation    Osteoarthritis of right hip    Surgery being planned as of 05/2021 consult with Dr. Alvan Dame   Paralysis [facility])     some left side deficit from CVA in 2020   Pre-diabetes    Screening for lung cancer    04/2021 CT showed no lesions/nodules.  Repeat 1 year   Sleep apnea    Tobacco abuse    Quit 2014   Past Surgical History:  Procedure Laterality Date   CARDIAC CATHETERIZATION  2015   Normal coronaries   CARDIOVERSION     DCCV 2015   COLONOSCOPY  08/09/2019   One large adenoma-->recall 3 yrs   TONSILLECTOMY     TOTAL HIP ARTHROPLASTY Right 08/03/2021   Procedure: TOTAL HIP ARTHROPLASTY ANTERIOR APPROACH;  Surgeon: Paralee Cancel, MD;  Location: [facility];  Service: Orthopedics;  Laterality: Right;   TRANSTHORACIC ECHOCARDIOGRAM  06/08/2018; 05/2019   05/2018 (in context of acute CVA): EF 35-40%, mild/mod MR, mod/sev TR, severely dilated LA.  05/2019 EF 50-52%, low normal LV wall motion, grd II DD, mod MR and mod TR, mild pulm HTN.   TRANSTHORACIC ECHOCARDIOGRAM     07/06/21 EF 50-55%,normal WM, mild elev RVP, mild MR.   Patient Active Problem List   Diagnosis Date Noted   S/P total right hip  arthroplasty 08/03/2021   Atrial fibrillation with RVR ([place]) 06/29/2019   General weakness 06/29/2019   Encounter for orogastric (OG) tube placement    AKI (acute kidney injury) ([place])    Acute idiopathic gout of right ankle    Leukocytosis    Oropharyngeal dysphagia    Family history of lower GI bleeding    Altered mental status    Endotracheal tube present    History of CVA with residual deficit 06/06/2018   Alcohol abuse 06/06/2018   Hyperglycemia 08/02/2016   Type II diabetes mellitus ([place]) 07/10/2016   Hyperlipidemia 03/19/2015   Hip arthritis 02/12/2015   Acute posthemorrhagic anemia 02/24/2013   Anemia 02/24/2013   Hypertension 02/24/2013   A-fib ([place]) 02/06/2013   Tricuspid regurgitation 02/06/2013   Non-ischemic cardiomyopathy ([place]) 02/06/2013   Febrile 02/05/2013   Nondependent alcohol abuse, continuous drinking behavior 02/05/2013    PCP: Dr Shawnie Dapper  REFERRING PROVIDER: Irving Copas, PA C Dr Paralee Cancel  REFERRING DIAG: Rt THA - anterior approach  THERAPY DIAG:  Pain in right hip  Other symptoms and signs involving the musculoskeletal system  Other abnormalities of gait and mobility  Muscle weakness (generalized)  Rationale for Evaluation and Treatment Rehabilitation  ONSET DATE: 08/03/21  SUBJECTIVE:   SUBJECTIVE STATEMENT: Pt states he walked around [employer] and out to the car without increased pain. He states he still feels stiff. He returns to MD next week  PERTINENT HISTORY: CVA 05/2018; Arthritis; AODM; elevated cholesterol; A-fib; bilat hearing loss   PAIN:  Are you having pain? None   PRECAUTIONS: Anterior hip  WEIGHT BEARING RESTRICTIONS No   PATIENT GOALS wants to get out and walk and exercise - has a side walk that is level   OBJECTIVE:    LOWER EXTREMITY ROM: assessed with pt in supine and sitting   Active ROM Right eval Left eval Right 9/29 Right 10/10 10/18  Hip flexion 80 100 82 82   Hip extension -20 -10 -7 -4 0  Hip abduction 15 23     Hip adduction neutral neutral     Hip internal rotation Tight  Tight      Hip external rotation Tight  Tight      Knee flexion 90 100     Knee extension -10 -10     Ankle dorsiflexion       Ankle plantarflexion       Ankle inversion       Ankle eversion        (Blank rows = not tested)  LOWER EXTREMITY MMT:  assessed with patient in supine and sitting  MMT Right eval Left eval  Hip flexion 3+/5 4+/5  Hip extension 3/5 3/5  Hip abduction 3/5 3/5  Hip adduction    Hip internal rotation    Hip external rotation    Knee flexion 4+/5 5/5  Knee extension 4+/5 5/5   Ankle dorsiflexion    Ankle plantarflexion    Ankle inversion    Ankle eversion     (Blank rows = not tested)  Gait: (10/6) gait without AD 67' with step through pattern, close supervision, decreased knee and hip flexion on Rt  TODAY'S TREATMENT: 10/27/21 Nustep L6 x 5 min warm  up  Standing: Forward lunge on 8" step 2x10 R with MWM for hip flexion Hip extension 2 x 10 Sit <> Stand MWM 2 x 5  Supine: Hip flexor stretch leg off table x 2 min Quad set x 10 Heel  slides with LEs on physioball 2 x 10  Manual: Jt mobs to improve hip flexion and extension PROM hip flexion  Gait 75' x 2 without AD focus on step through pattern, knee swing   10/22/21 Nustep L6 x 5 min warm up  Standing: Forward lunge on 8" step 2x10 R&L with MWM for hip flexion and hip ext   Sitting Feet flat lean forward/hip hinge 2x10  Supine Modified thomas stretch x 2 min Hip flexion with leg off EOB 2x10 Quad set 2x10 Hamstring stretch 2x30 sec  Manual therapy: Scar massage PROM hip flexion and extension Jt mobs for hip flex and ext grade 2-3  Gait training X75' SPC, cues for heel/toe, knee swing, cane on left, narrower BOS   10/19/21 Nustep L5 x 5 min warm up  Standing: Forward lunge on 8'' step for self stretch Tap ups 12'' step 2 x 10 focus on hip flexion ROM Side lunge to Rt 10 x 5 sec hold Hip extension 2 x 10  Supine: Heel slide with LEs on physioball 2 x 10 with overpressure  Seated: Sit <> stand 2 x 5 with focus on forward wt shift Forward wt shift  x 10 with inhale/exhale Forward trunk flexion with hold 3 x 1 min Attempted hip flexion seated - unable without UE assist  Manual: PROM hip flexion and extension Jt mobs for hip flex and ext grade 2-3   PATIENT EDUCATION:  Education details: POC; HEP Person educated: Patient Education method: Consulting civil engineer, Demonstration, Tactile cues, Verbal cues, and Handouts Education comprehension: verbalized understanding, returned demonstration, verbal cues required, tactile cues required, and needs further education   HOME EXERCISE PROGRAM: Access Code: [email or web address] URL: [email or web address] Date: 09/28/2021 Prepared by: Gillermo Murdoch  Exercises - Supine Quad Set  - 2 x daily - 7 x weekly - 1 sets -  10 reps - 5 sec  hold - Hooklying Hamstring Stretch with Strap  - 2 x daily - 7 x weekly - 1 sets - 3 reps - 30 sec  hold - Hooklying Gluteal Sets  - 2 x daily - 7 x weekly - 1 sets - 10 reps - 5 sec  hold - Beginner Bridge  - 2 x daily - 7 x weekly - 1 sets - 10 reps - 5 sec  hold - Supine Heel Slide with Strap  - 2 x daily - 7 x weekly - 1 sets - 10 reps - 10 sec  hold - Sit to Stand with Armchair  - 2 x daily - 7 x weekly - 1 sets - 10 reps - 5 sec  hold  ASSESSMENT:  CLINICAL IMPRESSION: Hip extension is improving -- able to see improvements in gait due to this after giving pt cues. Continued to work on hip flexion; however, this remains the most hypomobile. Continued to stretch and strengthen as able.    GOALS: Goals reviewed with patient? Yes   LONG TERM GOALS: Target date: 11/23/2021   Improve ROM/mobility/tissue extensibility Rt hip and LE to allow patient to achieve 95 to 100 deg hip flexion and 0 degrees hip extension  Baseline:  Goal status: INITIAL  2.  Improve strength Rt LE to 4+/5 throughout  Baseline:  Goal status: INITIAL  3.  Improve gait pattern with patient to demonstrate reciprocal gait pattern with more upright posture and alignment  Baseline:  Goal status: INITIAL  4.  Independent in all transitional movements and transfers  Baseline:  Goal status: INITIAL  5.  Independent in  HEP (including aquatic program as indicated) Baseline:  Goal status: INITIAL  6.  Improve functional limitation score to 61 Baseline:  Goal status: INITIAL   PLAN: PT FREQUENCY: 2x/week  PT DURATION: 8 weeks  PLANNED INTERVENTIONS: Therapeutic exercises, Therapeutic activity, Neuromuscular re-education, Balance training, Gait training, Patient/Family education, Self Care, DME instructions, Aquatic Therapy, Dry Needling, Electrical stimulation, Cryotherapy, Moist heat, Taping, Ultrasound, Manual therapy, and Re-evaluation  PLAN FOR NEXT SESSION: NOTE FOR MD!! - Rt hip  flexion, strengthening, transitional movements   Arda Keadle, PT, DPT 10/27/2021, 4:12 PM

## 2021-10-29 ENCOUNTER — Encounter: Payer: Self-pay | Admitting: Rehabilitative and Restorative Service Providers"

## 2021-10-29 ENCOUNTER — Ambulatory Visit: Payer: Medicare HMO | Admitting: Rehabilitative and Restorative Service Providers"

## 2021-10-29 DIAGNOSIS — M6281 Muscle weakness (generalized): Secondary | ICD-10-CM | POA: Diagnosis not present

## 2021-10-29 DIAGNOSIS — M25551 Pain in right hip: Secondary | ICD-10-CM

## 2021-10-29 DIAGNOSIS — R2689 Other abnormalities of gait and mobility: Secondary | ICD-10-CM | POA: Diagnosis not present

## 2021-10-29 DIAGNOSIS — R29898 Other symptoms and signs involving the musculoskeletal system: Secondary | ICD-10-CM

## 2021-10-29 NOTE — Therapy (Addendum)
OUTPATIENT PHYSICAL THERAPY and PROGRESS NOTE   Patient Name: Robert [name] MRN: [id number] DOB:08-Nov-1960, 61 y.o., male Today's Date: 10/29/2021   Physical Therapy Progress Note   Dates of Reporting Period:09/28/21-10/29/21   Objective Measurements: see below  Reason Skilled Services are Required: patient continues with functional limitations with h/o chronic R hip impairments with surgery July 2023.  Thank you for the referral of this patient.    PT End of Session - 10/29/21 0854     Visit Number 10    Number of Visits 16    Date for PT Re-Evaluation 11/23/21    PT Start Time 0850    PT Stop Time 0930    PT Time Calculation (min) 40 min    Activity Tolerance Patient tolerated treatment well    Behavior During Therapy [facility] for tasks assessed/performed               Past Medical History:  Diagnosis Date   Alcohol abuse    ongoing as of 07/2017.  Quit after CVA 05/2018   Arthritis    Atrial fibrillation ([place])    Not candidate for anticoagulation b/c of GI bleeding; had to get transfused.  Then, pt had CVA 05/2018 and he was put on xarelto and he quit drinking.   Coronary atherosclerosis    Noted on lung cancer screening CT   COVID-19 virus infection 06/2019   CVA (cerebral vascular accident) ([place]) 05/2018   large R MCA territory infarct.  CTA neg for large cerebral vessel occlusion.  R ICA 70% stenosis, L clear. Rpt CTA neck ordered by neuro as of 01/2019.   Gastric ulcer    hx of--unclear (pt and wife deny this)   Gout    Usually MTP joint   History of adenomatous polyp of colon 2021   recall 2024   History of digital clubbing    "all my adult life"--CXR 08/2016 = bronchitic changes o/w normal.   Hyperkalemia 04/2017   Normalized with decreasing lisinopril from 40 mg qd to 20 mg qd.   Hyperlipidemia 07/2016   Holding off on statin until pt quits drinking.  Back on statin after CVA 05/2018   Hypertension    Nonischemic cardiomyopathy ([place]) 2015; 2020   2015:  "resolved" (w/medical therapy?).  05/2018 EF 35-40%, with TR and MR and ++LAE. 05/2019 EF 50-52%, low normal LV wall motion, grd II DD, mod MR and mod TR, mild pulm HTN.   Nonrheumatic mitral valve regurgitation    Osteoarthritis of right hip    Surgery being planned as of 05/2021 consult with Dr. Alvan Dame   Paralysis [facility])    some left side deficit from CVA in 2020   Pre-diabetes    Screening for lung cancer    04/2021 CT showed no lesions/nodules.  Repeat 1 year   Sleep apnea    Tobacco abuse    Quit 2014   Past Surgical History:  Procedure Laterality Date   CARDIAC CATHETERIZATION  2015   Normal coronaries   CARDIOVERSION     DCCV 2015   COLONOSCOPY  08/09/2019   One large adenoma-->recall 3 yrs   TONSILLECTOMY     TOTAL HIP ARTHROPLASTY Right 08/03/2021   Procedure: TOTAL HIP ARTHROPLASTY ANTERIOR APPROACH;  Surgeon: Paralee Cancel, MD;  Location: [facility];  Service: Orthopedics;  Laterality: Right;   TRANSTHORACIC ECHOCARDIOGRAM  06/08/2018; 05/2019   05/2018 (in context of acute CVA): EF 35-40%, mild/mod MR, mod/sev TR, severely dilated LA.  05/2019 EF 50-52%, low normal  LV wall motion, grd II DD, mod MR and mod TR, mild pulm HTN.   TRANSTHORACIC ECHOCARDIOGRAM     07/06/21 EF 50-55%,normal WM, mild elev RVP, mild MR.   Patient Active Problem List   Diagnosis Date Noted   S/P total right hip arthroplasty 08/03/2021   Atrial fibrillation with RVR ([place]) 06/29/2019   General weakness 06/29/2019   Encounter for orogastric (OG) tube placement    AKI (acute kidney injury) ([place])    Acute idiopathic gout of right ankle    Leukocytosis    Oropharyngeal dysphagia    Family history of lower GI bleeding    Altered mental status    Endotracheal tube present    History of CVA with residual deficit 06/06/2018   Alcohol abuse 06/06/2018   Hyperglycemia 08/02/2016   Type II diabetes mellitus ([place]) 07/10/2016   Hyperlipidemia 03/19/2015   Hip arthritis 02/12/2015   Acute posthemorrhagic anemia  02/24/2013   Anemia 02/24/2013   Hypertension 02/24/2013   A-fib ([place]) 02/06/2013   Tricuspid regurgitation 02/06/2013   Non-ischemic cardiomyopathy ([place]) 02/06/2013   Febrile 02/05/2013   Nondependent alcohol abuse, continuous drinking behavior 02/05/2013    PCP: Dr Shawnie Dapper  REFERRING PROVIDER: Irving Copas, PA C Dr Paralee Cancel  REFERRING DIAG: Rt THA - anterior approach  THERAPY DIAG:  Pain in right hip  Other symptoms and signs involving the musculoskeletal system  Other abnormalities of gait and mobility  Muscle weakness (generalized)  Rationale for Evaluation and Treatment Rehabilitation  ONSET DATE: 08/03/21  SUBJECTIVE:   SUBJECTIVE STATEMENT: The patient reports his L knee is hurting today and he has a catch with walking.  He does not have R hip pain, but cannot get better motion.  PERTINENT HISTORY: CVA 05/2018; Arthritis; AODM; elevated cholesterol; A-fib; bilat hearing loss   PAIN:  Are you having pain? Yes: NPRS scale: 5/10 Pain location: L knee Pain description: catching in medial knee Aggravating factors: walking Relieving factors: unsure  PRECAUTIONS: Anterior hip  WEIGHT BEARING RESTRICTIONS No  PATIENT GOALS wants to get out and walk and exercise - has a side walk that is level   OBJECTIVE:  LOWER EXTREMITY ROM: assessed with pt in supine and sitting   Active ROM Right eval Left eval Right 9/29 Right 10/10 10/18 10/20  Hip flexion 80 100 82 82  84  Hip extension -20 -10 -7 -4 0 0  Hip abduction 15 23      Hip adduction neutral neutral      Hip internal rotation Tight  Tight     tight  tight Tight  Tight     tight  Knee flexion 90 100      Knee extension -10 -10       LOWER EXTREMITY MMT:  assessed with patient in supine and sitting  MMT Right eval Left eval Right  10/20 Left  10/20  Hip flexion 3+/5 4+/5 3+/5 5/5  Hip extension 3/5 3/5    Hip abduction 3/5 3/5    Hip adduction      Hip internal rotation      Hip  external rotation      Knee flexion 4+/5 5/5 4+/5 5/5  Knee extension 4+/5 5/5  4+/5 5/5   (Blank rows = not tested)  TODAY'S TREATMENT: 10/29/21: THEREX Nustep x 5 minutes LEs only level 4 Standing anterior rocking with one LE supported on 8" step-- added trunk rotation to deepen hip stretch with passive overpressure Thomas test stretch R  with isometric contractions into flexion x 3 reps, followed by hip extension stretch  MANUAL PA mobs lumbar spine grade II for general mobility PA mobs sacrum grade II due to pelvic and lumbar tightness  P/ROM R hip into flexion Gentle joint distraction through R LE Contract/relax with R knee moving knee to chest and passive overpressure Scar massage  GAIT   Emphasized longer stride length x 160 feet with cues on hitting heel strike and translating pelvic anteriorly  10/27/21 Nustep L6 x 5 min warm up  Standing: Forward lunge on 8" step 2x10 R with MWM for hip flexion Hip extension 2 x 10 Sit <> Stand MWM 2 x 5  Supine: Hip flexor stretch leg off table x 2 min Quad set x 10 Heel slides with LEs on physioball 2 x 10  Manual: Jt mobs to improve hip flexion and extension PROM hip flexion  Gait 75' x 2 without AD focus on step through pattern, knee swing  10/22/21 Nustep L6 x 5 min warm up  Standing: Forward lunge on 8" step 2x10 R&L with MWM for hip flexion and hip ext   Sitting Feet flat lean forward/hip hinge 2x10  Supine Modified thomas stretch x 2 min Hip flexion with leg off EOB 2x10 Quad set 2x10 Hamstring stretch 2x30 sec  Manual therapy: Scar massage PROM hip flexion and extension Jt mobs for hip flex and ext grade 2-3  Gait training X75' SPC, cues for heel/toe, knee swing, cane on left, narrower BOS  PATIENT EDUCATION:  Education details: gait with increased stride, stretching in home, frequent walking in home Person educated: Patient Education method: Explanation Education comprehension: verbalized  understanding and needs further education  HOME EXERCISE PROGRAM: Access Code: [email or web address] URL: [email or web address] Date: 09/28/2021 Prepared by: Gillermo Murdoch  Exercises - Supine Quad Set  - 2 x daily - 7 x weekly - 1 sets - 10 reps - 5 sec  hold - Hooklying Hamstring Stretch with Strap  - 2 x daily - 7 x weekly - 1 sets - 3 reps - 30 sec  hold - Hooklying Gluteal Sets  - 2 x daily - 7 x weekly - 1 sets - 10 reps - 5 sec  hold - Beginner Bridge  - 2 x daily - 7 x weekly - 1 sets - 10 reps - 5 sec  hold - Supine Heel Slide with Strap  - 2 x daily - 7 x weekly - 1 sets - 10 reps - 10 sec  hold - Sit to Stand with Armchair  - 2 x daily - 7 x weekly - 1 sets - 10 reps - 5 sec  hold  ASSESSMENT:  CLINICAL IMPRESSION: The patient is making slow gains in hip ROM.  He is limited functionally in stand>sit due to limited R hip flexion.  He has R anterior hip discomfort with ROM and palpable bony prominence.  Patient is seeing MD on Monday.  PT plans to continue working towards LTGs emphasizing functional ROM, balance, general mobility, strengthening for functional tasks.  GOALS: Goals reviewed with patient? Yes   LONG TERM GOALS: Target date: 11/23/2021   Improve ROM/mobility/tissue extensibility Rt hip and LE to allow patient to achieve 95 to 100 deg hip flexion and 0 degrees hip extension  Baseline:  Goal status: IN PROGRESS  2.  Improve strength Rt LE to 4+/5 throughout  Baseline:  Goal status: IN PROGRESS  3.  Improve gait pattern with patient to demonstrate reciprocal gait pattern with more  upright posture and alignment  Baseline:  Goal status: IN PROGRESS  4.  Independent in all transitional movements and transfers  Baseline:  Goal status: IN PROGRESS  5.  Independent in HEP (including aquatic program as indicated) Baseline:  Goal status: IN PROGRESS  6.  Improve functional limitation score to 61 Baseline:  Goal status: IN PROGRESS  PLAN: PT FREQUENCY:  2x/week  PT DURATION: 8 weeks  PLANNED INTERVENTIONS: Therapeutic exercises, Therapeutic activity, Neuromuscular re-education, Balance training, Gait training, Patient/Family education, Self Care, DME instructions, Aquatic Therapy, Dry Needling, Electrical stimulation, Cryotherapy, Moist heat, Taping, Ultrasound, Manual therapy, and Re-evaluation  PLAN FOR NEXT SESSION: Rt hip flexion, strengthening, transitional movements  PHYSICAL THERAPY DISCHARGE SUMMARY  Visits from Start of Care: 10  Current functional level related to goals / functional outcomes: Improving strength and ROM   Remaining deficits: See above   Education / Equipment: HEP   Patient agrees to discharge. Patient goals were partially met. Patient is being discharged due to not returning since the last visit. Isabelle Course, PT,DPT11/21/231:26 PM  [place], PT 10/29/2021, 8:54 AM

## 2021-11-01 DIAGNOSIS — Z96641 Presence of right artificial hip joint: Secondary | ICD-10-CM | POA: Diagnosis not present

## 2021-12-20 ENCOUNTER — Other Ambulatory Visit: Payer: Self-pay

## 2022-01-05 ENCOUNTER — Other Ambulatory Visit: Payer: Self-pay

## 2022-01-05 DIAGNOSIS — I10 Essential (primary) hypertension: Secondary | ICD-10-CM

## 2022-01-05 MED ORDER — METOPROLOL SUCCINATE ER 25 MG PO TB24: 25.0000 mg | ORAL_TABLET | Freq: Every day | ORAL | 0 refills | Status: DC

## 2022-01-18 DIAGNOSIS — I4892 Unspecified atrial flutter: Secondary | ICD-10-CM | POA: Diagnosis not present

## 2022-01-18 DIAGNOSIS — I4891 Unspecified atrial fibrillation: Secondary | ICD-10-CM | POA: Diagnosis not present

## 2022-02-14 ENCOUNTER — Encounter: Payer: Self-pay | Admitting: Family Medicine

## 2022-02-14 ENCOUNTER — Ambulatory Visit (INDEPENDENT_AMBULATORY_CARE_PROVIDER_SITE_OTHER): Payer: Medicare HMO | Admitting: Family Medicine

## 2022-02-14 VITALS — BP 135/70 | HR 78 | Temp 98.6°F | Ht 68.9 in | Wt 186.0 lb

## 2022-02-14 DIAGNOSIS — R051 Acute cough: Secondary | ICD-10-CM

## 2022-02-14 DIAGNOSIS — J988 Other specified respiratory disorders: Secondary | ICD-10-CM | POA: Diagnosis not present

## 2022-02-14 DIAGNOSIS — U071 COVID-19: Secondary | ICD-10-CM | POA: Diagnosis not present

## 2022-02-14 LAB — POC COVID19 BINAXNOW: SARS Coronavirus 2 Ag: POSITIVE — AB

## 2022-02-14 MED ORDER — MOLNUPIRAVIR EUA 200MG CAPSULE: 4.0000 | ORAL_CAPSULE | Freq: Two times a day (BID) | ORAL | 0 refills | Status: AC

## 2022-02-14 MED ORDER — NIRMATRELVIR/RITONAVIR (PAXLOVID)TABLET: 3.0000 | ORAL_TABLET | Freq: Two times a day (BID) | ORAL | 0 refills | Status: DC

## 2022-02-14 NOTE — Progress Notes (Addendum)
OFFICE VISIT  02/14/2022  CC:  Chief Complaint  Patient presents with   Covid Exposure    Pt c/o cough, post nasal drip, nasal bleeding, HA, chills x 2 days    Patient is a 62 y.o. male who presents for nosebleeds.  HPI: 2d runny nose, PND, cough, fatigue.  Has chronic problem with mild/brief nose bleeds.  Has chronic clear rhinorrhea.  No fever, no CP, no SOB.  No n/v/d. Wife currently with covid.  He is on digoxin and xarelto.  Past Medical History:  Diagnosis Date   Alcohol abuse    ongoing as of 07/2017.  Quit after CVA 05/2018   Arthritis    Atrial fibrillation ([place])    Not candidate for anticoagulation b/c of GI bleeding; had to get transfused.  Then, pt had CVA 05/2018 and he was put on xarelto and he quit drinking.   Coronary atherosclerosis    Noted on lung cancer screening CT   COVID-19 virus infection 06/2019   CVA (cerebral vascular accident) ([place]) 05/2018   large R MCA territory infarct.  CTA neg for large cerebral vessel occlusion.  R ICA 70% stenosis, L clear. Rpt CTA neck ordered by neuro as of 01/2019.   Gastric ulcer    hx of--unclear (pt and wife deny this)   Gout    Usually MTP joint   History of adenomatous polyp of colon 2021   recall 2024   History of digital clubbing    "all my adult life"--CXR 08/2016 = bronchitic changes o/w normal.   Hyperkalemia 04/2017   Normalized with decreasing lisinopril from 40 mg qd to 20 mg qd.   Hyperlipidemia 07/2016   Holding off on statin until pt quits drinking.  Back on statin after CVA 05/2018   Hypertension    Nonischemic cardiomyopathy ([place]) 2015; 2020   2015: "resolved" (w/medical therapy?).  05/2018 EF 35-40%, with TR and MR and ++LAE. 05/2019 EF 50-52%, low normal LV wall motion, grd II DD, mod MR and mod TR, mild pulm HTN.   Nonrheumatic mitral valve regurgitation    Osteoarthritis of right hip    Surgery being planned as of 05/2021 consult with Dr. Alvan Dame   Paralysis [facility])    some left side deficit from CVA in  2020   Pre-diabetes    Screening for lung cancer    04/2021 CT showed no lesions/nodules.  Repeat 1 year   Sleep apnea    Tobacco abuse    Quit 2014    Past Surgical History:  Procedure Laterality Date   CARDIAC CATHETERIZATION  2015   Normal coronaries   CARDIOVERSION     DCCV 2015   COLONOSCOPY  08/09/2019   One large adenoma-->recall 3 yrs   TONSILLECTOMY     TOTAL HIP ARTHROPLASTY Right 08/03/2021   Procedure: TOTAL HIP ARTHROPLASTY ANTERIOR APPROACH;  Surgeon: Paralee Cancel, MD;  Location: [facility];  Service: Orthopedics;  Laterality: Right;   TRANSTHORACIC ECHOCARDIOGRAM  06/08/2018; 05/2019   05/2018 (in context of acute CVA): EF 35-40%, mild/mod MR, mod/sev TR, severely dilated LA.  05/2019 EF 50-52%, low normal LV wall motion, grd II DD, mod MR and mod TR, mild pulm HTN.   TRANSTHORACIC ECHOCARDIOGRAM     07/06/21 EF 50-55%,normal WM, mild elev RVP, mild MR.    Outpatient Medications Prior to Visit  Medication Sig Dispense Refill   allopurinol (ZYLOPRIM) 100 MG tablet Take 2 tablets (200 mg total) by mouth daily. 180 tablet 1   atorvastatin (LIPITOR)  40 MG tablet Take 1 tablet (40 mg total) by mouth daily. 90 tablet 1   folic acid (FOLVITE) 1 MG tablet Take 1 tablet (1 mg total) by mouth daily. 90 tablet 1   lisinopril (ZESTRIL) 20 MG tablet Take 1 tablet (20 mg total) by mouth daily. 90 tablet 1   metoprolol succinate (TOPROL-XL) 25 MG 24 hr tablet Take 1 tablet (25 mg total) by mouth daily. 90 tablet 0   ondansetron (ZOFRAN) 4 MG tablet Take 1 tablet (4 mg total) by mouth every 6 (six) hours as needed for nausea. 20 tablet 0   pantoprazole (PROTONIX) 40 MG tablet Take 1 tablet (40 mg total) by mouth 2 (two) times daily. 180 tablet 1   polyethylene glycol (MIRALAX / GLYCOLAX) 17 g packet Take 17 g by mouth daily as needed for mild constipation. 14 each 0   digoxin (LANOXIN) 0.125 MG tablet Take 1 tablet (0.125 mg total) by mouth every other day. 90 tablet 1   XARELTO 20 MG  TABS tablet TAKE ONE TABLET BY MOUTH DAILY WITH SUPPER 90 tablet 1   No facility-administered medications prior to visit.    No Known Allergies  Review of Systems  As per HPI  PE:    02/14/2022    4:13 PM 10/18/2021    1:16 PM 08/05/2021    9:36 AM  Vitals with BMI  Height 5' 8.9" 5' 8.9"   Weight 186 lbs 184 lbs 6 oz   BMI 51.02 58.52   Systolic 778 242 353  Diastolic 70 79 65  Pulse 78 73 93     Physical Exam  VS: noted--normal. Gen: alert, NAD, NONTOXIC APPEARING. HEENT: eyes without injection, drainage, or swelling.  Ears: EACs clear, TMs with normal light reflex and landmarks.  Nose: Clear rhinorrhea, with some dried, crusty exudate adherent to mildly injected mucosa.  No purulent d/c.  No paranasal sinus TTP.  No facial swelling.  Throat and mouth without focal lesion.  No pharyngial swelling, erythema, or exudate.   Neck: supple, no LAD.   LUNGS: CTA bilat, nonlabored resps.   CV: irreg irreg, rate 80, no m/r/g. EXT: no c/c/e SKIN: no rash   LABS:  Last CBC Lab Results  Component Value Date   WBC 6.0 10/18/2021   HGB 13.0 (L) 10/18/2021   HCT 39.9 10/18/2021   MCV 86.6 10/18/2021   MCH 28.2 10/18/2021   RDW 12.8 10/18/2021   PLT 327 61/44/3154   Last metabolic panel Lab Results  Component Value Date   GLUCOSE 92 10/18/2021   NA 141 10/18/2021   K 4.4 10/18/2021   CL 100 10/18/2021   CO2 31 10/18/2021   BUN 17 10/18/2021   CREATININE 0.94 10/18/2021   GFRNONAA >60 08/04/2021   CALCIUM 9.7 10/18/2021   PHOS 2.9 06/13/2018   PROT 7.8 10/18/2021   ALBUMIN 4.6 07/22/2021   BILITOT 0.6 10/18/2021   ALKPHOS 79 07/22/2021   AST 14 10/18/2021   ALT 11 10/18/2021   ANIONGAP 10 08/04/2021   Last hemoglobin A1c Lab Results  Component Value Date   HGBA1C 5.6 10/18/2021   HGBA1C 5.6 10/18/2021   HGBA1C 5.6 (A) 10/18/2021   HGBA1C 5.6 10/18/2021   IMPRESSION AND PLAN:  Covid resp infection, mild/mod. High risk for complications. He is w/in the  window for antiviral use. GFR 80. Paxlovid contraindicated d/t potential interaction with digoxin and xarelto. Will rx molnupiravir x 5d.  An After Visit Summary was printed and given to the  patient.  FOLLOW UP: Return if symptoms worsen or fail to improve.  Signed:  Crissie Sickles, MD           02/14/2022

## 2022-02-15 ENCOUNTER — Telehealth: Payer: Self-pay | Admitting: Family Medicine

## 2022-02-15 NOTE — Telephone Encounter (Signed)
Unfortunately, no he cannot take paxlovid with xarelto OR digoxin (he is on both). Sorry!

## 2022-02-15 NOTE — Telephone Encounter (Signed)
Please advise on possible interactions and if there is an alt medication for COVID.

## 2022-02-15 NOTE — Telephone Encounter (Signed)
See other encounter

## 2022-02-15 NOTE — Telephone Encounter (Signed)
Kie would like to know if he can take the paxlovid with Xarelto? Because the other medication he was prescribed is too expensive. He also said that you can speak with his wife about anything and to call her on her cell at [phone]

## 2022-02-15 NOTE — Telephone Encounter (Signed)
Called wife and informed her that I couldn't speak to her due to lack of paperwork.  Wife handed pt phone. I informed pt that wife had call on his behalf and we cannot discuss anything without his permission. Pt was instructed to call insurance to see what is covered for covid (formulary list).   Pt verbalized understanding.

## 2022-02-15 NOTE — Telephone Encounter (Signed)
Pts wife called Shelia. She unfortunately is not on his updated DPR so I could not share information with her. She reports that usually when she calls she is able to get information. Sheis asking if Robert [name] tested positive for Covid and if there is a different medication he can be prescribed because it was $1000. I told her I would send the  message back, but I could not give her any information.

## 2022-02-15 NOTE — Telephone Encounter (Signed)
Pt informed

## 2022-02-15 NOTE — Telephone Encounter (Signed)
Robert [name] E45 minutes ago (10:19 AM)   [name] would like to know if he can take the paxlovid with Xarelto? Because the other medication he was prescribed is too expensive. He also said that you can speak with his wife about anything and to call her on her cell at [phone]

## 2022-04-09 ENCOUNTER — Other Ambulatory Visit: Payer: Self-pay | Admitting: Family Medicine

## 2022-04-09 DIAGNOSIS — I10 Essential (primary) hypertension: Secondary | ICD-10-CM

## 2022-04-19 ENCOUNTER — Ambulatory Visit: Payer: Medicare HMO | Admitting: Family Medicine

## 2022-04-19 DIAGNOSIS — I10 Essential (primary) hypertension: Secondary | ICD-10-CM

## 2022-04-21 ENCOUNTER — Encounter: Payer: Self-pay | Admitting: Family Medicine

## 2022-04-21 ENCOUNTER — Ambulatory Visit (INDEPENDENT_AMBULATORY_CARE_PROVIDER_SITE_OTHER): Payer: Medicare HMO | Admitting: Family Medicine

## 2022-04-21 VITALS — BP 120/76 | HR 75 | Wt 192.6 lb

## 2022-04-21 DIAGNOSIS — I482 Chronic atrial fibrillation, unspecified: Secondary | ICD-10-CM

## 2022-04-21 DIAGNOSIS — E7800 Pure hypercholesterolemia, unspecified: Secondary | ICD-10-CM | POA: Diagnosis not present

## 2022-04-21 DIAGNOSIS — I10 Essential (primary) hypertension: Secondary | ICD-10-CM

## 2022-04-21 DIAGNOSIS — E119 Type 2 diabetes mellitus without complications: Secondary | ICD-10-CM

## 2022-04-21 LAB — POCT GLYCOSYLATED HEMOGLOBIN (HGB A1C)
HbA1c POC (<> result, manual entry): 6 % (ref 4.0–5.6)
HbA1c, POC (controlled diabetic range): 6 % (ref 0.0–7.0)
HbA1c, POC (prediabetic range): 6 % (ref 5.7–6.4)
Hemoglobin A1C: 6 % — AB (ref 4.0–5.6)

## 2022-04-21 MED ORDER — FOLIC ACID 1 MG PO TABS: 1.0000 mg | ORAL_TABLET | Freq: Every day | ORAL | 1 refills | Status: DC

## 2022-04-21 MED ORDER — PANTOPRAZOLE SODIUM 40 MG PO TBEC: 40.0000 mg | DELAYED_RELEASE_TABLET | Freq: Two times a day (BID) | ORAL | 1 refills | Status: DC

## 2022-04-21 MED ORDER — LISINOPRIL 20 MG PO TABS: 20.0000 mg | ORAL_TABLET | Freq: Every day | ORAL | 1 refills | Status: DC

## 2022-04-21 MED ORDER — ALLOPURINOL 100 MG PO TABS: 200.0000 mg | ORAL_TABLET | Freq: Every day | ORAL | 1 refills | Status: DC

## 2022-04-21 MED ORDER — METOPROLOL SUCCINATE ER 25 MG PO TB24: 25.0000 mg | ORAL_TABLET | Freq: Every day | ORAL | 1 refills | Status: DC

## 2022-04-21 MED ORDER — ATORVASTATIN CALCIUM 40 MG PO TABS: 40.0000 mg | ORAL_TABLET | Freq: Every day | ORAL | 1 refills | Status: DC

## 2022-04-21 NOTE — Progress Notes (Signed)
OFFICE VISIT  04/21/2022  CC:  Chief Complaint  Patient presents with   Follow-up    6 month follow up.    Patient is a 62 y.o. male who presents for 6 mo f/u HTN, HLD, DM. A/P as of last visit: "#1 diabetes, well controlled with diet only. A1c today 5.6% Feet exam today: Normal today. Urine microalbumin/creatinine today.   2.  Hypertension, well controlled on Toprol-XL 25 mg a day and lisinopril 20 mg a day. Electrolytes and creatinine today.   3.  Hypercholesterolemia, doing well on atorvastatin 40 mg a day. Lipid panel and hepatic panel today.   4.  Status post right total hip arthroplasty 02/03/2021. Continue rehab.  He has follow-up with his orthopedist on the 23rd of this month.   5.  Chronic atrial fibrillation. Stable/rate controlled on digoxin 0.125 mg a day and Toprol-XL 25 mg a day.  Stroke prophylaxis with Xarelto 20 mg a day. Electrolytes and CBC today.   6. Health maintenance exam: Reviewed age and gender appropriate health maintenance issues (prudent diet, regular exercise, health risks of tobacco and excessive alcohol, use of seatbelts, fire alarms in home, use of sunscreen).  Also reviewed age and gender appropriate health screening as well as vaccine recommendations. Vaccines: Flu today. Labs: HP, a1c, psa Prostate ca screening: PSA today. Colon ca screening: recall 07/2022"  INTERIM HX: Robert [name] is feeling well. No acute concerns. He is compliant with medications. No home glucose or blood pressure monitoring.  ROS as above, plus--> no fevers, no CP, no SOB, no wheezing, no cough, no dizziness, no HAs, no rashes, no melena/hematochezia.  No polyuria or polydipsia.   No focal weakness, paresthesias, or tremors.  No acute vision or hearing abnormalities.  No dysuria or unusual/new urinary urgency or frequency.  No recent changes in lower legs. No n/v/d or abd pain.  No palpitations.      Past Medical History:  Diagnosis Date   Alcohol abuse    ongoing as  of 07/2017.  Quit after CVA 05/2018   Arthritis    Atrial fibrillation    Not candidate for anticoagulation b/c of GI bleeding; had to get transfused.  Then, pt had CVA 05/2018 and he was put on xarelto and he quit drinking.   Coronary atherosclerosis    Noted on lung cancer screening CT   COVID-19 virus infection 06/2019   CVA (cerebral vascular accident) 05/2018   large R MCA territory infarct.  CTA neg for large cerebral vessel occlusion.  R ICA 70% stenosis, L clear. Rpt CTA neck ordered by neuro as of 01/2019.   Gastric ulcer    hx of--unclear (pt and wife deny this)   Gout    Usually MTP joint   History of adenomatous polyp of colon 2021   recall 2024   History of digital clubbing    "all my adult life"--CXR 08/2016 = bronchitic changes o/w normal.   Hyperkalemia 04/2017   Normalized with decreasing lisinopril from 40 mg qd to 20 mg qd.   Hyperlipidemia 07/2016   Holding off on statin until pt quits drinking.  Back on statin after CVA 05/2018   Hypertension    Nonischemic cardiomyopathy 2015; 2020   2015: "resolved" (w/medical therapy?).  05/2018 EF 35-40%, with TR and MR and ++LAE. 05/2019 EF 50-52%, low normal LV wall motion, grd II DD, mod MR and mod TR, mild pulm HTN.   Nonrheumatic mitral valve regurgitation    Osteoarthritis of right hip  Surgery being planned as of 05/2021 consult with Dr. Charlann Boxerlin   Paralysis    some left side deficit from CVA in 2020   Pre-diabetes    Screening for lung cancer    04/2021 CT showed no lesions/nodules.  Repeat 1 year   Sleep apnea    Tobacco abuse    Quit 2014    Past Surgical History:  Procedure Laterality Date   CARDIAC CATHETERIZATION  2015   Normal coronaries   CARDIOVERSION     DCCV 2015   COLONOSCOPY  08/09/2019   One large adenoma-->recall 3 yrs   TONSILLECTOMY     TOTAL HIP ARTHROPLASTY Right 08/03/2021   Procedure: TOTAL HIP ARTHROPLASTY ANTERIOR APPROACH;  Surgeon: Durene Romanslin, Matthew, MD;  Location: [facility];  Service: Orthopedics;   Laterality: Right;   TRANSTHORACIC ECHOCARDIOGRAM  06/08/2018; 05/2019   05/2018 (in context of acute CVA): EF 35-40%, mild/mod MR, mod/sev TR, severely dilated LA.  05/2019 EF 50-52%, low normal LV wall motion, grd II DD, mod MR and mod TR, mild pulm HTN.   TRANSTHORACIC ECHOCARDIOGRAM     07/06/21 EF 50-55%,normal WM, mild elev RVP, mild MR.    Outpatient Medications Prior to Visit  Medication Sig Dispense Refill   rivaroxaban (XARELTO) 20 MG TABS tablet Take 20 mg by mouth daily with supper.     ondansetron (ZOFRAN) 4 MG tablet Take 1 tablet (4 mg total) by mouth every 6 (six) hours as needed for nausea. (Patient not taking: Reported on 04/21/2022) 20 tablet 0   polyethylene glycol (MIRALAX / GLYCOLAX) 17 g packet Take 17 g by mouth daily as needed for mild constipation. (Patient not taking: Reported on 04/21/2022) 14 each 0   allopurinol (ZYLOPRIM) 100 MG tablet Take 2 tablets (200 mg total) by mouth daily. 180 tablet 1   atorvastatin (LIPITOR) 40 MG tablet Take 1 tablet (40 mg total) by mouth daily. 90 tablet 1   folic acid (FOLVITE) 1 MG tablet Take 1 tablet (1 mg total) by mouth daily. 90 tablet 1   lisinopril (ZESTRIL) 20 MG tablet Take 1 tablet (20 mg total) by mouth daily. 90 tablet 1   metoprolol succinate (TOPROL-XL) 25 MG 24 hr tablet Take 1 tablet (25 mg total) by mouth daily. 30 tablet 0   pantoprazole (PROTONIX) 40 MG tablet Take 1 tablet (40 mg total) by mouth 2 (two) times daily. 180 tablet 1   No facility-administered medications prior to visit.    No Known Allergies  Review of Systems As per HPI  PE:    04/21/2022    2:06 PM 02/14/2022    4:13 PM 10/18/2021    1:16 PM  Vitals with BMI  Height  5' 8.9" 5' 8.9"  Weight 192 lbs 10 oz 186 lbs 184 lbs 6 oz  BMI  27.55 27.31  Systolic 120 135 [id number]  Diastolic 76 70 79  Pulse 75 78 73     Physical Exam  Gen: Alert, well appearing.  Patient is oriented to person, place, time, and situation. AFFECT: pleasant, lucid  thought and speech. CV: Irregularly irregular, rate around 60-65, no murmur. Chest is clear, no wheezing or rales. Normal symmetric air entry throughout both lung fields. No chest wall deformities or tenderness. EXT: no clubbing or cyanosis.  No edema.    LABS:  Last CBC Lab Results  Component Value Date   WBC 6.0 10/18/2021   HGB 13.0 (L) 10/18/2021   HCT 39.9 10/18/2021   MCV 86.6 10/18/2021  MCH 28.2 10/18/2021   RDW 12.8 10/18/2021   PLT 327 10/18/2021   Last metabolic panel Lab Results  Component Value Date   GLUCOSE 92 10/18/2021   NA 141 10/18/2021   K 4.4 10/18/2021   CL 100 10/18/2021   CO2 31 10/18/2021   BUN 17 10/18/2021   CREATININE 0.94 10/18/2021   GFRNONAA >60 08/04/2021   CALCIUM 9.7 10/18/2021   PHOS 2.9 06/13/2018   PROT 7.8 10/18/2021   ALBUMIN 4.6 07/22/2021   BILITOT 0.6 10/18/2021   ALKPHOS 79 07/22/2021   AST 14 10/18/2021   ALT 11 10/18/2021   ANIONGAP 10 08/04/2021   Last lipids Lab Results  Component Value Date   CHOL 152 10/18/2021   HDL 65 10/18/2021   LDLCALC 73 10/18/2021   TRIG 61 10/18/2021   CHOLHDL 2.3 10/18/2021   Last hemoglobin A1c Lab Results  Component Value Date   HGBA1C 6.0 (A) 04/21/2022   HGBA1C 6.0 04/21/2022   HGBA1C 6.0 04/21/2022   HGBA1C 6.0 04/21/2022   Last thyroid functions Lab Results  Component Value Date   TSH 1.20 05/15/2020   Last vitamin B12 and Folate Lab Results  Component Value Date   VITAMINB12 576 06/29/2019   FOLATE 33.7 06/29/2019   POC Hba1c today is 6.0%  IMPRESSION AND PLAN:  #1 diabetes, well controlled with diet only. POC Hba1c is 6.0% today.   2.  Hypertension, well controlled on Toprol-XL 25 mg a day and lisinopril 20 mg a day.   3.  Hypercholesterolemia, doing well on atorvastatin 40 mg a day.  4. Chronic atrial fibrillation. Stable/rate controlled on digoxin 0.125 mg a day and Toprol-XL 25 mg a day.  Stroke prophylaxis with Xarelto 20 mg a day.  An After Visit  Summary was printed and given to the patient.  FOLLOW UP: Return in about 6 months (around 10/21/2022) for annual CPE (fasting).  Signed:  Santiago Bumpers, MD           04/21/2022

## 2022-05-02 ENCOUNTER — Telehealth: Payer: Self-pay | Admitting: Family Medicine

## 2022-05-02 NOTE — Telephone Encounter (Signed)
Copied from CRM [id number]. Topic: Medicare AWV >> May 02, 2022  9:18 AM Gwenith Spitz wrote: Reason for CRM: Called patient to schedule Medicare Annual Wellness Visit (AWV). No voicemail available to leave a message.  Last date of AWV: 04/10/2021  Please schedule an appointment  any Wednesday as Tele/Video visits with Inetta Fermo, [facility]. Please schedule AWVS with Inetta Fermo,   Please schedule as video/tele visit only, Wednesdays only.   If any questions, please contact me at [phone].  Thank you ,  Gabriel Cirri [facility] AWV TEAM Direct Dial [phone]

## 2022-05-09 ENCOUNTER — Other Ambulatory Visit: Payer: Self-pay | Admitting: Family Medicine

## 2022-05-09 NOTE — Telephone Encounter (Signed)
His cardiologist, Dr. Kennon Portela with [facility], prescribed this and has been refilling.  Asked patient if he has sent this refill request to that doctor or not.  Thanks.

## 2022-05-09 NOTE — Telephone Encounter (Signed)
Xarelto added to med list, historical provider. LOV 04/21/22  Please advise if you can refill medication

## 2022-05-09 NOTE — Telephone Encounter (Signed)
Tried calling patient, unable to LVM.  

## 2022-05-11 ENCOUNTER — Other Ambulatory Visit: Payer: Self-pay | Admitting: Family Medicine

## 2022-05-26 ENCOUNTER — Telehealth: Payer: Self-pay | Admitting: Family Medicine

## 2022-05-26 NOTE — Telephone Encounter (Signed)
Copied from CRM [id number]. Topic: Medicare AWV >> May 26, 2022  9:50 AM Gwenith Spitz wrote: Reason for ION:GEXBMW patient to schedule Medicare Annual Wellness Visit (AWV). No voicemail available to leave a message.  Last date of AWV: 04/10/2021  Please schedule an appointment at any time with Please schedule an appointment at any Wednesday with Inetta Fermo [facility]. Please schedule an appointment  any Wednesday as Tele/Video visits with Inetta Fermo, [facility].  If any questions, please contact me at [phone].  Thank you ,  Gabriel Cirri [facility] AWV TEAM Direct Dial [phone]

## 2022-06-26 ENCOUNTER — Other Ambulatory Visit: Payer: Self-pay | Admitting: Family Medicine

## 2022-08-03 ENCOUNTER — Ambulatory Visit (INDEPENDENT_AMBULATORY_CARE_PROVIDER_SITE_OTHER): Payer: Medicare HMO

## 2022-08-03 VITALS — Wt 192.0 lb

## 2022-08-03 DIAGNOSIS — Z0000 Encounter for general adult medical examination without abnormal findings: Secondary | ICD-10-CM

## 2022-08-03 NOTE — Progress Notes (Signed)
Subjective:   Robert [name] is a 62 y.o. male who presents for Medicare Annual/Subsequent preventive examination. Along with wife Silvio Pate   Per patient no change in vitals since last visit; unable to obtain new vitals due to this being a telehealth visit. 08/03/22 Patient was unable to self-report vital signs via telehealth due to a lack of equipment at home.   Visit Complete: Virtual  I connected with  Gretchen Short on 08/03/22 by a audio enabled telemedicine application and verified that I am speaking with the correct person using two identifiers.  Patient Location: Home  Provider Location: Home Office  I discussed the limitations of evaluation and management by telemedicine. The patient expressed understanding and agreed to proceed.   Review of Systems     Cardiac Risk Factors include: advanced age (>81men, >20 women);hypertension;diabetes mellitus;male gender;obesity (BMI >30kg/m2)     Objective:    Today's Vitals   08/03/22 1500  Weight: 192 lb (87.1 kg)   Body mass index is 28.44 kg/m.     08/03/2022    3:04 PM 08/03/2021    6:27 PM 07/22/2021   11:29 AM 04/10/2021   10:12 AM 06/20/2018    4:20 PM 06/06/2018    8:09 PM  Advanced Directives  Does Patient Have a Medical Advance Directive? No No No No  No  Would patient like information on creating a medical advance directive? No - Patient declined No - Patient declined No - Patient declined Yes (MAU/Ambulatory/Procedural Areas - Information given) No - Patient declined     Current Medications (verified) Outpatient Encounter Medications as of 08/03/2022  Medication Sig   allopurinol (ZYLOPRIM) 100 MG tablet Take 2 tablets (200 mg total) by mouth daily.   atorvastatin (LIPITOR) 40 MG tablet Take 1 tablet (40 mg total) by mouth daily.   digoxin (LANOXIN) 0.125 MG tablet Take by mouth every other day.   folic acid (FOLVITE) 1 MG tablet Take 1 tablet (1 mg total) by mouth daily.   lisinopril (ZESTRIL) 20 MG tablet TAKE 1  TABLET BY MOUTH DAILY   metoprolol succinate (TOPROL-XL) 25 MG 24 hr tablet TAKE 1 TABLET BY MOUTH DAILY   pantoprazole (PROTONIX) 40 MG tablet Take 1 tablet (40 mg total) by mouth 2 (two) times daily.   polyethylene glycol (MIRALAX / GLYCOLAX) 17 g packet Take 17 g by mouth daily as needed for mild constipation.   XARELTO 20 MG TABS tablet TAKE 1 TABLET BY MOUTH DAILY WITH SUPPER   [DISCONTINUED] ondansetron (ZOFRAN) 4 MG tablet Take 1 tablet (4 mg total) by mouth every 6 (six) hours as needed for nausea. (Patient not taking: Reported on 04/21/2022)   No facility-administered encounter medications on file as of 08/03/2022.    Allergies (verified) Patient has no known allergies.   History: Past Medical History:  Diagnosis Date   Alcohol abuse    ongoing as of 07/2017.  Quit after CVA 05/2018   Arthritis    Atrial fibrillation (HCC)    Not candidate for anticoagulation b/c of GI bleeding; had to get transfused.  Then, pt had CVA 05/2018 and he was put on xarelto and he quit drinking.   Coronary atherosclerosis    Noted on lung cancer screening CT   COVID-19 virus infection 06/2019   CVA (cerebral vascular accident) (HCC) 05/2018   large R MCA territory infarct.  CTA neg for large cerebral vessel occlusion.  R ICA 70% stenosis, L clear. Rpt CTA neck ordered by neuro as of 01/2019.  Gastric ulcer    hx of--unclear (pt and wife deny this)   Gout    Usually MTP joint   History of adenomatous polyp of colon 2021   recall 2024   History of digital clubbing    "all my adult life"--CXR 08/2016 = bronchitic changes o/w normal.   Hyperkalemia 04/2017   Normalized with decreasing lisinopril from 40 mg qd to 20 mg qd.   Hyperlipidemia 07/2016   Holding off on statin until pt quits drinking.  Back on statin after CVA 05/2018   Hypertension    Nonischemic cardiomyopathy (HCC) 2015; 2020   2015: "resolved" (w/medical therapy?).  05/2018 EF 35-40%, with TR and MR and ++LAE. 05/2019 EF 50-52%, low  normal LV wall motion, grd II DD, mod MR and mod TR, mild pulm HTN.   Nonrheumatic mitral valve regurgitation    Osteoarthritis of right hip    Surgery being planned as of 05/2021 consult with Dr. Charlann Boxer   Paralysis [facility])    some left side deficit from CVA in 2020   Pre-diabetes    Screening for lung cancer    04/2021 CT showed no lesions/nodules.  Repeat 1 year   Sleep apnea    Tobacco abuse    Quit 2014   Past Surgical History:  Procedure Laterality Date   CARDIAC CATHETERIZATION  2015   Normal coronaries   CARDIOVERSION     DCCV 2015   COLONOSCOPY  08/09/2019   One large adenoma-->recall 3 yrs   TONSILLECTOMY     TOTAL HIP ARTHROPLASTY Right 08/03/2021   Procedure: TOTAL HIP ARTHROPLASTY ANTERIOR APPROACH;  Surgeon: Durene Romans, MD;  Location: [facility];  Service: Orthopedics;  Laterality: Right;   TRANSTHORACIC ECHOCARDIOGRAM  06/08/2018; 05/2019   05/2018 (in context of acute CVA): EF 35-40%, mild/mod MR, mod/sev TR, severely dilated LA.  05/2019 EF 50-52%, low normal LV wall motion, grd II DD, mod MR and mod TR, mild pulm HTN.   TRANSTHORACIC ECHOCARDIOGRAM     07/06/21 EF 50-55%,normal WM, mild elev RVP, mild MR.   Family History  Problem Relation Age of Onset   Diabetes Mother    Congestive Heart Failure Mother    Alcohol abuse Father    Hypertension Father    Stroke Father    Heart disease Paternal Grandfather    Social History   Socioeconomic History   Marital status: Married    Spouse name: Not on file   Number of children: Not on file   Years of education: Not on file   Highest education level: Not on file  Occupational History   Not on file  Tobacco Use   Smoking status: Former    Current packs/day: 0.00    Average packs/day: 1 pack/day for 5.0 years (5.0 ttl pk-yrs)    Types: Cigarettes    Start date: 01/11/2008    Quit date: 01/10/2013    Years since quitting: 9.5   Smokeless tobacco: Never  Vaping Use   Vaping status: Former  Substance and Sexual Activity    Alcohol use: Not Currently    Comment: drinks liquor daily   Drug use: No   Sexual activity: Not on file  Other Topics Concern   Not on file  Social History Narrative   Married, 2 adult children.   Educ: 11th grade   Occup: "out of work".  Worked for [employer]--was let go for not abiding by [employer] per pt/wife.   Tob: former--quit 2014.  "Of and on prior".  Alc: long hx of drinking a pint and 1 and 1/2 pint of whiskey per day x years---quit approx May 2020.Marland Kitchen   Drugs: none.   Social Determinants of Health   Financial Resource Strain: Low Risk  (08/03/2022)   Overall Financial Resource Strain (CARDIA)    Difficulty of Paying Living Expenses: Not hard at all  Food Insecurity: No Food Insecurity (08/03/2022)   Hunger Vital Sign    Worried About Running Out of Food in the Last Year: Never true    Ran Out of Food in the Last Year: Never true  Transportation Needs: No Transportation Needs (08/03/2022)   PRAPARE - Administrator, Civil Service (Medical): No    Lack of Transportation (Non-Medical): No  Physical Activity: Insufficiently Active (08/03/2022)   Exercise Vital Sign    Days of Exercise per Week: 2 days    Minutes of Exercise per Session: 60 min  Stress: No Stress Concern Present (08/03/2022)   [employer] of Occupational Health - Occupational Stress Questionnaire    Feeling of Stress : Not at all  Social Connections: Moderately Isolated (08/03/2022)   Social Connection and Isolation Panel [NHANES]    Frequency of Communication with Friends and Family: Three times a week    Frequency of Social Gatherings with Friends and Family: Twice a week    Attends Religious Services: Never    Database administrator or Organizations: No    Attends Engineer, structural: Never    Marital Status: Married    Tobacco Counseling Counseling given: Not Answered   Clinical Intake:  Pre-visit preparation completed: Yes  Pain : No/denies pain      BMI - recorded: 28.44 Nutritional Status: BMI 25 -29 Overweight Nutritional Risks: None Diabetes: Yes CBG done?: No Did pt. bring in CBG monitor from home?: No  How often do you need to have someone help you when you read instructions, pamphlets, or other written materials from your doctor or pharmacy?: 1 - Never  Interpreter Needed?: No  Information entered by :: Lanier Ensign, LPN   Activities of Daily Living    08/03/2022    3:01 PM  In your present state of health, do you have any difficulty performing the following activities:  Hearing? 0  Vision? 0  Difficulty concentrating or making decisions? 0  Walking or climbing stairs? 1  Comment use cane  Dressing or bathing? 0  Doing errands, shopping? 0  Preparing Food and eating ? N  Using the Toilet? N  In the past six months, have you accidently leaked urine? N  Do you have problems with loss of bowel control? N  Managing your Medications? N  Managing your Finances? N  Housekeeping or managing your Housekeeping? N    Patient Care Team: Jeoffrey Massed, MD as PCP - General (Family Medicine) Powers, Everardo All, MD as Consulting Physician (Cardiology) Zachery Dakins, MD as Consulting Physician (Gastroenterology) Wille Glaser, MD as Consulting Physician (Cardiology) Raynald Blend, OD (Optometry) Durene Romans, MD as Consulting Physician (Orthopedic Surgery)  Indicate any recent Medical Services you may have received from other than [facility] providers in the past year (date may be approximate).     Assessment:   This is a routine wellness examination for Ferrer Comunidad.  Hearing/Vision screen Hearing Screening - Comments:: Pt denies any hearing issue Vision Screening - Comments:: Pt follows up with eye care center   Dietary issues and exercise activities discussed:     Goals Addressed  This Visit's Progress    Patient Stated       None at this time        Depression Screen    08/03/2022     3:05 PM 02/14/2022    4:15 PM 10/01/2021    2:36 PM 04/19/2021   11:05 AM 04/10/2021   10:09 AM 05/15/2020    9:39 AM 09/26/2019    9:08 AM  PHQ 2/9 Scores  PHQ - 2 Score 0 0 0 5 0 0 0    Fall Risk    08/03/2022    3:08 PM 04/19/2021   11:05 AM 04/10/2021   10:13 AM  Fall Risk   Falls in the past year? 0 0 0  Number falls in past yr: 0 0 0  Injury with Fall? 0 0 0  Risk for fall due to : Impaired balance/gait;Impaired vision;Impaired mobility History of fall(s);Impaired mobility Impaired mobility;History of fall(s)  Follow up Falls prevention discussed Falls evaluation completed Falls prevention discussed;Falls evaluation completed    MEDICARE RISK AT HOME:  Medicare Risk at Home - 08/03/22 1508     Any stairs in or around the home? Yes    If so, are there any without handrails? No    Home free of loose throw rugs in walkways, pet beds, electrical cords, etc? Yes    Adequate lighting in your home to reduce risk of falls? Yes    Life alert? No    Use of a cane, walker or w/c? Yes    Grab bars in the bathroom? Yes    Shower chair or bench in shower? Yes    Elevated toilet seat or a handicapped toilet? No             TIMED UP AND GO:  Was the test performed?  No    Cognitive Function:        08/03/2022    3:09 PM 04/10/2021   10:17 AM  6CIT Screen  What Year? 0 points 0 points  What month? 0 points 0 points  What time? 0 points 0 points  Count back from 20 0 points 0 points  Months in reverse 4 points 4 points  Repeat phrase 0 points 0 points  Total Score 4 points 4 points    Immunizations Immunization History  Administered Date(s) Administered   Influenza,inj,Quad PF,6+ Mos 03/20/2017, 11/06/2017, 11/09/2018, 09/26/2019, 11/20/2020, 10/18/2021   PNEUMOCOCCAL CONJUGATE-20 05/15/2020   Pneumococcal Polysaccharide-23 07/31/2017   Rsv, Bivalent, Protein Subunit Rsvpref,pf Verdis Frederickson) 11/08/2021   Tdap 11/06/2017   Zoster Recombinant(Shingrix) 01/20/2017, 03/20/2017      TDAP status: Up to date  Flu Vaccine status: Up to date    Covid-19 vaccine status: Declined, Education has been provided regarding the importance of this vaccine but patient still declined. Advised may receive this vaccine at local pharmacy or Health Dept.or vaccine clinic. Aware to provide a copy of the vaccination record if obtained from local pharmacy or Health Dept. Verbalized acceptance and understanding.  Qualifies for Shingles Vaccine? Yes   Zostavax completed Yes   Shingrix Completed?: Yes  Screening Tests Health Maintenance  Topic Date Due   OPHTHALMOLOGY EXAM  Never done   COVID-19 Vaccine (1) 08/19/2022 (Originally 12/23/1965)   INFLUENZA VACCINE  08/11/2022   Diabetic kidney evaluation - eGFR measurement  10/19/2022   Diabetic kidney evaluation - Urine ACR  10/19/2022   FOOT EXAM  10/19/2022   HEMOGLOBIN A1C  10/21/2022   Medicare Annual Wellness (AWV)  08/03/2023  DTaP/Tdap/Td (2 - Td or Tdap) 11/07/2027   Colonoscopy  08/08/2029   Hepatitis C Screening  Completed   HIV Screening  Completed   Zoster Vaccines- Shingrix  Completed   HPV VACCINES  Aged Out    Health Maintenance  Health Maintenance Due  Topic Date Due   OPHTHALMOLOGY EXAM  Never done    Colorectal cancer screening: Type of screening: Colonoscopy. Completed 08/09/19. Repeat every 10 years   Additional Screening:  Hepatitis C Screening:  Completed 05/09/19  Vision Screening: Recommended annual ophthalmology exams for early detection of glaucoma and other disorders of the eye. Is the patient up to date with their annual eye exam?  No  Who is the provider or what is the name of the office in which the patient attends annual eye exams? Eye center  If pt is not established with a provider, would they like to be referred to a provider to establish care? No .   Dental Screening: Recommended annual dental exams for proper oral hygiene  Diabetic Foot Exam: Diabetic Foot Exam: Completed  11/07/21  Community Resource Referral / Chronic Care Management: CRR required this visit?  No   CCM required this visit?  No     Plan:     I have personally reviewed and noted the following in the patient's chart:   Medical and social history Use of alcohol, tobacco or illicit drugs  Current medications and supplements including opioid prescriptions. Patient is not currently taking opioid prescriptions. Functional ability and status Nutritional status Physical activity Advanced directives List of other physicians Hospitalizations, surgeries, and ER visits in previous 12 months Vitals Screenings to include cognitive, depression, and falls Referrals and appointments  In addition, I have reviewed and discussed with patient certain preventive protocols, quality metrics, and best practice recommendations. A written personalized care plan for preventive services as well as general preventive health recommendations were provided to patient.     Marzella Schlein, LPN   [id number]/61/0960   After Visit Summary: (MyChart) Due to this being a telephonic visit, the after visit summary with patients personalized plan was offered to patient via MyChart   Nurse Notes: none

## 2022-08-03 NOTE — Patient Instructions (Signed)
Robert [name] , Thank you for taking time to come for your Medicare Wellness Visit. I appreciate your ongoing commitment to your health goals. Please review the following plan we discussed and let me know if I can assist you in the future.   These are the goals we discussed:  Goals       Maintain Mobility and Function (pt-stated)      Evidence-based guidance:  Emphasize the importance of physical activity and aerobic exercise as included in treatment plan; assess barriers to adherence; consider patient's abilities and preferences.  Encourage gradual increase in activity or exercise instead of stopping if pain occurs.  Reinforce individual therapy exercise prescription, such as strengthening, stabilization and stretching programs.  Promote optimal body mechanics to stabilize the spine with lifting and functional activity.  Encourage activity and mobility modifications to facilitate optimal function, such as using a log roll for bed mobility or dressing from a seated position.  Reinforce individual adaptive equipment recommendations to limit excessive spinal movements, such as a Event organiser.  Assess adequacy of sleep; encourage use of sleep hygiene techniques, such as bedtime routine; use of white noise; dark, cool bedroom; avoiding daytime naps, heavy meals or exercise before bedtime.  Promote positions and modification to optimize sleep and sexual activity; consider pillows or positioning devices to assist in maintaining neutral spine.  Explore options for applying ergonomic principles at work and home, such as frequent position changes, using ergonomically designed equipment and working at optimal height.  Promote modifications to increase comfort with driving such as lumbar support, optimizing seat and steering wheel position, using cruise control and taking frequent rest stops to stretch and walk.   Notes:       Patient Stated      None at this time         This is a list of the  screening recommended for you and due dates:  Health Maintenance  Topic Date Due   COVID-19 Vaccine (1) Never done   Eye exam for diabetics  Never done   Flu Shot  08/11/2022   Yearly kidney function blood test for diabetes  10/19/2022   Yearly kidney health urinalysis for diabetes  10/19/2022   Complete foot exam   10/19/2022   Hemoglobin A1C  10/21/2022   Medicare Annual Wellness Visit  08/03/2023   DTaP/Tdap/Td vaccine (2 - Td or Tdap) 11/07/2027   Colon Cancer Screening  08/08/2029   Hepatitis C Screening  Completed   HIV Screening  Completed   Zoster (Shingles) Vaccine  Completed   HPV Vaccine  Aged Out    Advanced directives: Advance directive discussed with you today. Even though you declined this today please call our office should you change your mind and we can give you the proper paperwork for you to fill out.  Conditions/risks identified: none at this time   Next appointment: Follow up in one year for your annual wellness visit   Preventive Care 40-64 Years, Male Preventive care refers to lifestyle choices and visits with your health care provider that can promote health and wellness. What does preventive care include? A yearly physical exam. This is also called an annual well check. Dental exams once or twice a year. Routine eye exams. Ask your health care provider how often you should have your eyes checked. Personal lifestyle choices, including: Daily care of your teeth and gums. Regular physical activity. Eating a healthy diet. Avoiding tobacco and drug use. Limiting alcohol use. Practicing safe sex. Taking low-dose  aspirin every day starting at age 57. What happens during an annual well check? The services and screenings done by your health care provider during your annual well check will depend on your age, overall health, lifestyle risk factors, and family history of disease. Counseling  Your health care provider may ask you questions about your: Alcohol  use. Tobacco use. Drug use. Emotional well-being. Home and relationship well-being. Sexual activity. Eating habits. Work and work Astronomer. Screening  You may have the following tests or measurements: Height, weight, and BMI. Blood pressure. Lipid and cholesterol levels. These may be checked every 5 years, or more frequently if you are over 26 years old. Skin check. Lung cancer screening. You may have this screening every year starting at age 56 if you have a 30-pack-year history of smoking and currently smoke or have quit within the past 15 years. Fecal occult blood test (FOBT) of the stool. You may have this test every year starting at age 61. Flexible sigmoidoscopy or colonoscopy. You may have a sigmoidoscopy every 5 years or a colonoscopy every 10 years starting at age 31. Prostate cancer screening. Recommendations will vary depending on your family history and other risks. Hepatitis C blood test. Hepatitis B blood test. Sexually transmitted disease (STD) testing. Diabetes screening. This is done by checking your blood sugar (glucose) after you have not eaten for a while (fasting). You may have this done every 1-3 years. Discuss your test results, treatment options, and if necessary, the need for more tests with your health care provider. Vaccines  Your health care provider may recommend certain vaccines, such as: Influenza vaccine. This is recommended every year. Tetanus, diphtheria, and acellular pertussis (Tdap, Td) vaccine. You may need a Td booster every 10 years. Zoster vaccine. You may need this after age 38. Pneumococcal 13-valent conjugate (PCV13) vaccine. You may need this if you have certain conditions and have not been vaccinated. Pneumococcal polysaccharide (PPSV23) vaccine. You may need one or two doses if you smoke cigarettes or if you have certain conditions. Talk to your health care provider about which screenings and vaccines you need and how often you need  them. This information is not intended to replace advice given to you by your health care provider. Make sure you discuss any questions you have with your health care provider. Document Released: 01/23/2015 Document Revised: 09/16/2015 Document Reviewed: 10/28/2014 Elsevier Interactive Patient Education  2017 [employer].  Fall Prevention in the Home Falls can cause injuries. They can happen to people of all ages. There are many things you can do to make your home safe and to help prevent falls. What can I do on the outside of my home? Regularly fix the edges of walkways and driveways and fix any cracks. Remove anything that might make you trip as you walk through a door, such as a raised step or threshold. Trim any bushes or trees on the path to your home. Use bright outdoor lighting. Clear any walking paths of anything that might make someone trip, such as rocks or tools. Regularly check to see if handrails are loose or broken. Make sure that both sides of any steps have handrails. Any raised decks and porches should have guardrails on the edges. Have any leaves, snow, or ice cleared regularly. Use sand or salt on walking paths during winter. Clean up any spills in your garage right away. This includes oil or grease spills. What can I do in the bathroom? Use night lights. Install grab bars by  the toilet and in the tub and shower. Do not use towel bars as grab bars. Use non-skid mats or decals in the tub or shower. If you need to sit down in the shower, use a plastic, non-slip stool. Keep the floor dry. Clean up any water that spills on the floor as soon as it happens. Remove soap buildup in the tub or shower regularly. Attach bath mats securely with double-sided non-slip rug tape. Do not have throw rugs and other things on the floor that can make you trip. What can I do in the bedroom? Use night lights. Make sure that you have a light by your bed that is easy to reach. Do not use  any sheets or blankets that are too big for your bed. They should not hang down onto the floor. Have a firm chair that has side arms. You can use this for support while you get dressed. Do not have throw rugs and other things on the floor that can make you trip. What can I do in the kitchen? Clean up any spills right away. Avoid walking on wet floors. Keep items that you use a lot in easy-to-reach places. If you need to reach something above you, use a strong step stool that has a grab bar. Keep electrical cords out of the way. Do not use floor polish or wax that makes floors slippery. If you must use wax, use non-skid floor wax. Do not have throw rugs and other things on the floor that can make you trip. What can I do with my stairs? Do not leave any items on the stairs. Make sure that there are handrails on both sides of the stairs and use them. Fix handrails that are broken or loose. Make sure that handrails are as long as the stairways. Check any carpeting to make sure that it is firmly attached to the stairs. Fix any carpet that is loose or worn. Avoid having throw rugs at the top or bottom of the stairs. If you do have throw rugs, attach them to the floor with carpet tape. Make sure that you have a light switch at the top of the stairs and the bottom of the stairs. If you do not have them, ask someone to add them for you. What else can I do to help prevent falls? Wear shoes that: Do not have high heels. Have rubber bottoms. Are comfortable and fit you well. Are closed at the toe. Do not wear sandals. If you use a stepladder: Make sure that it is fully opened. Do not climb a closed stepladder. Make sure that both sides of the stepladder are locked into place. Ask someone to hold it for you, if possible. Clearly mark and make sure that you can see: Any grab bars or handrails. First and last steps. Where the edge of each step is. Use tools that help you move around (mobility aids)  if they are needed. These include: Canes. Walkers. Scooters. Crutches. Turn on the lights when you go into a dark area. Replace any light bulbs as soon as they burn out. Set up your furniture so you have a clear path. Avoid moving your furniture around. If any of your floors are uneven, fix them. If there are any pets around you, be aware of where they are. Review your medicines with your doctor. Some medicines can make you feel dizzy. This can increase your chance of falling. Ask your doctor what other things that you can do to help  prevent falls. This information is not intended to replace advice given to you by your health care provider. Make sure you discuss any questions you have with your health care provider. Document Released: 10/23/2008 Document Revised: 06/04/2015 Document Reviewed: 01/31/2014 Elsevier Interactive Patient Education  2017 [employer].

## 2022-08-10 ENCOUNTER — Other Ambulatory Visit: Payer: Self-pay | Admitting: Family Medicine

## 2022-08-11 DIAGNOSIS — H5203 Hypermetropia, bilateral: Secondary | ICD-10-CM | POA: Diagnosis not present

## 2022-08-11 DIAGNOSIS — H2513 Age-related nuclear cataract, bilateral: Secondary | ICD-10-CM | POA: Diagnosis not present

## 2022-08-11 DIAGNOSIS — H52223 Regular astigmatism, bilateral: Secondary | ICD-10-CM | POA: Diagnosis not present

## 2022-08-11 DIAGNOSIS — H524 Presbyopia: Secondary | ICD-10-CM | POA: Diagnosis not present

## 2022-09-08 DIAGNOSIS — I10 Essential (primary) hypertension: Secondary | ICD-10-CM | POA: Diagnosis not present

## 2022-09-08 DIAGNOSIS — I4892 Unspecified atrial flutter: Secondary | ICD-10-CM | POA: Diagnosis not present

## 2022-09-08 DIAGNOSIS — I4891 Unspecified atrial fibrillation: Secondary | ICD-10-CM | POA: Diagnosis not present

## 2022-10-05 DIAGNOSIS — Z09 Encounter for follow-up examination after completed treatment for conditions other than malignant neoplasm: Secondary | ICD-10-CM | POA: Diagnosis not present

## 2022-10-05 DIAGNOSIS — Z86010 Personal history of colonic polyps: Secondary | ICD-10-CM | POA: Diagnosis not present

## 2022-10-05 DIAGNOSIS — D123 Benign neoplasm of transverse colon: Secondary | ICD-10-CM | POA: Diagnosis not present

## 2022-10-05 DIAGNOSIS — Z1211 Encounter for screening for malignant neoplasm of colon: Secondary | ICD-10-CM | POA: Diagnosis not present

## 2022-10-05 DIAGNOSIS — I10 Essential (primary) hypertension: Secondary | ICD-10-CM | POA: Diagnosis not present

## 2022-10-17 ENCOUNTER — Other Ambulatory Visit: Payer: Self-pay | Admitting: Family Medicine

## 2022-10-18 NOTE — Telephone Encounter (Signed)
Medication was discontinued by provider in February.

## 2022-10-21 ENCOUNTER — Telehealth: Payer: Self-pay | Admitting: Family Medicine

## 2022-10-21 ENCOUNTER — Encounter: Payer: Self-pay | Admitting: Family Medicine

## 2022-10-21 ENCOUNTER — Encounter: Payer: Medicare HMO | Admitting: Family Medicine

## 2022-10-21 NOTE — Progress Notes (Deleted)
Office Note 10/21/2022  CC: No chief complaint on file.  Patient is a 62 y.o. male who is here for annual health maintenance exam and 69-month follow-up diabetes, hypertension, and chronic A-fib. A/P as of last visit: "1 diabetes, well controlled with diet only. POC Hba1c is 6.0% today.    2.  Hypertension, well controlled on Toprol-XL 25 mg a day and lisinopril 20 mg a day.   3.  Hypercholesterolemia, doing well on atorvastatin 40 mg a day.   4. Chronic atrial fibrillation. Stable/rate controlled on digoxin 0.125 mg a day and Toprol-XL 25 mg a day.  Stroke prophylaxis with Xarelto 20 mg a day."  INTERIM HX: ***   Past Medical History:  Diagnosis Date   Alcohol abuse    ongoing as of 07/2017.  Quit after CVA 05/2018   Arthritis    Atrial fibrillation (HCC)    Not candidate for anticoagulation b/c of GI bleeding; had to get transfused.  Then, pt had CVA 05/2018 and he was put on xarelto and he quit drinking.   Coronary atherosclerosis    Noted on lung cancer screening CT   COVID-19 virus infection 06/2019   CVA (cerebral vascular accident) (HCC) 05/2018   large R MCA territory infarct.  CTA neg for large cerebral vessel occlusion.  R ICA 70% stenosis, L clear. Rpt CTA neck ordered by neuro as of 01/2019.   Gastric ulcer    hx of--unclear (pt and wife deny this)   Gout    Usually MTP joint   History of adenomatous polyp of colon 2021   recall 2024   History of digital clubbing    "all my adult life"--CXR 08/2016 = bronchitic changes o/w normal.   Hyperkalemia 04/2017   Normalized with decreasing lisinopril from 40 mg qd to 20 mg qd.   Hyperlipidemia 07/2016   Holding off on statin until pt quits drinking.  Back on statin after CVA 05/2018   Hypertension    Nonischemic cardiomyopathy (HCC) 2015; 2020   2015: "resolved" (w/medical therapy?).  05/2018 EF 35-40%, with TR and MR and ++LAE. 05/2019 EF 50-52%, low normal LV wall motion, grd II DD, mod MR and mod TR, mild pulm HTN.    Nonrheumatic mitral valve regurgitation    Osteoarthritis of right hip    Surgery being planned as of 05/2021 consult with Dr. Charlann Boxer   Paralysis [facility])    some left side deficit from CVA in 2020   Pre-diabetes    Screening for lung cancer    04/2021 CT showed no lesions/nodules.  Repeat 1 year   Sleep apnea    Tobacco abuse    Quit 2014    Past Surgical History:  Procedure Laterality Date   CARDIAC CATHETERIZATION  2015   Normal coronaries   CARDIOVERSION     DCCV 2015   COLONOSCOPY  08/09/2019   One large adenoma-->recall 3 yrs   TONSILLECTOMY     TOTAL HIP ARTHROPLASTY Right 08/03/2021   Procedure: TOTAL HIP ARTHROPLASTY ANTERIOR APPROACH;  Surgeon: Durene Romans, MD;  Location: [facility];  Service: Orthopedics;  Laterality: Right;   TRANSTHORACIC ECHOCARDIOGRAM  06/08/2018; 05/2019   05/2018 (in context of acute CVA): EF 35-40%, mild/mod MR, mod/sev TR, severely dilated LA.  05/2019 EF 50-52%, low normal LV wall motion, grd II DD, mod MR and mod TR, mild pulm HTN.   TRANSTHORACIC ECHOCARDIOGRAM     07/06/21 EF 50-55%,normal WM, mild elev RVP, mild MR.    Family History  Problem  Relation Age of Onset   Diabetes Mother    Congestive Heart Failure Mother    Alcohol abuse Father    Hypertension Father    Stroke Father    Heart disease Paternal Grandfather     Social History   Socioeconomic History   Marital status: Married    Spouse name: Not on file   Number of children: Not on file   Years of education: Not on file   Highest education level: Not on file  Occupational History   Not on file  Tobacco Use   Smoking status: Former    Current packs/day: 0.00    Average packs/day: 1 pack/day for 5.0 years (5.0 ttl pk-yrs)    Types: Cigarettes    Start date: 01/11/2008    Quit date: 01/10/2013    Years since quitting: 9.7   Smokeless tobacco: Never  Vaping Use   Vaping status: Former  Substance and Sexual Activity   Alcohol use: Not Currently    Comment: drinks liquor  daily   Drug use: No   Sexual activity: Not on file  Other Topics Concern   Not on file  Social History Narrative   Married, 2 adult children.   Educ: 11th grade   Occup: "out of work".  Worked for [employer]--was let go for not abiding by [employer] per pt/wife.   Tob: former--quit 2014.  "Of and on prior".   Alc: long hx of drinking a pint and 1 and 1/2 pint of whiskey per day x years---quit approx May 2020.Marland Kitchen   Drugs: none.   Social Determinants of Health   Financial Resource Strain: Low Risk  (08/03/2022)   Overall Financial Resource Strain (CARDIA)    Difficulty of Paying Living Expenses: Not hard at all  Food Insecurity: No Food Insecurity (08/03/2022)   Hunger Vital Sign    Worried About Running Out of Food in the Last Year: Never true    Ran Out of Food in the Last Year: Never true  Transportation Needs: No Transportation Needs (08/03/2022)   PRAPARE - Administrator, Civil Service (Medical): No    Lack of Transportation (Non-Medical): No  Physical Activity: Insufficiently Active (08/03/2022)   Exercise Vital Sign    Days of Exercise per Week: 2 days    Minutes of Exercise per Session: 60 min  Stress: No Stress Concern Present (08/03/2022)   [employer] of Occupational Health - Occupational Stress Questionnaire    Feeling of Stress : Not at all  Social Connections: Moderately Isolated (08/03/2022)   Social Connection and Isolation Panel [NHANES]    Frequency of Communication with Friends and Family: Three times a week    Frequency of Social Gatherings with Friends and Family: Twice a week    Attends Religious Services: Never    Database administrator or Organizations: No    Attends Banker Meetings: Never    Marital Status: Married  Catering manager Violence: Not At Risk (08/03/2022)   Humiliation, Afraid, Rape, and Kick questionnaire    Fear of Current or Ex-Partner: No    Emotionally Abused: No    Physically Abused: No    Sexually  Abused: No    Outpatient Medications Prior to Visit  Medication Sig Dispense Refill   allopurinol (ZYLOPRIM) 100 MG tablet Take 2 tablets (200 mg total) by mouth daily. 180 tablet 1   atorvastatin (LIPITOR) 40 MG tablet TAKE 1 TABLET BY MOUTH DAILY 90 tablet 1   digoxin (  LANOXIN) 0.125 MG tablet Take by mouth every other day.     folic acid (FOLVITE) 1 MG tablet Take 1 tablet (1 mg total) by mouth daily. 90 tablet 1   lisinopril (ZESTRIL) 20 MG tablet TAKE 1 TABLET BY MOUTH DAILY 90 tablet 1   metoprolol succinate (TOPROL-XL) 25 MG 24 hr tablet TAKE 1 TABLET BY MOUTH DAILY 90 tablet 1   pantoprazole (PROTONIX) 40 MG tablet Take 1 tablet (40 mg total) by mouth 2 (two) times daily. 180 tablet 1   polyethylene glycol (MIRALAX / GLYCOLAX) 17 g packet Take 17 g by mouth daily as needed for mild constipation. 14 each 0   XARELTO 20 MG TABS tablet TAKE 1 TABLET BY MOUTH DAILY WITH SUPPER 30 tablet 5   No facility-administered medications prior to visit.    No Known Allergies  Review of Systems *** PE;    08/03/2022    3:00 PM 04/21/2022    2:06 PM 02/14/2022    4:13 PM  Vitals with BMI  Height   5' 8.9"  Weight 192 lbs 192 lbs 10 oz 186 lbs  BMI   27.55  Systolic  120 135  Diastolic  76 70  Pulse  75 78     *** Pertinent labs:  Lab Results  Component Value Date   TSH 1.20 05/15/2020   Lab Results  Component Value Date   WBC 6.0 10/18/2021   HGB 13.0 (L) 10/18/2021   HCT 39.9 10/18/2021   MCV 86.6 10/18/2021   PLT 327 10/18/2021   Lab Results  Component Value Date   CREATININE 0.94 10/18/2021   BUN 17 10/18/2021   NA 141 10/18/2021   K 4.4 10/18/2021   CL 100 10/18/2021   CO2 31 10/18/2021   Lab Results  Component Value Date   ALT 11 10/18/2021   AST 14 10/18/2021   ALKPHOS 79 07/22/2021   BILITOT 0.6 10/18/2021   Lab Results  Component Value Date   CHOL 152 10/18/2021   Lab Results  Component Value Date   HDL 65 10/18/2021   Lab Results  Component  Value Date   LDLCALC 73 10/18/2021   Lab Results  Component Value Date   TRIG 61 10/18/2021   Lab Results  Component Value Date   CHOLHDL 2.3 10/18/2021   Lab Results  Component Value Date   PSA 2.99 10/18/2021   PSA 2.09 05/15/2020   PSA 1.70 05/09/2019   Lab Results  Component Value Date   HGBA1C 6.0 (A) 04/21/2022   HGBA1C 6.0 04/21/2022   HGBA1C 6.0 04/21/2022   HGBA1C 6.0 04/21/2022   ASSESSMENT AND PLAN:   No problem-specific Assessment & Plan notes found for this encounter.  Health maintenance exam: Reviewed age and gender appropriate health maintenance issues (prudent diet, regular exercise, health risks of tobacco and excessive alcohol, use of seatbelts, fire alarms in home, use of sunscreen).  Also reviewed age and gender appropriate health screening as well as vaccine recommendations. Vaccines: Flu today***. Labs: HP, a1c, psa Prostate ca screening: PSA today. Colon ca screening: Most recent colonoscopy was 10/05/2022, Dr. Yevonne Pax, result not available in EMR.  Per pt report--->***.  An After Visit Summary was printed and given to the patient.  FOLLOW UP:  No follow-ups on file.  Signed:  Santiago Bumpers, MD           10/21/2022

## 2022-10-21 NOTE — Telephone Encounter (Signed)
Robert [name] is still supposed to be taking digoxin. He has been getting this prescription through his cardiologist with [facility] in [place]. Dr. Houston Siren.  His last f/u there was 09/08/22. [name] should call their office to request refill.

## 2022-10-24 ENCOUNTER — Encounter: Payer: Medicare HMO | Admitting: Family Medicine

## 2022-10-24 NOTE — Telephone Encounter (Signed)
Pt's wife, Velna Hatchet advised of medication recommendations.

## 2022-11-05 ENCOUNTER — Other Ambulatory Visit: Payer: Self-pay | Admitting: Family Medicine

## 2022-11-06 ENCOUNTER — Other Ambulatory Visit: Payer: Self-pay | Admitting: Family Medicine

## 2022-11-07 ENCOUNTER — Encounter: Payer: Self-pay | Admitting: Family Medicine

## 2022-11-07 ENCOUNTER — Ambulatory Visit (INDEPENDENT_AMBULATORY_CARE_PROVIDER_SITE_OTHER): Payer: Medicare HMO | Admitting: Family Medicine

## 2022-11-07 VITALS — BP 134/75 | HR 55 | Ht 67.0 in | Wt 191.4 lb

## 2022-11-07 DIAGNOSIS — I4811 Longstanding persistent atrial fibrillation: Secondary | ICD-10-CM | POA: Diagnosis not present

## 2022-11-07 DIAGNOSIS — Z7901 Long term (current) use of anticoagulants: Secondary | ICD-10-CM

## 2022-11-07 DIAGNOSIS — Z125 Encounter for screening for malignant neoplasm of prostate: Secondary | ICD-10-CM

## 2022-11-07 DIAGNOSIS — E119 Type 2 diabetes mellitus without complications: Secondary | ICD-10-CM | POA: Diagnosis not present

## 2022-11-07 DIAGNOSIS — E7800 Pure hypercholesterolemia, unspecified: Secondary | ICD-10-CM | POA: Diagnosis not present

## 2022-11-07 DIAGNOSIS — I10 Essential (primary) hypertension: Secondary | ICD-10-CM | POA: Diagnosis not present

## 2022-11-07 DIAGNOSIS — Z0000 Encounter for general adult medical examination without abnormal findings: Secondary | ICD-10-CM

## 2022-11-07 LAB — PSA, MEDICARE: PSA: 2.89 ng/mL (ref 0.10–4.00)

## 2022-11-07 NOTE — Progress Notes (Signed)
Office Note 11/07/2022  CC:  Chief Complaint  Patient presents with   Annual Exam    Pt is fasting.    Patient is a 62 y.o. male who is here for annual health maintenance exam and 68-month follow-up diabetes, hypertension, and hyperlipidemia. A/P as of last visit: "#1 diabetes, well controlled with diet only. POC Hba1c is 6.0% today.    2.  Hypertension, well controlled on Toprol-XL 25 mg a day and lisinopril 20 mg a day.   3.  Hypercholesterolemia, doing well on atorvastatin 40 mg a day.   4. Chronic atrial fibrillation. Stable/rate controlled on digoxin 0.125 mg a day and Toprol-XL 25 mg a day.  Stroke prophylaxis with Xarelto 20 mg a day."  INTERIM HX: Robert [name] is doing well. He mows his yard and his son's yard.  He does have a limp in his right hip feels stiff but gets better throughout the day.  He walks with a cane for the most part.  No home blood pressure monitoring. He has no burning, tingling, or numbness in his feet.  Past Medical History:  Diagnosis Date   Alcohol abuse    ongoing as of 07/2017.  Quit after CVA 05/2018   Arthritis    Atrial fibrillation (HCC)    Not candidate for anticoagulation b/c of GI bleeding; had to get transfused.  Then, pt had CVA 05/2018 and he was put on xarelto and he quit drinking.   Coronary atherosclerosis    Noted on lung cancer screening CT   COVID-19 virus infection 06/2019   CVA (cerebral vascular accident) (HCC) 05/2018   large R MCA territory infarct.  CTA neg for large cerebral vessel occlusion.  R ICA 70% stenosis, L clear. Rpt CTA neck ordered by neuro as of 01/2019.   Gastric ulcer    hx of--unclear (pt and wife deny this)   Gout    Usually MTP joint   History of adenomatous polyp of colon 2021   recall 2024   History of digital clubbing    "all my adult life"--CXR 08/2016 = bronchitic changes o/w normal.   Hyperkalemia 04/2017   Normalized with decreasing lisinopril from 40 mg qd to 20 mg qd.   Hyperlipidemia 07/2016    Holding off on statin until pt quits drinking.  Back on statin after CVA 05/2018   Hypertension    Nonischemic cardiomyopathy (HCC) 2015; 2020   2015: "resolved" (w/medical therapy?).  05/2018 EF 35-40%, with TR and MR and ++LAE. 05/2019 EF 50-52%, low normal LV wall motion, grd II DD, mod MR and mod TR, mild pulm HTN.   Nonrheumatic mitral valve regurgitation    Osteoarthritis of right hip    Paralysis (HCC)    some left side deficit from CVA in 2020   Pre-diabetes    Screening for lung cancer    04/2021 CT showed no lesions/nodules.  Repeat 1 year   Sleep apnea    Tobacco abuse    Quit 2014    Past Surgical History:  Procedure Laterality Date   CARDIAC CATHETERIZATION  2015   Normal coronaries   CARDIOVERSION     DCCV 2015   COLONOSCOPY  08/09/2019   One large adenoma-->Rpt done 10/05/22   TONSILLECTOMY     TOTAL HIP ARTHROPLASTY Right 08/03/2021   Procedure: TOTAL HIP ARTHROPLASTY ANTERIOR APPROACH;  Surgeon: Durene Romans, MD;  Location: [facility];  Service: Orthopedics;  Laterality: Right;   TRANSTHORACIC ECHOCARDIOGRAM  06/08/2018; 05/2019   05/2018 (in context of  acute CVA): EF 35-40%, mild/mod MR, mod/sev TR, severely dilated LA.  05/2019 EF 50-52%, low normal LV wall motion, grd II DD, mod MR and mod TR, mild pulm HTN.   TRANSTHORACIC ECHOCARDIOGRAM     07/06/21 EF 50-55%,normal WM, mild elev RVP, mild MR.    Family History  Problem Relation Age of Onset   Diabetes Mother    Congestive Heart Failure Mother    Alcohol abuse Father    Hypertension Father    Stroke Father    Heart disease Paternal Grandfather     Social History   Socioeconomic History   Marital status: Married    Spouse name: Not on file   Number of children: Not on file   Years of education: Not on file   Highest education level: Not on file  Occupational History   Not on file  Tobacco Use   Smoking status: Former    Current packs/day: 0.00    Average packs/day: 1 pack/day for 5.0 years (5.0 ttl  pk-yrs)    Types: Cigarettes    Start date: 01/11/2008    Quit date: 01/10/2013    Years since quitting: 9.8   Smokeless tobacco: Never  Vaping Use   Vaping status: Former  Substance and Sexual Activity   Alcohol use: Not Currently    Comment: drinks liquor daily   Drug use: No   Sexual activity: Not on file  Other Topics Concern   Not on file  Social History Narrative   Married, 2 adult children.   Educ: 11th grade   Occup: "out of work".  Worked for [employer]--was let go for not abiding by [employer] per pt/wife.   Tob: former--quit 2014.  "Of and on prior".   Alc: long hx of drinking a pint and 1 and 1/2 pint of whiskey per day x years---quit approx May 2020.Marland Kitchen   Drugs: none.   Social Determinants of Health   Financial Resource Strain: Low Risk  (08/03/2022)   Overall Financial Resource Strain (CARDIA)    Difficulty of Paying Living Expenses: Not hard at all  Food Insecurity: No Food Insecurity (08/03/2022)   Hunger Vital Sign    Worried About Running Out of Food in the Last Year: Never true    Ran Out of Food in the Last Year: Never true  Transportation Needs: No Transportation Needs (08/03/2022)   PRAPARE - Administrator, Civil Service (Medical): No    Lack of Transportation (Non-Medical): No  Physical Activity: Insufficiently Active (08/03/2022)   Exercise Vital Sign    Days of Exercise per Week: 2 days    Minutes of Exercise per Session: 60 min  Stress: No Stress Concern Present (08/03/2022)   [employer] of Occupational Health - Occupational Stress Questionnaire    Feeling of Stress : Not at all  Social Connections: Moderately Isolated (08/03/2022)   Social Connection and Isolation Panel [NHANES]    Frequency of Communication with Friends and Family: Three times a week    Frequency of Social Gatherings with Friends and Family: Twice a week    Attends Religious Services: Never    Database administrator or Organizations: No    Attends Tax inspector Meetings: Never    Marital Status: Married  Catering manager Violence: Not At Risk (08/03/2022)   Humiliation, Afraid, Rape, and Kick questionnaire    Fear of Current or Ex-Partner: No    Emotionally Abused: No    Physically Abused: No  Sexually Abused: No    Outpatient Medications Prior to Visit  Medication Sig Dispense Refill   allopurinol (ZYLOPRIM) 100 MG tablet Take 2 tablets (200 mg total) by mouth daily. 180 tablet 1   atorvastatin (LIPITOR) 40 MG tablet TAKE 1 TABLET BY MOUTH DAILY 90 tablet 1   digoxin (LANOXIN) 0.125 MG tablet Take by mouth every other day.     folic acid (FOLVITE) 1 MG tablet Take 1 tablet (1 mg total) by mouth daily. 90 tablet 1   lisinopril (ZESTRIL) 20 MG tablet TAKE 1 TABLET BY MOUTH DAILY 90 tablet 1   metoprolol succinate (TOPROL-XL) 25 MG 24 hr tablet TAKE 1 TABLET BY MOUTH DAILY 90 tablet 1   pantoprazole (PROTONIX) 40 MG tablet Take 1 tablet (40 mg total) by mouth 2 (two) times daily. 180 tablet 1   polyethylene glycol (MIRALAX / GLYCOLAX) 17 g packet Take 17 g by mouth daily as needed for mild constipation. 14 each 0   XARELTO 20 MG TABS tablet TAKE 1 TABLET BY MOUTH DAILY WITH SUPPER 30 tablet 5   No facility-administered medications prior to visit.    No Known Allergies  Review of Systems  Constitutional:  Negative for appetite change, chills, fatigue and fever.  HENT:  Negative for congestion, dental problem, ear pain and sore throat.   Eyes:  Negative for discharge, redness and visual disturbance.  Respiratory:  Negative for cough, chest tightness, shortness of breath and wheezing.   Cardiovascular:  Negative for chest pain, palpitations and leg swelling.  Gastrointestinal:  Negative for abdominal pain, blood in stool, diarrhea, nausea and vomiting.  Genitourinary:  Negative for difficulty urinating, dysuria, flank pain, frequency, hematuria and urgency.  Musculoskeletal:  Negative for arthralgias, back pain, joint swelling,  myalgias and neck stiffness.  Skin:  Negative for pallor and rash.  Neurological:  Negative for dizziness, speech difficulty, weakness and headaches.  Hematological:  Negative for adenopathy. Does not bruise/bleed easily.  Psychiatric/Behavioral:  Negative for confusion and sleep disturbance. The patient is not nervous/anxious.     PE;    11/07/2022    8:27 AM 08/03/2022    3:00 PM 04/21/2022    2:06 PM  Vitals with BMI  Height 5\' 7"     Weight 191 lbs 6 oz 192 lbs 192 lbs 10 oz  BMI 29.97    Systolic 134  120  Diastolic 75  76  Pulse 55  75    Gen: Alert, well appearing.  Patient is oriented to person, place, time, and situation. AFFECT: pleasant, lucid thought and speech. ENT: Ears: EACs clear, normal epithelium.  TMs with good light reflex and landmarks bilaterally.  Eyes: no injection, icteris, swelling, or exudate.  EOMI, PERRLA. Nose: no drainage or turbinate edema/swelling.  No injection or focal lesion.  Mouth: lips without lesion/swelling.  Oral mucosa pink and moist.  Dentition intact and without obvious caries or gingival swelling.  Oropharynx without erythema, exudate, or swelling.  Neck: supple/nontender.  No LAD, mass, or TM.  Carotid pulses 2+ bilaterally, without bruits. CV: Irregularly irregular, rate approximately 60, no m/r/g.   LUNGS: CTA bilat, nonlabored resps, good aeration in all lung fields. ABD: soft, NT, ND, BS normal.  No hepatospenomegaly or mass.  No bruits. EXT: no clubbing, cyanosis, or edema.  Musculoskeletal: no joint swelling, erythema, warmth, or tenderness.  ROM of all joints intact. Skin - no sores or suspicious lesions or rashes or color changes Foot exam - ; no swelling, tenderness or skin or  vascular lesions. Color and temperature is normal. Sensation is intact. Peripheral pulses are palpable. Toenails are normal.  Pertinent labs:  Lab Results  Component Value Date   TSH 1.20 05/15/2020   Lab Results  Component Value Date   WBC 6.0  10/18/2021   HGB 13.0 (L) 10/18/2021   HCT 39.9 10/18/2021   MCV 86.6 10/18/2021   PLT 327 10/18/2021   Lab Results  Component Value Date   CREATININE 0.94 10/18/2021   BUN 17 10/18/2021   NA 141 10/18/2021   K 4.4 10/18/2021   CL 100 10/18/2021   CO2 31 10/18/2021   Lab Results  Component Value Date   ALT 11 10/18/2021   AST 14 10/18/2021   ALKPHOS 79 07/22/2021   BILITOT 0.6 10/18/2021   Lab Results  Component Value Date   CHOL 152 10/18/2021   Lab Results  Component Value Date   HDL 65 10/18/2021   Lab Results  Component Value Date   LDLCALC 73 10/18/2021   Lab Results  Component Value Date   TRIG 61 10/18/2021   Lab Results  Component Value Date   CHOLHDL 2.3 10/18/2021   Lab Results  Component Value Date   PSA 2.99 10/18/2021   PSA 2.09 05/15/2020   PSA 1.70 05/09/2019   Lab Results  Component Value Date   HGBA1C 6.0 (A) 04/21/2022   HGBA1C 6.0 04/21/2022   HGBA1C 6.0 04/21/2022   HGBA1C 6.0 04/21/2022   ASSESSMENT AND PLAN:   #1 health maintenance exam: Reviewed age and gender appropriate health maintenance issues (prudent diet, regular exercise, health risks of tobacco and excessive alcohol, use of seatbelts, fire alarms in home, use of sunscreen).  Also reviewed age and gender appropriate health screening as well as vaccine recommendations. Vaccines: Flu UTD. Labs: HP, a1c, psa Prostate ca screening: PSA today. Colon ca screening: UTD as of 09/2022.  Recall 2029.  #2 diabetes, without complication, diet controlled. POC Hba1c ordered today. Urine microalb/cr today. Feet exam today is normal.  #3 hypertension, well controlled on Toprol-XL 25 mg a day and lisinopril 20 mg a day. Electrolytes and creatinine monitoring today.  4.  Hypercholesterolemia, doing well on atorvastatin 40 mg a day. Lipid and hepatic panel today.  5. Chronic atrial fibrillation. Stable/rate controlled on digoxin 0.125 mg a day and Toprol-XL 25 mg a day.  Stroke  prophylaxis with Xarelto 20 mg a day. CBC monitoring today.  An After Visit Summary was printed and given to the patient.  FOLLOW UP:  Return in about 6 months (around 05/08/2023) for routine chronic illness f/u.  Signed:  Santiago Bumpers, MD           11/07/2022

## 2022-11-07 NOTE — Telephone Encounter (Signed)
Pt had CPE appt today, cbc lab drawn for monitoring.  Please confirm if refill appropriate or if waiting for results first.

## 2022-11-08 LAB — LIPID PANEL
Cholesterol: 147 mg/dL (ref <200)
HDL: 69 mg/dL (ref >=40)
LDL Cholesterol (Calc): 64 mg/dL
Non-HDL Cholesterol (Calc): 78 mg/dL (ref <130)
Total CHOL/HDL Ratio: 2.1 (calc) (ref <5.0)
Triglycerides: 55 mg/dL (ref <150)

## 2022-11-08 LAB — HEMOGLOBIN A1C
Hgb A1c MFr Bld: 6.3 %{Hb} — ABNORMAL HIGH (ref <5.7)
Mean Plasma Glucose: 134 mg/dL
eAG (mmol/L): 7.4 mmol/L

## 2022-11-08 LAB — CBC WITH DIFFERENTIAL/PLATELET
Absolute Lymphocytes: 1937 {cells}/uL (ref 850–3900)
Absolute Monocytes: 667 {cells}/uL (ref 200–950)
Basophils Absolute: 41 {cells}/uL (ref 0–200)
Basophils Relative: 0.7 %
Eosinophils Absolute: 52 {cells}/uL (ref 15–500)
Eosinophils Relative: 0.9 %
HCT: 40 % (ref 38.5–50.0)
Hemoglobin: 12.4 g/dL — ABNORMAL LOW (ref 13.2–17.1)
MCH: 27.3 pg (ref 27.0–33.0)
MCHC: 31 g/dL — ABNORMAL LOW (ref 32.0–36.0)
MCV: 88.1 fL (ref 80.0–100.0)
MPV: 11.5 fL (ref 7.5–12.5)
Monocytes Relative: 11.5 %
Neutro Abs: 3103 {cells}/uL (ref 1500–7800)
Neutrophils Relative %: 53.5 %
Platelets: 225 10*3/uL (ref 140–400)
RBC: 4.54 10*6/uL (ref 4.20–5.80)
RDW: 13.8 % (ref 11.0–15.0)
Total Lymphocyte: 33.4 %
WBC: 5.8 10*3/uL (ref 3.8–10.8)

## 2022-11-08 LAB — COMPREHENSIVE METABOLIC PANEL
AG Ratio: 1.8 (calc) (ref 1.0–2.5)
ALT: 13 U/L (ref 9–46)
AST: 17 U/L (ref 10–35)
Albumin: 4.7 g/dL (ref 3.6–5.1)
Alkaline phosphatase (APISO): 78 U/L (ref 35–144)
BUN: 15 mg/dL (ref 7–25)
CO2: 31 mmol/L (ref 20–32)
Calcium: 9.5 mg/dL (ref 8.6–10.3)
Chloride: 102 mmol/L (ref 98–110)
Creat: 1.04 mg/dL (ref 0.70–1.35)
Globulin: 2.6 g/dL (ref 1.9–3.7)
Glucose, Bld: 111 mg/dL — ABNORMAL HIGH (ref 65–99)
Potassium: 4.5 mmol/L (ref 3.5–5.3)
Sodium: 141 mmol/L (ref 135–146)
Total Bilirubin: 0.8 mg/dL (ref 0.2–1.2)
Total Protein: 7.3 g/dL (ref 6.1–8.1)

## 2022-11-08 LAB — MICROALBUMIN / CREATININE URINE RATIO
Creatinine, Urine: 139 mg/dL (ref 20–320)
Microalb Creat Ratio: 11 mg/g{creat} (ref <30)
Microalb, Ur: 1.5 mg/dL

## 2022-11-08 NOTE — Progress Notes (Signed)
Left pt VM

## 2022-12-22 ENCOUNTER — Other Ambulatory Visit: Payer: Self-pay | Admitting: Family Medicine

## 2023-01-03 ENCOUNTER — Other Ambulatory Visit: Payer: Self-pay | Admitting: Family Medicine

## 2023-01-07 ENCOUNTER — Other Ambulatory Visit: Payer: Self-pay | Admitting: Family Medicine

## 2023-02-04 ENCOUNTER — Other Ambulatory Visit: Payer: Self-pay | Admitting: Family Medicine

## 2023-03-20 DIAGNOSIS — I4891 Unspecified atrial fibrillation: Secondary | ICD-10-CM | POA: Diagnosis not present

## 2023-03-20 DIAGNOSIS — E785 Hyperlipidemia, unspecified: Secondary | ICD-10-CM | POA: Diagnosis not present

## 2023-03-20 DIAGNOSIS — I10 Essential (primary) hypertension: Secondary | ICD-10-CM | POA: Diagnosis not present

## 2023-03-20 DIAGNOSIS — I4892 Unspecified atrial flutter: Secondary | ICD-10-CM | POA: Diagnosis not present

## 2023-03-20 DIAGNOSIS — R42 Dizziness and giddiness: Secondary | ICD-10-CM | POA: Diagnosis not present

## 2023-03-22 ENCOUNTER — Other Ambulatory Visit: Payer: Self-pay | Admitting: Family Medicine

## 2023-03-23 NOTE — Telephone Encounter (Signed)
 The requested medication is being managed by his cardiologist, Wille Glaser, MD

## 2023-03-29 ENCOUNTER — Other Ambulatory Visit: Payer: Self-pay | Admitting: Family Medicine

## 2023-03-29 NOTE — Telephone Encounter (Signed)
 Copied from CRM [id number]. Topic: Clinical - Medication Refill >> Mar 29, 2023  4:15 PM Adaysia C wrote: Most Recent Primary Care Visit:  Provider: Jeoffrey Massed  Department: [facility]  Visit Type: PHYSICAL  Date: 11/07/2022  Medication: metoprolol succinate (TOPROL-XL) 25 MG 24 hr tablet  Has the patient contacted their pharmacy? No, patient initiated RX refill through provider (Agent: If no, request that the patient contact the pharmacy for the refill. If patient does not wish to contact the pharmacy document the reason why and proceed with request.) (Agent: If yes, when and what did the pharmacy advise?)  Is this the correct pharmacy for this prescription? Yes If no, delete pharmacy and type the correct one.  This is the patient's preferred pharmacy:  [id number] - [place], [place] - [address] [place] [address] Phone: [phone] Fax: [phone]   Has the prescription been filled recently? No  Is the patient out of the medication? No  Has the patient been seen for an appointment in the last year OR does the patient have an upcoming appointment? Yes  Can we respond through MyChart? No  Agent: Please be advised that Rx refills may take up to 3 business days. We ask that you follow-up with your pharmacy.

## 2023-03-30 DIAGNOSIS — R42 Dizziness and giddiness: Secondary | ICD-10-CM | POA: Diagnosis not present

## 2023-03-30 DIAGNOSIS — I6523 Occlusion and stenosis of bilateral carotid arteries: Secondary | ICD-10-CM | POA: Diagnosis not present

## 2023-03-30 DIAGNOSIS — I10 Essential (primary) hypertension: Secondary | ICD-10-CM | POA: Diagnosis not present

## 2023-03-30 MED ORDER — METOPROLOL SUCCINATE ER 25 MG PO TB24: 25.0000 mg | ORAL_TABLET | Freq: Every day | ORAL | 1 refills | Status: DC

## 2023-05-02 ENCOUNTER — Other Ambulatory Visit: Payer: Self-pay | Admitting: Family Medicine

## 2023-05-04 ENCOUNTER — Other Ambulatory Visit: Payer: Self-pay | Admitting: Family Medicine

## 2023-05-08 ENCOUNTER — Ambulatory Visit (INDEPENDENT_AMBULATORY_CARE_PROVIDER_SITE_OTHER): Payer: Medicare HMO | Admitting: Family Medicine

## 2023-05-08 ENCOUNTER — Encounter: Payer: Self-pay | Admitting: Family Medicine

## 2023-05-08 VITALS — BP 130/71 | HR 58 | Temp 99.0°F | Ht 67.0 in | Wt 186.6 lb

## 2023-05-08 DIAGNOSIS — E7800 Pure hypercholesterolemia, unspecified: Secondary | ICD-10-CM | POA: Diagnosis not present

## 2023-05-08 DIAGNOSIS — E119 Type 2 diabetes mellitus without complications: Secondary | ICD-10-CM | POA: Diagnosis not present

## 2023-05-08 DIAGNOSIS — I10 Essential (primary) hypertension: Secondary | ICD-10-CM

## 2023-05-08 LAB — POCT GLYCOSYLATED HEMOGLOBIN (HGB A1C)
HbA1c POC (<> result, manual entry): 5.7 % (ref 4.0–5.6)
HbA1c, POC (controlled diabetic range): 5.7 % (ref 0.0–7.0)
HbA1c, POC (prediabetic range): 5.7 % (ref 5.7–6.4)
Hemoglobin A1C: 5.7 % — AB (ref 4.0–5.6)

## 2023-05-08 LAB — LIPID PANEL
Cholesterol: 129 mg/dL (ref 0–200)
HDL: 63.3 mg/dL (ref >39.00)
LDL Cholesterol: 56 mg/dL (ref 0–99)
NonHDL: 66.18
Total CHOL/HDL Ratio: 2
Triglycerides: 51 mg/dL (ref 0.0–149.0)
VLDL: 10.2 mg/dL (ref 0.0–40.0)

## 2023-05-08 LAB — BASIC METABOLIC PANEL WITH GFR
BUN: 13 mg/dL (ref 6–23)
CO2: 34 meq/L — ABNORMAL HIGH (ref 19–32)
Calcium: 9.6 mg/dL (ref 8.4–10.5)
Chloride: 100 meq/L (ref 96–112)
Creatinine, Ser: 0.95 mg/dL (ref 0.40–1.50)
GFR: 85.27 mL/min (ref >60.00)
Glucose, Bld: 102 mg/dL — ABNORMAL HIGH (ref 70–99)
Potassium: 4.5 meq/L (ref 3.5–5.1)
Sodium: 140 meq/L (ref 135–145)

## 2023-05-08 MED ORDER — LISINOPRIL 20 MG PO TABS: 20.0000 mg | ORAL_TABLET | Freq: Every day | ORAL | 1 refills | Status: DC

## 2023-05-08 NOTE — Progress Notes (Signed)
 OFFICE VISIT  05/08/2023  CC:  Chief Complaint  Patient presents with   Medical Management of Chronic Issues    Pt is fasting    Patient is a 63 y.o. male who presents for 58-month follow-up diabetes, hypertension, hypercholesterolemia, and chronic atrial fibrillation. A/P as of last visit: "#1 health maintenance exam: Reviewed age and gender appropriate health maintenance issues (prudent diet, regular exercise, health risks of tobacco and excessive alcohol, use of seatbelts, fire alarms in home, use of sunscreen).  Also reviewed age and gender appropriate health screening as well as vaccine recommendations. Vaccines: Flu UTD. Labs: HP, a1c, psa Prostate ca screening: PSA today. Colon ca screening: UTD as of 09/2022.  Recall 2029.   #2 diabetes, without complication, diet controlled. POC Hba1c ordered today. Urine microalb/cr today. Feet exam today is normal.   #3 hypertension, well controlled on Toprol -XL 25 mg a day and lisinopril  20 mg a day. Electrolytes and creatinine monitoring today.   4.  Hypercholesterolemia, doing well on atorvastatin  40 mg a day. Lipid and hepatic panel today.   5. Chronic atrial fibrillation. Stable/rate controlled on digoxin  0.125 mg a day and Toprol -XL 25 mg a day.  Stroke prophylaxis with Xarelto  20 mg a day. CBC monitoring today."  INTERIM HX: Feeling well. He tends to eat 1 very large meal a day.  He sometimes sits for hours out in the sun without a shirt on.  This eventually makes him feel bad and he goes in and sleeps. No problems with dizziness.  No falls.  ROS as above, plus--> no fevers, no CP, no SOB, no wheezing, no cough, no dizziness, no HAs, no rashes, no melena/hematochezia.  No polyuria or polydipsia.  No myalgias or arthralgias.  No focal weakness, paresthesias, or tremors.  No acute vision or hearing abnormalities.  No dysuria or unusual/new urinary urgency or frequency.  No recent changes in lower legs. No n/v/d or abd pain.  No  palpitations.    Past Medical History:  Diagnosis Date   Alcohol abuse    ongoing as of 07/2017.  Quit after CVA 05/2018   Arthritis    Atrial fibrillation (HCC)    Not candidate for anticoagulation b/c of GI bleeding; had to get transfused.  Then, pt had CVA 05/2018 and he was put on xarelto  and he quit drinking.   Coronary atherosclerosis    Noted on lung cancer screening CT   COVID-19 virus infection 06/2019   CVA (cerebral vascular accident) (HCC) 05/2018   large R MCA territory infarct.  CTA neg for large cerebral vessel occlusion.  R ICA 70% stenosis, L clear. Rpt CTA neck ordered by neuro as of 01/2019.   Gastric ulcer    hx of--unclear (pt and wife deny this)   Gout    Usually MTP joint   History of adenomatous polyp of colon 2021   recall 2024   History of digital clubbing    "all my adult life"--CXR 08/2016 = bronchitic changes o/w normal.   Hyperkalemia 04/2017   Normalized with decreasing lisinopril  from 40 mg qd to 20 mg qd.   Hyperlipidemia 07/2016   Holding off on statin until pt quits drinking.  Back on statin after CVA 05/2018   Hypertension    Nonischemic cardiomyopathy (HCC) 2015; 2020   2015: "resolved" (w/medical therapy?).  05/2018 EF 35-40%, with TR and MR and ++LAE. 05/2019 EF 50-52%, low normal LV wall motion, grd II DD, mod MR and mod TR, mild pulm HTN.  Nonrheumatic mitral valve regurgitation    Osteoarthritis of right hip    Paralysis (HCC)    some left side deficit from CVA in 2020   Pre-diabetes    Screening for lung cancer    04/2021 CT showed no lesions/nodules.  Repeat 1 year   Sleep apnea    Tobacco abuse    Quit 2014    Past Surgical History:  Procedure Laterality Date   CARDIAC CATHETERIZATION  2015   Normal coronaries   CARDIOVERSION     DCCV 2015   COLONOSCOPY  08/09/2019   One large adenoma-->Rpt done 10/05/22   TONSILLECTOMY     TOTAL HIP ARTHROPLASTY Right 08/03/2021   Procedure: TOTAL HIP ARTHROPLASTY ANTERIOR APPROACH;  Surgeon:  Claiborne Crew, MD;  Location: [facility];  Service: Orthopedics;  Laterality: Right;   TRANSTHORACIC ECHOCARDIOGRAM  06/08/2018; 05/2019   05/2018 (in context of acute CVA): EF 35-40%, mild/mod MR, mod/sev TR, severely dilated LA.  05/2019 EF 50-52%, low normal LV wall motion, grd II DD, mod MR and mod TR, mild pulm HTN.   TRANSTHORACIC ECHOCARDIOGRAM     07/06/21 EF 50-55%,normal WM, mild elev RVP, mild MR.    Outpatient Medications Prior to Visit  Medication Sig Dispense Refill   allopurinol  (ZYLOPRIM ) 100 MG tablet TAKE 2 TABLETS BY MOUTH DAILY 180 tablet 1   atorvastatin  (LIPITOR) 40 MG tablet TAKE 1 TABLET BY MOUTH DAILY 90 tablet 1   digoxin  (LANOXIN ) 0.125 MG tablet Take by mouth every other day.     folic acid  (FOLVITE ) 1 MG tablet TAKE 1 TABLET BY MOUTH DAILY 90 tablet 1   metoprolol  succinate (TOPROL -XL) 25 MG 24 hr tablet Take 1 tablet (25 mg total) by mouth daily. 90 tablet 1   pantoprazole  (PROTONIX ) 40 MG tablet TAKE 1 TABLET BY MOUTH 2 TIMES A DAY 180 tablet 1   polyethylene glycol (MIRALAX  / GLYCOLAX ) 17 g packet Take 17 g by mouth daily as needed for mild constipation. 14 each 0   XARELTO  20 MG TABS tablet Take 1 tablet (20 mg total) by mouth daily with supper. MUST KEEP APPT FOR FURTHER REFILLS 90 tablet 1   lisinopril  (ZESTRIL ) 20 MG tablet TAKE 1 TABLET BY MOUTH DAILY 90 tablet 1   No facility-administered medications prior to visit.    No Known Allergies  Review of Systems As per HPI  PE:    05/08/2023    8:51 AM 11/07/2022    8:27 AM 08/03/2022    3:00 PM  Vitals with BMI  Height 5\' 7"  5\' 7"    Weight 186 lbs 10 oz 191 lbs 6 oz 192 lbs  BMI 29.22 29.97   Systolic 130 134   Diastolic 71 75   Pulse 58 55      Physical Exam  Gen: Alert, well appearing.  Patient is oriented to person, place, time, and situation. AFFECT: pleasant, lucid thought and speech. CV: irreg irreg, rate 60s, no murmur Chest is clear, no wheezing or rales. Normal symmetric air entry  throughout both lung fields. No chest wall deformities or tenderness. EXT: trace to 1+ pitting edema, L>R.   LABS:  Last CBC Lab Results  Component Value Date   WBC 5.8 11/07/2022   HGB 12.4 (L) 11/07/2022   HCT 40.0 11/07/2022   MCV 88.1 11/07/2022   MCH 27.3 11/07/2022   RDW 13.8 11/07/2022   PLT 225 11/07/2022   Last metabolic panel Lab Results  Component Value Date   GLUCOSE 111 (H)  11/07/2022   NA 141 11/07/2022   K 4.5 11/07/2022   CL 102 11/07/2022   CO2 31 11/07/2022   BUN 15 11/07/2022   CREATININE 1.04 11/07/2022   GFRNONAA >60 08/04/2021   CALCIUM  9.5 11/07/2022   PHOS 2.9 06/13/2018   PROT 7.3 11/07/2022   ALBUMIN 4.6 07/22/2021   BILITOT 0.8 11/07/2022   ALKPHOS 79 07/22/2021   AST 17 11/07/2022   ALT 13 11/07/2022   ANIONGAP 10 08/04/2021   Last lipids Lab Results  Component Value Date   CHOL 147 11/07/2022   HDL 69 11/07/2022   LDLCALC 64 11/07/2022   TRIG 55 11/07/2022   CHOLHDL 2.1 11/07/2022   Last hemoglobin A1c Lab Results  Component Value Date   HGBA1C 5.7 (A) 05/08/2023   HGBA1C 5.7 05/08/2023   HGBA1C 5.7 05/08/2023   HGBA1C 5.7 05/08/2023   Last thyroid functions Lab Results  Component Value Date   TSH 1.20 05/15/2020   Lab Results  Component Value Date   PSA 2.89 11/07/2022   PSA 2.99 10/18/2021   PSA 2.09 05/15/2020   Lab Results  Component Value Date   DIGOXIN  <0.5 (L) 05/15/2020   IMPRESSION AND PLAN:  #1 diabetes without complication (dx'd by fasting gluc criteria.  Max A1c 6.3%) , well-controlled without medication. POC Hba1c 5.7% today.  2 hypertension, well controlled on Toprol -XL 25 mg a day and lisinopril  20 mg a day. Electrolytes and creatinine monitoring today.   3.  Hypercholesterolemia, doing well on atorvastatin  40 mg a day. Lipid panel today.   4. Chronic atrial fibrillation. Stable/rate controlled on digoxin  0.125 mg every other day and Toprol -XL 25 mg a day.  Stroke prophylaxis with Xarelto  20  mg a day.  An After Visit Summary was printed and given to the patient.  FOLLOW UP: Return in about 6 months (around 11/07/2023) for annual CPE (fasting).  Signed:  Arletha Lady, MD           05/08/2023

## 2023-06-17 ENCOUNTER — Other Ambulatory Visit: Payer: Self-pay | Admitting: Family Medicine

## 2023-07-02 ENCOUNTER — Other Ambulatory Visit: Payer: Self-pay | Admitting: Family Medicine

## 2023-07-16 ENCOUNTER — Other Ambulatory Visit: Payer: Self-pay | Admitting: Family Medicine

## 2023-08-02 ENCOUNTER — Encounter

## 2023-08-03 ENCOUNTER — Other Ambulatory Visit: Payer: Self-pay | Admitting: Family Medicine

## 2023-08-23 ENCOUNTER — Encounter

## 2023-09-18 DIAGNOSIS — I4891 Unspecified atrial fibrillation: Secondary | ICD-10-CM | POA: Diagnosis not present

## 2023-09-18 DIAGNOSIS — I4892 Unspecified atrial flutter: Secondary | ICD-10-CM | POA: Diagnosis not present

## 2023-09-18 DIAGNOSIS — I340 Nonrheumatic mitral (valve) insufficiency: Secondary | ICD-10-CM | POA: Diagnosis not present

## 2023-09-18 DIAGNOSIS — I428 Other cardiomyopathies: Secondary | ICD-10-CM | POA: Diagnosis not present

## 2023-09-18 DIAGNOSIS — R42 Dizziness and giddiness: Secondary | ICD-10-CM | POA: Diagnosis not present

## 2023-09-18 DIAGNOSIS — I10 Essential (primary) hypertension: Secondary | ICD-10-CM | POA: Diagnosis not present

## 2023-09-23 ENCOUNTER — Other Ambulatory Visit: Payer: Self-pay | Admitting: Family Medicine

## 2023-10-22 ENCOUNTER — Other Ambulatory Visit: Payer: Self-pay | Admitting: Family Medicine

## 2023-10-30 ENCOUNTER — Other Ambulatory Visit: Payer: Self-pay | Admitting: Family Medicine

## 2023-11-03 ENCOUNTER — Encounter: Payer: Self-pay | Admitting: Family Medicine

## 2023-11-03 ENCOUNTER — Telehealth: Payer: Self-pay

## 2023-11-03 NOTE — Telephone Encounter (Signed)
 Pt's wife advised letter completed and placed at the front desk in brown folder.

## 2023-11-03 NOTE — Telephone Encounter (Signed)
 Primary Information  Source  Robert [name] (Patient)   Subject  Robert [name] (Patient)   Topic  Clinical - Medical Advice    Communication  Reason for CRM: Patient is requesting a note from the doctor to be removed from the jury pool. His jury date is 11/19 and he needs it prior to that date.    Patient wife stated that if possible she can come get it panama

## 2023-11-03 NOTE — Telephone Encounter (Signed)
 Letter printed and signed.

## 2023-11-05 ENCOUNTER — Other Ambulatory Visit: Payer: Self-pay | Admitting: Family Medicine

## 2023-11-19 ENCOUNTER — Other Ambulatory Visit: Payer: Self-pay | Admitting: Family Medicine

## 2023-11-22 ENCOUNTER — Other Ambulatory Visit: Payer: Self-pay

## 2023-11-22 MED ORDER — METOPROLOL SUCCINATE ER 25 MG PO TB24: 25.0000 mg | ORAL_TABLET | Freq: Every day | ORAL | 0 refills | Status: AC

## 2023-11-24 ENCOUNTER — Ambulatory Visit (INDEPENDENT_AMBULATORY_CARE_PROVIDER_SITE_OTHER): Admitting: Family Medicine

## 2023-11-24 ENCOUNTER — Encounter: Payer: Self-pay | Admitting: Family Medicine

## 2023-11-24 ENCOUNTER — Ambulatory Visit: Payer: Self-pay

## 2023-11-24 VITALS — BP 119/79 | HR 63 | Temp 97.8°F | Ht 67.0 in | Wt 170.6 lb

## 2023-11-24 DIAGNOSIS — Z23 Encounter for immunization: Secondary | ICD-10-CM

## 2023-11-24 DIAGNOSIS — L720 Epidermal cyst: Secondary | ICD-10-CM | POA: Diagnosis not present

## 2023-11-24 DIAGNOSIS — L089 Local infection of the skin and subcutaneous tissue, unspecified: Secondary | ICD-10-CM | POA: Diagnosis not present

## 2023-11-24 MED ORDER — CEPHALEXIN 500 MG PO CAPS: 500.0000 mg | ORAL_CAPSULE | Freq: Three times a day (TID) | ORAL | 0 refills | Status: DC

## 2023-11-24 NOTE — Telephone Encounter (Signed)
 FYI Only or Action Required?: FYI only for provider: appointment scheduled on today.  Patient was last seen in primary care on 05/08/2023 by McGowen, Aleene DEL, MD.  Called Nurse Triage reporting Abscess.  Symptoms began a couple of months ago.  Interventions attempted: Other: boil has been drained at home.  Symptoms are: gradually worsening.  Triage Disposition: See Physician Within 24 Hours  Patient/caregiver understands and will follow disposition?: Yes     Copied from CRM #[id number]. Topic: Clinical - Red Word Triage >> Nov 24, 2023  9:19 AM Adelita E wrote: [facility] that prompted transfer to Nurse Triage: Wife, Dorothe, called in stating that patient has a knot on his back near waistline that is red/purple in color and looking like it may be infected as they tried popping it and white pus tinged with blood came out. Patient is having pain in that area.      Reason for Disposition  [1] Boil AND [2] diabetes mellitus or weak immune system (e.g., HIV positive, cancer chemo, splenectomy, organ transplant, chronic steroids)  Answer Assessment - Initial Assessment Questions 1. APPEARANCE of BOIL: What does the boil look like?      Red/purple 2. LOCATION: Where is the boil located?      Back near waist  3. NUMBER: How many boils are there?      One  4. SIZE: How big is the boil? (e.g., inches, cm; compare to size of a coin or other object)     50 cent piece 5. ONSET: When did the boil start?     A couple of months ago 6. PAIN: Is there any pain? If Yes, ask: How bad is the pain?   (Scale 1-10; or mild, moderate, severe)     Moderate  7. FEVER: Do you have a fever? If Yes, ask: What is it, how was it measured, and when did it start?      No 8. SOURCE: Have you been around anyone with boils or other Staph infections? Have you ever had boils before?     Unsure  9. OTHER SYMPTOMS: Do you have any other symptoms? (e.g., shaking chills, weakness, rash  elsewhere on body)     No  Protocols used: Boil (Skin Abscess)-A-AH

## 2023-11-24 NOTE — Progress Notes (Signed)
 OFFICE VISIT  11/24/2023  CC:  Chief Complaint  Patient presents with   Boil    Located on R side of waistline; present for at least 2 months; odor, painful to touch, hard and about the size of 50 cent piece. Has been applying rubbing alcohol    Patient is a 63 y.o. male who presents for a boil.  HPI: About 2 months ago he noticed a small red spot on the waistband area of the right low back.  Wife squeezed it, white cheesy substance came out.  It was only about the size of a dime at that time. The slight swelling has persisted and in the last week or 2 it has grown and he tried squeezing it again.  A little bit of blood-tinged yellow substance came out but it sealed back up.  He has had no fever.  Recently he has been irritating it by scrubbing it in the shower with a brush.  No history of MRSA.   Past Medical History:  Diagnosis Date   Alcohol abuse    ongoing as of 07/2017.  Quit after CVA 05/2018   Arthritis    Atrial fibrillation (HCC)    Not candidate for anticoagulation b/c of GI bleeding; had to get transfused.  Then, pt had CVA 05/2018 and he was put on xarelto  and he quit drinking.   Coronary atherosclerosis    Noted on lung cancer screening CT   COVID-19 virus infection 06/2019   CVA (cerebral vascular accident) (HCC) 05/2018   large R MCA territory infarct.  CTA neg for large cerebral vessel occlusion.  R ICA 70% stenosis, L clear. Rpt CTA neck ordered by neuro as of 01/2019.   Gastric ulcer    hx of--unclear (pt and wife deny this)   Gout    Usually MTP joint   History of adenomatous polyp of colon 2021   recall 2024   History of digital clubbing    all my adult life--CXR 08/2016 = bronchitic changes o/w normal.   Hyperkalemia 04/2017   Normalized with decreasing lisinopril  from 40 mg qd to 20 mg qd.   Hyperlipidemia 07/2016   Holding off on statin until pt quits drinking.  Back on statin after CVA 05/2018   Hypertension    Nonischemic cardiomyopathy (HCC) 2015;  2020   2015: resolved (w/medical therapy?).  05/2018 EF 35-40%, with TR and MR and ++LAE. 05/2019 EF 50-52%, low normal LV wall motion, grd II DD, mod MR and mod TR, mild pulm HTN.   Nonischemic cardiomyopathy (HCC)    Nonrheumatic mitral valve regurgitation    Osteoarthritis of right hip    Paralysis (HCC)    some left side deficit from CVA in 2020   Pre-diabetes    Screening for lung cancer    04/2021 CT showed no lesions/nodules.  Repeat 1 year   Sleep apnea    Tobacco abuse    Quit 2014    Past Surgical History:  Procedure Laterality Date   CARDIAC CATHETERIZATION  2015   Normal coronaries   CARDIOVERSION     DCCV 2015   carotid dopplers     03/31/23 plaque w/out stenosis.   COLONOSCOPY  08/09/2019   One large adenoma-->Rpt done 10/05/22   TONSILLECTOMY     TOTAL HIP ARTHROPLASTY Right 08/03/2021   Procedure: TOTAL HIP ARTHROPLASTY ANTERIOR APPROACH;  Surgeon: Ernie Cough, MD;  Location: [facility];  Service: Orthopedics;  Laterality: Right;   TRANSTHORACIC ECHOCARDIOGRAM  06/08/2018; 05/2019  05/2018 (in context of acute CVA): EF 35-40%, mild/mod MR, mod/sev TR, severely dilated LA.  05/2019 EF 50-52%, low normal LV wall motion, grd II DD, mod MR and mod TR, mild pulm HTN.   TRANSTHORACIC ECHOCARDIOGRAM     07/06/21 EF 50-55%,normal WM, mild elev RVP, mild MR.    Outpatient Medications Prior to Visit  Medication Sig Dispense Refill   allopurinol  (ZYLOPRIM ) 100 MG tablet TAKE 2 TABLETS BY MOUTH DAILY 180 tablet 1   atorvastatin  (LIPITOR) 40 MG tablet TAKE 1 TABLET BY MOUTH DAILY 90 tablet 1   digoxin  (LANOXIN ) 0.125 MG tablet Take by mouth every other day.     folic acid  (FOLVITE ) 1 MG tablet TAKE 1 TABLET BY MOUTH DAILY 90 tablet 1   lisinopril  (ZESTRIL ) 20 MG tablet Take 1 tablet (20 mg total) by mouth daily. 90 tablet 1   metoprolol  succinate (TOPROL -XL) 25 MG 24 hr tablet Take 1 tablet (25 mg total) by mouth daily. 90 tablet 0   pantoprazole  (PROTONIX ) 40 MG tablet TAKE 1  TABLET BY MOUTH 2 TIMES A DAY 180 tablet 1   polyethylene glycol (MIRALAX  / GLYCOLAX ) 17 g packet Take 17 g by mouth daily as needed for mild constipation. 14 each 0   XARELTO  20 MG TABS tablet Take 1 tablet (20 mg total) by mouth daily with supper. OFFICE VISIT NEEDED FOR FURTHER REFILLS 30 tablet 0   No facility-administered medications prior to visit.    No Known Allergies  Review of Systems  As per HPI  PE:    11/24/2023    3:28 PM 05/08/2023    8:51 AM 11/07/2022    8:27 AM  Vitals with BMI  Height 5' 7 5' 7 5' 7  Weight 170 lbs 10 oz 186 lbs 10 oz 191 lbs 6 oz  BMI 26.71 29.22 29.97  Systolic 119 130 865  Diastolic 79 71 75  Pulse 63 58 55    Physical Exam  General: Alert and well-appearing. Right low back at the waistline level has a violaceous/pink subcu nodule that feels moderately indurated, 3 cm diameter.  Punctate drainage area sealed up.  He has about a 1 cm area of superficial fluctuance in the center of the lesion. Not significantly tender or warm.   LABS:  Last metabolic panel Lab Results  Component Value Date   GLUCOSE 102 (H) 05/08/2023   NA 140 05/08/2023   K 4.5 05/08/2023   CL 100 05/08/2023   CO2 34 (H) 05/08/2023   BUN 13 05/08/2023   CREATININE 0.95 05/08/2023   GFR 85.27 05/08/2023   CALCIUM  9.6 05/08/2023   PHOS 2.9 06/13/2018   PROT 7.3 11/07/2022   ALBUMIN 4.6 07/22/2021   BILITOT 0.8 11/07/2022   ALKPHOS 79 07/22/2021   AST 17 11/07/2022   ALT 13 11/07/2022   ANIONGAP 10 08/04/2021   Last hemoglobin A1c Lab Results  Component Value Date   HGBA1C 5.7 (A) 05/08/2023   HGBA1C 5.7 05/08/2023   HGBA1C 5.7 05/08/2023   HGBA1C 5.7 05/08/2023   IMPRESSION AND PLAN:  Right low back epidermal inclusion cyst, possibly infected.  Would not benefit from incision and drainage at this time. Hot compress/heating pad 20 minutes 3 times daily recommended.  If/when it starts to drain he can gently milked/encourage drainage with fingers.   Keep covered.  If not draining and is enlarging or becoming more painful he should return for incision and drainage. Keflex 500 mg 3 times daily x 7 days prescribed  today.  Do for CPE--> scheduled for 12/12/2023  An After Visit Summary was printed and given to the patient.  FOLLOW UP: Return for Keep appointment set for 12/12/23 for cpe.  Signed:  Gerlene Hockey, MD           11/24/2023

## 2023-11-29 ENCOUNTER — Other Ambulatory Visit: Payer: Self-pay | Admitting: Family Medicine

## 2023-12-06 ENCOUNTER — Ambulatory Visit (INDEPENDENT_AMBULATORY_CARE_PROVIDER_SITE_OTHER): Admitting: *Deleted

## 2023-12-06 VITALS — Ht 69.0 in | Wt 170.0 lb

## 2023-12-06 DIAGNOSIS — Z0000 Encounter for general adult medical examination without abnormal findings: Secondary | ICD-10-CM | POA: Diagnosis not present

## 2023-12-06 NOTE — Progress Notes (Signed)
 Chief Complaint  Patient presents with   Medicare Wellness     Subjective:   Robert [name] is a 63 y.o. male who presents for a Medicare Annual Wellness Visit.  Allergies (verified) Patient has no known allergies.   History: Past Medical History:  Diagnosis Date   Alcohol abuse    ongoing as of 07/2017.  Quit after CVA 05/2018   Arthritis    Atrial fibrillation (HCC)    Not candidate for anticoagulation b/c of GI bleeding; had to get transfused.  Then, pt had CVA 05/2018 and he was put on xarelto  and he quit drinking.   Coronary atherosclerosis    Noted on lung cancer screening CT   COVID-19 virus infection 06/2019   CVA (cerebral vascular accident) (HCC) 05/2018   large R MCA territory infarct.  CTA neg for large cerebral vessel occlusion.  R ICA 70% stenosis, L clear. Rpt CTA neck ordered by neuro as of 01/2019.   Gastric ulcer    hx of--unclear (pt and wife deny this)   Gout    Usually MTP joint   History of adenomatous polyp of colon 2021   recall 2024   History of digital clubbing    all my adult life--CXR 08/2016 = bronchitic changes o/w normal.   Hyperkalemia 04/2017   Normalized with decreasing lisinopril  from 40 mg qd to 20 mg qd.   Hyperlipidemia 07/2016   Holding off on statin until pt quits drinking.  Back on statin after CVA 05/2018   Hypertension    Nonischemic cardiomyopathy (HCC) 2015; 2020   2015: resolved (w/medical therapy?).  05/2018 EF 35-40%, with TR and MR and ++LAE. 05/2019 EF 50-52%, low normal LV wall motion, grd II DD, mod MR and mod TR, mild pulm HTN.   Nonischemic cardiomyopathy (HCC)    Nonrheumatic mitral valve regurgitation    Osteoarthritis of right hip    Paralysis (HCC)    some left side deficit from CVA in 2020   Pre-diabetes    Screening for lung cancer    04/2021 CT showed no lesions/nodules.  Repeat 1 year   Sleep apnea    Tobacco abuse    Quit 2014   Past Surgical History:  Procedure Laterality Date   CARDIAC CATHETERIZATION   2015   Normal coronaries   CARDIOVERSION     DCCV 2015   carotid dopplers     03/31/23 plaque w/out stenosis.   COLONOSCOPY  08/09/2019   One large adenoma-->Rpt done 10/05/22   TONSILLECTOMY     TOTAL HIP ARTHROPLASTY Right 08/03/2021   Procedure: TOTAL HIP ARTHROPLASTY ANTERIOR APPROACH;  Surgeon: Ernie Cough, MD;  Location: [facility];  Service: Orthopedics;  Laterality: Right;   TRANSTHORACIC ECHOCARDIOGRAM  06/08/2018; 05/2019   05/2018 (in context of acute CVA): EF 35-40%, mild/mod MR, mod/sev TR, severely dilated LA.  05/2019 EF 50-52%, low normal LV wall motion, grd II DD, mod MR and mod TR, mild pulm HTN.   TRANSTHORACIC ECHOCARDIOGRAM     07/06/21 EF 50-55%,normal WM, mild elev RVP, mild MR.   Family History  Problem Relation Age of Onset   Diabetes Mother    Congestive Heart Failure Mother    Alcohol abuse Father    Hypertension Father    Stroke Father    Heart disease Paternal Grandfather    Social History   Occupational History   Not on file  Tobacco Use   Smoking status: Former    Current packs/day: 0.00    Average  packs/day: 1 pack/day for 5.0 years (5.0 ttl pk-yrs)    Types: Cigarettes    Start date: 01/11/2008    Quit date: 01/10/2013    Years since quitting: 10.9   Smokeless tobacco: Never  Vaping Use   Vaping status: Former  Substance and Sexual Activity   Alcohol use: Not Currently    Comment: drinks liquor daily   Drug use: No   Sexual activity: Not on file   Tobacco Counseling Counseling given: Not Answered  SDOH Screenings   Food Insecurity: No Food Insecurity (12/06/2023)  Housing: Unknown (12/06/2023)  Transportation Needs: No Transportation Needs (12/06/2023)  Utilities: Not At Risk (12/06/2023)  Alcohol Screen: Low Risk  (04/10/2021)  Depression (PHQ2-9): Low Risk  (12/06/2023)  Financial Resource Strain: Low Risk  (09/18/2023)   Received from [facility]  Physical Activity: Insufficiently Active (12/06/2023)  Social Connections: Moderately  Isolated (12/06/2023)  Stress: No Stress Concern Present (12/06/2023)  Tobacco Use: Medium Risk (11/24/2023)  Health Literacy: Adequate Health Literacy (12/06/2023)   See flowsheets for full screening details  Depression Screen PHQ 2 & 9 Depression Scale- Over the past 2 weeks, how often have you been bothered by any of the following problems? Little interest or pleasure in doing things: 0 Feeling down, depressed, or hopeless (PHQ Adolescent also includes...irritable): 0 PHQ-2 Total Score: 0 Trouble falling or staying asleep, or sleeping too much: 0 Feeling tired or having little energy: 0 Poor appetite or overeating (PHQ Adolescent also includes...weight loss): 0 Feeling bad about yourself - or that you are a failure or have let yourself or your family down: 0 Trouble concentrating on things, such as reading the newspaper or watching television (PHQ Adolescent also includes...like school work): 0 Moving or speaking so slowly that other people could have noticed. Or the opposite - being so fidgety or restless that you have been moving around a lot more than usual: 0 Thoughts that you would be better off dead, or of hurting yourself in some way: 0 PHQ-9 Total Score: 0 If you checked off any problems, how difficult have these problems made it for you to do your work, take care of things at home, or get along with other people?: Not difficult at all     Goals Addressed             This Visit's Progress    Patient Stated       Walk without a cane       Visit info / Clinical Intake: Medicare Wellness Visit Type:: Subsequent Annual Wellness Visit Persons participating in visit:: patient Medicare Wellness Visit Mode:: Telephone If telephone:: video declined Because this visit was a virtual/telehealth visit:: unable to obtan vitals due to lack of equipment If Telephone or Video please confirm:: I connected with the patient using audio enabled telemedicine application and verified  that I am speaking with the correct person using two identifiers; I discussed the limitations of evaluation and management by telemedicine; The patient expressed understanding and agreed to proceed Patient Location:: home Provider Location:: home Information given by:: patient Interpreter Needed?: No Pre-visit prep was completed: no AWV questionnaire completed by patient prior to visit?: no Living arrangements:: lives with spouse/significant other Patient's Overall Health Status Rating: (!) fair Typical amount of pain: none Does pain affect daily life?: no Are you currently prescribed opioids?: no  Dietary Habits and Nutritional Risks How many meals a day?: 2 Eats fruit and vegetables daily?: yes Most meals are obtained by: having others provide food In  the last 2 weeks, have you had any of the following?: none Diabetic:: no  Functional Status Activities of Daily Living (to include ambulation/medication): Independent Ambulation: Independent with device- listed below Home Assistive Devices/Equipment: Cane Medication Administration: Independent Home Management: Independent Manage your own finances?: yes Primary transportation is: family/friends Concerns about vision?: no *vision screening is required for WTM* Concerns about hearing?: no  Fall Screening Falls in the past year?: 0 Number of falls in past year: 0 Was there an injury with Fall?: 0 Fall Risk Category Calculator: 0 Patient Fall Risk Level: Low Fall Risk  Fall Risk Patient at Risk for Falls Due to: No Fall Risks Fall risk Follow up: Falls evaluation completed; Education provided; Falls prevention discussed  Home and Transportation Safety: All rugs have non-skid backing?: yes All stairs or steps have railings?: yes Grab bars in the bathtub or shower?: yes Have non-skid surface in bathtub or shower?: yes Good home lighting?: yes Regular seat belt use?: yes Hospital stays in the last year:: no  Cognitive  Assessment Difficulty concentrating, remembering, or making decisions? : yes Will 6CIT or Mini Cog be Completed: yes What year is it?: 0 points What month is it?: 0 points Give patient an address phrase to remember (5 components): Its very sunny outside today in november About what time is it?: 0 points Count backwards from 20 to 1: 0 points Say the months of the year in reverse: 4 points Repeat the address phrase from earlier: 2 points 6 CIT Score: 6 points  Advance Directives (For Healthcare) Does Patient Have a Medical Advance Directive?: No Would patient like information on creating a medical advance directive?: No - Patient declined  Reviewed/Updated  Reviewed/Updated: Surgical History; Reviewed All (Medical, Surgical, Family, Medications, Allergies, Care Teams, Patient Goals); Family History; Medications; Allergies; Care Teams; Patient Goals; Medical History        Objective:    Today's Vitals   12/06/23 0936  Weight: 170 lb (77.1 kg)  Height: 5' 9 (1.753 m)   Body mass index is 25.1 kg/m.  Current Medications (verified) Outpatient Encounter Medications as of 12/06/2023  Medication Sig   allopurinol  (ZYLOPRIM ) 100 MG tablet TAKE 2 TABLETS BY MOUTH DAILY   atorvastatin  (LIPITOR) 40 MG tablet TAKE 1 TABLET BY MOUTH DAILY   digoxin  (LANOXIN ) 0.125 MG tablet Take by mouth every other day.   folic acid  (FOLVITE ) 1 MG tablet TAKE 1 TABLET BY MOUTH DAILY   lisinopril  (ZESTRIL ) 20 MG tablet Take 1 tablet (20 mg total) by mouth daily.   metoprolol  succinate (TOPROL -XL) 25 MG 24 hr tablet Take 1 tablet (25 mg total) by mouth daily.   pantoprazole  (PROTONIX ) 40 MG tablet TAKE 1 TABLET BY MOUTH 2 TIMES A DAY   polyethylene glycol (MIRALAX  / GLYCOLAX ) 17 g packet Take 17 g by mouth daily as needed for mild constipation.   rivaroxaban  (XARELTO ) 20 MG TABS tablet Take 1 tablet (20 mg total) by mouth daily with supper.   cephALEXin  (KEFLEX ) 500 MG capsule Take 1 capsule (500 mg  total) by mouth 3 (three) times daily.   No facility-administered encounter medications on file as of 12/06/2023.   Hearing/Vision screen Hearing Screening - Comments:: No trouble hearing Vision Screening - Comments:: [facility] Up to date Immunizations and Health Maintenance Health Maintenance  Topic Date Due   COVID-19 Vaccine (1) Never done   Influenza Vaccine  08/11/2023   OPHTHALMOLOGY EXAM  08/11/2023   Diabetic kidney evaluation - Urine ACR  11/07/2023   FOOT  EXAM  11/07/2023   HEMOGLOBIN A1C  11/07/2023   Diabetic kidney evaluation - eGFR measurement  05/07/2024   Medicare Annual Wellness (AWV)  12/05/2024   DTaP/Tdap/Td (2 - Td or Tdap) 11/07/2027   Colonoscopy  10/04/2032   Pneumococcal Vaccine: 50+ Years  Completed   Hepatitis C Screening  Completed   HIV Screening  Completed   Zoster Vaccines- Shingrix   Completed   Hepatitis B Vaccines 19-59 Average Risk  Aged Out   HPV VACCINES  Aged Out   Meningococcal B Vaccine  Aged Out        Assessment/Plan:  This is a routine wellness examination for Merino.  Patient Care Team: Candise Aleene DEL, MD as PCP - General (Family Medicine) Powers, Norleen BROCKS, MD as Consulting Physician (Cardiology) Lindaann Dunnings, MD as Consulting Physician (Gastroenterology) Fredrica Lipoma, MD as Consulting Physician (Cardiology) Oneita Tresea DEL, OD (Optometry) Ernie Cough, MD as Consulting Physician (Orthopedic Surgery)  I have personally reviewed and noted the following in the patient's chart:   Medical and social history Use of alcohol, tobacco or illicit drugs  Current medications and supplements including opioid prescriptions. Functional ability and status Nutritional status Physical activity Advanced directives List of other physicians Hospitalizations, surgeries, and ER visits in previous 12 months Vitals Screenings to include cognitive, depression, and falls Referrals and appointments  No orders of the defined types were  placed in this encounter.  In addition, I have reviewed and discussed with patient certain preventive protocols, quality metrics, and best practice recommendations. A written personalized care plan for preventive services as well as general preventive health recommendations were provided to patient.   Mliss Graff, LPN   [id number]   Return in 1 year (on 12/05/2024).  After Visit Summary: (MyChart) Due to this being a telephonic visit, the after visit summary with patients personalized plan was offered to patient via MyChart   Nurse Notes:     Patient stated had flu shot on 11-24-2023 at visit.  Did not see any documentation

## 2023-12-06 NOTE — Patient Instructions (Signed)
 Mr. Shieh,  Thank you for taking the time for your Medicare Wellness Visit. I appreciate your continued commitment to your health goals. Please review the care plan we discussed, and feel free to reach out if I can assist you further.  Please note that Annual Wellness Visits do not include a physical exam. Some assessments may be limited, especially if the visit was conducted virtually. If needed, we may recommend an in-person follow-up with your provider.  Ongoing Care Seeing your primary care provider every 3 to 6 months helps us  monitor your health and provide consistent, personalized care.   Referrals If a referral was made during today's visit and you haven't received any updates within two weeks, please contact the referred provider directly to check on the status.  Recommended Screenings:  Health Maintenance  Topic Date Due   COVID-19 Vaccine (1) Never done   Flu Shot  08/11/2023   Eye exam for diabetics  08/11/2023   Yearly kidney health urinalysis for diabetes  11/07/2023   Complete foot exam   11/07/2023   Hemoglobin A1C  11/07/2023   Yearly kidney function blood test for diabetes  05/07/2024   Medicare Annual Wellness Visit  12/05/2024   DTaP/Tdap/Td vaccine (2 - Td or Tdap) 11/07/2027   Colon Cancer Screening  10/04/2032   Pneumococcal Vaccine for age over 53  Completed   Hepatitis C Screening  Completed   HIV Screening  Completed   Zoster (Shingles) Vaccine  Completed   Hepatitis B Vaccine  Aged Out   HPV Vaccine  Aged Out   Meningitis B Vaccine  Aged Out       12/06/2023    9:37 AM  Advanced Directives  Does Patient Have a Medical Advance Directive? No  Would patient like information on creating a medical advance directive? No - Patient declined    Vision: Annual vision screenings are recommended for early detection of glaucoma, cataracts, and diabetic retinopathy. These exams can also reveal signs of chronic conditions such as diabetes and high blood  pressure.  Dental: Annual dental screenings help detect early signs of oral cancer, gum disease, and other conditions linked to overall health, including heart disease and diabetes.  Please see the attached documents for additional preventive care recommendations.    Mr. Cwikla , Thank you for taking time to come for your Medicare Wellness Visit. I appreciate your ongoing commitment to your health goals. Please review the following plan we discussed and let me know if I can assist you in the future.   Screening recommendations/referrals: Colonoscopy:  Recommended yearly ophthalmology/optometry visit for glaucoma screening and checkup Recommended yearly dental visit for hygiene and checkup  Vaccinations: Influenza vaccine:  Pneumococcal vaccine:  Tdap vaccine:  Shingles vaccine:     Preventive Care 40-64 Years, Male Preventive care refers to lifestyle choices and visits with your health care provider that can promote health and wellness. What does preventive care include? A yearly physical exam. This is also called an annual well check. Dental exams once or twice a year. Routine eye exams. Ask your health care provider how often you should have your eyes checked. Personal lifestyle choices, including: Daily care of your teeth and gums. Regular physical activity. Eating a healthy diet. Avoiding tobacco and drug use. Limiting alcohol use. Practicing safe sex. Taking low-dose aspirin  every day starting at age 44. What happens during an annual well check? The services and screenings done by your health care provider during your annual well check will depend on  your age, overall health, lifestyle risk factors, and family history of disease. Counseling  Your health care provider may ask you questions about your: Alcohol use. Tobacco use. Drug use. Emotional well-being. Home and relationship well-being. Sexual activity. Eating habits. Work and work astronomer. Screening  You  may have the following tests or measurements: Height, weight, and BMI. Blood pressure. Lipid and cholesterol levels. These may be checked every 5 years, or more frequently if you are over 25 years old. Skin check. Lung cancer screening. You may have this screening every year starting at age 30 if you have a 30-pack-year history of smoking and currently smoke or have quit within the past 15 years. Fecal occult blood test (FOBT) of the stool. You may have this test every year starting at age 17. Flexible sigmoidoscopy or colonoscopy. You may have a sigmoidoscopy every 5 years or a colonoscopy every 10 years starting at age 48. Prostate cancer screening. Recommendations will vary depending on your family history and other risks. Hepatitis C blood test. Hepatitis B blood test. Sexually transmitted disease (STD) testing. Diabetes screening. This is done by checking your blood sugar (glucose) after you have not eaten for a while (fasting). You may have this done every 1-3 years. Discuss your test results, treatment options, and if necessary, the need for more tests with your health care provider. Vaccines  Your health care provider may recommend certain vaccines, such as: Influenza vaccine. This is recommended every year. Tetanus, diphtheria, and acellular pertussis (Tdap, Td) vaccine. You may need a Td booster every 10 years. Zoster vaccine. You may need this after age 65. Pneumococcal 13-valent conjugate (PCV13) vaccine. You may need this if you have certain conditions and have not been vaccinated. Pneumococcal polysaccharide (PPSV23) vaccine. You may need one or two doses if you smoke cigarettes or if you have certain conditions. Talk to your health care provider about which screenings and vaccines you need and how often you need them. This information is not intended to replace advice given to you by your health care provider. Make sure you discuss any questions you have with your health care  provider. Document Released: 01/23/2015 Document Revised: 09/16/2015 Document Reviewed: 10/28/2014 Elsevier Interactive Patient Education  2017 [employer].  Fall Prevention in the Home Falls can cause injuries. They can happen to people of all ages. There are many things you can do to make your home safe and to help prevent falls. What can I do on the outside of my home? Regularly fix the edges of walkways and driveways and fix any cracks. Remove anything that might make you trip as you walk through a door, such as a raised step or threshold. Trim any bushes or trees on the path to your home. Use bright outdoor lighting. Clear any walking paths of anything that might make someone trip, such as rocks or tools. Regularly check to see if handrails are loose or broken. Make sure that both sides of any steps have handrails. Any raised decks and porches should have guardrails on the edges. Have any leaves, snow, or ice cleared regularly. Use sand or salt on walking paths during winter. Clean up any spills in your garage right away. This includes oil or grease spills. What can I do in the bathroom? Use night lights. Install grab bars by the toilet and in the tub and shower. Do not use towel bars as grab bars. Use non-skid mats or decals in the tub or shower. If you need to sit down  in the shower, use a plastic, non-slip stool. Keep the floor dry. Clean up any water that spills on the floor as soon as it happens. Remove soap buildup in the tub or shower regularly. Attach bath mats securely with double-sided non-slip rug tape. Do not have throw rugs and other things on the floor that can make you trip. What can I do in the bedroom? Use night lights. Make sure that you have a light by your bed that is easy to reach. Do not use any sheets or blankets that are too big for your bed. They should not hang down onto the floor. Have a firm chair that has side arms. You can use this for support while  you get dressed. Do not have throw rugs and other things on the floor that can make you trip. What can I do in the kitchen? Clean up any spills right away. Avoid walking on wet floors. Keep items that you use a lot in easy-to-reach places. If you need to reach something above you, use a strong step stool that has a grab bar. Keep electrical cords out of the way. Do not use floor polish or wax that makes floors slippery. If you must use wax, use non-skid floor wax. Do not have throw rugs and other things on the floor that can make you trip. What can I do with my stairs? Do not leave any items on the stairs. Make sure that there are handrails on both sides of the stairs and use them. Fix handrails that are broken or loose. Make sure that handrails are as long as the stairways. Check any carpeting to make sure that it is firmly attached to the stairs. Fix any carpet that is loose or worn. Avoid having throw rugs at the top or bottom of the stairs. If you do have throw rugs, attach them to the floor with carpet tape. Make sure that you have a light switch at the top of the stairs and the bottom of the stairs. If you do not have them, ask someone to add them for you. What else can I do to help prevent falls? Wear shoes that: Do not have high heels. Have rubber bottoms. Are comfortable and fit you well. Are closed at the toe. Do not wear sandals. If you use a stepladder: Make sure that it is fully opened. Do not climb a closed stepladder. Make sure that both sides of the stepladder are locked into place. Ask someone to hold it for you, if possible. Clearly mark and make sure that you can see: Any grab bars or handrails. First and last steps. Where the edge of each step is. Use tools that help you move around (mobility aids) if they are needed. These include: Canes. Walkers. Scooters. Crutches. Turn on the lights when you go into a dark area. Replace any light bulbs as soon as they burn  out. Set up your furniture so you have a clear path. Avoid moving your furniture around. If any of your floors are uneven, fix them. If there are any pets around you, be aware of where they are. Review your medicines with your doctor. Some medicines can make you feel dizzy. This can increase your chance of falling. Ask your doctor what other things that you can do to help prevent falls. This information is not intended to replace advice given to you by your health care provider. Make sure you discuss any questions you have with your health care provider. Document  Released: 10/23/2008 Document Revised: 06/04/2015 Document Reviewed: 01/31/2014 Elsevier Interactive Patient Education  2017 [employer].

## 2023-12-12 ENCOUNTER — Encounter: Payer: Self-pay | Admitting: Family Medicine

## 2023-12-12 ENCOUNTER — Ambulatory Visit: Admitting: Family Medicine

## 2023-12-12 VITALS — BP 104/63 | HR 55 | Temp 97.2°F | Resp 12 | Ht 68.5 in | Wt 174.2 lb

## 2023-12-12 DIAGNOSIS — I482 Chronic atrial fibrillation, unspecified: Secondary | ICD-10-CM

## 2023-12-12 DIAGNOSIS — Z79899 Other long term (current) drug therapy: Secondary | ICD-10-CM

## 2023-12-12 DIAGNOSIS — E7800 Pure hypercholesterolemia, unspecified: Secondary | ICD-10-CM

## 2023-12-12 DIAGNOSIS — Z0000 Encounter for general adult medical examination without abnormal findings: Secondary | ICD-10-CM

## 2023-12-12 DIAGNOSIS — Z87891 Personal history of nicotine dependence: Secondary | ICD-10-CM

## 2023-12-12 DIAGNOSIS — Z125 Encounter for screening for malignant neoplasm of prostate: Secondary | ICD-10-CM

## 2023-12-12 DIAGNOSIS — I10 Essential (primary) hypertension: Secondary | ICD-10-CM

## 2023-12-12 DIAGNOSIS — E119 Type 2 diabetes mellitus without complications: Secondary | ICD-10-CM

## 2023-12-12 LAB — CBC WITH DIFFERENTIAL/PLATELET
Basophils Absolute: 0 K/uL (ref 0.0–0.1)
Basophils Relative: 0.4 % (ref 0.0–3.0)
Eosinophils Absolute: 0 K/uL (ref 0.0–0.7)
Eosinophils Relative: 0.9 % (ref 0.0–5.0)
HCT: 40.3 % (ref 39.0–52.0)
Hemoglobin: 12.9 g/dL — ABNORMAL LOW (ref 13.0–17.0)
Lymphocytes Relative: 40.6 % (ref 12.0–46.0)
Lymphs Abs: 2 K/uL (ref 0.7–4.0)
MCHC: 32.1 g/dL (ref 30.0–36.0)
MCV: 89.8 fl (ref 78.0–100.0)
Monocytes Absolute: 0.6 K/uL (ref 0.1–1.0)
Monocytes Relative: 12.5 % — ABNORMAL HIGH (ref 3.0–12.0)
Neutro Abs: 2.2 K/uL (ref 1.4–7.7)
Neutrophils Relative %: 45.6 % (ref 43.0–77.0)
Platelets: 188 K/uL (ref 150.0–400.0)
RBC: 4.49 Mil/uL (ref 4.22–5.81)
RDW: 15.2 % (ref 11.5–15.5)
WBC: 4.9 K/uL (ref 4.0–10.5)

## 2023-12-12 LAB — COMPREHENSIVE METABOLIC PANEL WITH GFR
ALT: 15 U/L (ref 0–53)
AST: 18 U/L (ref 0–37)
Albumin: 4.7 g/dL (ref 3.5–5.2)
Alkaline Phosphatase: 70 U/L (ref 39–117)
BUN: 23 mg/dL (ref 6–23)
CO2: 35 meq/L — ABNORMAL HIGH (ref 19–32)
Calcium: 9.7 mg/dL (ref 8.4–10.5)
Chloride: 100 meq/L (ref 96–112)
Creatinine, Ser: 0.88 mg/dL (ref 0.40–1.50)
GFR: 91.22 mL/min (ref >60.00)
Glucose, Bld: 111 mg/dL — ABNORMAL HIGH (ref 70–99)
Potassium: 4.7 meq/L (ref 3.5–5.1)
Sodium: 140 meq/L (ref 135–145)
Total Bilirubin: 0.8 mg/dL (ref 0.2–1.2)
Total Protein: 7 g/dL (ref 6.0–8.3)

## 2023-12-12 LAB — POCT GLYCOSYLATED HEMOGLOBIN (HGB A1C)
HbA1c POC (<> result, manual entry): 5.8 % (ref 4.0–5.6)
HbA1c, POC (controlled diabetic range): 5.8 % (ref 0.0–7.0)
HbA1c, POC (prediabetic range): 5.8 % (ref 5.7–6.4)
Hemoglobin A1C: 5.8 % — AB (ref 4.0–5.6)

## 2023-12-12 LAB — LIPID PANEL
Cholesterol: 138 mg/dL (ref 0–200)
HDL: 66 mg/dL (ref >39.00)
LDL Cholesterol: 63 mg/dL (ref 0–99)
NonHDL: 71.52
Total CHOL/HDL Ratio: 2
Triglycerides: 41 mg/dL (ref 0.0–149.0)
VLDL: 8.2 mg/dL (ref 0.0–40.0)

## 2023-12-12 LAB — PSA, MEDICARE: PSA: 2.55 ng/mL (ref 0.10–4.00)

## 2023-12-12 LAB — VITAMIN B12: Vitamin B-12: 371 pg/mL (ref 211–911)

## 2023-12-12 MED ORDER — ALLOPURINOL 100 MG PO TABS: 200.0000 mg | ORAL_TABLET | Freq: Every day | ORAL | 3 refills | Status: AC

## 2023-12-12 MED ORDER — LISINOPRIL 20 MG PO TABS: 20.0000 mg | ORAL_TABLET | Freq: Every day | ORAL | 3 refills | Status: AC

## 2023-12-12 MED ORDER — PANTOPRAZOLE SODIUM 40 MG PO TBEC: 40.0000 mg | DELAYED_RELEASE_TABLET | Freq: Two times a day (BID) | ORAL | 3 refills | Status: AC

## 2023-12-12 MED ORDER — FOLIC ACID 1 MG PO TABS: 1.0000 mg | ORAL_TABLET | Freq: Every day | ORAL | 3 refills | Status: AC

## 2023-12-12 NOTE — Progress Notes (Signed)
 Office Note 12/12/2023  CC:  Chief Complaint  Patient presents with   Annual Exam    Patient is a 63 y.o. male who is here for annual health maintenance exam and 10-month follow-up diabetes, hypertension, and hypercholesterolemia. A/P as of last visit: 1 diabetes without complication (dx'd by fasting gluc criteria.  Max A1c 6.3%) , well-controlled without medication. POC Hba1c 5.7% today.   2 hypertension, well controlled on Toprol -XL 25 mg a day and lisinopril  20 mg a day. Electrolytes and creatinine monitoring today.   3.  Hypercholesterolemia, doing well on atorvastatin  40 mg a day. Lipid panel today.   4. Chronic atrial fibrillation. Stable/rate controlled on digoxin  0.125 mg every other day and Toprol -XL 25 mg a day.  Stroke prophylaxis with Xarelto  20 mg a day.  INTERIM HX: Feeling well. No concerns. No home bp or gluc monitoring.   Past Medical History:  Diagnosis Date   Alcohol abuse    ongoing as of 07/2017.  Quit after CVA 05/2018   Arthritis    Atrial fibrillation (HCC)    Not candidate for anticoagulation b/c of GI bleeding; had to get transfused.  Then, pt had CVA 05/2018 and he was put on xarelto  and he quit drinking.   Coronary atherosclerosis    Noted on lung cancer screening CT   COVID-19 virus infection 06/2019   CVA (cerebral vascular accident) (HCC) 05/2018   large R MCA territory infarct.  CTA neg for large cerebral vessel occlusion.  R ICA 70% stenosis, L clear. Rpt CTA neck ordered by neuro as of 01/2019.   Gastric ulcer    hx of--unclear (pt and wife deny this)   Gout    Usually MTP joint   History of adenomatous polyp of colon 2021   recall 2024   History of digital clubbing    all my adult life--CXR 08/2016 = bronchitic changes o/w normal.   Hyperkalemia 04/2017   Normalized with decreasing lisinopril  from 40 mg qd to 20 mg qd.   Hyperlipidemia 07/2016   Holding off on statin until pt quits drinking.  Back on statin after CVA 05/2018    Hypertension    Nonischemic cardiomyopathy (HCC) 2015; 2020   2015: resolved (w/medical therapy?).  05/2018 EF 35-40%, with TR and MR and ++LAE. 05/2019 EF 50-52%, low normal LV wall motion, grd II DD, mod MR and mod TR, mild pulm HTN.   Nonischemic cardiomyopathy (HCC)    Nonrheumatic mitral valve regurgitation    Osteoarthritis of right hip    Paralysis (HCC)    some left side deficit from CVA in 2020   Pre-diabetes    Screening for lung cancer    04/2021 CT showed no lesions/nodules.  Repeat 1 year   Sleep apnea    Tobacco abuse    Quit 2014    Past Surgical History:  Procedure Laterality Date   CARDIAC CATHETERIZATION  2015   Normal coronaries   CARDIOVERSION     DCCV 2015   carotid dopplers     03/31/23 plaque w/out stenosis.   COLONOSCOPY  08/09/2019   One large adenoma-->Rpt done 10/05/22   TONSILLECTOMY     TOTAL HIP ARTHROPLASTY Right 08/03/2021   Procedure: TOTAL HIP ARTHROPLASTY ANTERIOR APPROACH;  Surgeon: Ernie Cough, MD;  Location: [facility];  Service: Orthopedics;  Laterality: Right;   TRANSTHORACIC ECHOCARDIOGRAM  06/08/2018; 05/2019   05/2018 (in context of acute CVA): EF 35-40%, mild/mod MR, mod/sev TR, severely dilated LA.  05/2019 EF 50-52%, low normal  LV wall motion, grd II DD, mod MR and mod TR, mild pulm HTN.   TRANSTHORACIC ECHOCARDIOGRAM     07/06/21 EF 50-55%,normal WM, mild elev RVP, mild MR.    Family History  Problem Relation Age of Onset   Diabetes Mother    Congestive Heart Failure Mother    Alcohol abuse Father    Hypertension Father    Stroke Father    Heart disease Paternal Grandfather     Social History   Socioeconomic History   Marital status: Married    Spouse name: Not on file   Number of children: Not on file   Years of education: Not on file   Highest education level: Not on file  Occupational History   Not on file  Tobacco Use   Smoking status: Former    Current packs/day: 0.00    Average packs/day: 1 pack/day for 5.0 years  (5.0 ttl pk-yrs)    Types: Cigarettes    Start date: 01/11/2008    Quit date: 01/10/2013    Years since quitting: 10.9   Smokeless tobacco: Never  Vaping Use   Vaping status: Former  Substance and Sexual Activity   Alcohol use: Not Currently    Comment: drinks liquor daily   Drug use: No   Sexual activity: Not on file  Other Topics Concern   Not on file  Social History Narrative   Married, 2 adult children.   Educ: 11th grade   Occup: out of work.  Worked for [employer]--was let go for not abiding by [employer] per pt/wife.   Tob: former--quit 2014.  Of and on prior.   Alc: long hx of drinking a pint and 1 and 1/2 pint of whiskey per day x years---quit approx May 2020.SABRA   Drugs: none.   Social Drivers of Corporate Investment Banker Strain: Low Risk  (09/18/2023)   Received from [facility]   Overall Financial Resource Strain (CARDIA)    How hard is it for you to pay for the very basics like food, housing, medical care, and heating?: Not hard at all  Food Insecurity: No Food Insecurity (12/06/2023)   Hunger Vital Sign    Worried About Running Out of Food in the Last Year: Never true    Ran Out of Food in the Last Year: Never true  Transportation Needs: No Transportation Needs (12/06/2023)   PRAPARE - Administrator, Civil Service (Medical): No    Lack of Transportation (Non-Medical): No  Physical Activity: Insufficiently Active (12/06/2023)   Exercise Vital Sign    Days of Exercise per Week: 2 days    Minutes of Exercise per Session: 30 min  Stress: No Stress Concern Present (12/06/2023)   [employer] of Occupational Health - Occupational Stress Questionnaire    Feeling of Stress: Not at all  Social Connections: Moderately Isolated (12/06/2023)   Social Connection and Isolation Panel    Frequency of Communication with Friends and Family: Three times a week    Frequency of Social Gatherings with Friends and Family: Twice a week    Attends  Religious Services: Never    Database Administrator or Organizations: No    Attends Banker Meetings: Never    Marital Status: Married  Catering Manager Violence: Not At Risk (12/06/2023)   Humiliation, Afraid, Rape, and Kick questionnaire    Fear of Current or Ex-Partner: No    Emotionally Abused: No    Physically Abused: No  Sexually Abused: No    Outpatient Medications Prior to Visit  Medication Sig Dispense Refill   allopurinol  (ZYLOPRIM ) 100 MG tablet TAKE 2 TABLETS BY MOUTH DAILY 180 tablet 1   atorvastatin  (LIPITOR) 40 MG tablet TAKE 1 TABLET BY MOUTH DAILY 90 tablet 1   digoxin  (LANOXIN ) 0.125 MG tablet Take by mouth every other day.     folic acid  (FOLVITE ) 1 MG tablet TAKE 1 TABLET BY MOUTH DAILY 90 tablet 1   lisinopril  (ZESTRIL ) 20 MG tablet Take 1 tablet (20 mg total) by mouth daily. 90 tablet 1   metoprolol  succinate (TOPROL -XL) 25 MG 24 hr tablet Take 1 tablet (25 mg total) by mouth daily. 90 tablet 0   pantoprazole  (PROTONIX ) 40 MG tablet TAKE 1 TABLET BY MOUTH 2 TIMES A DAY 180 tablet 1   polyethylene glycol (MIRALAX  / GLYCOLAX ) 17 g packet Take 17 g by mouth daily as needed for mild constipation. 14 each 0   rivaroxaban  (XARELTO ) 20 MG TABS tablet Take 1 tablet (20 mg total) by mouth daily with supper. 90 tablet 0   cephALEXin  (KEFLEX ) 500 MG capsule Take 1 capsule (500 mg total) by mouth 3 (three) times daily. (Patient not taking: Reported on 12/12/2023) 21 capsule 0   No facility-administered medications prior to visit.    No Known Allergies  Review of Systems  Constitutional:  Negative for appetite change, chills, fatigue and fever.  HENT:  Negative for congestion, dental problem, ear pain and sore throat.   Eyes:  Negative for discharge, redness and visual disturbance.  Respiratory:  Negative for cough, chest tightness, shortness of breath and wheezing.   Cardiovascular:  Negative for chest pain, palpitations and leg swelling.  Gastrointestinal:   Negative for abdominal pain, blood in stool, diarrhea, nausea and vomiting.  Genitourinary:  Negative for difficulty urinating, dysuria, flank pain, frequency, hematuria and urgency.  Musculoskeletal:  Negative for arthralgias, back pain, joint swelling, myalgias and neck stiffness.  Skin:  Negative for pallor and rash.  Neurological:  Negative for dizziness, speech difficulty, weakness and headaches.  Hematological:  Negative for adenopathy. Does not bruise/bleed easily.  Psychiatric/Behavioral:  Negative for confusion and sleep disturbance. The patient is not nervous/anxious.     PE;    12/12/2023    8:30 AM 12/06/2023    9:36 AM 11/24/2023    3:28 PM  Vitals with BMI  Height 5' 8.5 5' 9 5' 7  Weight 174 lbs 3 oz 170 lbs 170 lbs 10 oz  BMI 26.1 25.09 26.71  Systolic 104  119  Diastolic 63  79  Pulse 55  63     Gen: Alert, well appearing.  Patient is oriented to person, place, time, and situation. AFFECT: pleasant, lucid thought and speech. ENT: Ears: EACs clear, normal epithelium.  TMs with good light reflex and landmarks bilaterally.  Eyes: no injection, icteris, swelling, or exudate.  EOMI, PERRLA. Nose: no drainage or turbinate edema/swelling.  No injection or focal lesion.  Mouth: lips without lesion/swelling.  Oral mucosa pink and moist.  Dentition intact and without obvious caries or gingival swelling.  Oropharynx without erythema, exudate, or swelling.  Neck: supple/nontender.  No LAD, mass, or TM.  Carotid pulses 2+ bilaterally, without bruits. CV: RRR, no m/r/g.   LUNGS: CTA bilat, nonlabored resps, good aeration in all lung fields. ABD: soft, NT, ND, BS normal.  No hepatospenomegaly or mass.  No bruits. EXT: no clubbing, cyanosis, or edema.  Musculoskeletal: no joint swelling, erythema, warmth, or tenderness.  ROM of all joints intact. Skin - no sores or suspicious lesions or rashes or color changes Foot exam -  no swelling, tenderness or skin or vascular lesions.  Color and temperature is normal. Sensation is intact. Peripheral pulses are palpable. Toenails are normal.  Pertinent labs:  Lab Results  Component Value Date   TSH 1.20 05/15/2020   Lab Results  Component Value Date   WBC 5.8 11/07/2022   HGB 12.4 (L) 11/07/2022   HCT 40.0 11/07/2022   MCV 88.1 11/07/2022   PLT 225 11/07/2022   Lab Results  Component Value Date   CREATININE 0.95 05/08/2023   BUN 13 05/08/2023   NA 140 05/08/2023   K 4.5 05/08/2023   CL 100 05/08/2023   CO2 34 (H) 05/08/2023   Lab Results  Component Value Date   ALT 13 11/07/2022   AST 17 11/07/2022   ALKPHOS 79 07/22/2021   BILITOT 0.8 11/07/2022   Lab Results  Component Value Date   CHOL 129 05/08/2023   Lab Results  Component Value Date   HDL 63.30 05/08/2023   Lab Results  Component Value Date   LDLCALC 56 05/08/2023   Lab Results  Component Value Date   TRIG 51.0 05/08/2023   Lab Results  Component Value Date   CHOLHDL 2 05/08/2023   Lab Results  Component Value Date   PSA 2.89 11/07/2022   PSA 2.99 10/18/2021   PSA 2.09 05/15/2020   Lab Results  Component Value Date   HGBA1C 5.8 (A) 12/12/2023   HGBA1C 5.8 12/12/2023   HGBA1C 5.8 12/12/2023   HGBA1C 5.8 12/12/2023   Lab Results  Component Value Date   VITAMINB12 576 06/29/2019   ASSESSMENT AND PLAN:   #1 health maintenance exam: Reviewed age and gender appropriate health maintenance issues (prudent diet, regular exercise, health risks of tobacco and excessive alcohol, use of seatbelts, fire alarms in home, use of sunscreen).  Also reviewed age and gender appropriate health screening as well as vaccine recommendations. Vaccines: Flu-->UTD. Labs: HP, a1c, psa, urine microalbumin/creatinine Prostate ca screening: PSA today. Colon ca screening: UTD as of 09/2022.  Recall 2029 Lung cancer screening: Lung RADS 1 in April 2023--> repeat CT ordered today.  2 diabetes without complication (dx'd by fasting gluc criteria.  Max  A1c 6.3%) , well-controlled without medication. POC Hba1c 5.8% today.   3 hypertension, well controlled on Toprol -XL 25 mg a day and lisinopril  20 mg a day. Electrolytes and creatinine monitoring today.   4.  Hypercholesterolemia, doing well on atorvastatin  40 mg a day. Lipid panel today.   5. Chronic atrial fibrillation. Stable/rate controlled on digoxin  0.125 mg every other day and Toprol -XL 25 mg a day.  Stroke prophylaxis with Xarelto  20 mg a day. CBC today.  An After Visit Summary was printed and given to the patient.  FOLLOW UP:  No follow-ups on file.  Signed:  Gerlene Hockey, MD           12/12/2023

## 2023-12-12 NOTE — Addendum Note (Signed)
 Addended by: FLETA CARE D on: 12/12/2023 08:34 AM   Modules accepted: Orders

## 2023-12-12 NOTE — Patient Instructions (Signed)

## 2023-12-13 ENCOUNTER — Other Ambulatory Visit: Payer: Self-pay | Admitting: Family Medicine

## 2023-12-13 LAB — MICROALBUMIN / CREATININE URINE RATIO
Creatinine,U: 74.2 mg/dL
Microalb Creat Ratio: UNDETERMINED mg/g (ref 0.0–30.0)
Microalb, Ur: <0.7 mg/dL

## 2023-12-14 ENCOUNTER — Ambulatory Visit: Payer: Self-pay | Admitting: Family Medicine

## 2023-12-19 ENCOUNTER — Ambulatory Visit

## 2024-01-01 ENCOUNTER — Ambulatory Visit

## 2024-01-01 DIAGNOSIS — Z122 Encounter for screening for malignant neoplasm of respiratory organs: Secondary | ICD-10-CM | POA: Diagnosis not present

## 2024-01-01 DIAGNOSIS — Z87891 Personal history of nicotine dependence: Secondary | ICD-10-CM

## 2024-01-12 ENCOUNTER — Encounter: Payer: Self-pay | Admitting: Family Medicine

## 2024-01-12 NOTE — Progress Notes (Signed)
 No further action needed.

## 2024-01-29 ENCOUNTER — Other Ambulatory Visit: Payer: Self-pay | Admitting: Family Medicine

## 2024-06-11 ENCOUNTER — Ambulatory Visit: Admitting: Family Medicine
